# Patient Record
Sex: Female | Born: 1960 | Race: Black or African American | Hispanic: No | Marital: Single | Smoking: Never smoker
Health system: Southern US, Community
[De-identification: ages and names within clinical notes are randomized; demographics above are authoritative.]

## PROBLEM LIST (undated history)

## (undated) DIAGNOSIS — M5030 Other cervical disc degeneration, unspecified cervical region: Secondary | ICD-10-CM

## (undated) DIAGNOSIS — N186 End stage renal disease: Secondary | ICD-10-CM

## (undated) DIAGNOSIS — E538 Deficiency of other specified B group vitamins: Secondary | ICD-10-CM

## (undated) DIAGNOSIS — K219 Gastro-esophageal reflux disease without esophagitis: Secondary | ICD-10-CM

## (undated) DIAGNOSIS — I472 Ventricular tachycardia: Secondary | ICD-10-CM

## (undated) DIAGNOSIS — E1140 Type 2 diabetes mellitus with diabetic neuropathy, unspecified: Secondary | ICD-10-CM

## (undated) DIAGNOSIS — I739 Peripheral vascular disease, unspecified: Secondary | ICD-10-CM

## (undated) DIAGNOSIS — I4729 Other ventricular tachycardia: Secondary | ICD-10-CM

## (undated) DIAGNOSIS — G4733 Obstructive sleep apnea (adult) (pediatric): Secondary | ICD-10-CM

## (undated) DIAGNOSIS — M51379 Other intervertebral disc degeneration, lumbosacral region without mention of lumbar back pain or lower extremity pain: Secondary | ICD-10-CM

## (undated) DIAGNOSIS — G5620 Lesion of ulnar nerve, unspecified upper limb: Secondary | ICD-10-CM

## (undated) DIAGNOSIS — E119 Type 2 diabetes mellitus without complications: Secondary | ICD-10-CM

## (undated) DIAGNOSIS — M5137 Other intervertebral disc degeneration, lumbosacral region: Secondary | ICD-10-CM

## (undated) DIAGNOSIS — I639 Cerebral infarction, unspecified: Secondary | ICD-10-CM

## (undated) DIAGNOSIS — Z992 Dependence on renal dialysis: Secondary | ICD-10-CM

---

## 2014-05-04 DIAGNOSIS — E039 Hypothyroidism, unspecified: Secondary | ICD-10-CM | POA: Insufficient documentation

## 2014-05-04 DIAGNOSIS — E1069 Type 1 diabetes mellitus with other specified complication: Secondary | ICD-10-CM | POA: Insufficient documentation

## 2014-05-04 DIAGNOSIS — E669 Obesity, unspecified: Secondary | ICD-10-CM | POA: Insufficient documentation

## 2014-05-04 DIAGNOSIS — I10 Essential (primary) hypertension: Secondary | ICD-10-CM | POA: Insufficient documentation

## 2014-05-04 DIAGNOSIS — E785 Hyperlipidemia, unspecified: Secondary | ICD-10-CM | POA: Insufficient documentation

## 2014-05-04 DIAGNOSIS — N184 Chronic kidney disease, stage 4 (severe): Secondary | ICD-10-CM | POA: Insufficient documentation

## 2017-06-25 DIAGNOSIS — J984 Other disorders of lung: Secondary | ICD-10-CM

## 2017-06-25 DIAGNOSIS — J189 Pneumonia, unspecified organism: Secondary | ICD-10-CM

## 2017-06-25 HISTORY — DX: Other disorders of lung: J98.4

## 2017-06-25 HISTORY — DX: Pneumonia, unspecified organism: J18.9

## 2018-11-21 DIAGNOSIS — G4733 Obstructive sleep apnea (adult) (pediatric): Secondary | ICD-10-CM | POA: Insufficient documentation

## 2018-12-19 NOTE — Telephone Encounter (Signed)
 CB list for colon on 7/30.   Electronically signed by: Clem Norleen Sero, MD 12/19/18 [phone]

## 2019-09-25 DIAGNOSIS — E119 Type 2 diabetes mellitus without complications: Secondary | ICD-10-CM | POA: Insufficient documentation

## 2020-07-11 ENCOUNTER — Encounter (HOSPITAL_COMMUNITY): Payer: Self-pay | Admitting: Neurology

## 2020-07-11 ENCOUNTER — Emergency Department (HOSPITAL_COMMUNITY): Payer: Medicare Other

## 2020-07-11 ENCOUNTER — Encounter (HOSPITAL_COMMUNITY)
Admission: EM | Disposition: A | Discharge status: Rehabilitation Facility | Payer: Self-pay | Source: Home / Self Care | Attending: Neurology

## 2020-07-11 ENCOUNTER — Emergency Department (HOSPITAL_COMMUNITY): Payer: Medicare Other | Admitting: Anesthesiology

## 2020-07-11 ENCOUNTER — Inpatient Hospital Stay (HOSPITAL_COMMUNITY)
Admission: EM | Admit: 2020-07-11 | Discharge: 2020-07-18 | DRG: 023 | Disposition: A | Discharge status: Rehabilitation Facility | Payer: Medicare Other | Attending: Neurology | Admitting: Neurology

## 2020-07-11 DIAGNOSIS — E1169 Type 2 diabetes mellitus with other specified complication: Secondary | ICD-10-CM | POA: Diagnosis not present

## 2020-07-11 DIAGNOSIS — E1151 Type 2 diabetes mellitus with diabetic peripheral angiopathy without gangrene: Secondary | ICD-10-CM | POA: Diagnosis present

## 2020-07-11 DIAGNOSIS — Z20822 Contact with and (suspected) exposure to covid-19: Secondary | ICD-10-CM | POA: Diagnosis present

## 2020-07-11 DIAGNOSIS — R75 Inconclusive laboratory evidence of human immunodeficiency virus [HIV]: Secondary | ICD-10-CM | POA: Diagnosis not present

## 2020-07-11 DIAGNOSIS — I63432 Cerebral infarction due to embolism of left posterior cerebral artery: Secondary | ICD-10-CM | POA: Diagnosis present

## 2020-07-11 DIAGNOSIS — E11649 Type 2 diabetes mellitus with hypoglycemia without coma: Secondary | ICD-10-CM | POA: Diagnosis not present

## 2020-07-11 DIAGNOSIS — E669 Obesity, unspecified: Secondary | ICD-10-CM | POA: Diagnosis not present

## 2020-07-11 DIAGNOSIS — I69391 Dysphagia following cerebral infarction: Secondary | ICD-10-CM | POA: Diagnosis not present

## 2020-07-11 DIAGNOSIS — G4733 Obstructive sleep apnea (adult) (pediatric): Secondary | ICD-10-CM | POA: Diagnosis present

## 2020-07-11 DIAGNOSIS — I63512 Cerebral infarction due to unspecified occlusion or stenosis of left middle cerebral artery: Secondary | ICD-10-CM | POA: Diagnosis not present

## 2020-07-11 DIAGNOSIS — I639 Cerebral infarction, unspecified: Secondary | ICD-10-CM

## 2020-07-11 DIAGNOSIS — I619 Nontraumatic intracerebral hemorrhage, unspecified: Secondary | ICD-10-CM | POA: Diagnosis not present

## 2020-07-11 DIAGNOSIS — E1122 Type 2 diabetes mellitus with diabetic chronic kidney disease: Secondary | ICD-10-CM | POA: Diagnosis present

## 2020-07-11 DIAGNOSIS — G8191 Hemiplegia, unspecified affecting right dominant side: Secondary | ICD-10-CM | POA: Diagnosis present

## 2020-07-11 DIAGNOSIS — I63132 Cerebral infarction due to embolism of left carotid artery: Secondary | ICD-10-CM | POA: Diagnosis not present

## 2020-07-11 DIAGNOSIS — B370 Candidal stomatitis: Secondary | ICD-10-CM | POA: Diagnosis present

## 2020-07-11 DIAGNOSIS — H5462 Unqualified visual loss, left eye, normal vision right eye: Secondary | ICD-10-CM | POA: Diagnosis present

## 2020-07-11 DIAGNOSIS — D649 Anemia, unspecified: Secondary | ICD-10-CM | POA: Diagnosis present

## 2020-07-11 DIAGNOSIS — E039 Hypothyroidism, unspecified: Secondary | ICD-10-CM | POA: Diagnosis present

## 2020-07-11 DIAGNOSIS — I4892 Unspecified atrial flutter: Secondary | ICD-10-CM | POA: Diagnosis present

## 2020-07-11 DIAGNOSIS — E538 Deficiency of other specified B group vitamins: Secondary | ICD-10-CM | POA: Diagnosis present

## 2020-07-11 DIAGNOSIS — I69351 Hemiplegia and hemiparesis following cerebral infarction affecting right dominant side: Secondary | ICD-10-CM | POA: Diagnosis not present

## 2020-07-11 DIAGNOSIS — R29810 Facial weakness: Secondary | ICD-10-CM | POA: Diagnosis present

## 2020-07-11 DIAGNOSIS — Z1389 Encounter for screening for other disorder: Secondary | ICD-10-CM

## 2020-07-11 DIAGNOSIS — E1142 Type 2 diabetes mellitus with diabetic polyneuropathy: Secondary | ICD-10-CM | POA: Diagnosis present

## 2020-07-11 DIAGNOSIS — Z950 Presence of cardiac pacemaker: Secondary | ICD-10-CM | POA: Diagnosis not present

## 2020-07-11 DIAGNOSIS — E875 Hyperkalemia: Secondary | ICD-10-CM | POA: Diagnosis present

## 2020-07-11 DIAGNOSIS — I63412 Cerebral infarction due to embolism of left middle cerebral artery: Secondary | ICD-10-CM | POA: Diagnosis present

## 2020-07-11 DIAGNOSIS — N2581 Secondary hyperparathyroidism of renal origin: Secondary | ICD-10-CM | POA: Diagnosis present

## 2020-07-11 DIAGNOSIS — Z8249 Family history of ischemic heart disease and other diseases of the circulatory system: Secondary | ICD-10-CM | POA: Diagnosis not present

## 2020-07-11 DIAGNOSIS — Z794 Long term (current) use of insulin: Secondary | ICD-10-CM

## 2020-07-11 DIAGNOSIS — R471 Dysarthria and anarthria: Secondary | ICD-10-CM | POA: Diagnosis present

## 2020-07-11 DIAGNOSIS — I6340 Cerebral infarction due to embolism of unspecified cerebral artery: Secondary | ICD-10-CM

## 2020-07-11 DIAGNOSIS — Z823 Family history of stroke: Secondary | ICD-10-CM

## 2020-07-11 DIAGNOSIS — Z87891 Personal history of nicotine dependence: Secondary | ICD-10-CM | POA: Diagnosis not present

## 2020-07-11 DIAGNOSIS — R404 Transient alteration of awareness: Secondary | ICD-10-CM | POA: Diagnosis not present

## 2020-07-11 DIAGNOSIS — Z8659 Personal history of other mental and behavioral disorders: Secondary | ICD-10-CM | POA: Diagnosis not present

## 2020-07-11 DIAGNOSIS — I611 Nontraumatic intracerebral hemorrhage in hemisphere, cortical: Secondary | ICD-10-CM | POA: Diagnosis not present

## 2020-07-11 DIAGNOSIS — R001 Bradycardia, unspecified: Secondary | ICD-10-CM | POA: Diagnosis not present

## 2020-07-11 DIAGNOSIS — R4701 Aphasia: Secondary | ICD-10-CM | POA: Diagnosis present

## 2020-07-11 DIAGNOSIS — E871 Hypo-osmolality and hyponatremia: Secondary | ICD-10-CM | POA: Diagnosis present

## 2020-07-11 DIAGNOSIS — Z6839 Body mass index (BMI) 39.0-39.9, adult: Secondary | ICD-10-CM | POA: Diagnosis not present

## 2020-07-11 DIAGNOSIS — I69320 Aphasia following cerebral infarction: Secondary | ICD-10-CM | POA: Diagnosis present

## 2020-07-11 DIAGNOSIS — Z0189 Encounter for other specified special examinations: Secondary | ICD-10-CM

## 2020-07-11 DIAGNOSIS — R1310 Dysphagia, unspecified: Secondary | ICD-10-CM | POA: Diagnosis present

## 2020-07-11 DIAGNOSIS — N186 End stage renal disease: Secondary | ICD-10-CM | POA: Diagnosis present

## 2020-07-11 DIAGNOSIS — E785 Hyperlipidemia, unspecified: Secondary | ICD-10-CM | POA: Diagnosis present

## 2020-07-11 DIAGNOSIS — Z23 Encounter for immunization: Secondary | ICD-10-CM | POA: Diagnosis not present

## 2020-07-11 DIAGNOSIS — Z8261 Family history of arthritis: Secondary | ICD-10-CM

## 2020-07-11 DIAGNOSIS — Z79899 Other long term (current) drug therapy: Secondary | ICD-10-CM | POA: Diagnosis not present

## 2020-07-11 DIAGNOSIS — I120 Hypertensive chronic kidney disease with stage 5 chronic kidney disease or end stage renal disease: Secondary | ICD-10-CM | POA: Diagnosis present

## 2020-07-11 DIAGNOSIS — R29724 NIHSS score 24: Secondary | ICD-10-CM | POA: Diagnosis present

## 2020-07-11 DIAGNOSIS — I10 Essential (primary) hypertension: Secondary | ICD-10-CM | POA: Diagnosis not present

## 2020-07-11 DIAGNOSIS — Z992 Dependence on renal dialysis: Secondary | ICD-10-CM

## 2020-07-11 DIAGNOSIS — Z79890 Hormone replacement therapy (postmenopausal): Secondary | ICD-10-CM

## 2020-07-11 DIAGNOSIS — Z6841 Body Mass Index (BMI) 40.0 and over, adult: Secondary | ICD-10-CM | POA: Diagnosis not present

## 2020-07-11 DIAGNOSIS — Z833 Family history of diabetes mellitus: Secondary | ICD-10-CM

## 2020-07-11 DIAGNOSIS — I6389 Other cerebral infarction: Secondary | ICD-10-CM | POA: Diagnosis not present

## 2020-07-11 DIAGNOSIS — I483 Typical atrial flutter: Secondary | ICD-10-CM | POA: Diagnosis not present

## 2020-07-11 DIAGNOSIS — E1165 Type 2 diabetes mellitus with hyperglycemia: Secondary | ICD-10-CM | POA: Diagnosis present

## 2020-07-11 DIAGNOSIS — I495 Sick sinus syndrome: Secondary | ICD-10-CM | POA: Diagnosis present

## 2020-07-11 HISTORY — PX: IR PERCUTANEOUS ART THROMBECTOMY/INFUSION INTRACRANIAL INC DIAG ANGIO: IMG6087

## 2020-07-11 HISTORY — DX: Lesion of ulnar nerve, unspecified upper limb: G56.20

## 2020-07-11 HISTORY — DX: Type 2 diabetes mellitus with diabetic neuropathy, unspecified: E11.40

## 2020-07-11 HISTORY — DX: Gastro-esophageal reflux disease without esophagitis: K21.9

## 2020-07-11 HISTORY — DX: End stage renal disease: N18.6

## 2020-07-11 HISTORY — DX: Dependence on renal dialysis: Z99.2

## 2020-07-11 HISTORY — DX: Other cervical disc degeneration, unspecified cervical region: M50.30

## 2020-07-11 HISTORY — DX: Ventricular tachycardia: I47.2

## 2020-07-11 HISTORY — DX: Other ventricular tachycardia: I47.29

## 2020-07-11 HISTORY — DX: Type 2 diabetes mellitus without complications: E11.9

## 2020-07-11 HISTORY — PX: IR CT HEAD LTD: IMG2386

## 2020-07-11 HISTORY — DX: Other intervertebral disc degeneration, lumbosacral region: M51.37

## 2020-07-11 HISTORY — DX: Deficiency of other specified B group vitamins: E53.8

## 2020-07-11 HISTORY — DX: Obstructive sleep apnea (adult) (pediatric): G47.33

## 2020-07-11 HISTORY — PX: IR ANGIO VERTEBRAL SEL VERTEBRAL UNI L MOD SED: IMG5367

## 2020-07-11 HISTORY — DX: Other intervertebral disc degeneration, lumbosacral region without mention of lumbar back pain or lower extremity pain: M51.379

## 2020-07-11 HISTORY — DX: Peripheral vascular disease, unspecified: I73.9

## 2020-07-11 HISTORY — PX: RADIOLOGY WITH ANESTHESIA: SHX6223

## 2020-07-11 LAB — I-STAT CHEM 8, ED
BUN: 28 mg/dL — ABNORMAL HIGH (ref 8–23)
Calcium, Ion: 1.01 mmol/L — ABNORMAL LOW (ref 1.15–1.40)
Chloride: 98 mmol/L (ref 98–111)
Creatinine, Ser: 6.1 mg/dL — ABNORMAL HIGH (ref 0.44–1.00)
Glucose, Bld: 320 mg/dL — ABNORMAL HIGH (ref 70–99)
HCT: 41 % (ref 36.0–46.0)
Hemoglobin: 13.9 g/dL (ref 12.0–15.0)
Potassium: 5.9 mmol/L — ABNORMAL HIGH (ref 3.5–5.1)
Sodium: 133 mmol/L — ABNORMAL LOW (ref 135–145)
TCO2: 28 mmol/L (ref 22–32)

## 2020-07-11 LAB — APTT: aPTT: 28 seconds (ref 24–36)

## 2020-07-11 LAB — DIFFERENTIAL
Abs Immature Granulocytes: 0.03 10*3/uL (ref 0.00–0.07)
Basophils Absolute: 0 10*3/uL (ref 0.0–0.1)
Basophils Relative: 1 %
Eosinophils Absolute: 0.2 10*3/uL (ref 0.0–0.5)
Eosinophils Relative: 3 %
Immature Granulocytes: 1 %
Lymphocytes Relative: 27 %
Lymphs Abs: 1.8 10*3/uL (ref 0.7–4.0)
Monocytes Absolute: 1.1 10*3/uL — ABNORMAL HIGH (ref 0.1–1.0)
Monocytes Relative: 18 %
Neutro Abs: 3.3 10*3/uL (ref 1.7–7.7)
Neutrophils Relative %: 50 %

## 2020-07-11 LAB — COMPREHENSIVE METABOLIC PANEL
ALT: 17 U/L (ref 0–44)
AST: 25 U/L (ref 15–41)
Albumin: 4.2 g/dL (ref 3.5–5.0)
Alkaline Phosphatase: 94 U/L (ref 38–126)
Anion gap: 13 (ref 5–15)
BUN: 22 mg/dL (ref 8–23)
CO2: 24 mmol/L (ref 22–32)
Calcium: 9.1 mg/dL (ref 8.9–10.3)
Chloride: 96 mmol/L — ABNORMAL LOW (ref 98–111)
Creatinine, Ser: 6.16 mg/dL — ABNORMAL HIGH (ref 0.44–1.00)
GFR, Estimated: 7 mL/min — ABNORMAL LOW (ref >60)
Glucose, Bld: 318 mg/dL — ABNORMAL HIGH (ref 70–99)
Potassium: 5.9 mmol/L — ABNORMAL HIGH (ref 3.5–5.1)
Sodium: 133 mmol/L — ABNORMAL LOW (ref 135–145)
Total Bilirubin: 0.2 mg/dL — ABNORMAL LOW (ref 0.3–1.2)
Total Protein: 8 g/dL (ref 6.5–8.1)

## 2020-07-11 LAB — CBC
HCT: 40.9 % (ref 36.0–46.0)
Hemoglobin: 12.6 g/dL (ref 12.0–15.0)
MCH: 30.6 pg (ref 26.0–34.0)
MCHC: 30.8 g/dL (ref 30.0–36.0)
MCV: 99.3 fL (ref 80.0–100.0)
Platelets: 179 10*3/uL (ref 150–400)
RBC: 4.12 MIL/uL (ref 3.87–5.11)
RDW: 15.7 % — ABNORMAL HIGH (ref 11.5–15.5)
WBC: 6.4 10*3/uL (ref 4.0–10.5)
nRBC: 0 % (ref 0.0–0.2)

## 2020-07-11 LAB — RESP PANEL BY RT-PCR (FLU A&B, COVID) ARPGX2
Influenza A by PCR: NEGATIVE
Influenza B by PCR: NEGATIVE
SARS Coronavirus 2 by RT PCR: NEGATIVE

## 2020-07-11 LAB — PROTIME-INR
INR: 1 (ref 0.8–1.2)
Prothrombin Time: 12.7 seconds (ref 11.4–15.2)

## 2020-07-11 LAB — RENAL FUNCTION PANEL
Albumin: 3.4 g/dL — ABNORMAL LOW (ref 3.5–5.0)
Anion gap: 15 (ref 5–15)
BUN: 27 mg/dL — ABNORMAL HIGH (ref 8–23)
CO2: 18 mmol/L — ABNORMAL LOW (ref 22–32)
Calcium: 8 mg/dL — ABNORMAL LOW (ref 8.9–10.3)
Chloride: 97 mmol/L — ABNORMAL LOW (ref 98–111)
Creatinine, Ser: 6.32 mg/dL — ABNORMAL HIGH (ref 0.44–1.00)
GFR, Estimated: 7 mL/min — ABNORMAL LOW (ref >60)
Glucose, Bld: 488 mg/dL — ABNORMAL HIGH (ref 70–99)
Phosphorus: 3.7 mg/dL (ref 2.5–4.6)
Potassium: 6 mmol/L — ABNORMAL HIGH (ref 3.5–5.1)
Sodium: 130 mmol/L — ABNORMAL LOW (ref 135–145)

## 2020-07-11 LAB — MRSA PCR SCREENING: MRSA by PCR: NEGATIVE

## 2020-07-11 LAB — GLUCOSE, CAPILLARY
Glucose-Capillary: 447 mg/dL — ABNORMAL HIGH (ref 70–99)
Glucose-Capillary: 371 mg/dL — ABNORMAL HIGH (ref 70–99)

## 2020-07-11 LAB — CBG MONITORING, ED: Glucose-Capillary: 309 mg/dL — ABNORMAL HIGH (ref 70–99)

## 2020-07-11 SURGERY — IR WITH ANESTHESIA
Anesthesia: General

## 2020-07-11 MED ORDER — SODIUM CHLORIDE 0.9% FLUSH: 3.0000 mL | Freq: Once | INTRAVENOUS | Status: DC

## 2020-07-11 MED ORDER — INSULIN ASPART 100 UNIT/ML ~~LOC~~ SOLN
0.0000 [IU] | SUBCUTANEOUS | Status: DC
  Administered 2020-07-11 (×2): 15 [IU] via SUBCUTANEOUS
  Administered 2020-07-12: 3 [IU] via SUBCUTANEOUS

## 2020-07-11 MED ORDER — ACETAMINOPHEN 650 MG RE SUPP
650.0000 mg | RECTAL | Status: DC | PRN
  Administered 2020-07-11: 17:00:00 650 mg via RECTAL
  Filled 2020-07-11: qty 1

## 2020-07-11 MED ORDER — SODIUM CHLORIDE 0.9 % IV SOLN: INTRAVENOUS | Status: DC

## 2020-07-11 MED ORDER — SENNOSIDES-DOCUSATE SODIUM 8.6-50 MG PO TABS: 1.0000 | ORAL_TABLET | Freq: Every evening | ORAL | Status: DC | PRN

## 2020-07-11 MED ORDER — STROKE: EARLY STAGES OF RECOVERY BOOK
Freq: Once | Status: AC
  Filled 2020-07-11: qty 1

## 2020-07-11 MED ORDER — ACETAMINOPHEN 325 MG PO TABS
650.0000 mg | ORAL_TABLET | ORAL | Status: DC | PRN
  Administered 2020-07-14: 650 mg via ORAL
  Filled 2020-07-11: qty 2

## 2020-07-11 MED ORDER — ATORVASTATIN CALCIUM 80 MG PO TABS
80.0000 mg | ORAL_TABLET | Freq: Every day | ORAL | Status: DC
  Administered 2020-07-12 – 2020-07-18 (×7): 80 mg via ORAL
  Filled 2020-07-11: qty 1
  Filled 2020-07-11: qty 8
  Filled 2020-07-11 (×3): qty 1
  Filled 2020-07-11: qty 8
  Filled 2020-07-11: qty 1

## 2020-07-11 MED ORDER — FENTANYL CITRATE (PF) 100 MCG/2ML IJ SOLN
INTRAMUSCULAR | Status: AC
  Filled 2020-07-11: qty 2

## 2020-07-11 MED ORDER — VERAPAMIL HCL 2.5 MG/ML IV SOLN
INTRAVENOUS | Status: AC
  Filled 2020-07-11: qty 4

## 2020-07-11 MED ORDER — PHENYLEPHRINE 40 MCG/ML (10ML) SYRINGE FOR IV PUSH (FOR BLOOD PRESSURE SUPPORT)
PREFILLED_SYRINGE | INTRAVENOUS | Status: DC | PRN
  Administered 2020-07-11: 40 ug via INTRAVENOUS

## 2020-07-11 MED ORDER — IOHEXOL 240 MG/ML SOLN
INTRAMUSCULAR | Status: AC
  Filled 2020-07-11: qty 100

## 2020-07-11 MED ORDER — EPHEDRINE SULFATE-NACL 50-0.9 MG/10ML-% IV SOSY
PREFILLED_SYRINGE | INTRAVENOUS | Status: DC | PRN
  Administered 2020-07-11: 10 mg via INTRAVENOUS
  Administered 2020-07-11: 20 mg via INTRAVENOUS
  Administered 2020-07-11: 10 mg via INTRAVENOUS
  Administered 2020-07-11: 5 mg via INTRAVENOUS
  Administered 2020-07-11: 15 mg via INTRAVENOUS

## 2020-07-11 MED ORDER — IOHEXOL 350 MG/ML SOLN
75.0000 mL | Freq: Once | INTRAVENOUS | Status: AC | PRN
  Administered 2020-07-11: 75 mL via INTRAVENOUS

## 2020-07-11 MED ORDER — ROCURONIUM BROMIDE 10 MG/ML (PF) SYRINGE
PREFILLED_SYRINGE | INTRAVENOUS | Status: DC | PRN
  Administered 2020-07-11: 70 mg via INTRAVENOUS

## 2020-07-11 MED ORDER — SUGAMMADEX SODIUM 200 MG/2ML IV SOLN
INTRAVENOUS | Status: DC | PRN
  Administered 2020-07-11: 400 mg via INTRAVENOUS

## 2020-07-11 MED ORDER — ACETAMINOPHEN 160 MG/5ML PO SOLN: 650.0000 mg | ORAL | Status: DC | PRN

## 2020-07-11 MED ORDER — LIDOCAINE 2% (20 MG/ML) 5 ML SYRINGE
INTRAMUSCULAR | Status: DC | PRN
  Administered 2020-07-11: 100 mg via INTRAVENOUS

## 2020-07-11 MED ORDER — CHLORHEXIDINE GLUCONATE CLOTH 2 % EX PADS
6.0000 | MEDICATED_PAD | Freq: Every day | CUTANEOUS | Status: DC
  Administered 2020-07-11 – 2020-07-18 (×8): 6 via TOPICAL

## 2020-07-11 MED ORDER — CLEVIDIPINE BUTYRATE 0.5 MG/ML IV EMUL
0.0000 mg/h | INTRAVENOUS | Status: DC
  Administered 2020-07-11: 1 mg/h via INTRAVENOUS
  Filled 2020-07-11: qty 50

## 2020-07-11 MED ORDER — AMITRIPTYLINE HCL 25 MG PO TABS
25.0000 mg | ORAL_TABLET | Freq: Every day | ORAL | Status: DC
  Administered 2020-07-12 – 2020-07-17 (×6): 25 mg via ORAL
  Filled 2020-07-11 (×6): qty 1

## 2020-07-11 MED ORDER — HEPARIN SODIUM (PORCINE) 5000 UNIT/ML IJ SOLN
5000.0000 [IU] | Freq: Three times a day (TID) | INTRAMUSCULAR | Status: DC
  Administered 2020-07-11 – 2020-07-12 (×3): 5000 [IU] via SUBCUTANEOUS
  Filled 2020-07-11 (×3): qty 1

## 2020-07-11 MED ORDER — PROPOFOL 10 MG/ML IV BOLUS
INTRAVENOUS | Status: DC | PRN
Start: 2020-07-11 — End: 2020-07-11
  Administered 2020-07-11: 100 mg via INTRAVENOUS

## 2020-07-11 MED ORDER — SODIUM CHLORIDE 0.9 % IV SOLN: INTRAVENOUS | Status: DC | PRN

## 2020-07-11 MED ORDER — GLYCOPYRROLATE PF 0.2 MG/ML IJ SOSY
PREFILLED_SYRINGE | INTRAMUSCULAR | Status: DC | PRN
  Administered 2020-07-11 (×2): .2 mg via INTRAVENOUS

## 2020-07-11 MED ORDER — IOHEXOL 240 MG/ML SOLN
150.0000 mL | Freq: Once | INTRAMUSCULAR | Status: AC | PRN
  Administered 2020-07-11: 45 mL via INTRAVENOUS

## 2020-07-11 NOTE — Anesthesia Postprocedure Evaluation (Signed)
Anesthesia Post Note  Patient: Rachael Herrera  Procedure(s) Performed: IR WITH ANESTHESIA (N/A )     Patient location during evaluation: ICU Anesthesia Type: General Level of consciousness: lethargic Pain management: pain level controlled Vital Signs Assessment: post-procedure vital signs reviewed and stable Respiratory status: spontaneous breathing, nonlabored ventilation, respiratory function stable and patient connected to nasal cannula oxygen Cardiovascular status: blood pressure returned to baseline and stable Postop Assessment: no apparent nausea or vomiting Anesthetic complications: no   No complications documented.  Last Vitals:  Vitals:   07/11/20 1645 07/11/20 1700  BP:  (!) 145/73  Pulse: 100 95  Resp: 13 14  Temp:    SpO2: 100% 100%    Last Pain:  Vitals:   07/11/20 1616  TempSrc: Oral                 [name]

## 2020-07-11 NOTE — Sedation Documentation (Signed)
Left femoral sheath removed, 8Fr angioseal deployed to left groin at 1542.

## 2020-07-11 NOTE — Progress Notes (Signed)
Contacted Dr. Charlsie Merles for CBG/SSI orders. VO taken and entered.

## 2020-07-11 NOTE — Consult Note (Signed)
Neurology Consultation Reason for Consult: stroke alert Referring Physician: ED  CC: stroke alert  History is obtained from: EMS  HPI: Rachael Herrera is a 60 y.o. female who was LKW at dialysis today. She drove herself to dialysis and then back home again. Soon thereafter, she was noted to have right sided weakness with gaze preference and inability to communicate. EMS was summoned and she was brought in under stroke alert. BP in the 170s, glucose OK. Not moving the right side. Of note, she was seen on 07/09/20 in the ED at [facility] for another episode that occurred during dialysis. She became bradycardic and went "blind in the left eye." A CT head was suggested but she left without this test. She had bradycardia and dizziness at the time, perhaps precipitating the spell. Plans were initiated to have her pacemaker replaced. The note from that visit states, "Dr. Myles Rosenthal was consulted and stated that patient was very symptomatic when she got bradycardic this morning a.m. wanted [place] to make sure to possibly get a CT of her head and admit her to the hospital and let Dr. Minna Merritts See her and decide if he wants to put in a pacemaker now. Dr. Minna Merritts agreed to this plan but the patient stated that she is not going to stay in the hospital, and she will call Dr. Talbot Grumbling office and make arrangements for him to redo her pacemaker. Patient states that she is tired, her symptoms have been completely back to normal, she did not make arrangements to stay here today, and she will just follow-up as an outpatient. She also refused to wait to do a CT of the head."   LKW: 8 AM tpa given?: no, out of time window (6 hours, possibly 2 days) Premorbid modified rankin scale: 1 ICH Score: n/a mRS baseline = 1   ROS: Unable to obtain due to altered mental status.   No past medical history on file. HTN, DM2, OSA/CPAP, hypothyroidism, obesity, h/o seizure, AV fistula, breast biopsy, C-section, T&A.  No family  history on file. Unobtainable  Social History:  has no history on file for tobacco use, alcohol use, and drug use. Unobtainable  Exam: Current vital signs: Wt 118.6 kg  Vital signs in last 24 hours: Weight:  [118.6 kg] 118.6 kg (02/17 1300)  Physical Exam  Constitutional: Obese, eyes open, unable to vocalize  Psych: Affect appropriate to situation Eyes: No scleral injection HENT: No OP obstrucion MSK: no joint deformities.  Cardiovascular: Normal rate and regular rhythm.  Respiratory: Effort normal, non-labored breathing, apneic at times. GI: Soft.  No distension. There is no tenderness.  Skin: WDI  Neuro: Mental Status: Patient is awake, alert, mute, not following verbal commands, but will at times follow gestures Cranial Nerves: II: Visual Fields notable for no response to visual threat from the right. Pupils are equal, round, and reactive to light. No RAPD, no Horner's. III,IV, VI: Left gaze preference V: unable to test VII: Facial movement is asymmetric with flattening right > left.  VIII: deferred X: gag reflex intact XI: deferred XII: unable to cooperate Motor: Plegic on the right. Some withdrawal of right UE to pain. No movement in right leg to deep nailbed pressure. Moves left arm/leg well and has 5/5 power in the left arm with no drift. Sensory: Sensation is diminished to noxious stimulation throughout right side, lesser degree on the left Deep Tendon Reflexes: areflexic Plantars: deferred Cerebellar: Unable to test  NIHSS = 24 (right hemiplegia, left gaze,  right face, no response to visual threat from the right, mute, unable to follow commands or answer questions, not responding to deep pain on the right other than slight withdrawal of right arm.)  I have reviewed labs in epic and the results pertinent to this consultation are: Glucose 309, no other labs resulted.  I have reviewed the images obtained:  CT head: subacute (?) left occipital infarct. CTA  head: left MCA bifurcation abnormal with branch occlusion (to my eye). No flow in left PCA.  Impression: Discussed case with interventional radiology and they will take her for thrombectomy. Outside the time window for tPA (LKW 8 AM, 6 hours ago). Multiple vascular risk factors. At high risk of ICH due to subacute left occipital stroke, but probably worth the risk given high NIHSS = 24 (discussed briefly with her daughter).  Recommendations: 1) Mechanical thrombectomy 2) Anti-platelet therapy s/p interventional procedure 3) Risk factor control 4) If good reperfusion achieved, would not recommend permissive HTN s/p procedure. 5) admit to stroke team (Dr. Leonie Man aware) to ICU setting 6) PT/OT/ST/rehab 7) Cardiology consult to replace malfunctioning pacer; EKG telemetry in the interim. 8) Nephrology consult to continue hemodialisis for ESRD 9) SSI for DM2 10) NPO, until assessed/cleared by ST 11) q2h neuro checks, unless more frequent monitoring requested by [place] team.  Thank you.   [name]

## 2020-07-11 NOTE — Anesthesia Preprocedure Evaluation (Signed)
Anesthesia Evaluation  Patient identified by MRN, date of birth, ID band Patient unresponsive    Reviewed: Allergy & Precautions, NPO status , Patient's Chart, lab work & pertinent test results, Unable to perform ROS - Chart review onlyPreop documentation limited or incomplete due to emergent nature of procedure.  Airway Mallampati: II  TM Distance: >3 FB Neck ROM: Full    Dental  (+) Teeth Intact, Dental Advisory Given   Pulmonary sleep apnea ,    Pulmonary exam normal breath sounds clear to auscultation       Cardiovascular hypertension, Normal cardiovascular exam Rhythm:Regular Rate:Normal     Neuro/Psych CVA, Residual Symptoms    GI/Hepatic negative GI ROS, Neg liver ROS,   Endo/Other  diabetes, Type 1Hypothyroidism Morbid obesity  Renal/GU ESRF and DialysisRenal disease     Musculoskeletal negative musculoskeletal ROS (+)   Abdominal   Peds  Hematology negative hematology ROS (+)   Anesthesia Other Findings Day of surgery medications reviewed with the patient.  Reproductive/Obstetrics                             Anesthesia Physical Anesthesia Plan  ASA: IV and emergent  Anesthesia Plan: General   Post-op Pain Management:    Induction: Intravenous  PONV Risk Score and Plan: 3 and Treatment may vary due to age or medical condition  Airway Management Planned: Oral ETT  Additional Equipment:   Intra-op Plan:   Post-operative Plan: Post-operative intubation/ventilation  Informed Consent:     Only emergency history available and History available from chart only  Plan Discussed with:   Anesthesia Plan Comments: (Emergency procedure. Pre-op eval completed after chart review and induction due to patient being obtunded.)        Anesthesia Quick Evaluation

## 2020-07-11 NOTE — Code Documentation (Incomplete Revision)
Pt is a 60 yr old female know to be well this morning at 0800. She went to dialysis, drove home, and at some point became unable to speak or move the right side. Daughter called EMS. Pt arrived to [facility] at 1350.Still mute and plegic on left . See flowsheet for timeline and NIHSS details. To CT at 1355. CTNC neg for hemorrhage. IV thrombolytic not given as pt was outside of the treatment window (LKN 0800). CTA performed. Per Dr Charlsie Merles, pt LVO positive. [facility] called at 1429 to call Code IR. Pt taken to bay [facility] at 1430. Report given to IR staff Shonda. Dr Jearld Fenton spoke with the pt's daughter Kateri Fontan, who gave verbal consent for the procedure. Pt to IR suite at 1448.

## 2020-07-11 NOTE — ED Provider Notes (Signed)
[facility] EMERGENCY DEPARTMENT Provider Note   CSN: [id number] Arrival date & time: 07/11/20  1349     History No chief complaint on file.   KAMYLA OLEJNIK is a 60 y.o. female.  HPI  Level 5 caveat due to decreased mental status. Patient presented as a code stroke.  Met [place] the bridge by neurology and myself.  Came in with likely stroke.  Had been called code stroke.  Last definitely normal was reportedly at 8 AM this morning.  Drove herself to dialysis and then drove herself home.  Later seen by daughter and not moving right side and much decreased verbal.  Not on anticoagulation.CBG of almost 300 per EMS.  Blood pressure also somewhat elevated.  No past medical history on file.  Patient Active Problem List   Diagnosis Date Noted  . Cerebral embolism with cerebral infarction 07/11/2020  . Stroke ([place]) 07/11/2020  . Comprehensive diabetic foot examination, type 2 DM, encounter for ([place]) 09/25/2019  . OSA (obstructive sleep apnea) 11/21/2018  . Acquired hypothyroidism 05/04/2014  . Chronic kidney disease, stage IV (severe) ([place]) 05/04/2014  . Essential hypertension 05/04/2014  . Hyperlipidemia due to type 1 diabetes mellitus ([place]) 05/04/2014  . Morbid obesity ([place]) 05/04/2014       OB History   No obstetric history on file.     No family history on file.     Home Medications Prior to Admission medications   Medication Sig Start Date End Date Taking? Authorizing Provider  amitriptyline (ELAVIL) 25 MG tablet Take 25 mg by mouth at bedtime. 06/16/20  Yes [provider]  atorvastatin (LIPITOR) 80 MG tablet Take by mouth. 04/06/19  Yes [provider]  levothyroxine (SYNTHROID) 137 MCG tablet Take by mouth. 01/31/15  Yes [provider]    Allergies    Patient has no known allergies.  Review of Systems   Review of Systems  Unable to perform ROS: Patient nonverbal    Physical Exam Updated Vital Signs Wt 118.6 kg    Physical Exam Vitals and nursing note reviewed.  HENT:     Head: Normocephalic.  Eyes:     General: No scleral icterus.    Comments: Patient with right-sided neglect denies will not cross to right.  Cardiovascular:     Rate and Rhythm: Normal rate.     Heart sounds: Normal heart sounds.  Pulmonary:     Breath sounds: Normal breath sounds.  Abdominal:     Tenderness: There is no abdominal tenderness.  Musculoskeletal:     Cervical back: Neck supple.  Skin:    General: Skin is warm.  Neurological:     Mental Status: She is alert.     Comments: Will follow commands on left.  Will look to left.  However minimally to nonverbal.  Will not move right side.  Also appears to have right-sided neglect.  Complete NIH scoring done by neurology.     ED Results / Procedures / Treatments   Labs (all labs ordered are listed, but only abnormal results are displayed) Labs Reviewed  CBC - Abnormal; Notable for the following components:      Result Value   RDW 15.7 (*)    All other components within normal limits  DIFFERENTIAL - Abnormal; Notable for the following components:   Monocytes Absolute 1.1 (*)    All other components within normal limits  COMPREHENSIVE METABOLIC PANEL - Abnormal; Notable for the following components:   Sodium 133 (*)  Potassium 5.9 (*)    Chloride 96 (*)    Glucose, Bld 318 (*)    Creatinine, Ser 6.16 (*)    Total Bilirubin 0.2 (*)    GFR, Estimated 7 (*)    All other components within normal limits  I-STAT CHEM 8, ED - Abnormal; Notable for the following components:   Sodium 133 (*)    Potassium 5.9 (*)    BUN 28 (*)    Creatinine, Ser 6.10 (*)    Glucose, Bld 320 (*)    Calcium, Ion 1.01 (*)    All other components within normal limits  CBG MONITORING, ED - Abnormal; Notable for the following components:   Glucose-Capillary 309 (*)    All other components within normal limits  RESP PANEL BY RT-PCR (FLU A&B, COVID) ARPGX2  PROTIME-INR  APTT  HIV  ANTIBODY (ROUTINE TESTING W REFLEX)  CBC    EKG None  Radiology CT Code Stroke CTA Head W/WO contrast  Result Date: 07/11/2020 CLINICAL DATA:  Right-sided deficits, slurred speech EXAM: CT HEAD WITHOUT CONTRAST CT ANGIOGRAPHY HEAD AND NECK TECHNIQUE: Multidetector CT imaging of the head was performed without contrast. Multidetector CT imaging of the head and neck was performed using the standard protocol during bolus administration of intravenous contrast. Multiplanar CT image reconstructions and MIPs were obtained to evaluate the vascular anatomy. Carotid stenosis measurements (when applicable) are obtained utilizing NASCET criteria, using the distal internal carotid diameter as the denominator. CONTRAST:  100 mL Omnipaque 350 COMPARISON:  None. FINDINGS: CT HEAD FINDINGS Brain: There is no acute intracranial hemorrhage. Hypoattenuation with loss of gray-white differentiation in the parasagittal left occipital lobe. Possible loss of gray-white differentiation along the body of the left caudate. Ventricles and sulci are within normal limits in size and configuration. Vascular: No hyperdense vessel. Skull: Unremarkable. Sinuses/Orbits: Paranasal sinus mucosal thickening. No significant orbital abnormality. Other: Mastoid air cells are clear. ASPECTS ([place] Stroke Program Early CT Score) - Ganglionic level infarction (caudate, lentiform nuclei, internal capsule, insula, M1-M3 cortex): 6 - Supraganglionic infarction (M4-M6 cortex): 3 Total score (0-10 with 10 being normal): 9 Review of the MIP images confirms the above findings CTA NECK FINDINGS Aortic arch: Mild calcified plaque along the aortic arch. Patent great vessel origins. Right carotid system: Patent. Calcified plaque at the ICA origin without measurable stenosis. Left carotid system: Patent. Calcified plaque at the ICA origin without measurable stenosis. Vertebral arteries: Patent and codominant. Skeleton: Degenerative changes of the cervical  spine. Other neck: No mass or adenopathy. Upper chest: No apical lung mass. Review of the MIP images confirms the above findings CTA HEAD FINDINGS Anterior circulation: Intracranial internal carotid arteries are patent with calcified plaque causing mild stenosis. Proximal left M1 MCA is patent. There is nonocclusive thrombus at the bifurcation. There is occlusion of a proximal M3 branch. Right middle and both anterior cerebral arteries are patent. Posterior circulation: Intracranial vertebral arteries, basilar artery, and posterior cerebral arteries are patent. A left posterior communicating artery is present. Venous sinuses: As permitted by contrast timing, patent. Review of the MIP images confirms the above findings IMPRESSION: Acute left occipital lobe infarction. Suspected acute infarction of the body of the left caudate. No large vessel occlusion or hemodynamically significant stenosis in the neck. Nonocclusive thrombus at the left MCA bifurcation. Occlusion of a proximal left M3 MCA branch. Patent proximal left PCA. These results were communicated to Dr. Charlsie Merles at 2:25 pm on 07/11/2020 by text page via the [facility] messaging system. Electronically Signed  By: Guadlupe Spanish M.D.   On: 07/11/2020 14:51   CT Code Stroke CTA Neck W/WO contrast  Result Date: 07/11/2020 CLINICAL DATA:  Right-sided deficits, slurred speech EXAM: CT HEAD WITHOUT CONTRAST CT ANGIOGRAPHY HEAD AND NECK TECHNIQUE: Multidetector CT imaging of the head was performed without contrast. Multidetector CT imaging of the head and neck was performed using the standard protocol during bolus administration of intravenous contrast. Multiplanar CT image reconstructions and MIPs were obtained to evaluate the vascular anatomy. Carotid stenosis measurements (when applicable) are obtained utilizing NASCET criteria, using the distal internal carotid diameter as the denominator. CONTRAST:  100 mL Omnipaque 350 COMPARISON:  None. FINDINGS: CT HEAD FINDINGS  Brain: There is no acute intracranial hemorrhage. Hypoattenuation with loss of gray-white differentiation in the parasagittal left occipital lobe. Possible loss of gray-white differentiation along the body of the left caudate. Ventricles and sulci are within normal limits in size and configuration. Vascular: No hyperdense vessel. Skull: Unremarkable. Sinuses/Orbits: Paranasal sinus mucosal thickening. No significant orbital abnormality. Other: Mastoid air cells are clear. ASPECTS ([place] Stroke Program Early CT Score) - Ganglionic level infarction (caudate, lentiform nuclei, internal capsule, insula, M1-M3 cortex): 6 - Supraganglionic infarction (M4-M6 cortex): 3 Total score (0-10 with 10 being normal): 9 Review of the MIP images confirms the above findings CTA NECK FINDINGS Aortic arch: Mild calcified plaque along the aortic arch. Patent great vessel origins. Right carotid system: Patent. Calcified plaque at the ICA origin without measurable stenosis. Left carotid system: Patent. Calcified plaque at the ICA origin without measurable stenosis. Vertebral arteries: Patent and codominant. Skeleton: Degenerative changes of the cervical spine. Other neck: No mass or adenopathy. Upper chest: No apical lung mass. Review of the MIP images confirms the above findings CTA HEAD FINDINGS Anterior circulation: Intracranial internal carotid arteries are patent with calcified plaque causing mild stenosis. Proximal left M1 MCA is patent. There is nonocclusive thrombus at the bifurcation. There is occlusion of a proximal M3 branch. Right middle and both anterior cerebral arteries are patent. Posterior circulation: Intracranial vertebral arteries, basilar artery, and posterior cerebral arteries are patent. A left posterior communicating artery is present. Venous sinuses: As permitted by contrast timing, patent. Review of the MIP images confirms the above findings IMPRESSION: Acute left occipital lobe infarction. Suspected acute  infarction of the body of the left caudate. No large vessel occlusion or hemodynamically significant stenosis in the neck. Nonocclusive thrombus at the left MCA bifurcation. Occlusion of a proximal left M3 MCA branch. Patent proximal left PCA. These results were communicated to Dr. Napoleon Form at 2:25 pm on 07/11/2020 by text page via the [facility] messaging system. Electronically Signed   By: Guadlupe Spanish M.D.   On: 07/11/2020 14:51   CT HEAD CODE STROKE WO CONTRAST  Result Date: 07/11/2020 CLINICAL DATA:  Right-sided deficits, slurred speech EXAM: CT HEAD WITHOUT CONTRAST CT ANGIOGRAPHY HEAD AND NECK TECHNIQUE: Multidetector CT imaging of the head was performed without contrast. Multidetector CT imaging of the head and neck was performed using the standard protocol during bolus administration of intravenous contrast. Multiplanar CT image reconstructions and MIPs were obtained to evaluate the vascular anatomy. Carotid stenosis measurements (when applicable) are obtained utilizing NASCET criteria, using the distal internal carotid diameter as the denominator. CONTRAST:  100 mL Omnipaque 350 COMPARISON:  None. FINDINGS: CT HEAD FINDINGS Brain: There is no acute intracranial hemorrhage. Hypoattenuation with loss of gray-white differentiation in the parasagittal left occipital lobe. Possible loss of gray-white differentiation along the body of the left caudate. Ventricles  and sulci are within normal limits in size and configuration. Vascular: No hyperdense vessel. Skull: Unremarkable. Sinuses/Orbits: Paranasal sinus mucosal thickening. No significant orbital abnormality. Other: Mastoid air cells are clear. ASPECTS ([place] Stroke Program Early CT Score) - Ganglionic level infarction (caudate, lentiform nuclei, internal capsule, insula, M1-M3 cortex): 6 - Supraganglionic infarction (M4-M6 cortex): 3 Total score (0-10 with 10 being normal): 9 Review of the MIP images confirms the above findings CTA NECK FINDINGS Aortic arch:  Mild calcified plaque along the aortic arch. Patent great vessel origins. Right carotid system: Patent. Calcified plaque at the ICA origin without measurable stenosis. Left carotid system: Patent. Calcified plaque at the ICA origin without measurable stenosis. Vertebral arteries: Patent and codominant. Skeleton: Degenerative changes of the cervical spine. Other neck: No mass or adenopathy. Upper chest: No apical lung mass. Review of the MIP images confirms the above findings CTA HEAD FINDINGS Anterior circulation: Intracranial internal carotid arteries are patent with calcified plaque causing mild stenosis. Proximal left M1 MCA is patent. There is nonocclusive thrombus at the bifurcation. There is occlusion of a proximal M3 branch. Right middle and both anterior cerebral arteries are patent. Posterior circulation: Intracranial vertebral arteries, basilar artery, and posterior cerebral arteries are patent. A left posterior communicating artery is present. Venous sinuses: As permitted by contrast timing, patent. Review of the MIP images confirms the above findings IMPRESSION: Acute left occipital lobe infarction. Suspected acute infarction of the body of the left caudate. No large vessel occlusion or hemodynamically significant stenosis in the neck. Nonocclusive thrombus at the left MCA bifurcation. Occlusion of a proximal left M3 MCA branch. Patent proximal left PCA. These results were communicated to Dr. Charlsie Merles at 2:25 pm on 07/11/2020 by text page via the [facility] messaging system. Electronically Signed   By: Macy Mis M.D.   On: 07/11/2020 14:51    Procedures Procedures   Medications Ordered in ED Medications  sodium chloride flush (NS) 0.9 % injection 3 mL (has no administration in time range)  iohexol (OMNIPAQUE) 240 MG/ML injection (has no administration in time range)  verapamil (ISOPTIN) 2.5 MG/ML injection (has no administration in time range)  fentaNYL (SUBLIMAZE) 100 MCG/2ML injection (has no  administration in time range)  iohexol (OMNIPAQUE) 240 MG/ML injection 150 mL (has no administration in time range)  iohexol (OMNIPAQUE) 240 MG/ML injection (has no administration in time range)   stroke: mapping our early stages of recovery book (has no administration in time range)  0.9 %  sodium chloride infusion (has no administration in time range)  acetaminophen (TYLENOL) tablet 650 mg (has no administration in time range)    Or  acetaminophen (TYLENOL) 160 MG/5ML solution 650 mg (has no administration in time range)    Or  acetaminophen (TYLENOL) suppository 650 mg (has no administration in time range)  senna-docusate (Senokot-S) tablet 1 tablet (has no administration in time range)  heparin injection 5,000 Units (has no administration in time range)  amitriptyline (ELAVIL) tablet 25 mg (has no administration in time range)  atorvastatin (LIPITOR) tablet 80 mg (has no administration in time range)  iohexol (OMNIPAQUE) 350 MG/ML injection 75 mL (75 mLs Intravenous Contrast Given 07/11/20 1422)    ED Course  I have reviewed the triage vital signs and the nursing notes.  Pertinent labs & imaging results that were available during my care of the patient were reviewed by me and considered in my medical decision making (see chart for details).    MDM Rules/Calculators/A&P  Patient presented as a code stroke.  Last normal at 8 this morning although reportedly had some vision changes a couple days ago at [facility] but did not have CT scan done since she left AMA.  Reportedly has malfunctioning pacemaker. Initial CTA showed likely occipital stroke.  CTA done showed some obstruction.  Neurology elected to take patient to IR  CRITICAL CARE Performed by: Davonna Belling Total critical care time: 30 minutes Critical care time was exclusive of separately billable procedures and treating other patients. Critical care was necessary to treat or prevent  imminent or life-threatening deterioration. Critical care was time spent personally by me on the following activities: development of treatment plan with patient and/or surrogate as well as nursing, discussions with consultants, evaluation of patient's response to treatment, examination of patient, obtaining history from patient or surrogate, ordering and performing treatments and interventions, ordering and review of laboratory studies, ordering and review of radiographic studies, pulse oximetry and re-evaluation of patient's condition.  Final Clinical Impression(s) / ED Diagnoses Final diagnoses:  Cerebrovascular accident (CVA), unspecified mechanism [facility])    Rx / [place] Orders ED Discharge Orders    None       Davonna Belling, MD 07/11/20 [phone]

## 2020-07-11 NOTE — Progress Notes (Signed)
CBG for 0000 was 371, Kirkpatrick notified, verbal order to continue with sliding scale, and to not begin the insulin drip.

## 2020-07-11 NOTE — Consult Note (Signed)
Rachael Herrera Admit Date: 07/11/2020 07/11/2020 Rachael Herrera Requesting Physician:  Charlsie Merles MD  Reason for Consult:  ESRD, acute CVA HPI:  83F presented to the ER earlier today after dialysis with right-sided hemiplegia, gaze deficit, and question acute CVA.  She went to neuro interventional radiology and underwent mechanical thrombectomy with restoration of flow.  Patient receives dialysis at [facility] with Dr. Joesph July.  Outpt Rx appears to be: 273kg, THS, F200, 3.5h, LUE AVF, ? 3K  By report she underwent dialysis earlier today. K was 5.9 at presentation to ED.    Currently in Neuro ICU.  Awake, not interactive, not verbal.   PMH Incudes:  ESRD  HTN  DM1 or DM2?  Hx/o failed PPM for bradycardia   Creatinine, Ser (mg/dL)  Date Value  07/11/2020 6.10 (H)  07/11/2020 6.16 (H)  ]  ROS Pt unable to contribute ROS  PMH No past medical history on file.b unknown PSH unkown FH No family history on file. SH  has no history on file for tobacco use, alcohol use, and drug use. Allergies No Known Allergies Home medications Prior to Admission medications   Medication Sig Start Date End Date Taking? Authorizing Provider  amitriptyline (ELAVIL) 25 MG tablet Take 25 mg by mouth at bedtime. 06/16/20  Yes [provider]  atorvastatin (LIPITOR) 80 MG tablet Take by mouth. 04/06/19  Yes [provider]  levothyroxine (SYNTHROID) 137 MCG tablet Take by mouth. 01/31/15  Yes [provider]    Current Medications Scheduled Meds: . [MAR Hold]  stroke: mapping our early stages of recovery book   Does not apply Once  . [MAR Hold] amitriptyline  25 mg Oral QHS  . [MAR Hold] atorvastatin  80 mg Oral Daily  . fentaNYL      . [MAR Hold] heparin  5,000 Units Subcutaneous Q8H  . iohexol      . iohexol      . [MAR Hold] sodium chloride flush  3 mL Intravenous Once  . verapamil       Continuous Infusions: . sodium chloride     PRN Meds:.[MAR Hold]  acetaminophen **OR** [MAR Hold] acetaminophen (TYLENOL) oral liquid 160 mg/5 mL **OR** [MAR Hold] acetaminophen, [MAR Hold] senna-docusate  CBC Recent Labs  Lab 07/11/20 1350 07/11/20 1446  WBC 6.4  --   NEUTROABS 3.3  --   HGB 12.6 13.9  HCT 40.9 41.0  MCV 99.3  --   PLT 179  --    Basic Metabolic Panel Recent Labs  Lab 07/11/20 1350 07/11/20 1446  NA 133* 133*  K 5.9* 5.9*  CL 96* 98  CO2 24  --   GLUCOSE 318* 320*  BUN 22 28*  CREATININE 6.16* 6.10*  CALCIUM 9.1  --     Physical Exam  Weight 118.6 kg. GEN: Obese, NAD,  NEURO R sided moter deficit ENT: NCAT CV: RRR PULM: CTAB ABD: snt/nd, limited exam SKIN: no rashes/lesions EXT:LUE AVF +B/T, bandaged   Assessment 8F ESRD [place] ?THS with acute occipital CVA s/p mechanical thrombectomy  1. Acute Occiptal CVA s/p mech thrombectom 07/11/20, per NEURO/IR 2. ESRD, [place], THS, Zekan MD?  3. Hyperkalemia, mild 4. DM2 (some chart hx/o DM1) 5. Morbid Obesity 6. Anemia, stable  Plan 1. Rpt RFP, try to tx K with med mgmt 2. Would like to avoid HD in first 24h post CVA if possible 3. Daily weights, Daily Renal Panel, Strict I/Os, Avoid nephrotoxins (NSAIDs, judicious IV Contrast)  4. Will follow along   Rachael Herrera   07/11/2020, 4:12 PM

## 2020-07-11 NOTE — ED Notes (Signed)
Please call the daug. Doris Crego  she is here in room 15  her number is  [phone]

## 2020-07-11 NOTE — Procedures (Signed)
INTERVENTIONAL NEURORADIOLOGY BRIEF POSTPROCEDURE NOTE  Diagnostic cerebral angiogram and mechanical thrombectomy  Attending: Dr. Pedro Earls   Assistant: None  Diagnosis: Left M2/MCA occlusion  Access site: LCFA 23F  Access closure: 23F angioseal  Anesthesia: General  Medication used: refer to anesthesia documentation.  Complications: None  Estimated blood loss: 30 mL  Specimen: None  Findings: There was a left M2/MCA posterior division branch occlusion. Mechanical thrombectomy performed with combine aspiration and stent retriever. One pass with complete recanalization TICI 3. No complication.  Patient extubated and transferred to ICU.

## 2020-07-11 NOTE — Progress Notes (Signed)
CBC 448, Kirkpatrick notified. Verbal order to go ahead with sliding scale coverage, repeat CBG at 0000, if still high to begin IV insulin.

## 2020-07-11 NOTE — Transfer of Care (Signed)
Immediate Anesthesia Transfer of Care Note  Patient: Rachael Herrera  Procedure(s) Performed: IR WITH ANESTHESIA (N/A )  Patient Location: ICU  Anesthesia Type:General  Level of Consciousness: drowsy, patient cooperative and responds to stimulation  Airway & Oxygen Therapy: Patient Spontanous Breathing  Post-op Assessment: Report given to RN and Post -op Vital signs reviewed and stable  Post vital signs: Reviewed and stable  Last Vitals:  Vitals Value Taken Time  BP 127/61 07/11/20 1616  Temp    Pulse 94 07/11/20 1623  Resp 17 07/11/20 1623  SpO2 100 % 07/11/20 1623  Vitals shown include unvalidated device data.  Last Pain: There were no vitals filed for this visit.       Complications: No complications documented.

## 2020-07-11 NOTE — Anesthesia Procedure Notes (Addendum)
Procedure Name: Intubation Date/Time: 07/11/2020 2:46 PM Performed by: Janace Litten, CRNA Pre-anesthesia Checklist: Patient identified, Emergency Drugs available, Suction available, Patient being monitored and Timeout performed Patient Re-evaluated:Patient Re-evaluated prior to induction Oxygen Delivery Method: Ambu bag Preoxygenation: Pre-oxygenation with 100% oxygen Induction Type: IV induction Ventilation: Two handed mask ventilation required Laryngoscope Size: Glidescope and 3 Grade View: Grade I Tube type: Oral Tube size: 7.0 mm Number of attempts: 1 Airway Equipment and Method: Stylet and Video-laryngoscopy Placement Confirmation: ETT inserted through vocal cords under direct vision,  breath sounds checked- equal and bilateral and CO2 detector Secured at: 21 cm Tube secured with: Tape Dental Injury: Teeth and Oropharynx as per pre-operative assessment

## 2020-07-11 NOTE — Code Documentation (Signed)
Pt is a 60 yr old female know to be well this morning at 0800. She went to dialysis, drove home, and at some point became unable to speak or move the right side. Daughter called EMS. Pt arrived to [facility] at 1350.Still mute and plegic on left . See flowsheet for timeline and NIHSS details. To CT at 1355. CTNC neg for hemorrhage. CTA performed. Per Dr Charlsie Merles, pt LVO positive. [facility] called at 1429 to call Code IR. Pt taken to bay 8 at 1430. Report given to IR staff Shonda. Dr Jearld Fenton spoke with the pt's daughter Tracye Franey, who gave verbal consent for the procedure. Pt to IR suite at 1448.

## 2020-07-11 NOTE — H&P (Signed)
Admission Note  See consult note entered today by me, and this will serve as the H&P.  Rachael Herrera Rachael Herrera

## 2020-07-12 ENCOUNTER — Inpatient Hospital Stay (HOSPITAL_COMMUNITY): Payer: Medicare Other

## 2020-07-12 ENCOUNTER — Encounter (HOSPITAL_COMMUNITY): Payer: Self-pay | Admitting: Radiology

## 2020-07-12 DIAGNOSIS — I6389 Other cerebral infarction: Secondary | ICD-10-CM | POA: Diagnosis not present

## 2020-07-12 DIAGNOSIS — I63412 Cerebral infarction due to embolism of left middle cerebral artery: Principal | ICD-10-CM

## 2020-07-12 HISTORY — PX: IR US GUIDE VASC ACCESS LEFT: IMG2389

## 2020-07-12 LAB — GLUCOSE, CAPILLARY
Glucose-Capillary: 182 mg/dL — ABNORMAL HIGH (ref 70–99)
Glucose-Capillary: 28 mg/dL — CL (ref 70–99)
Glucose-Capillary: 99 mg/dL (ref 70–99)
Glucose-Capillary: 36 mg/dL — CL (ref 70–99)
Glucose-Capillary: 134 mg/dL — ABNORMAL HIGH (ref 70–99)
Glucose-Capillary: 235 mg/dL — ABNORMAL HIGH (ref 70–99)
Glucose-Capillary: 362 mg/dL — ABNORMAL HIGH (ref 70–99)
Glucose-Capillary: 403 mg/dL — ABNORMAL HIGH (ref 70–99)

## 2020-07-12 LAB — ECHOCARDIOGRAM COMPLETE
Area-P 1/2: 4.34 cm2
Calc EF: 58.2 %
S' Lateral: 3.3 cm
Single Plane A2C EF: 65 %
Single Plane A4C EF: 53.7 %
Weight: 4081.16 oz

## 2020-07-12 LAB — COMPREHENSIVE METABOLIC PANEL
ALT: 13 U/L (ref 0–44)
AST: 22 U/L (ref 15–41)
Albumin: 3.6 g/dL (ref 3.5–5.0)
Alkaline Phosphatase: 79 U/L (ref 38–126)
Anion gap: 13 (ref 5–15)
BUN: 30 mg/dL — ABNORMAL HIGH (ref 8–23)
CO2: 23 mmol/L (ref 22–32)
Calcium: 8.4 mg/dL — ABNORMAL LOW (ref 8.9–10.3)
Chloride: 98 mmol/L (ref 98–111)
Creatinine, Ser: 8.13 mg/dL — ABNORMAL HIGH (ref 0.44–1.00)
GFR, Estimated: 5 mL/min — ABNORMAL LOW (ref >60)
Glucose, Bld: 231 mg/dL — ABNORMAL HIGH (ref 70–99)
Potassium: 5.2 mmol/L — ABNORMAL HIGH (ref 3.5–5.1)
Sodium: 134 mmol/L — ABNORMAL LOW (ref 135–145)
Total Bilirubin: 0.9 mg/dL (ref 0.3–1.2)
Total Protein: 6.9 g/dL (ref 6.5–8.1)

## 2020-07-12 LAB — HEMOGLOBIN A1C
Hgb A1c MFr Bld: 8.5 % — ABNORMAL HIGH (ref 4.8–5.6)
Mean Plasma Glucose: 197.25 mg/dL

## 2020-07-12 LAB — TSH: TSH: 10.69 u[IU]/mL — ABNORMAL HIGH (ref 0.350–4.500)

## 2020-07-12 LAB — LIPID PANEL
Cholesterol: 156 mg/dL (ref 0–200)
HDL: 31 mg/dL — ABNORMAL LOW (ref >40)
LDL Cholesterol: 49 mg/dL (ref 0–99)
Total CHOL/HDL Ratio: 5 RATIO
Triglycerides: 382 mg/dL — ABNORMAL HIGH (ref <150)
VLDL: 76 mg/dL — ABNORMAL HIGH (ref 0–40)

## 2020-07-12 LAB — PHOSPHORUS: Phosphorus: 4.4 mg/dL (ref 2.5–4.6)

## 2020-07-12 LAB — T4, FREE: Free T4: 0.55 ng/dL — ABNORMAL LOW (ref 0.61–1.12)

## 2020-07-12 LAB — MAGNESIUM: Magnesium: 1.9 mg/dL (ref 1.7–2.4)

## 2020-07-12 LAB — HEPARIN LEVEL (UNFRACTIONATED): Heparin Unfractionated: 0.52 IU/mL (ref 0.30–0.70)

## 2020-07-12 LAB — HIV ANTIBODY (ROUTINE TESTING W REFLEX): HIV Screen 4th Generation wRfx: REACTIVE — AB

## 2020-07-12 MED ORDER — DEXTROSE 50 % IV SOLN
INTRAVENOUS | Status: AC
  Filled 2020-07-12: qty 50

## 2020-07-12 MED ORDER — DEXTROSE 50 % IV SOLN
1.0000 | Freq: Once | INTRAVENOUS | Status: AC
  Administered 2020-07-12: 50 mL via INTRAVENOUS

## 2020-07-12 MED ORDER — SODIUM ZIRCONIUM CYCLOSILICATE 10 G PO PACK
20.0000 g | PACK | Freq: Once | ORAL | Status: AC
  Administered 2020-07-12: 20 g via ORAL
  Filled 2020-07-12: qty 2

## 2020-07-12 MED ORDER — ORAL CARE MOUTH RINSE
15.0000 mL | Freq: Two times a day (BID) | OROMUCOSAL | Status: DC
  Administered 2020-07-12 – 2020-07-18 (×12): 15 mL via OROMUCOSAL

## 2020-07-12 MED ORDER — CHLORHEXIDINE GLUCONATE CLOTH 2 % EX PADS: 6.0000 | MEDICATED_PAD | Freq: Every day | CUTANEOUS | Status: DC

## 2020-07-12 MED ORDER — RENA-VITE PO TABS
1.0000 | ORAL_TABLET | Freq: Every day | ORAL | Status: DC
  Administered 2020-07-12 – 2020-07-17 (×6): 1 via ORAL
  Filled 2020-07-12 (×7): qty 1

## 2020-07-12 MED ORDER — NEPRO/CARBSTEADY PO LIQD
237.0000 mL | Freq: Three times a day (TID) | ORAL | Status: DC
  Administered 2020-07-12 – 2020-07-17 (×12): 237 mL via ORAL
  Filled 2020-07-12: qty 237

## 2020-07-12 MED ORDER — CINACALCET HCL 30 MG PO TABS
60.0000 mg | ORAL_TABLET | Freq: Every day | ORAL | Status: DC
  Administered 2020-07-13 – 2020-07-18 (×6): 60 mg via ORAL
  Filled 2020-07-12 (×8): qty 2

## 2020-07-12 MED ORDER — HEPARIN (PORCINE) 25000 UT/250ML-% IV SOLN
1350.0000 [IU]/h | INTRAVENOUS | Status: AC
  Administered 2020-07-12: 16:00:00 1100 [IU]/h via INTRAVENOUS
  Administered 2020-07-13: 1150 [IU]/h via INTRAVENOUS
  Administered 2020-07-14: 06:00:00 1250 [IU]/h via INTRAVENOUS
  Administered 2020-07-15: 1350 [IU]/h via INTRAVENOUS
  Administered 2020-07-15: 1200 [IU]/h via INTRAVENOUS
  Administered 2020-07-16: 1350 [IU]/h via INTRAVENOUS
  Filled 2020-07-12 (×8): qty 250

## 2020-07-12 MED ORDER — PERFLUTREN LIPID MICROSPHERE
1.0000 mL | INTRAVENOUS | Status: DC | PRN
  Administered 2020-07-12: 2 mL via INTRAVENOUS
  Filled 2020-07-12: qty 10

## 2020-07-12 MED ORDER — INSULIN ASPART 100 UNIT/ML ~~LOC~~ SOLN
0.0000 [IU] | SUBCUTANEOUS | Status: DC
  Administered 2020-07-12: 6 [IU] via SUBCUTANEOUS
  Administered 2020-07-12: 2 [IU] via SUBCUTANEOUS
  Administered 2020-07-12 – 2020-07-13 (×2): 5 [IU] via SUBCUTANEOUS
  Administered 2020-07-13: 3 [IU] via SUBCUTANEOUS
  Administered 2020-07-13: 1 [IU] via SUBCUTANEOUS
  Administered 2020-07-13 (×2): 4 [IU] via SUBCUTANEOUS
  Administered 2020-07-14: 2 [IU] via SUBCUTANEOUS
  Administered 2020-07-14: 1 [IU] via SUBCUTANEOUS
  Administered 2020-07-14: 2 [IU] via SUBCUTANEOUS
  Administered 2020-07-14: 4 [IU] via SUBCUTANEOUS
  Administered 2020-07-14: 3 [IU] via SUBCUTANEOUS

## 2020-07-12 NOTE — Progress Notes (Signed)
Initial Nutrition Assessment  DOCUMENTATION CODES:   Not applicable  INTERVENTION:    Nepro Shake po TID, each supplement provides 425 kcal and 19 grams protein  Magic cup BID with meals, each supplement provides 290 kcal and 9 grams of protein  Renal MVI daily   NUTRITION DIAGNOSIS:   Increased nutrient needs related to chronic illness (ESRD on HD) as evidenced by estimated needs.  GOAL:   Patient will meet greater than or equal to 90% of their needs  MONITOR:   PO intake,Supplement acceptance,Weight trends,Labs,I & O's,Diet advancement  REASON FOR ASSESSMENT:   Other (Comment) (Cortrak list)    ASSESSMENT:   Patient with PMH significant for HTN, DM, seizures, HLD, and ESRD on HD. Presents this admission with acute CVA.  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 Daughter at beside able to provide some history. Daughter denies patient had decreased appetite PTA. Consumed three meal daily that consisted of good protein sources (unsure of exact meal composition). States at the beginning of this week patient started to try protein supplements but is unsure of the exact brand. Patient originally scheduled for a Cortrak but was able to pass swallow evaluation to dysphagia 1 diet with thin liquids. Discussed the importance of protein intake for preservation of lean body mass. Patient willing to try berry flavor of Nepro.    Patient/daughter unsure of EDW and denies weight loss. Per nephrology her outpatient EDW was 273 lb. Admission weight shows to be at 255 lb. Suspect dry weight loss but able to quantify given fluctuating fluid status.   Medications: sensipar, SS novolog Labs: Na 134 (L) K 5.2 (H) CBG 28-134  NUTRITION - FOCUSED PHYSICAL EXAM:  Flowsheet Row Most Recent Value  Orbital Region No depletion  Upper Arm Region No depletion  Thoracic and Lumbar Region No depletion  Buccal  Region No depletion  Temple Region No depletion  Clavicle Bone Region No depletion  Clavicle and Acromion Bone Region No depletion  Scapular Bone Region No depletion  Dorsal Hand No depletion  Patellar Region Mild depletion  Anterior Thigh Region Mild depletion  Posterior Calf Region Unable to assess  Edema (RD Assessment) Unable to assess  Hair Reviewed  Eyes Reviewed  Mouth Reviewed  Skin Reviewed  Nails Reviewed     Diet Order:   Diet Order            DIET - DYS 1 Room service appropriate? No; Fluid consistency: Thin  Diet effective now                 EDUCATION NEEDS:   Education needs have been addressed  Skin:  Skin Assessment: Skin Integrity Issues: Skin Integrity Issues:: Incisions Incisions: L groin  Last BM:  PTA  Height:   Ht Readings from Last 1 Encounters:  07/12/20 '5\' 7"'$  (1.702 m)    Weight:   Wt Readings from Last 1 Encounters:  07/11/20 115.7 kg    BMI:  Body mass index is 39.95 kg/m.  Estimated Nutritional Needs:   Kcal:  2000-2200 kcal  Protein:  100-120 grams  Fluid:  1000 ml + UOP  Mariana Single RD, LDN Clinical Nutrition Pager listed in [place]

## 2020-07-12 NOTE — Progress Notes (Addendum)
Nephrology Follow-Up Consult note   Assessment/Recommendations: Rachael Herrera is a/an 60 y.o. female with a past medical history significant for ESRD, admitted for CVA after dialysis.     Outpt Records: NEPHROLOGIST: Cay Schillings MD  LOCATION: [facility]  SCHEDULE: T-Th-S 1st Shift  EDW: 273 kg.  KIDNEY: [facility] F200N  LITERS PROC: 73.5 liters/treatment  HD TIME: 210  ACCESS: R IJ PC  NEEDLE SIZE:  ANTICOAG: Standard Heparin Per Protocol  BATH: 3.0CA-HCO3  QB: 350 ml/min  QD: 700 ml/min    ESRD: Continue dialysis based on current prescription.  Will use 2K given hyperkalemia.  Plan for dialysis tomorrow per schedule Hyperkalemia: improved from 6->5.2 with lokelma. Dialysis tomorrow Hypertension/volume: Volume status and BP acceptable. Get to EDW if tolerated Anemia: hgb acceptable at 13.9 Secondary hyperparathyroidism: Restart home sevelamer when taking p.o.  Would continue Sensipar 60 mg daily DM2: mgmt per primary CVA: s/p thrombectomy. Mgmt per primary   Recommendations conveyed to primary service.    [facility] 07/12/2020 1:37 PM  ___________________________________________________________  CC: ESRD  Interval History/Subjective: Patient unable to converse at this time.  Potassium elevated this morning but improved status post Lokelma to 5.2.  Patient now able to tolerate p.o.  Medications:  Current Facility-Administered Medications  Medication Dose Route Frequency Provider Last Rate Last Admin  . 0.9 %  sodium chloride infusion   Intravenous Continuous Absher, Minette Brine, MD 50 mL/hr at 07/12/20 1300 Infusion Verify at 07/12/20 1300  . acetaminophen (TYLENOL) tablet 650 mg  650 mg Oral Q4H PRN Absher, Minette Brine, MD       Or  . acetaminophen (TYLENOL) 160 MG/5ML solution 650 mg  650 mg Per Tube Q4H PRN Absher, Minette Brine, MD       Or  . acetaminophen (TYLENOL) suppository 650 mg  650 mg Rectal Q4H PRN Absher, Minette Brine, MD   650 mg at  07/11/20 1723  . amitriptyline (ELAVIL) tablet 25 mg  25 mg Oral QHS Absher, Minette Brine, MD      . atorvastatin (LIPITOR) tablet 80 mg  80 mg Oral Daily Absher, Minette Brine, MD   80 mg at 07/12/20 1134  . Chlorhexidine Gluconate Cloth 2 % PADS 6 each  6 each Topical Daily Absher, Minette Brine, MD   6 each at 07/12/20 1045  . [START ON 07/13/2020] Chlorhexidine Gluconate Cloth 2 % PADS 6 each  6 each Topical Q0600 Reesa Chew, MD      . clevidipine (CLEVIPREX) infusion 0.5 mg/mL  0-21 mg/hr Intravenous Continuous de Rosario Jacks, MD   Stopped at 07/11/20 2020  . dextrose 50 % solution           . feeding supplement (NEPRO CARB STEADY) liquid 237 mL  237 mL Oral TID BM Garvin Fila, MD      . heparin injection 5,000 Units  5,000 Units Subcutaneous Q8H Absher, Minette Brine, MD   5,000 Units at 07/12/20 0518  . insulin aspart (novoLOG) injection 0-6 Units  0-6 Units Subcutaneous Q4H Bailey-Modzik, Delila A, NP      . MEDLINE mouth rinse  15 mL Mouth Rinse BID Garvin Fila, MD      . multivitamin (RENA-VIT) tablet 1 tablet  1 tablet Oral QHS Garvin Fila, MD      . senna-docusate (Senokot-S) tablet 1 tablet  1 tablet Oral QHS PRN Absher, Minette Brine, MD      . sodium chloride flush (NS) 0.9 %  injection 3 mL  3 mL Intravenous Once Davonna Belling, MD          Review of Systems: Unable to answer questions due to AMS  Physical Exam: Vitals:   07/12/20 1100 07/12/20 1200  BP: 127/64 127/87  Pulse: 83 70  Resp: 17 16  Temp:    SpO2: 99% 98%   Total I/O In: 788.3 [P.O.:480; I.V.:308.3] Out: -   Intake/Output Summary (Last 24 hours) at 07/12/2020 1337 Last data filed at 07/12/2020 1300 Gross per 24 hour  Intake 1700.66 ml  Output 10 ml  Net 1690.66 ml   Constitutional: Obese, lying in bed, no distress ENMT: ears and nose without scars or lesions, MMM CV: normal rate, trace edema in the lower extremity Respiratory: clear to auscultation, normal work of breathing Gastrointestinal:  soft, non-tender, no palpable masses or hernias Skin: no visible lesions or rashes Psych: Awake but unable to answer questions, confused   Test Results I personally reviewed new and old clinical labs and radiology tests Lab Results  Component Value Date   NA 134 (L) 07/12/2020   K 5.2 (H) 07/12/2020   CL 98 07/12/2020   CO2 23 07/12/2020   BUN 30 (H) 07/12/2020   CREATININE 8.13 (H) 07/12/2020   CALCIUM 8.4 (L) 07/12/2020   ALBUMIN 3.6 07/12/2020   PHOS 4.4 07/12/2020

## 2020-07-12 NOTE — Discharge Instructions (Addendum)
Femoral Site Care This sheet gives you information about how to care for yourself after your procedure. Your health care provider may also give you more specific instructions. If you have problems or questions, contact your health care provider. What can I expect after the procedure? After the procedure, it is common to have:  Bruising that usually fades within 1-2 weeks.  Tenderness at the site. Follow these instructions at home: Wound care 1. Follow instructions from your health care provider about how to take care of your insertion site. Make sure you: ? Wash your hands with soap and water before you change your bandage (dressing). If soap and water are not available, use hand sanitizer. ? Change your dressing as directed- pressure dressing removed 24 hours post-procedure (and switch for bandaid), bandaid removed 72 hours post-procedure 2. Do not take baths, swim, or use a hot tub for 7 days post-procedure. 3. You may shower 48 hours after the procedure or as told by your health care provider. ? Gently wash the site with plain soap and water. ? Pat the area dry with a clean towel. ? Do not rub the site. This may cause bleeding. 4. Check your site every day for signs of infection. Check for: ? Redness, swelling, or pain. ? Fluid or blood. ? Warmth. ? Pus or a bad smell. Activity  Do not stoop, bend, or lift anything that is heavier than 10 lb (4.5 kg) for 2 weeks post-procedure.  Do not drive self for 2 weeks post-procedure. Contact a health care provider if you have:  A fever or chills.  You have redness, swelling, or pain around your insertion site. Get help right away if:  The catheter insertion area swells very fast.  You pass out.  You suddenly start to sweat or your skin gets clammy.  The catheter insertion area is bleeding, and the bleeding does not stop when you hold steady pressure on the area.  The area near or just beyond the catheter insertion site becomes  pale, cool, tingly, or numb. These symptoms may represent a serious problem that is an emergency. Do not wait to see if the symptoms will go away. Get medical help right away. Call your local emergency services (911 in the [place] Do not drive yourself to the hospital.  This information is not intended to replace advice given to you by your health care provider. Make sure you discuss any questions you have with your health care provider. Document Revised: 05/24/2017 Document Reviewed: 05/24/2017 Elsevier Patient Education  2020 [employer].   ==============================================================================================  Information on my medicine - ELIQUIS (apixaban)  This medication education was reviewed with me or my healthcare representative as part of my discharge preparation.    Why was Eliquis prescribed for you? Eliquis was prescribed for you to reduce the risk of a blood clot forming that can cause a stroke if you have a medical condition called atrial fibrillation (a type of irregular heartbeat).  What do You need to know about Eliquis ? Take your Eliquis TWICE DAILY - one tablet in the morning and one tablet in the evening with or without food. If you have difficulty swallowing the tablet whole please discuss with your pharmacist how to take the medication safely.  Take Eliquis exactly as prescribed by your doctor and DO NOT stop taking Eliquis without talking to the doctor who prescribed the medication.  Stopping may increase your risk of developing a stroke.  Refill your prescription before you run out.  After discharge, you should have regular check-up appointments with your healthcare provider that is prescribing your Eliquis.  In the future your dose may need to be changed if your kidney function or weight changes by a significant amount or as you get older.  What do you do if you miss a dose? If you miss a dose, take it as soon as you remember on  the same day and resume taking twice daily.  Do not take more than one dose of ELIQUIS at the same time to make up a missed dose.  Important Safety Information A possible side effect of Eliquis is bleeding. You should call your healthcare provider right away if you experience any of the following: ? Bleeding from an injury or your nose that does not stop. ? Unusual colored urine (red or dark brown) or unusual colored stools (red or black). ? Unusual bruising for unknown reasons. ? A serious fall or if you hit your head (even if there is no bleeding).  Some medicines may interact with Eliquis and might increase your risk of bleeding or clotting while on Eliquis. To help avoid this, consult your healthcare provider or pharmacist prior to using any new prescription or non-prescription medications, including herbals, vitamins, non-steroidal anti-inflammatory drugs (NSAIDs) and supplements.  This website has more information on Eliquis (apixaban): http://[email or web address].com/eliquis/home

## 2020-07-12 NOTE — Progress Notes (Addendum)
STROKE TEAM PROGRESS NOTE   INTERVAL HISTORY Hypoglycemic overnight with good response to D50. Otherwise stable neurologically through the night.   S/p thrombectomy yesterday around 1500.  For left M2/MCA posterior division branch occlusion with complete tiki 3 revascularization following combination of aspiration and stent retriever.  She is extubated and breathing well. Patient is aphasic and not following commands well. Unable to obtain ROS. Plan of care explained. Unable to assess patient's comprehension Abnormal and irregular heart rhythm.  EP consulted feels it is atrial flutter.  No family at bedside.   Vitals:   07/12/20 0900 07/12/20 1000 07/12/20 1100 07/12/20 1200  BP: 131/73 131/77 127/64 127/87  Pulse: 74 80 83 70  Resp: $Remo'13 14 17 16  'zfnQT$ Temp:      TempSrc:      SpO2: 100% 95% 99% 98%  Weight:       CBC:  Recent Labs  Lab 07/11/20 1350 07/11/20 1446  WBC 6.4  --   NEUTROABS 3.3  --   HGB 12.6 13.9  HCT 40.9 41.0  MCV 99.3  --   PLT 179  --    Basic Metabolic Panel:  Recent Labs  Lab 07/11/20 2019 07/12/20 1141  NA 130* 134*  K 6.0* 5.2*  CL 97* 98  CO2 18* 23  GLUCOSE 488* 231*  BUN 27* 30*  CREATININE 6.32* 8.13*  CALCIUM 8.0* 8.4*  MG  --  1.9  PHOS 3.7 4.4   Lipid Panel:  Recent Labs  Lab 07/12/20 0229  CHOL 156  TRIG 382*  HDL 31*  CHOLHDL 5.0  VLDL 76*  LDLCALC 49   HgbA1c:  Recent Labs  Lab 07/12/20 0229  HGBA1C 8.5*   Urine Drug Screen: No results for input(s): LABOPIA, COCAINSCRNUR, LABBENZ, AMPHETMU, THCU, LABBARB in the last 168 hours.  Alcohol Level No results for input(s): ETH in the last 168 hours.  IMAGING past 24 hours CT Code Stroke CTA Head W/WO contrast  Result Date: 07/11/2020 CLINICAL DATA:  Right-sided deficits, slurred speech EXAM: CT HEAD WITHOUT CONTRAST CT ANGIOGRAPHY HEAD AND NECK TECHNIQUE: Multidetector CT imaging of the head was performed without contrast. Multidetector CT imaging of the head and neck was  performed using the standard protocol during bolus administration of intravenous contrast. Multiplanar CT image reconstructions and MIPs were obtained to evaluate the vascular anatomy. Carotid stenosis measurements (when applicable) are obtained utilizing NASCET criteria, using the distal internal carotid diameter as the denominator. CONTRAST:  100 mL Omnipaque 350 COMPARISON:  None. FINDINGS: CT HEAD FINDINGS Brain: There is no acute intracranial hemorrhage. Hypoattenuation with loss of gray-white differentiation in the parasagittal left occipital lobe. Possible loss of gray-white differentiation along the body of the left caudate. Ventricles and sulci are within normal limits in size and configuration. Vascular: No hyperdense vessel. Skull: Unremarkable. Sinuses/Orbits: Paranasal sinus mucosal thickening. No significant orbital abnormality. Other: Mastoid air cells are clear. ASPECTS ([place] Stroke Program Early CT Score) - Ganglionic level infarction (caudate, lentiform nuclei, internal capsule, insula, M1-M3 cortex): 6 - Supraganglionic infarction (M4-M6 cortex): 3 Total score (0-10 with 10 being normal): 9 Review of the MIP images confirms the above findings CTA NECK FINDINGS Aortic arch: Mild calcified plaque along the aortic arch. Patent great vessel origins. Right carotid system: Patent. Calcified plaque at the ICA origin without measurable stenosis. Left carotid system: Patent. Calcified plaque at the ICA origin without measurable stenosis. Vertebral arteries: Patent and codominant. Skeleton: Degenerative changes of the cervical spine. Other neck: No mass or adenopathy.  Upper chest: No apical lung mass. Review of the MIP images confirms the above findings CTA HEAD FINDINGS Anterior circulation: Intracranial internal carotid arteries are patent with calcified plaque causing mild stenosis. Proximal left M1 MCA is patent. There is nonocclusive thrombus at the bifurcation. There is occlusion of a proximal M3  branch. Right middle and both anterior cerebral arteries are patent. Posterior circulation: Intracranial vertebral arteries, basilar artery, and posterior cerebral arteries are patent. A left posterior communicating artery is present. Venous sinuses: As permitted by contrast timing, patent. Review of the MIP images confirms the above findings IMPRESSION: Acute left occipital lobe infarction. Suspected acute infarction of the body of the left caudate. No large vessel occlusion or hemodynamically significant stenosis in the neck. Nonocclusive thrombus at the left MCA bifurcation. Occlusion of a proximal left M3 MCA branch. Patent proximal left PCA. These results were communicated to Dr. Napoleon Form at 2:25 pm on 07/11/2020 by text page via the [facility] messaging system. Electronically Signed   By: Guadlupe Spanish M.D.   On: 07/11/2020 14:51   CT Code Stroke CTA Neck W/WO contrast  Result Date: 07/11/2020 CLINICAL DATA:  Right-sided deficits, slurred speech EXAM: CT HEAD WITHOUT CONTRAST CT ANGIOGRAPHY HEAD AND NECK TECHNIQUE: Multidetector CT imaging of the head was performed without contrast. Multidetector CT imaging of the head and neck was performed using the standard protocol during bolus administration of intravenous contrast. Multiplanar CT image reconstructions and MIPs were obtained to evaluate the vascular anatomy. Carotid stenosis measurements (when applicable) are obtained utilizing NASCET criteria, using the distal internal carotid diameter as the denominator. CONTRAST:  100 mL Omnipaque 350 COMPARISON:  None. FINDINGS: CT HEAD FINDINGS Brain: There is no acute intracranial hemorrhage. Hypoattenuation with loss of gray-white differentiation in the parasagittal left occipital lobe. Possible loss of gray-white differentiation along the body of the left caudate. Ventricles and sulci are within normal limits in size and configuration. Vascular: No hyperdense vessel. Skull: Unremarkable. Sinuses/Orbits: Paranasal  sinus mucosal thickening. No significant orbital abnormality. Other: Mastoid air cells are clear. ASPECTS ([place] Stroke Program Early CT Score) - Ganglionic level infarction (caudate, lentiform nuclei, internal capsule, insula, M1-M3 cortex): 6 - Supraganglionic infarction (M4-M6 cortex): 3 Total score (0-10 with 10 being normal): 9 Review of the MIP images confirms the above findings CTA NECK FINDINGS Aortic arch: Mild calcified plaque along the aortic arch. Patent great vessel origins. Right carotid system: Patent. Calcified plaque at the ICA origin without measurable stenosis. Left carotid system: Patent. Calcified plaque at the ICA origin without measurable stenosis. Vertebral arteries: Patent and codominant. Skeleton: Degenerative changes of the cervical spine. Other neck: No mass or adenopathy. Upper chest: No apical lung mass. Review of the MIP images confirms the above findings CTA HEAD FINDINGS Anterior circulation: Intracranial internal carotid arteries are patent with calcified plaque causing mild stenosis. Proximal left M1 MCA is patent. There is nonocclusive thrombus at the bifurcation. There is occlusion of a proximal M3 branch. Right middle and both anterior cerebral arteries are patent. Posterior circulation: Intracranial vertebral arteries, basilar artery, and posterior cerebral arteries are patent. A left posterior communicating artery is present. Venous sinuses: As permitted by contrast timing, patent. Review of the MIP images confirms the above findings IMPRESSION: Acute left occipital lobe infarction. Suspected acute infarction of the body of the left caudate. No large vessel occlusion or hemodynamically significant stenosis in the neck. Nonocclusive thrombus at the left MCA bifurcation. Occlusion of a proximal left M3 MCA branch. Patent proximal left PCA. These results  were communicated to Dr. Charlsie Merles at 2:25 pm on 07/11/2020 by text page via the [facility] messaging system. Electronically Signed    By: Macy Mis M.D.   On: 07/11/2020 14:51   MR BRAIN WO CONTRAST  Result Date: 07/12/2020 CLINICAL DATA:  Acute infarct EXAM: MRI HEAD WITHOUT CONTRAST TECHNIQUE: Multiplanar, multiecho pulse sequences of the brain and surrounding structures were obtained without intravenous contrast. COMPARISON:  Head CT 07/11/2020 FINDINGS: Brain: There is multifocal acute ischemia the left MCA and PCA territories, including a large MCA territory infarct that involves the corpus striatum, insula anterior temporal cortex and left frontal operculum. Petechial blood products at the left basal ganglia. There is moderate cytotoxic edema. The midline structures are normal. Vascular: Major flow voids are preserved. Skull and upper cervical spine: Normal calvarium and skull base. Visualized upper cervical spine and soft tissues are normal. Sinuses/Orbits:No paranasal sinus fluid levels or advanced mucosal thickening. No mastoid or middle ear effusion. Normal orbits. IMPRESSION: Acute ischemic infarcts within the left MCA and PCA territories with a small amount of petechial blood products in the left basal ganglia. No midline shift or other mass effect. Electronically Signed   By: Ulyses Jarred M.D.   On: 07/12/2020 02:11   DG Pelvis Portable  Result Date: 07/12/2020 CLINICAL DATA:  MRI clearance EXAM: PORTABLE PELVIS 1-2 VIEWS COMPARISON:  None. FINDINGS: There is no evidence of pelvic fracture or diastasis. No pelvic bone lesions are seen. IMPRESSION: Negative.  No metallic foreign body. Electronically Signed   By: Ulyses Jarred M.D.   On: 07/12/2020 01:33   IR US Guide Vasc Access Left  Result Date: 07/12/2020 INDICATION: 60 year old female with past medical history significant for Type 2 diabetes mellitus, chronic kidney disease stage 4, hypertension, obesity and OSA. Presenting with right-sided weakness and aphasia. Last known well at 8 a.m. on 07/11/2020. NIHSS 24; modified Rankin scale 1. Head CT showed  acute/subacute infarct in the left occipital lobe, nonocclusive distal left M1 thrombosis and occlusion of a distal left M2/MCA posterior division branch. She was taken to our service for a diagnostic cerebral angiogram and mechanical thrombectomy. EXAM: Ultrasound-guided vascular access Diagnostic cerebral angiogram Mechanical thrombectomy Flat panel head CT COMPARISON:  CT/CT angiogram of the head and neck July 11, 2020. MEDICATIONS: Refer to anesthesia documentation. ANESTHESIA/SEDATION: The procedure was performed in the general anesthesia. CONTRAST:  45 mL of Omnipaque 240 milligram/mL FLUOROSCOPY TIME:  Fluoroscopy Time: 9 minutes 30 seconds (678 mGy). COMPLICATIONS: None immediate. TECHNIQUE: Informed written consent was obtained from the patient's daughter after a thorough discussion of the procedural risks, benefits and alternatives. All questions were addressed. Maximal Sterile Barrier Technique was utilized including caps, mask, sterile gowns, sterile gloves, sterile drape, hand hygiene and skin antiseptic. A timeout was performed prior to the initiation of the procedure. The right groin was prepped and draped in the usual sterile fashion. Using a micropuncture kit and the modified Seldinger technique, access was gained to the left common femoral artery and an 8 French sheath was placed. Real-time ultrasound guidance was utilized for vascular access including the acquisition of a permanent ultrasound image documenting patency of the accessed vessel. Under fluoroscopy, a Zoom 88 guide catheter was navigated over a 6 [place] Berenstein 2 catheter and a 0.035" Terumo Glidewire into the aortic arch. The catheter was placed into the left common carotid artery and then advanced into the left internal carotid artery. The inner catheter was removed. Frontal and lateral angiograms of the head were obtained. FINDINGS: 1.  Heavily calcified plaques in the right common femoral artery with high-grade stenosis. 2.  Heavily calcified plaques in the left common femoral artery without significant stenosis. 3. Occlusion of a distal left M2/MCA posterior division branch. PROCEDURE: Under biplane roadmap, a zoom 71 aspiration catheter was navigated over a phenom 21 microcatheter and a synchro support microguidewire into the cavernous segment of the left ICA. The microcatheter was then navigated over the wire into the left M2/MCA posterior division branch. Then, a 4 x 40 mm solitaire stent retriever was deployed spanning the M2 segment. The device was allowed to intercalated with the clot for 4 minutes. The microcatheter was removed. The aspiration catheter was advanced to the level of occlusion and connected to a penumbra asp a iration pump. The guiding catheter was advanced to the level of the supraclinoid left ICA. The thrombectomy device and aspiration catheter were removed under constant aspiration. Follow-up left ICA angiograms with frontal lateral views of the head showed complete recanalization of the left MCA territory (TICI3). Flat panel CT of the head was obtained and post processed in a separate workstation with concurrent attending physician supervision. Selected images were sent to PACS. Minimal left to in hyperdensity may represent minimal contrast staining. The guide catheter was withdrawn. A 6 [place] Berenstein 2 catheter was navigated over a 0.035 inch Terumo Glidewire into the aortic arch. The catheter was then placed in the left subclavian artery and subsequently in the left vertebral artery. Townes and lateral angiograms of the head were obtained. The left vertebral artery, basilar and bilateral posterior cerebral arteries are patent. The intracranial right vertebral artery was seen by contrast reflux in appear normal. The catheter was subsequently withdrawn. Left common femoral artery angiograms were obtained and lateral left anterior oblique view. The puncture is at the level of the left femoral artery. The  femoral sheath was exchanged for an 8 [place] Angio-Seal which was utilized for access closure. Immediate hemostasis was achieved IMPRESSION: Successful and uncomplicated mechanical thrombectomy for treatment of a left M2/MCA occlusion. One pass performed with complete recanalization (TICI 3) PLAN: Patient extubated and transferred to ICU for continued care. Electronically Signed   By: Pedro Earls M.D.   On: 07/12/2020 12:15   DG CHEST PORT 1 VIEW  Result Date: 07/12/2020 CLINICAL DATA:  MRI screening EXAM: PORTABLE CHEST 1 VIEW COMPARISON:  None. FINDINGS: The right side of the image is labeled incorrectly. There is mild cardiomegaly with left basilar mild opacity. No metallic foreign body. IMPRESSION: 1. No metallic foreign body. Electronically Signed   By: Ulyses Jarred M.D.   On: 07/12/2020 01:35   DG Abd Portable 1V  Result Date: 07/12/2020 CLINICAL DATA:  MRI clearance EXAM: PORTABLE ABDOMEN - 1 VIEW COMPARISON:  None. FINDINGS: The bowel gas pattern is normal. No radio-opaque calculi or other significant radiographic abnormality are seen. IMPRESSION: No metallic foreign body. Electronically Signed   By: Ulyses Jarred M.D.   On: 07/12/2020 01:35   ECHOCARDIOGRAM COMPLETE  Result Date: 07/12/2020    ECHOCARDIOGRAM REPORT   Patient Name:   JACLYNNE BALDO Date of Exam: 07/12/2020 Medical Rec #:  [id number]     Height: Accession #:    [id number]    Weight:       255.1 lb Date of Birth:  Aug 06, 1960     BSA:          2.375 m Patient Age:    1 years      BP:  131/73 mmHg Patient Gender: F             HR:           71 bpm. Exam Location:  Inpatient Procedure: 2D Echo, Cardiac Doppler, Color Doppler and Intracardiac            Opacification Agent Indications:    Stroke  History:        Patient has no prior history of Echocardiogram examinations.                 Stroke; Risk Factors:Hypertension, Dyslipidemia and Sleep Apnea.  Sonographer:    Roseanna Rainbow RDCS Referring Phys: [id number] Minette Brine [facility]  Sonographer Comments: Technically difficult study due to poor echo windows and patient is morbidly obese. Image acquisition challenging due to patient body habitus. IMPRESSIONS  1. Left ventricular ejection fraction, by estimation, is 55 to 60%. The left ventricle has normal function. The left ventricle has no regional wall motion abnormalities. There is moderate left ventricular hypertrophy. Left ventricular diastolic parameters are indeterminate.  2. Right ventricular systolic function is normal. The right ventricular size is normal.  3. The mitral valve is normal in structure. No evidence of mitral valve regurgitation. No evidence of mitral stenosis.  4. The aortic valve is tricuspid. Aortic valve regurgitation is trivial. Mild aortic valve sclerosis is present, with no evidence of aortic valve stenosis.  5. The inferior vena cava is dilated in size with >50% respiratory variability, suggesting right atrial pressure of 8 mmHg. FINDINGS  Left Ventricle: Left ventricular ejection fraction, by estimation, is 55 to 60%. The left ventricle has normal function. The left ventricle has no regional wall motion abnormalities. Definity contrast agent was given IV to delineate the left ventricular  endocardial borders. The left ventricular internal cavity size was normal in size. There is moderate left ventricular hypertrophy. Left ventricular diastolic parameters are indeterminate. Right Ventricle: The right ventricular size is normal. No increase in right ventricular wall thickness. Right ventricular systolic function is normal. Left Atrium: Left atrial size was normal in size. Right Atrium: Right atrial size was normal in size. Pericardium: There is no evidence of pericardial effusion. Mitral Valve: The mitral valve is normal in structure. No evidence of mitral valve regurgitation. No evidence of mitral valve stenosis. Tricuspid Valve: The tricuspid valve is normal in structure. Tricuspid valve regurgitation is  not demonstrated. No evidence of tricuspid stenosis. Aortic Valve: The aortic valve is tricuspid. Aortic valve regurgitation is trivial. Mild aortic valve sclerosis is present, with no evidence of aortic valve stenosis. Pulmonic Valve: The pulmonic valve was normal in structure. Pulmonic valve regurgitation is trivial. No evidence of pulmonic stenosis. Aorta: The aortic root is normal in size and structure. Venous: The inferior vena cava is dilated in size with greater than 50% respiratory variability, suggesting right atrial pressure of 8 mmHg. IAS/Shunts: No atrial level shunt detected by color flow Doppler.  LEFT VENTRICLE PLAX 2D LVIDd:         4.40 cm LVIDs:         3.30 cm LV PW:         1.40 cm LV IVS:        1.40 cm LVOT diam:     2.30 cm LV SV:         83 LV SV Index:   35 LVOT Area:     4.15 cm  LV Volumes (MOD) LV vol d, MOD A2C: 57.1 ml LV vol d, MOD A4C: 84.3  ml LV vol s, MOD A2C: 20.0 ml LV vol s, MOD A4C: 39.0 ml LV SV MOD A2C:     37.1 ml LV SV MOD A4C:     84.3 ml LV SV MOD BP:      41.7 ml RIGHT VENTRICLE             IVC RV S prime:     15.20 cm/s  IVC diam: 2.10 cm TAPSE (M-mode): 2.6 cm LEFT ATRIUM             Index       RIGHT ATRIUM           Index LA diam:        4.00 cm 1.68 cm/m  RA Area:     14.50 cm LA Vol (A2C):   32.1 ml 13.52 ml/m RA Volume:   34.00 ml  14.32 ml/m LA Vol (A4C):   44.1 ml 18.57 ml/m LA Biplane Vol: 40.5 ml 17.05 ml/m  AORTIC VALVE LVOT Vmax:   104.00 cm/s LVOT Vmean:  79.200 cm/s LVOT VTI:    0.200 m  AORTA Ao Root diam: 3.30 cm Ao Asc diam:  3.60 cm MITRAL VALVE MV Area (PHT): 4.34 cm     SHUNTS MV Decel Time: 175 msec     Systemic VTI:  0.20 m MV E velocity: 121.00 cm/s  Systemic Diam: 2.30 cm Candee Furbish MD Electronically signed by Candee Furbish MD Signature Date/Time: 07/12/2020/11:51:00 AM    Final    IR PERCUTANEOUS ART THROMBECTOMY/INFUSION INTRACRANIAL INC DIAG ANGIO  Result Date: 07/12/2020 INDICATION: 60 year old female with past medical history  significant for Type 2 diabetes mellitus, chronic kidney disease stage 4, hypertension, obesity and OSA. Presenting with right-sided weakness and aphasia. Last known well at 8 a.m. on 07/11/2020. NIHSS 24; modified Rankin scale 1. Head CT showed acute/subacute infarct in the left occipital lobe, nonocclusive distal left M1 thrombosis and occlusion of a distal left M2/MCA posterior division branch. She was taken to our service for a diagnostic cerebral angiogram and mechanical thrombectomy. EXAM: Ultrasound-guided vascular access Diagnostic cerebral angiogram Mechanical thrombectomy Flat panel head CT COMPARISON:  CT/CT angiogram of the head and neck July 11, 2020. MEDICATIONS: Refer to anesthesia documentation. ANESTHESIA/SEDATION: The procedure was performed in the general anesthesia. CONTRAST:  45 mL of Omnipaque 240 milligram/mL FLUOROSCOPY TIME:  Fluoroscopy Time: 9 minutes 30 seconds (678 mGy). COMPLICATIONS: None immediate. TECHNIQUE: Informed written consent was obtained from the patient's daughter after a thorough discussion of the procedural risks, benefits and alternatives. All questions were addressed. Maximal Sterile Barrier Technique was utilized including caps, mask, sterile gowns, sterile gloves, sterile drape, hand hygiene and skin antiseptic. A timeout was performed prior to the initiation of the procedure. The right groin was prepped and draped in the usual sterile fashion. Using a micropuncture kit and the modified Seldinger technique, access was gained to the left common femoral artery and an 8 French sheath was placed. Real-time ultrasound guidance was utilized for vascular access including the acquisition of a permanent ultrasound image documenting patency of the accessed vessel. Under fluoroscopy, a Zoom 88 guide catheter was navigated over a 6 [place] Berenstein 2 catheter and a 0.035" Terumo Glidewire into the aortic arch. The catheter was placed into the left common carotid artery and  then advanced into the left internal carotid artery. The inner catheter was removed. Frontal and lateral angiograms of the head were obtained. FINDINGS: 1. Heavily calcified plaques in the right common femoral artery with  high-grade stenosis. 2. Heavily calcified plaques in the left common femoral artery without significant stenosis. 3. Occlusion of a distal left M2/MCA posterior division branch. PROCEDURE: Under biplane roadmap, a zoom 71 aspiration catheter was navigated over a phenom 21 microcatheter and a synchro support microguidewire into the cavernous segment of the left ICA. The microcatheter was then navigated over the wire into the left M2/MCA posterior division branch. Then, a 4 x 40 mm solitaire stent retriever was deployed spanning the M2 segment. The device was allowed to intercalated with the clot for 4 minutes. The microcatheter was removed. The aspiration catheter was advanced to the level of occlusion and connected to a penumbra asp a iration pump. The guiding catheter was advanced to the level of the supraclinoid left ICA. The thrombectomy device and aspiration catheter were removed under constant aspiration. Follow-up left ICA angiograms with frontal lateral views of the head showed complete recanalization of the left MCA territory (TICI3). Flat panel CT of the head was obtained and post processed in a separate workstation with concurrent attending physician supervision. Selected images were sent to PACS. Minimal left to in hyperdensity may represent minimal contrast staining. The guide catheter was withdrawn. A 6 [place] Berenstein 2 catheter was navigated over a 0.035 inch Terumo Glidewire into the aortic arch. The catheter was then placed in the left subclavian artery and subsequently in the left vertebral artery. Townes and lateral angiograms of the head were obtained. The left vertebral artery, basilar and bilateral posterior cerebral arteries are patent. The intracranial right vertebral  artery was seen by contrast reflux in appear normal. The catheter was subsequently withdrawn. Left common femoral artery angiograms were obtained and lateral left anterior oblique view. The puncture is at the level of the left femoral artery. The femoral sheath was exchanged for an 8 [place] Angio-Seal which was utilized for access closure. Immediate hemostasis was achieved IMPRESSION: Successful and uncomplicated mechanical thrombectomy for treatment of a left M2/MCA occlusion. One pass performed with complete recanalization (TICI 3) PLAN: Patient extubated and transferred to ICU for continued care. Electronically Signed   By: Pedro Earls M.D.   On: 07/12/2020 12:15   CT HEAD CODE STROKE WO CONTRAST  Result Date: 07/11/2020 CLINICAL DATA:  Right-sided deficits, slurred speech EXAM: CT HEAD WITHOUT CONTRAST CT ANGIOGRAPHY HEAD AND NECK TECHNIQUE: Multidetector CT imaging of the head was performed without contrast. Multidetector CT imaging of the head and neck was performed using the standard protocol during bolus administration of intravenous contrast. Multiplanar CT image reconstructions and MIPs were obtained to evaluate the vascular anatomy. Carotid stenosis measurements (when applicable) are obtained utilizing NASCET criteria, using the distal internal carotid diameter as the denominator. CONTRAST:  100 mL Omnipaque 350 COMPARISON:  None. FINDINGS: CT HEAD FINDINGS Brain: There is no acute intracranial hemorrhage. Hypoattenuation with loss of gray-white differentiation in the parasagittal left occipital lobe. Possible loss of gray-white differentiation along the body of the left caudate. Ventricles and sulci are within normal limits in size and configuration. Vascular: No hyperdense vessel. Skull: Unremarkable. Sinuses/Orbits: Paranasal sinus mucosal thickening. No significant orbital abnormality. Other: Mastoid air cells are clear. ASPECTS (Bradley Stroke Program Early CT Score) -  Ganglionic level infarction (caudate, lentiform nuclei, internal capsule, insula, M1-M3 cortex): 6 - Supraganglionic infarction (M4-M6 cortex): 3 Total score (0-10 with 10 being normal): 9 Review of the MIP images confirms the above findings CTA NECK FINDINGS Aortic arch: Mild calcified plaque along the aortic arch. Patent great vessel origins. Right carotid  system: Patent. Calcified plaque at the ICA origin without measurable stenosis. Left carotid system: Patent. Calcified plaque at the ICA origin without measurable stenosis. Vertebral arteries: Patent and codominant. Skeleton: Degenerative changes of the cervical spine. Other neck: No mass or adenopathy. Upper chest: No apical lung mass. Review of the MIP images confirms the above findings CTA HEAD FINDINGS Anterior circulation: Intracranial internal carotid arteries are patent with calcified plaque causing mild stenosis. Proximal left M1 MCA is patent. There is nonocclusive thrombus at the bifurcation. There is occlusion of a proximal M3 branch. Right middle and both anterior cerebral arteries are patent. Posterior circulation: Intracranial vertebral arteries, basilar artery, and posterior cerebral arteries are patent. A left posterior communicating artery is present. Venous sinuses: As permitted by contrast timing, patent. Review of the MIP images confirms the above findings IMPRESSION: Acute left occipital lobe infarction. Suspected acute infarction of the body of the left caudate. No large vessel occlusion or hemodynamically significant stenosis in the neck. Nonocclusive thrombus at the left MCA bifurcation. Occlusion of a proximal left M3 MCA branch. Patent proximal left PCA. These results were communicated to Dr. Charlsie Merles at 2:25 pm on 07/11/2020 by text page via the [facility] messaging system. Electronically Signed   By: Macy Mis M.D.   On: 07/11/2020 14:51   PHYSICAL EXAM Constitutional: Obese middle-aged lady not in distress., eyes open, unable to  vocalize  Psych: Affect appropriate to situation Eyes: No scleral injection HENT: No OP obstrucion MSK: no joint deformities.  Cardiovascular: Normal rate and regular rhythm.  Respiratory: Effort normal, non-labored breathing, apneic at times. GI: Soft.  No distension. There is no tenderness.  Skin: WDI  Neuro: Mental Status: Patient is awake, alert, severely aphasic, mimics but does not follow verbal commands.  Mute barely speaks occasional words. Cranial Nerves: II: Visual Fields notable for decreased response to visual threat from the right. Pupils are equal, round, and reactive to light. III,IV, VI: Left gaze preference V: unable to test VII: Facial weakness on the right  VIII: deferred X: gag reflex intact XI: deferred XII: unable to cooperate Motor: Antigravity Bilat UE, +right UE drift,  No movement in right leg to deep nailbed pressure. Moves left arm/leg well and has 5/5 power in the left arm with no drift. Sensory: Sensation is diminished to noxious stimulation throughout right side, lesser degree on the left Deep Tendon Reflexes: areflexic Plantars: deferred Cerebellar: Unable to test  ASSESSMENT/PLAN KLAIR LEISING is a 60 y.o. female with PMH significant for ESRD, HTN, OSA on CPAP, HLD, DM2 with peripheral neuropathy, remote seizure d/t hypoglycemia, morbid obesity, BLE claudication, tachybrady syndrome with pacemaker, NSVT, anemia, Vitamin B12 deficiency, MRSA PNA, chronic bronchitis, hypothyroidism, and  bilat ulnar neuropathy who was LKW at dialysis 2/17. She drove herself to dialysis and then back home again. Soon thereafter, she was noted to have right sided weakness with gaze preference and inability to communicate. EMS was ca;;ed and she was brought in as a code stroke. BP in the 170s, glucose wnl.   Per Dr. Derenda Fennel Consultation: "Of note, she was seen on 07/09/20 in the ED at [facility] for another episode that occurred during dialysis. She became  bradycardic and went "blind in the left eye." A CT head was suggested but she left without this test. She had bradycardia and dizziness at the time, perhaps precipitating the spell. Plans were initiated to have her pacemaker replaced. The note from that visit states, "Dr. Myles Rosenthal was consulted and stated that patient  was very symptomatic when she got bradycardic this morning a.m. wanted [place] to make sure to possibly get a CT of her head and admit her to the hospital and let Dr. Minna Merritts See her and decide if he wants to put in a pacemaker now. Dr. Minna Merritts agreed to this plan but the patient stated that she is not going to stay in the hospital, and she will call Dr. Talbot Grumbling office and make arrangements for him to redo her pacemaker. Patient states that she is tired, her symptoms have been completely back to normal, she did not make arrangements to stay here today, and she will just follow-up as an outpatient. 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 Suspected acute infarction of the body of the left caudate. No large vessel occlusion or hemodynamically significant stenosis in the neck. Nonocclusive thrombus at the left MCA bifurcation. Occlusion of a proximal left M3 MCA branch. Patent proximal left PCA.  CTA head & neck:  Acute left occipital lobe infarction. Suspected acute infarction of the body of the left caudate. No large vessel occlusion or hemodynamically significant stenosis in the neck. Nonocclusive thrombus at the left MCA bifurcation. Occlusion of a proximal left M3 MCA branch. Patent proximal left PCA.   MRI   Acute ischemic infarcts within the left MCA and PCA territories with a  small amount of petechial blood products in the left basal ganglia. No midline shift or other mass effect.   2D Echo EF 55-60%, No shunt   LDL 49  HgbA1c 8.5  VTE prophylaxis -heparin 5000 sq q 8 hrs  Passed swallow eval for dysphagia diet, monitor po intake. RN will notify if po intake is poor.   Therapy recommendations:  TBD  Disposition:  TBD  Hypertension  Home meds:  Norvasc $Remove'10mg'nnQiRKh$  daily, losartan $RemoveBeforeD'50mg'AblhPONFeACYve$  daily demadex $RemoveBefor'20mg'tRmhlHCVneVh$  daily   106-154/49-82, Cleviprex gtt stopped last pm  . Permissive hypertension (OK if < 220/120) but gradually normalize in 5-7 days . Long-term BP goal normotensive  Hyperlipidemia  Home meds:  80 mg Lipitor daily   LDL 49 at goal < 70  Continue statin at discharge  Diabetes type II Uncontrolled  Home meds:  Lantus 80 units at bedtime   HgbA1c 8.5, goal < 7.0  CBGs show labile blood glucose with hypoglycemic episodes x 2 treated with D50 Recent Labs    07/12/20 0801 07/12/20 1116 07/12/20 1151  GLUCAP 99 36* 134*      SSI coverage changed to very sensitive scale   Has been cleared for diet, RN will report if patient is not taking po well. Discussed with Dr. Joelyn Oms. Defer to nephrology management.   Other Stroke Risk Factors  Former Cigarette smoker x 27 years, quit 2019  Obesity, There is no height or weight on file to calculate BMI., BMI >/= 30 associated with increased stroke risk, recommend weight loss, diet and exercise as appropriate   Family hx stroke (Sister)  Obstructive sleep apnea, on CPAP at home  Other Active Problems: ESRD  Nephrology consulted and following. Appreciate assistance.  Hyperkalemia: meg mgmt  Nephrology advises to  avoid HD in first 24h post CVA if possible  Daily renal panel   Daily  weights, Daily Renal Panel, Strict I/Os, Avoid nephrotoxins (NSAIDs, judicious IV Contrast)  Arrhythmia: Known hx of some degree of PVCs, NSVT, and bradycardia with pacemaker and recent history of aborted  replacement attempt. Also declined admission and  MICRA implant  2/15 at [facility].  EP consult called. Appreciate recommendations.   New atrial flutter identified.  No bradycardia episodes seen  Continue cardiac monitoring   Hospital day # 1  I have personally obtained history,examined this patient, reviewed notes, independently viewed imaging studies, participated in medical decision making and plan of care.ROS completed by me personally and pertinent positives fully documented  I have made any additions or clarifications directly to the above note. Agree with note above.  She presented with aphasia and right hemiparesis secondary to left M2 posterior division as well as a previous left PCA infarct.  She underwent successful mechanical thrombectomy but remains with significant aphasia, right hemiparesis as well as right-sided visual field loss.  EKG monitor now shows atrial flutter which is likely etiology of her stroke.  Continue close neurological monitoring and strict blood pressure control as per post intervention protocol.  The patient has fairly moderate size left MCA and PCA infarct with trace hemorrhagic transformation however she has had 2 separate strokes involving left PCA and left MCA within a few days remains at high risk for further strokes due to atrial fibrillation flutter on the monitor hence risk-benefit of starting anticoagulation with heparin may outweigh the risk of hemorrhagic transformation.  Start IV heparin drip stroke protocol and switch to oral anticoagulation when patient is able to swallow safely.  Continue ongoing stroke work-up.  Appreciate electrophysiology team consult.  Discussed with neuro interventional team physician assistant.  No family available at the bedside for discussion.  I called and left a message on the patient's daughters answering machine [phone] to call me back to discuss her condition. This patient is critically ill and at significant risk of  neurological worsening, death and care requires constant monitoring of vital signs, hemodynamics,respiratory and cardiac monitoring, extensive review of multiple databases, frequent neurological assessment, discussion with family, other specialists and medical decision making of high complexity.I have made any additions or clarifications directly to the above note.This critical care time does not reflect procedure time, or teaching time or supervisory time of PA/NP/Med Resident etc but could involve care discussion time.  I spent 35 minutes of neurocritical care time  in the care of  this patient.     Antony Contras, MD Medical Director [facility] Pager: [phone] 07/12/2020 2:35 PM   To contact Stroke Continuity provider, please refer to [email or web address] After hours, contact General Neurology

## 2020-07-12 NOTE — Progress Notes (Signed)
  Echocardiogram 2D Echocardiogram has been performed.  Rachael Herrera 07/12/2020, 9:37 AM

## 2020-07-12 NOTE — Progress Notes (Signed)
ANTICOAGULATION CONSULT NOTE  Pharmacy Consult for heparin Indication: atrial fibrillation  No Known Allergies  Patient Measurements: Height: '5\' 7"'$  (170.2 cm) Weight: 115.7 kg (255 lb 1.2 oz) IBW/kg (Calculated) : 61.6 Heparin Dosing Weight: 88kg  Vital Signs: Temp: 99.9 F (37.7 C) (02/18 2000) Temp Source: Oral (02/18 2000) BP: 155/62 (02/18 2300) Pulse Rate: 77 (02/18 2300)  Labs: Recent Labs    07/11/20 1350 07/11/20 1446 07/11/20 2019 07/12/20 1141 07/12/20 2247  HGB 12.6 13.9  --   --   --   HCT 40.9 41.0  --   --   --   PLT 179  --   --   --   --   APTT 28  --   --   --   --   LABPROT 12.7  --   --   --   --   INR 1.0  --   --   --   --   HEPARINUNFRC  --   --   --   --  0.52  CREATININE 6.16* 6.10* 6.32* 8.13*  --     Estimated Creatinine Clearance: 9.5 mL/min (A) (by C-G formula based on SCr of 8.13 mg/dL (H)).   Medical History: History reviewed. No pertinent past medical history.  Assessment: 35 YOF presenting as code stroke, MCA infarct, no tPA and TICI3 in IR.  Now with new onset aflutter found, CHA2DS2VASc 6.  Not on anticoagulation PTA.  Heparin level slightly supratherapeutic (0.52) on gtt at 1100 units/hr. No bleeding reported per RN.  Goal of Therapy:  Heparin level 0.3-0.5 units/ml Monitor platelets by anticoagulation protocol: Yes   Plan:  Decrease heparin gtt to 1050 units/hr F/u 8 hour heparin level  Sherlon Handing, PharmD, BCPS Please see amion for complete clinical pharmacist phone list 07/12/2020 11:34 PM

## 2020-07-12 NOTE — Evaluation (Signed)
Clinical/Bedside Swallow Evaluation Patient Details  Name: Rachael Herrera MRN: [id number] Date of Birth: Sep 17, 1960  Today's Date: 07/12/2020 Time: SLP Start Time (ACUTE ONLY): 1056 SLP Stop Time (ACUTE ONLY): 1119 SLP Time Calculation (min) (ACUTE ONLY): 23 min  Past Medical History: History reviewed. No pertinent past medical history. Past Surgical History:  Past Surgical History:  Procedure Laterality Date  . IR PERCUTANEOUS ART THROMBECTOMY/INFUSION INTRACRANIAL INC DIAG ANGIO  07/11/2020  . IR US GUIDE VASC ACCESS LEFT  07/12/2020  . RADIOLOGY WITH ANESTHESIA N/A 07/11/2020   Procedure: IR WITH ANESTHESIA;  Surgeon: Radiologist, Medication, MD;  Location: [facility];  Service: Radiology;  Laterality: N/A;   HPI:  60 yr old experienced difficulty speaking and moving the right side. MRI revelaed acute ischemic infarcts within the left MCA and PCA territories with a small amount of petechial blood products in the left basal  ganglia. Underwent emergent thrombectomy of left MCA M2 occlusion and revascularization. Intubated 2/17 during procedure.   Assessment / Plan / Recommendation Clinical Impression  Pt exhibited mild right CN VII dysfunction marked by labial spillage, minimal buccal cavity and labial residue. Self fed with non dominant left hand and therapist assist to steady container and execution oral cavity with tactile facilitation to slow rate (impulsive). Consecutive cups sips of 2 oz water was consumed and unremarkable for pharyngeal dysfucntion from clinical view. Likely airway intrusion of thin following solid trial marked by immediate cough. Several minimal and delayed throat clears during evaluation. Therapist recommending Dys 1 (puree), thin liquid, no straws, pills whole in puree, full assist needed for slow rate and feeding assist. SLP will follow closely and if s/s increase, will recommend objective testing. SLP Visit Diagnosis: Dysphagia, unspecified (R13.10)    Aspiration Risk   Mild aspiration risk;Moderate aspiration risk    Diet Recommendation Dysphagia 1 (Puree);Thin liquid   Liquid Administration via: Cup;No straw Medication Administration: Whole meds with puree Supervision: Staff to assist with self feeding;Full supervision/cueing for compensatory strategies Compensations: Minimize environmental distractions;Slow rate;Small sips/bites;Lingual sweep for clearance of pocketing Postural Changes: Seated upright at 90 degrees    Other  Recommendations Oral Care Recommendations: Oral care BID   Follow up Recommendations  (CIR vs SNF)      Frequency and Duration min 2x/week  2 weeks       Prognosis Prognosis for Safe Diet Advancement: Good Barriers to Reach Goals: Severity of deficits      Swallow Study   General Date of Onset: 07/11/20 HPI: 60 yr old experienced difficulty speaking and moving the right side. MRI revelaed acute ischemic infarcts within the left MCA and PCA territories with a small amount of petechial blood products in the left basal  ganglia. Underwent emergent thrombectomy of left MCA M2 occlusion and revascularization. Intubated 2/17 during procedure. Type of Study: Bedside Swallow Evaluation Previous Swallow Assessment: none Diet Prior to this Study: NPO Temperature Spikes Noted: No Respiratory Status: Nasal cannula History of Recent Intubation: Yes Length of Intubations (days):  (during procedure) Date extubated: 07/11/20 Behavior/Cognition: Alert;Cooperative;Pleasant mood;Requires cueing;Impulsive (with eating) Oral Cavity Assessment:  (lingual coating?) Oral Care Completed by SLP: Yes Oral Cavity - Dentition: Adequate natural dentition Vision: Functional for self-feeding Self-Feeding Abilities: Needs assist Patient Positioning: Upright in bed Baseline Vocal Quality: Normal Volitional Cough: Other (Comment) (unable -may be due to apraxia) Volitional Swallow: Able to elicit    Oral/Motor/Sensory Function Overall Oral  Motor/Sensory Function: Mild impairment Facial ROM: Reduced right;Suspected CN VII (facial) dysfunction Facial Symmetry: Abnormal symmetry right;Suspected  CN VII (facial) dysfunction Facial Strength: Reduced right;Suspected CN VII (facial) dysfunction Facial Sensation: Reduced right;Suspected CN V (Trigeminal) dysfunction Lingual ROM:  (unable to protrude)   Ice Chips Ice chips: Not tested   Thin Liquid Thin Liquid: Impaired Presentation: Cup;Straw Oral Phase Impairments: Reduced labial seal;Reduced lingual movement/coordination Oral Phase Functional Implications: Right anterior spillage Pharyngeal  Phase Impairments: Cough - Immediate;Other (comments) (after solid trial)    Nectar Thick Nectar Thick Liquid: Not tested   Honey Thick Honey Thick Liquid: Not tested   Puree Puree: Impaired Presentation: Spoon Oral Phase Impairments: Reduced lingual movement/coordination Oral Phase Functional Implications:  (lingual residue and possible right buccal cavity) Pharyngeal Phase Impairments:  (none)   Solid     Solid: Impaired Oral Phase Impairments: Reduced lingual movement/coordination Oral Phase Functional Implications: Prolonged oral transit      [name] 07/12/2020,1:05 PM [name].Ed Risk analyst [phone] Office [phone]

## 2020-07-12 NOTE — Evaluation (Addendum)
Speech Language Pathology Evaluation Patient Details Name: Rachael Herrera MRN: [id number] DOB: [birth year] Today's Date: 07/12/2020 Time: [id number] SLP Time Calculation (min) (ACUTE ONLY): 23 min  Problem List:  Patient Active Problem List   Diagnosis Date Noted  . Cerebral embolism with cerebral infarction 07/11/2020  . Stroke ([place]) 07/11/2020  . Comprehensive diabetic foot examination, type 2 DM, encounter for ([place]) 09/25/2019  . OSA (obstructive sleep apnea) 11/21/2018  . Acquired hypothyroidism 05/04/2014  . Chronic kidney disease, stage IV (severe) ([place]) 05/04/2014  . Essential hypertension 05/04/2014  . Hyperlipidemia due to type 1 diabetes mellitus ([place]) 05/04/2014  . Morbid obesity ([place]) 05/04/2014   Past Medical History: History reviewed. No pertinent past medical history. Past Surgical History:  Past Surgical History:  Procedure Laterality Date  . IR PERCUTANEOUS ART THROMBECTOMY/INFUSION INTRACRANIAL INC DIAG ANGIO  07/11/2020  . IR US GUIDE VASC ACCESS LEFT  07/12/2020  . RADIOLOGY WITH ANESTHESIA N/A 07/11/2020   Procedure: IR WITH ANESTHESIA;  Surgeon: Radiologist, Medication, MD;  Location: [facility];  Service: Radiology;  Laterality: N/A;   HPI:  60 yr old experienced difficulty speaking and moving the right side. MRI revelaed acute ischemic infarcts within the left MCA and PCA territories with a small amount of petechial blood products in the left basal  ganglia. Underwent emergent thrombectomy of left MCA M2 occlusion and revascularization. Intubated 2/17 during procedure.   Assessment / Plan / Recommendation Clinical Impression  Pt presents with a non fluent aphasia affecting expressive abilities slightly greather than recrptive. She followed one step commands to 60% with motor apraxia present. Comprehension for biographical and environmental y/n questions 50-60% accurate. Her pragmatics are appropriate. Verbal communication was limited to single words of social, filler  context "um, yes, uh huh". She could not imitate sounds or words suspected due to motor planning dysfunction. During familiar songs she approximated 2 words x 2 and tune was fair-good. Comprehended directions to count and stated "one" independently and was not accurate in unison with therapist or with written cues. Pt was not frustrated and did not appear aware of verbal errors. She wrote first and last name accurately and independently and unable to generate and write address. Suspect cognition is not at baseline and mildly impaired.  Recommend continued ST.    SLP Assessment  SLP Recommendation/Assessment: Patient needs continued Speech Lanaguage Pathology Services SLP Visit Diagnosis: Aphasia (R47.01);Cognitive communication deficit (R41.841)    Follow Up Recommendations   (CIR vs SNF)    Frequency and Duration min 2x/week  2 weeks      SLP Evaluation Cognition  Overall Cognitive Status: Impaired/Different from baseline Arousal/Alertness: Awake/alert Orientation Level: Oriented to person;Disoriented to place;Disoriented to time;Disoriented to situation Attention: Sustained Sustained Attention: Impaired Sustained Attention Impairment: Verbal basic;Functional basic Memory:  (TBA) Awareness: Impaired Awareness Impairment: Emergent impairment Problem Solving: Impaired Problem Solving Impairment: Functional basic Behaviors: Impulsive Safety/Judgment: Impaired       Comprehension  Auditory Comprehension Overall Auditory Comprehension: Impaired Yes/No Questions: Impaired Basic Biographical Questions: 51-75% accurate (60%) Basic Immediate Environment Questions: 50-74% accurate (50%) Commands: Impaired One Step Basic Commands: 50-74% accurate (60% apraxia) Visual Recognition/Discrimination Discrimination: Not tested Reading Comprehension Reading Status:  (TBA)    Expression Expression Primary Mode of Expression: Verbal Verbal Expression Overall Verbal Expression:  Impaired Initiation: Impaired Level of Generative/Spontaneous Verbalization: Word Repetition: Impaired Level of Impairment: Word level Naming: Impairment Responsive: 0-25% accurate (0%) Confrontation: Impaired (0%) Verbal Errors:  (mostly filler words "um, yes") Pragmatics: No impairment Written Expression Dominant Hand:  Right Written Expression: Exceptions to [facility] Self Formulation Ability: Word (wrote first last name independently - unable for address)   Oral / Motor  Oral Motor/Sensory Function Overall Oral Motor/Sensory Function: Mild impairment Facial ROM: Reduced right;Suspected CN VII (facial) dysfunction Facial Symmetry: Abnormal symmetry right;Suspected CN VII (facial) dysfunction Facial Strength: Reduced right;Suspected CN VII (facial) dysfunction Facial Sensation: Reduced right;Suspected CN V (Trigeminal) dysfunction Lingual ROM:  (unable to protrude) Motor Speech Overall Motor Speech: Appears within functional limits for tasks assessed Respiration: Within functional limits Phonation: Normal Resonance: Within functional limits Intelligibility: Intelligible Motor Planning: Impaired Level of Impairment: Word   GO                    [name] 07/12/2020, 1:32 PM  Orbie Pyo Ewelina Naves M.Ed Risk analyst [phone] Office [phone]

## 2020-07-12 NOTE — Progress Notes (Signed)
Results for JONTE, REINSCHMIDT (MRN [id number]) as of 07/12/2020 13:10  Ref. Range 07/12/2020 03:05 07/12/2020 07:29 07/12/2020 08:01 07/12/2020 11:16 07/12/2020 11:51  Glucose-Capillary Latest Ref Range: 70 - 99 mg/dL 182 (H) 28 (LL) 99 36 (LL) 134 (H)  Noted that CBGs have been less than 70 mg/dl today.  Recommend decreasing Novolog to SENSITIVE correction scale. Will continue to monitor blood sugars while in the hospital.  Harvel Ricks RN BSN CDE Diabetes Coordinator Pager: [phone]  8am-5pm

## 2020-07-12 NOTE — Progress Notes (Signed)
Referring Physician(s): Code stroke- Absher, Minette Brine (neurology)  Supervising Physician: Pedro Earls  Patient Status:  [facility] - In-pt  Chief Complaint: Speech  Subjective:  History of acute CVA s/p cerebral arteriogram with emergent mechanical thrombectomy of left MCA M2 occlusion achieving a TICI 3 revascularization via left femoral approach 07/11/2020 by Dr. Karenann Cai. Patient laying in bed resting comfortably undergoing ECHO bedside. She opens eyes to voice and follows simple commands. Amy, RN at bedside. Speech very dysarthric. Can spontaneously move all extremities with weakness of right side. Left femoral puncture site c/d/i.  MR brain this AM: 1. Acute ischemic infarcts within the left MCA and PCA territories with a small amount of petechial blood products in the left basal ganglia. No midline shift or other mass effect.   Allergies: Patient has no known allergies.  Medications: Prior to Admission medications   Medication Sig Start Date End Date Taking? Authorizing Provider  amitriptyline (ELAVIL) 25 MG tablet Take 25 mg by mouth at bedtime. 06/16/20  Yes [provider]  amLODipine (NORVASC) 10 MG tablet Take 10 mg by mouth daily. 06/16/20  Yes [provider]  atorvastatin (LIPITOR) 80 MG tablet Take 80 mg by mouth every evening. 04/06/19  Yes [provider]  celecoxib (CELEBREX) 100 MG capsule Take 100 mg by mouth daily. 06/16/20  Yes [provider]  cholecalciferol (VITAMIN D3) 25 MCG (1000 UNIT) tablet Take 1,000 Units by mouth daily.   Yes [provider]  cinacalcet (SENSIPAR) 60 MG tablet Take 60 mg by mouth daily.   Yes [provider]  gabapentin (NEURONTIN) 300 MG capsule Take 300 mg by mouth daily. 04/30/14  Yes [provider]  HUMALOG 100 UNIT/ML injection Inject 3-13 Units into the skin 3 (three) times daily before meals. Per sliding scale 06/25/20  Yes [provider]  insulin glargine (LANTUS) 100 UNIT/ML Solostar Pen Inject 80 Units into the skin at bedtime. 02/22/18  Yes [provider]  losartan (COZAAR) 50 MG tablet Take 50 mg by mouth daily. 06/16/20  Yes [provider]  sevelamer carbonate (RENVELA) 800 MG tablet Take 800 mg by mouth 3 (three) times daily. 06/16/20  Yes [provider]  SYNTHROID 75 MCG tablet Take 75 mcg by mouth daily. 06/16/20  Yes [provider]  torsemide (DEMADEX) 20 MG tablet Take 20 mg by mouth daily. 06/16/20  Yes [provider]  vitamin B-12 (CYANOCOBALAMIN) 1000 MCG tablet Take 2,000 mcg by mouth daily.   Yes [provider]  vitamin E 180 MG (400 UNITS) capsule Take 400 Units by mouth daily.   Yes [provider]     Vital Signs: BP 131/73   Pulse 74   Temp 99.1 F (37.3 C) (Oral)   Resp 13   Wt 255 lb 1.2 oz (115.7 kg)   SpO2 100%   Physical Exam Vitals and nursing note reviewed.  Constitutional:      General: She is not in acute distress. Pulmonary:     Effort: Pulmonary effort is normal.  Skin:    General: Skin is warm and dry.     Comments: Left femoral puncture site soft without active bleeding or hematoma.  Neurological:     Mental Status: She is alert.     Comments: Alert and awake. Speech very dysarthric but follows simple commands. PERRL bilaterally. Can spontaneously move all extremities with weakness of right side (can lift RUE but cannot hold as long as  LUE, can lift LLE but cannot hold LLE). Distal pulses (DPs) 1+ bilaterally.     Imaging: CT Code Stroke CTA Head W/WO contrast  Result Date: 07/11/2020 CLINICAL DATA:  Right-sided deficits, slurred speech EXAM: CT HEAD WITHOUT CONTRAST CT ANGIOGRAPHY HEAD AND NECK TECHNIQUE: Multidetector CT imaging of the head was performed without contrast. Multidetector CT imaging of the head and neck was performed using the standard protocol during bolus administration of intravenous  contrast. Multiplanar CT image reconstructions and MIPs were obtained to evaluate the vascular anatomy. Carotid stenosis measurements (when applicable) are obtained utilizing NASCET criteria, using the distal internal carotid diameter as the denominator. CONTRAST:  100 mL Omnipaque 350 COMPARISON:  None. FINDINGS: CT HEAD FINDINGS Brain: There is no acute intracranial hemorrhage. Hypoattenuation with loss of gray-white differentiation in the parasagittal left occipital lobe. Possible loss of gray-white differentiation along the body of the left caudate. Ventricles and sulci are within normal limits in size and configuration. Vascular: No hyperdense vessel. Skull: Unremarkable. Sinuses/Orbits: Paranasal sinus mucosal thickening. No significant orbital abnormality. Other: Mastoid air cells are clear. ASPECTS ([place] Stroke Program Early CT Score) - Ganglionic level infarction (caudate, lentiform nuclei, internal capsule, insula, M1-M3 cortex): 6 - Supraganglionic infarction (M4-M6 cortex): 3 Total score (0-10 with 10 being normal): 9 Review of the MIP images confirms the above findings CTA NECK FINDINGS Aortic arch: Mild calcified plaque along the aortic arch. Patent great vessel origins. Right carotid system: Patent. Calcified plaque at the ICA origin without measurable stenosis. Left carotid system: Patent. Calcified plaque at the ICA origin without measurable stenosis. Vertebral arteries: Patent and codominant. Skeleton: Degenerative changes of the cervical spine. Other neck: No mass or adenopathy. Upper chest: No apical lung mass. Review of the MIP images confirms the above findings CTA HEAD FINDINGS Anterior circulation: Intracranial internal carotid arteries are patent with calcified plaque causing mild stenosis. Proximal left M1 MCA is patent. There is nonocclusive thrombus at the bifurcation. There is occlusion of a proximal M3 branch. Right middle and both anterior cerebral arteries are patent. Posterior  circulation: Intracranial vertebral arteries, basilar artery, and posterior cerebral arteries are patent. A left posterior communicating artery is present. Venous sinuses: As permitted by contrast timing, patent. Review of the MIP images confirms the above findings IMPRESSION: Acute left occipital lobe infarction. Suspected acute infarction of the body of the left caudate. No large vessel occlusion or hemodynamically significant stenosis in the neck. Nonocclusive thrombus at the left MCA bifurcation. Occlusion of a proximal left M3 MCA branch. Patent proximal left PCA. These results were communicated to Dr. Charlsie Merles at 2:25 pm on 07/11/2020 by text page via the [facility] messaging system. Electronically Signed   By: Macy Mis M.D.   On: 07/11/2020 14:51   CT Code Stroke CTA Neck W/WO contrast  Result Date: 07/11/2020 CLINICAL DATA:  Right-sided deficits, slurred speech EXAM: CT HEAD WITHOUT CONTRAST CT ANGIOGRAPHY HEAD AND NECK TECHNIQUE: Multidetector CT imaging of the head was performed without contrast. Multidetector CT imaging of the head and neck was performed using the standard protocol during bolus administration of intravenous contrast. Multiplanar CT image reconstructions and MIPs were obtained to evaluate the vascular anatomy. Carotid stenosis measurements (when applicable) are obtained utilizing NASCET criteria, using the distal internal carotid diameter as the denominator. CONTRAST:  100 mL Omnipaque 350 COMPARISON:  None. FINDINGS: CT HEAD FINDINGS Brain: There is no acute intracranial hemorrhage. Hypoattenuation with loss of gray-white differentiation in the parasagittal left occipital lobe. Possible loss of gray-white differentiation along  the body of the left caudate. Ventricles and sulci are within normal limits in size and configuration. Vascular: No hyperdense vessel. Skull: Unremarkable. Sinuses/Orbits: Paranasal sinus mucosal thickening. No significant orbital abnormality. Other: Mastoid air  cells are clear. ASPECTS ([place] Stroke Program Early CT Score) - Ganglionic level infarction (caudate, lentiform nuclei, internal capsule, insula, M1-M3 cortex): 6 - Supraganglionic infarction (M4-M6 cortex): 3 Total score (0-10 with 10 being normal): 9 Review of the MIP images confirms the above findings CTA NECK FINDINGS Aortic arch: Mild calcified plaque along the aortic arch. Patent great vessel origins. Right carotid system: Patent. Calcified plaque at the ICA origin without measurable stenosis. Left carotid system: Patent. Calcified plaque at the ICA origin without measurable stenosis. Vertebral arteries: Patent and codominant. Skeleton: Degenerative changes of the cervical spine. Other neck: No mass or adenopathy. Upper chest: No apical lung mass. Review of the MIP images confirms the above findings CTA HEAD FINDINGS Anterior circulation: Intracranial internal carotid arteries are patent with calcified plaque causing mild stenosis. Proximal left M1 MCA is patent. There is nonocclusive thrombus at the bifurcation. There is occlusion of a proximal M3 branch. Right middle and both anterior cerebral arteries are patent. Posterior circulation: Intracranial vertebral arteries, basilar artery, and posterior cerebral arteries are patent. A left posterior communicating artery is present. Venous sinuses: As permitted by contrast timing, patent. Review of the MIP images confirms the above findings IMPRESSION: Acute left occipital lobe infarction. Suspected acute infarction of the body of the left caudate. No large vessel occlusion or hemodynamically significant stenosis in the neck. Nonocclusive thrombus at the left MCA bifurcation. Occlusion of a proximal left M3 MCA branch. Patent proximal left PCA. These results were communicated to Dr. Charlsie Merles at 2:25 pm on 07/11/2020 by text page via the [facility] messaging system. Electronically Signed   By: Macy Mis M.D.   On: 07/11/2020 14:51   MR BRAIN WO CONTRAST  Result  Date: 07/12/2020 CLINICAL DATA:  Acute infarct EXAM: MRI HEAD WITHOUT CONTRAST TECHNIQUE: Multiplanar, multiecho pulse sequences of the brain and surrounding structures were obtained without intravenous contrast. COMPARISON:  Head CT 07/11/2020 FINDINGS: Brain: There is multifocal acute ischemia the left MCA and PCA territories, including a large MCA territory infarct that involves the corpus striatum, insula anterior temporal cortex and left frontal operculum. Petechial blood products at the left basal ganglia. There is moderate cytotoxic edema. The midline structures are normal. Vascular: Major flow voids are preserved. Skull and upper cervical spine: Normal calvarium and skull base. Visualized upper cervical spine and soft tissues are normal. Sinuses/Orbits:No paranasal sinus fluid levels or advanced mucosal thickening. No mastoid or middle ear effusion. Normal orbits. IMPRESSION: Acute ischemic infarcts within the left MCA and PCA territories with a small amount of petechial blood products in the left basal ganglia. No midline shift or other mass effect. Electronically Signed   By: Ulyses Jarred M.D.   On: 07/12/2020 02:11   DG Pelvis Portable  Result Date: 07/12/2020 CLINICAL DATA:  MRI clearance EXAM: PORTABLE PELVIS 1-2 VIEWS COMPARISON:  None. FINDINGS: There is no evidence of pelvic fracture or diastasis. No pelvic bone lesions are seen. IMPRESSION: Negative.  No metallic foreign body. Electronically Signed   By: Ulyses Jarred M.D.   On: 07/12/2020 01:33   DG CHEST PORT 1 VIEW  Result Date: 07/12/2020 CLINICAL DATA:  MRI screening EXAM: PORTABLE CHEST 1 VIEW COMPARISON:  None. FINDINGS: The right side of the image is labeled incorrectly. There is mild cardiomegaly with left basilar  mild opacity. No metallic foreign body. IMPRESSION: 1. No metallic foreign body. Electronically Signed   By: Ulyses Jarred M.D.   On: 07/12/2020 01:35   DG Abd Portable 1V  Result Date: 07/12/2020 CLINICAL DATA:  MRI  clearance EXAM: PORTABLE ABDOMEN - 1 VIEW COMPARISON:  None. FINDINGS: The bowel gas pattern is normal. No radio-opaque calculi or other significant radiographic abnormality are seen. IMPRESSION: No metallic foreign body. Electronically Signed   By: Ulyses Jarred M.D.   On: 07/12/2020 01:35   CT HEAD CODE STROKE WO CONTRAST  Result Date: 07/11/2020 CLINICAL DATA:  Right-sided deficits, slurred speech EXAM: CT HEAD WITHOUT CONTRAST CT ANGIOGRAPHY HEAD AND NECK TECHNIQUE: Multidetector CT imaging of the head was performed without contrast. Multidetector CT imaging of the head and neck was performed using the standard protocol during bolus administration of intravenous contrast. Multiplanar CT image reconstructions and MIPs were obtained to evaluate the vascular anatomy. Carotid stenosis measurements (when applicable) are obtained utilizing NASCET criteria, using the distal internal carotid diameter as the denominator. CONTRAST:  100 mL Omnipaque 350 COMPARISON:  None. FINDINGS: CT HEAD FINDINGS Brain: There is no acute intracranial hemorrhage. Hypoattenuation with loss of gray-white differentiation in the parasagittal left occipital lobe. Possible loss of gray-white differentiation along the body of the left caudate. Ventricles and sulci are within normal limits in size and configuration. Vascular: No hyperdense vessel. Skull: Unremarkable. Sinuses/Orbits: Paranasal sinus mucosal thickening. No significant orbital abnormality. Other: Mastoid air cells are clear. ASPECTS ([place] Stroke Program Early CT Score) - Ganglionic level infarction (caudate, lentiform nuclei, internal capsule, insula, M1-M3 cortex): 6 - Supraganglionic infarction (M4-M6 cortex): 3 Total score (0-10 with 10 being normal): 9 Review of the MIP images confirms the above findings CTA NECK FINDINGS Aortic arch: Mild calcified plaque along the aortic arch. Patent great vessel origins. Right carotid system: Patent. Calcified plaque at the ICA  origin without measurable stenosis. Left carotid system: Patent. Calcified plaque at the ICA origin without measurable stenosis. Vertebral arteries: Patent and codominant. Skeleton: Degenerative changes of the cervical spine. Other neck: No mass or adenopathy. Upper chest: No apical lung mass. Review of the MIP images confirms the above findings CTA HEAD FINDINGS Anterior circulation: Intracranial internal carotid arteries are patent with calcified plaque causing mild stenosis. Proximal left M1 MCA is patent. There is nonocclusive thrombus at the bifurcation. There is occlusion of a proximal M3 branch. Right middle and both anterior cerebral arteries are patent. Posterior circulation: Intracranial vertebral arteries, basilar artery, and posterior cerebral arteries are patent. A left posterior communicating artery is present. Venous sinuses: As permitted by contrast timing, patent. Review of the MIP images confirms the above findings IMPRESSION: Acute left occipital lobe infarction. Suspected acute infarction of the body of the left caudate. No large vessel occlusion or hemodynamically significant stenosis in the neck. Nonocclusive thrombus at the left MCA bifurcation. Occlusion of a proximal left M3 MCA branch. Patent proximal left PCA. These results were communicated to Dr. Charlsie Merles at 2:25 pm on 07/11/2020 by text page via the [facility] messaging system. Electronically Signed   By: Macy Mis M.D.   On: 07/11/2020 14:51    Labs:  CBC: Recent Labs    07/11/20 1350 07/11/20 1446  WBC 6.4  --   HGB 12.6 13.9  HCT 40.9 41.0  PLT 179  --     COAGS: Recent Labs    07/11/20 1350  INR 1.0  APTT 28    BMP: Recent Labs    07/11/20  1350 07/11/20 1446 07/11/20 2019  NA 133* 133* 130*  K 5.9* 5.9* 6.0*  CL 96* 98 97*  CO2 24  --  18*  GLUCOSE 318* 320* 488*  BUN 22 28* 27*  CALCIUM 9.1  --  8.0*  CREATININE 6.16* 6.10* 6.32*  GFRNONAA 7*  --  7*    LIVER FUNCTION TESTS: Recent Labs     07/11/20 1350 07/11/20 2019  BILITOT 0.2*  --   AST 25  --   ALT 17  --   ALKPHOS 94  --   PROT 8.0  --   ALBUMIN 4.2 3.4*    Assessment and Plan:  History of acute CVA s/p cerebral arteriogram with emergent mechanical thrombectomy of left MCA M2 occlusion achieving a TICI 3 revascularization via left femoral approach 07/11/2020 by Dr. Karenann Cai. Patient's condition stable- awake and alert, follows simple commands, speech very dysarthric, can spontaneously move all extremities with weakness of right side. Left femoral puncture site stable, distal pulses (DPs) 1+ bilaterally. Plan to follow-up with Dr. Karenann Cai in clinic 4 weeks after discharge ([facility] schedulers to call patient to set up this appointment). Further plans per neurology- appreciate and agree with management. Please call [facility] with questions/concerns.   Electronically Signed: Earley Abide, PA-C 07/12/2020, 10:01 AM   I spent a total of 25 Minutes at the the patient's bedside AND on the patient's hospital floor or unit, greater than 50% of which was counseling/coordinating care for CVA s/p revascularization.

## 2020-07-12 NOTE — Evaluation (Signed)
Occupational Therapy Evaluation Patient Details Name: Rachael Herrera MRN: [id number] DOB: 04/02/1961 Today's Date: 07/12/2020    History of Present Illness The pt is a 60 yo female presenting with right-sided weakenss and gaze preference. 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 Pt s/p thrombectomy 2/17.  PMH includes: DM II, OSA, CKD IV, HTN, HLD, and morbid obesity.   Clinical Impression   PT admitted with L MCA. Pt currently with functional limitiations due to the deficits listed below (see OT problem list). Pt currently with aphasia and balance deficits. Pt with high fall risk for all adls. Pt could benefit from CIR to reach S for all adls and decrease caregiver burden.  Pt will benefit from skilled OT to increase their independence and safety with adls and balance to allow discharge CIR.     Follow Up Recommendations  CIR    Equipment Recommendations  3 in 1 bedside commode;Other (comment) (RW with platform)    Recommendations for Other Services Rehab consult     Precautions / Restrictions Precautions Precautions: Fall      Mobility Bed Mobility Overal bed mobility: Needs Assistance Bed Mobility: Supine to Sit;Rolling Rolling: Min assist   Supine to sit: Min assist     General bed mobility comments: pt exiting on L side of the bed and needs (A) to help R LE progress to EOB. Pt initiated but unable to finish the adduction of R LE due to body habitus and R foot caught on bed sheet. Pt could not problem solve repositioning to help R LE progress    Transfers Overall transfer level: Needs assistance Equipment used: 2 person hand held assist Transfers: Sit to/from Stand;Stand Pivot Transfers Sit to Stand: +2 physical assistance;Mod assist Stand pivot transfers: +2 physical assistance;Mod assist       General transfer comment: pt with close guarding of R LE and sustained full extension of R knee. Question hyper extension  happening but did not visualize during transfer.    Balance Overall balance assessment: Needs assistance Sitting-balance support: Single extremity supported;Feet supported Sitting balance-Leahy Scale: Fair     Standing balance support: Bilateral upper extremity supported;During functional activity Standing balance-Leahy Scale: Poor Standing balance comment: reliant on support of therapist                           ADL either performed or assessed with clinical judgement   ADL Overall ADL's : Needs assistance/impaired Eating/Feeding: Moderate assistance Eating/Feeding Details (indicate cue type and reason): spilling cup tryign to pick it up Grooming: Wash/dry face;Minimal assistance;Sitting Grooming Details (indicate cue type and reason): only washing L side of face. pt with wash cloth placed on face to see if pt would remove it due to communication deficits. pt shaking head to make it fall instead of using either arm Upper Body Bathing: Maximal assistance   Lower Body Bathing: Moderate assistance   Upper Body Dressing : Moderate assistance   Lower Body Dressing: Moderate assistance   Toilet Transfer: +2 for physical assistance;Moderate assistance;Stand-pivot Toilet Transfer Details (indicate cue type and reason): simulate EOB to chair           General ADL Comments: pt motivated by [employer]. pt with x4 solitaire games loaded on ipad.     Vision   Vision Assessment?: Vision impaired- to be further tested in functional context;Yes Eye Alignment: Impaired (comment) Tracking/Visual Pursuits: Requires cues, head turns, or add  eye shifts to track;Impaired - to be further tested in functional context Additional Comments: decreased visual attention to the R sdie     Perception Perception Perception Tested?: Yes Perception Deficits: Inattention/neglect Inattention/Neglect: Does not attend to right visual field;Does not attend to right side of body   Praxis       Pertinent Vitals/Pain Pain Assessment: No/denies pain     Hand Dominance Right   Extremity/Trunk Assessment Upper Extremity Assessment Upper Extremity Assessment: RUE deficits/detail RUE Deficits / Details: AROM 80 shoulder flexion with automatic task and strong encouragement. Pt decreased grasp and using L hand instead of using R UE for task RUE Coordination: decreased fine motor;decreased gross motor   Lower Extremity Assessment Lower Extremity Assessment: Defer to PT evaluation   Cervical / Trunk Assessment Cervical / Trunk Assessment: Other exceptions (rounded shoulders due to body habitus)   Communication Communication Communication: Receptive difficulties;Expressive difficulties   Cognition Arousal/Alertness: Awake/alert Behavior During Therapy: Impulsive Overall Cognitive Status: Impaired/Different from baseline Area of Impairment: Following commands;Safety/judgement;Awareness                       Following Commands: Follows one step commands inconsistently;Follows one step commands with increased time Safety/Judgement: Decreased awareness of safety;Decreased awareness of deficits Awareness: Intellectual   General Comments: pt with inaccurate yes no responses to questions. pt giggling at times during session not related to what is happening. pt attempting to progress transfer when given cues to wait. pt attempting to play solitaire on ipad with errors and repeating the same step. pt makes a happy awwwh sound when Ot helps pt make a successful move on the game   General Comments  VSS    Exercises     Shoulder Instructions      Home Living Family/patient expects to be discharged to:: Unsure                                        Prior Functioning/Environment Level of Independence: Independent                 OT Problem List: Decreased strength;Decreased range of motion;Decreased activity tolerance;Impaired balance (sitting  and/or standing);Impaired vision/perception;Decreased coordination;Decreased cognition;Decreased safety awareness;Decreased knowledge of use of DME or AE;Decreased knowledge of precautions;Cardiopulmonary status limiting activity;Impaired sensation;Obesity;Impaired UE functional use      OT Treatment/Interventions: Self-care/ADL training;Therapeutic exercise;Neuromuscular education;Energy conservation;DME and/or AE instruction;Manual therapy;Modalities;Therapeutic activities;Cognitive remediation/compensation;Visual/perceptual remediation/compensation;Patient/family education;Balance training    OT Goals(Current goals can be found in the care plan section) Acute Rehab OT Goals Patient Stated Goal: none stated due to expressive deficits. pt does state "thank you" as therapist exiting room OT Goal Formulation: With patient Time For Goal Achievement: 07/26/20 Potential to Achieve Goals: Good  OT Frequency: Min 2X/week   Barriers to D/C:            Co-evaluation PT/OT/SLP Co-Evaluation/Treatment: Yes Reason for Co-Treatment: To address functional/ADL transfers;For patient/therapist safety;Necessary to address cognition/behavior during functional activity   OT goals addressed during session: ADL's and self-care;Proper use of Adaptive equipment and DME      AM-PAC OT "6 Clicks" Daily Activity     Outcome Measure Help from another person eating meals?: A Lot Help from another person taking care of personal grooming?: A Lot Help from another person toileting, which includes using toliet, bedpan, or urinal?: A Lot Help from another person bathing (including washing, rinsing, drying)?:  A Lot Help from another person to put on and taking off regular upper body clothing?: A Lot Help from another person to put on and taking off regular lower body clothing?: A Lot 6 Click Score: 12   End of Session Nurse Communication: Mobility status;Precautions  Activity Tolerance: Patient tolerated treatment  well Patient left: in chair;with call bell/phone within reach;with chair alarm set  OT Visit Diagnosis: Unsteadiness on feet (R26.81);Muscle weakness (generalized) (M62.81)                Time: [id number] OT Time Calculation (min): 29 min Charges:  OT General Charges $OT Visit: 1 Visit OT Evaluation $OT Eval Moderate Complexity: 1 Mod   Brynn, OTR/L  Acute Rehabilitation Services Pager: [phone] Office: [phone] .   Jeri Modena 07/12/2020, 3:54 PM

## 2020-07-12 NOTE — Evaluation (Signed)
Physical Therapy Evaluation Patient Details Name: Rachael Herrera MRN: [id number] DOB: 12-Feb-1961 Today's Date: 07/12/2020   History of Present Illness  The pt is a 60 yo female presenting with right-sided weakenss and gaze preference. 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 Pt s/p thrombectomy 2/17.  PMH includes: DM II, OSA, CKD IV, HTN, HLD, and morbid obesity.  Clinical Impression  Pt in bed upon arrival of PT, agreeable to evaluation at this time. Pt unable to give reliable history of prior level of mobility or assist needed for ADLs given aphasia, and no family present at this time. The pt now presents with limitations in functional mobility, strength, coordination, power, and stability due to above dx, and will continue to benefit from skilled PT to address these deficits. The pt was able to come to sit EOB where she was able to sustain seated balance without assist. However, the pt required BUE support to steady and support during static stance as well as for lateral steps from bed to recliner. The pt was able to generate minimal clearance only at this time and benefits from some manual facilitation to complete wt shift and stepping. The pt will continue to benefit from skilled PT acutely and following d/c to facilitate return to prior level of mobility and independence.      Follow Up Recommendations CIR    Equipment Recommendations   (defer to post acute)    Recommendations for Other Services Rehab consult     Precautions / Restrictions Precautions Precautions: Fall Restrictions Weight Bearing Restrictions: No      Mobility  Bed Mobility Overal bed mobility: Needs Assistance Bed Mobility: Supine to Sit;Rolling Rolling: Min assist   Supine to sit: Min assist     General bed mobility comments: pt exiting on L side of the bed and needs (A) to help R LE progress to EOB. Pt initiated but unable to finish the adduction of R LE  due to body habitus and R foot caught on bed sheet. Pt could not problem solve repositioning to help R LE progress    Transfers Overall transfer level: Needs assistance Equipment used: 2 person hand held assist Transfers: Sit to/from Stand;Stand Pivot Transfers Sit to Stand: +2 physical assistance;Mod assist Stand pivot transfers: +2 physical assistance;Mod assist       General transfer comment: pt with close guarding of R LE and sustained full extension of R knee. Question hyper extension happening but did not visualize during transfer.  Ambulation/Gait Ambulation/Gait assistance: Mod assist;+2 physical assistance Gait Distance (Feet): 5 Feet Assistive device: 2 person hand held assist Gait Pattern/deviations: Step-to pattern;Decreased stride length Gait velocity: decreased   General Gait Details: pt with small lateral steps to recliner, able to generate minimal clearance bilaterally, but with increased reliance on BUE support from therapists. no instances of knee buckling  Stairs            Wheelchair Mobility    Modified Rankin (Stroke Patients Only) Modified Rankin (Stroke Patients Only) Pre-Morbid Rankin Score: No significant disability Modified Rankin: Moderately severe disability     Balance Overall balance assessment: Needs assistance Sitting-balance support: Single extremity supported;Feet supported Sitting balance-Leahy Scale: Fair     Standing balance support: Bilateral upper extremity supported;During functional activity Standing balance-Leahy Scale: Poor Standing balance comment: reliant on support of therapist  Pertinent Vitals/Pain Pain Assessment: No/denies pain    Home Living Family/patient expects to be discharged to:: Unsure                 Additional Comments: pt unable to assist with history due to aphasia, no family present    Prior Function Level of Independence: Independent          Comments: pt driving herself to dialysis prior to admission     Hand Dominance   Dominant Hand: Right    Extremity/Trunk Assessment   Upper Extremity Assessment Upper Extremity Assessment: Defer to OT evaluation RUE Deficits / Details: AROM 80 shoulder flexion with automatic task and strong encouragement. Pt decreased grasp and using L hand instead of using R UE for task RUE Coordination: decreased fine motor;decreased gross motor    Lower Extremity Assessment Lower Extremity Assessment: Generalized weakness (pt slow to move BLE, but able to use funtionally for transfer. MMT limited due to impariged cognition)    Cervical / Trunk Assessment Cervical / Trunk Assessment: Other exceptions Cervical / Trunk Exceptions: rounded shoulders due to large body habitus  Communication   Communication: Receptive difficulties;Expressive difficulties  Cognition Arousal/Alertness: Awake/alert Behavior During Therapy: Impulsive Overall Cognitive Status: Impaired/Different from baseline Area of Impairment: Following commands;Safety/judgement;Awareness                       Following Commands: Follows one step commands inconsistently;Follows one step commands with increased time Safety/Judgement: Decreased awareness of safety;Decreased awareness of deficits Awareness: Intellectual   General Comments: pt with inaccurate yes no responses to questions. pt giggling at times during session not related to what is happening. pt attempting to progress transfer when given cues to wait. pt attempting to play solitaire on ipad with errors and repeating the same step. pt makes a happy awwwh sound when Ot helps pt make a successful move on the game      General Comments General comments (skin integrity, edema, etc.): VSS    Exercises General Exercises - Lower Extremity Long Arc Quad: AROM;Both;10 reps;Seated Hip Flexion/Marching: AROM;Both;5 reps;Seated   Assessment/Plan    PT Assessment  Patient needs continued PT services  PT Problem List Decreased strength;Decreased balance;Decreased activity tolerance;Decreased mobility;Decreased coordination;Decreased cognition;Decreased knowledge of use of DME;Decreased safety awareness       PT Treatment Interventions DME instruction;Gait training;Stair training;Functional mobility training;Therapeutic activities;Therapeutic exercise;Balance training;Neuromuscular re-education;Cognitive remediation;Patient/family education    PT Goals (Current goals can be found in the Care Plan section)  Acute Rehab PT Goals Patient Stated Goal: none stated due to expressive deficits. pt does state "thank you" as therapist exiting room PT Goal Formulation: With patient Time For Goal Achievement: 07/26/20 Potential to Achieve Goals: Good    Frequency Min 4X/week   Barriers to discharge        Co-evaluation PT/OT/SLP Co-Evaluation/Treatment: Yes Reason for Co-Treatment: For patient/therapist safety;To address functional/ADL transfers PT goals addressed during session: Mobility/safety with mobility;Balance;Proper use of DME OT goals addressed during session: ADL's and self-care;Proper use of Adaptive equipment and DME       AM-PAC PT "6 Clicks" Mobility  Outcome Measure Help needed turning from your back to your side while in a flat bed without using bedrails?: A Little Help needed moving from lying on your back to sitting on the side of a flat bed without using bedrails?: A Little Help needed moving to and from a bed to a chair (including a wheelchair)?: A Lot Help needed standing up from a  chair using your arms (e.g., wheelchair or bedside chair)?: A Little Help needed to walk in hospital room?: A Lot Help needed climbing 3-5 steps with a railing? : A Lot 6 Click Score: 15    End of Session Equipment Utilized During Treatment: Gait belt Activity Tolerance: Patient tolerated treatment well Patient left: in chair;with call bell/phone  within reach;with chair alarm set Nurse Communication: Mobility status PT Visit Diagnosis: Muscle weakness (generalized) (M62.81);Other abnormalities of gait and mobility (R26.89)    Time: 1343-1411 PT Time Calculation (min) (ACUTE ONLY): 28 min   Charges:   PT Evaluation $PT Eval Moderate Complexity: 1 Mod          Karma Ganja, PT, DPT   Acute Rehabilitation Department Pager #: [phone]  Otho Bellows 07/12/2020, 6:20 PM

## 2020-07-12 NOTE — Progress Notes (Signed)
0728 CBG 36 by fingerstick, recheck 28 by fingerstick.  Dr. Charlsie Merles paged and 1 amp of D50 given IV.  Will recheck sugar 15 mins post D50 administration.  Verbal order from Dr. Charlsie Merles to hold insulin at this time and recheck CBG.

## 2020-07-12 NOTE — Progress Notes (Signed)
ANTICOAGULATION CONSULT NOTE - Initial Consult  Pharmacy Consult for heparin Indication: atrial fibrillation  No Known Allergies  Patient Measurements: Height: '5\' 7"'$  (170.2 cm) Weight: 115.7 kg (255 lb 1.2 oz) IBW/kg (Calculated) : 61.6 Heparin Dosing Weight: 88kg  Vital Signs: Temp: 98.1 F (36.7 C) (02/18 1200) Temp Source: Axillary (02/18 1200) BP: 100/63 (02/18 1400) Pulse Rate: 85 (02/18 1400)  Labs: Recent Labs    07/11/20 1350 07/11/20 1446 07/11/20 2019 07/12/20 1141  HGB 12.6 13.9  --   --   HCT 40.9 41.0  --   --   PLT 179  --   --   --   APTT 28  --   --   --   LABPROT 12.7  --   --   --   INR 1.0  --   --   --   CREATININE 6.16* 6.10* 6.32* 8.13*    Estimated Creatinine Clearance: 9.5 mL/min (A) (by C-G formula based on SCr of 8.13 mg/dL (H)).   Medical History: History reviewed. No pertinent past medical history.  Assessment: 72 YOF presenting as code stroke, MCA infarct, no tPA and TICI3 in IR.  Now with new onset aflutter found, [id number] 6.  Not on anticoagulation PTA, DVT ppx SQ heparin given '@1340'$ .    Goal of Therapy:  Heparin level 0.3-0.5 units/ml Monitor platelets by anticoagulation protocol: Yes   Plan:  Heparin gtt at 1100 units/hr, no bolus F/u 8 hour heparin level F/u ability to transition to PO DOAC  Bertis Ruddy, PharmD Clinical Pharmacist Please check AMION for all [place] numbers 07/12/2020 2:45 PM

## 2020-07-12 NOTE — Consult Note (Signed)
Cardiology Consultation:   Patient ID: YANERI SORTOR MRN: [id number]; DOB: 09-29-1960  Admit date: 07/11/2020 Date of Consult: 07/12/2020  PCP:  No primary care provider on file.   [place]  Cardiologist:  Charise Killian, NP EP: Dr. Minna Merritts   Patient Profile:   Rachael Herrera is a 60 y.o. female with a hx of ESRF on HD (notes discuss tend to have hypotension with HD), HTN, HLD, DM, obesity, OSA w/CPAP, hypothyroidism, PVD with reported stable claudication b/l, she follows with cardiology service at [facility] for hx of NSVT/PVCs, and bradycardia, fe.t to have tachy-brady with a recent aborted PPM implant, planned for MICRA down the road  who is being seen today for the evaluation of stroke, hx of rhythm abnormalities at the request of Dr Leonie Man.  History of Present Illness:   Rachael Herrera was admitted to [facility] 03/28/20 for pacemaker implant but was unsuccessful, with plans is to bring her back to the hospital in 3 weeks or so for [facility] pacemaker placement.  Dr. Talbot Grumbling procedure note 03/28/20 states: Procedure(s):  Attempt was made to implant dual-chamber pacemaker from right axillary or subclavian vein since patient had fistula for dialysis left arm, however right septic axillary or subclavian vein was not accessible to cannulation. We did under ultrasound guidance, and also contrast with Seldinger technique, a cutdown was not successful we consulted vascular surgeon they attempted but however was not able to access to axillary or subclavian vein. We canceled the case and we are going to bring her back proceed with Micra leadless pacemaker from right femoral vein.  She was hospitalized according to the H&P here 07/09/20 in the ED at [facility] for another episode that occurred during dialysis. She became bradycardic and went "blind in the left eye." A CT head was suggested but she left without this test. She had bradycardia and dizziness at the  time, perhaps precipitating the spell. Plans were initiated to have her pacemaker replaced. The note from that visit states, "Dr. Myles Rosenthal was consulted and stated that patient was very symptomatic when she got bradycardic this morning a.m. wanted [place] to make sure to possibly get a CT of her head and admit her to the hospital and let Dr. Minna Merritts See her and decide if he wants to put in a pacemaker now. Dr. Minna Merritts agreed to this plan but the patient stated that she is not going to stay in the hospital, and she will call Dr. Talbot Grumbling office and make arrangements for him to redo her pacemaker, and refused to stay in the hospital or complete CT with resolution of her symptoms  She was admitted to [facility] yesterday with R sided weakness, gaze preference and aphasia found via CTA head:  With left MCA bifurcation abnormal with branch occlusion (to my eye). No flow in left PCA. Planned for  interventional radiology, thrombectomy, she was outside the time window for tPA.  EP is asked to the case with abnormal rhythm on telemetry and her hx noted above.  Nephrology also on board  LABS K+ 5.9 >> 6.0 BUN/Creat 27/6.32 WBC 6.4 H/H 13/41 Plts 179   The patient is awake and alert, tries to speak with me, but can not her daughter is at beds side. She is scheduled for 2/28 for PPM with Dr. Minna Merritts. Daughter is very involved in our discussion, mentions she thought she knew of hx of a "flutter" and that she was on a blood thinner, though her record  and med list do not suggest either. Pt denies any CP SOB  History reviewed. As above.  Past Surgical History:  Procedure Laterality Date  . RADIOLOGY WITH ANESTHESIA N/A 07/11/2020   Procedure: IR WITH ANESTHESIA;  Surgeon: Radiologist, Medication, MD;  Location: [place];  Service: Radiology;  Laterality: N/A;     Home Medications:  Prior to Admission medications   Medication Sig Start Date End Date Taking? Authorizing Provider  amitriptyline (ELAVIL) 25 MG tablet Take  25 mg by mouth at bedtime. 06/16/20  Yes [provider]  amLODipine (NORVASC) 10 MG tablet Take 10 mg by mouth daily. 06/16/20  Yes [provider]  atorvastatin (LIPITOR) 80 MG tablet Take 80 mg by mouth every evening. 04/06/19  Yes [provider]  celecoxib (CELEBREX) 100 MG capsule Take 100 mg by mouth daily. 06/16/20  Yes [provider]  cholecalciferol (VITAMIN D3) 25 MCG (1000 UNIT) tablet Take 1,000 Units by mouth daily.   Yes [provider]  cinacalcet (SENSIPAR) 60 MG tablet Take 60 mg by mouth daily.   Yes [provider]  gabapentin (NEURONTIN) 300 MG capsule Take 300 mg by mouth daily. 04/30/14  Yes [provider]  HUMALOG 100 UNIT/ML injection Inject 3-13 Units into the skin 3 (three) times daily before meals. Per sliding scale 06/25/20  Yes [provider]  insulin glargine (LANTUS) 100 UNIT/ML Solostar Pen Inject 80 Units into the skin at bedtime. 02/22/18  Yes [provider]  losartan (COZAAR) 50 MG tablet Take 50 mg by mouth daily. 06/16/20  Yes [provider]  sevelamer carbonate (RENVELA) 800 MG tablet Take 800 mg by mouth 3 (three) times daily. 06/16/20  Yes [provider]  SYNTHROID 75 MCG tablet Take 75 mcg by mouth daily. 06/16/20  Yes [provider]  torsemide (DEMADEX) 20 MG tablet Take 20 mg by mouth daily. 06/16/20  Yes [provider]  vitamin B-12 (CYANOCOBALAMIN) 1000 MCG tablet Take 2,000 mcg by mouth daily.   Yes [provider]  vitamin E 180 MG (400 UNITS) capsule Take 400 Units by mouth daily.   Yes [provider]    Inpatient Medications: Scheduled Meds: . amitriptyline  25 mg Oral QHS  . atorvastatin  80 mg Oral Daily  . Chlorhexidine Gluconate Cloth  6 each Topical Daily  . dextrose      . heparin  5,000 Units Subcutaneous Q8H  . insulin aspart  0-15 Units Subcutaneous Q4H  . sodium chloride flush  3 mL Intravenous Once   . sodium zirconium cyclosilicate  20 g Oral Once   Continuous Infusions: . sodium chloride 50 mL/hr at 07/12/20 1000  . clevidipine Stopped (07/11/20 2020)   PRN Meds: acetaminophen **OR** acetaminophen (TYLENOL) oral liquid 160 mg/5 mL **OR** acetaminophen, perflutren lipid microspheres (DEFINITY) IV suspension, senna-docusate  Allergies:   No Known Allergies  Social History:   Social History   Socioeconomic History  . Marital status: Single    Spouse name: Not on file  . Number of children: Not on file  . Years of education: Not on file  . Highest education level: Not on file  Occupational History  . Not on file  Tobacco Use  . Smoking status: Not on file  . Smokeless tobacco: Not on file  Substance and Sexual Activity  . Alcohol use: Not on file  . Drug use: Not on file  . Sexual activity: Not on file  Other Topics Concern  . Not on  file  Social History Narrative  . Not on file   Social Determinants of Health   Financial Resource Strain: Not on file  Food Insecurity: Not on file  Transportation Needs: Not on file  Physical Activity: Not on file  Stress: Not on file  Social Connections: Not on file  Intimate Partner Violence: Not on file    Family History:   History reviewed.   Diabetes Mother  . Heart disease Mother  . Heart attack Mother  . Hypertension Mother  . Diabetes Father  . Heart disease Father  . Hypertension Father  . Stroke Sister  . Hypertension Sister  . Diabetes Sister  . Heart failure Sister  . Diabetes Sister  . Arthritis Sister  . Hypertension Sister     ROS:  Please see the history of present illness.  All other ROS reviewed and negative.     Physical Exam/Data:   Vitals:   07/12/20 0800 07/12/20 0805 07/12/20 0900 07/12/20 1000  BP: 118/64  131/73 131/77  Pulse: 76  74 80  Resp: '13  13 14  '$ Temp: 99.1 F (37.3 C)     TempSrc: Oral     SpO2: (!) 89% 100% 100% 95%  Weight:        Intake/Output Summary (Last 24  hours) at 07/12/2020 1050 Last data filed at 07/12/2020 1000 Gross per 24 hour  Intake 1074.25 ml  Output 10 ml  Net 1064.25 ml   Last 3 Weights 07/11/2020 07/11/2020  Weight (lbs) 255 lb 1.2 oz 261 lb 7.5 oz  Weight (kg) 115.7 kg 118.6 kg     There is no height or weight on file to calculate BMI.  General:  Well nourished, well developed, in no acute distress HEENT: normal Lymph: no adenopathy Neck: no JVD Endocrine:  No thryomegaly Vascular: No carotid bruits; FA pulses 2+ bilaterally without bruits  Cardiac: irreg-irreg, soft SM, no gallops or subs Lungs: CTA b/l, no wheezing, rhonchi or rales  Abd: soft, nontender, obese  Ext: no edema Musculoskeletal:  No deformities Skin: warm and dry  Neuro:  Here with stroke, aphasic, full neuro exam deferred to neurology team Psych:  She seems pleasant and is cooperative    EKG:  The EKG was personally reviewed and demonstrates:    AFlutter 86bpm, QS V1-3, little if any R progression    Telemetry:  Telemetry was personally reviewed and demonstrates:   Predominantly is AFlutter, rates controlled and variable Nothing to suggest CHB She has not had any tachy or braduy events She had a short period with SR, perhaps some junctional beats as well, but again, no bradycardia    Relevant CV Studies:  Echo is ordered, pending Unable to find echo in careverywhere  Via cardiology note: Zio patch monitor March of this year (2021) that showed 41 episodes of SVT, with minimum heart rate of 36 bpm and maximum heart rate of 146 bpm with intermittent episodes of junctional rhythm. It appears that she was symptomatic with the junctional rhythm and had lightheaded and dizziness    Laboratory Data:  High Sensitivity Troponin:  No results for input(s): TROPONINIHS in the last 720 hours.   Chemistry Recent Labs  Lab 07/11/20 1350 07/11/20 1446 07/11/20 2019  NA 133* 133* 130*  K 5.9* 5.9* 6.0*  CL 96* 98 97*  CO2 24  --  18*  GLUCOSE  318* 320* 488*  BUN 22 28* 27*  CREATININE 6.16* 6.10* 6.32*  CALCIUM 9.1  --  8.0*  GFRNONAA 7*  --  7*  ANIONGAP 13  --  15    Recent Labs  Lab 07/11/20 1350 07/11/20 2019  PROT 8.0  --   ALBUMIN 4.2 3.4*  AST 25  --   ALT 17  --   ALKPHOS 94  --   BILITOT 0.2*  --    Hematology Recent Labs  Lab 07/11/20 1350 07/11/20 1446  WBC 6.4  --   RBC 4.12  --   HGB 12.6 13.9  HCT 40.9 41.0  MCV 99.3  --   MCH 30.6  --   MCHC 30.8  --   RDW 15.7*  --   PLT 179  --    BNPNo results for input(s): BNP, PROBNP in the last 168 hours.  DDimer No results for input(s): DDIMER in the last 168 hours.   Radiology/Studies:  CT Code Stroke CTA Head W/WO contrast Result Date: 07/11/2020 CLINICAL DATA:  Right-sided deficits, slurred speech EXAM: CT HEAD WITHOUT CONTRAST CT ANGIOGRAPHY HEAD AND NECK TECHNIQUE: Multidetector CT imaging of the head was performed without contrast. Multidetector CT imaging of the head and neck was performed using the standard protocol during bolus administration of intravenous contrast. Multiplanar CT image reconstructions and MIPs were obtained to evaluate the vascular anatomy. Carotid stenosis measurements (when applicable) are obtained utilizing NASCET criteria, using the distal internal carotid diameter as the denominator. CONTRAST:  100 mL Omnipaque 350 COMPARISON:  None. FINDINGS: CT HEAD FINDINGS Brain: There is no acute intracranial hemorrhage. Hypoattenuation with loss of gray-white differentiation in the parasagittal left occipital lobe. Possible loss of gray-white differentiation along the body of the left caudate. Ventricles and sulci are within normal limits in size and configuration. Vascular: No hyperdense vessel. Skull: Unremarkable. Sinuses/Orbits: Paranasal sinus mucosal thickening. No significant orbital abnormality. Other: Mastoid air cells are clear. ASPECTS ([place] Stroke Program Early CT Score) - Ganglionic level infarction (caudate, lentiform  nuclei, internal capsule, insula, M1-M3 cortex): 6 - Supraganglionic infarction (M4-M6 cortex): 3 Total score (0-10 with 10 being normal): 9 Review of the MIP images confirms the above findings CTA NECK FINDINGS Aortic arch: Mild calcified plaque along the aortic arch. Patent great vessel origins. Right carotid system: Patent. Calcified plaque at the ICA origin without measurable stenosis. Left carotid system: Patent. Calcified plaque at the ICA origin without measurable stenosis. Vertebral arteries: Patent and codominant. Skeleton: Degenerative changes of the cervical spine. Other neck: No mass or adenopathy. Upper chest: No apical lung mass. Review of the MIP images confirms the above findings CTA HEAD FINDINGS Anterior circulation: Intracranial internal carotid arteries are patent with calcified plaque causing mild stenosis. Proximal left M1 MCA is patent. There is nonocclusive thrombus at the bifurcation. There is occlusion of a proximal M3 branch. Right middle and both anterior cerebral arteries are patent. Posterior circulation: Intracranial vertebral arteries, basilar artery, and posterior cerebral arteries are patent. A left posterior communicating artery is present. Venous sinuses: As permitted by contrast timing, patent. Review of the MIP images confirms the above findings IMPRESSION: Acute left occipital lobe infarction. Suspected acute infarction of the body of the left caudate. No large vessel occlusion or hemodynamically significant stenosis in the neck. Nonocclusive thrombus at the left MCA bifurcation. Occlusion of a proximal left M3 MCA branch. Patent proximal left PCA. These results were communicated to Dr. Charlsie Merles at 2:25 pm on 07/11/2020 by text page via the [facility] messaging system. Electronically Signed   By: Macy Mis M.D.   On: 07/11/2020 14:51  MR BRAIN WO CONTRAST Result Date: 07/12/2020 CLINICAL DATA:  Acute infarct EXAM: MRI HEAD WITHOUT CONTRAST TECHNIQUE: Multiplanar, multiecho  pulse sequences of the brain and surrounding structures were obtained without intravenous contrast. COMPARISON:  Head CT 07/11/2020 FINDINGS: Brain: There is multifocal acute ischemia the left MCA and PCA territories, including a large MCA territory infarct that involves the corpus striatum, insula anterior temporal cortex and left frontal operculum. Petechial blood products at the left basal ganglia. There is moderate cytotoxic edema. The midline structures are normal. Vascular: Major flow voids are preserved. Skull and upper cervical spine: Normal calvarium and skull base. Visualized upper cervical spine and soft tissues are normal. Sinuses/Orbits:No paranasal sinus fluid levels or advanced mucosal thickening. No mastoid or middle ear effusion. Normal orbits. IMPRESSION: Acute ischemic infarcts within the left MCA and PCA territories with a small amount of petechial blood products in the left basal ganglia. No midline shift or other mass effect. Electronically Signed   By: Ulyses Jarred M.D.   On: 07/12/2020 02:11    Assessment and Plan:   1. AFlutter     Rate controlled and looks new for her 2. Known hx of some degree of PVCs, NSVT, and bradycardia     No V ectopy of any frequency is noted, no NSVT so far either  As discussed above, failed attempt for traditional transvenous pacing system, planned for micra with Dr. Minna Merritts  Recent TIA-like symtoms it seems, ? Perhaps associate with bradycardia though pt declined admission and  MICRA implant at that time. I suspect more likely associated with AFlutter that was unknown for her that we see now.  She has not had any bradycardia here   I do not think she need urgent pacing/pacing this admission  Her risk score is at least  CHA2DS2-VASc Score = 6 Pending her echo  I have discussed EKG/tele findings with Dr. Leonie Man and defer anticoagulation agent choice and timing to hime.  Dr. Quentin Ore will see the patient later today   Signed,  Baldwin Jamaica, PA-C    07/12/2020 11:21 AM       Risk Assessment/Risk Scores:  { For questions or updates, please contact [place] Please consult [email or web address] for contact info under    Signed, Baldwin Jamaica, PA-C  07/12/2020 10:50 AM

## 2020-07-13 DIAGNOSIS — E1165 Type 2 diabetes mellitus with hyperglycemia: Secondary | ICD-10-CM

## 2020-07-13 DIAGNOSIS — Z950 Presence of cardiac pacemaker: Secondary | ICD-10-CM

## 2020-07-13 DIAGNOSIS — I4892 Unspecified atrial flutter: Secondary | ICD-10-CM

## 2020-07-13 LAB — CBC
HCT: 38.1 % (ref 36.0–46.0)
Hemoglobin: 11.9 g/dL — ABNORMAL LOW (ref 12.0–15.0)
MCH: 30.4 pg (ref 26.0–34.0)
MCHC: 31.2 g/dL (ref 30.0–36.0)
MCV: 97.2 fL (ref 80.0–100.0)
Platelets: 164 10*3/uL (ref 150–400)
RBC: 3.92 MIL/uL (ref 3.87–5.11)
RDW: 15.6 % — ABNORMAL HIGH (ref 11.5–15.5)
WBC: 7.1 10*3/uL (ref 4.0–10.5)
nRBC: 0 % (ref 0.0–0.2)

## 2020-07-13 LAB — HEPARIN LEVEL (UNFRACTIONATED)
Heparin Unfractionated: 0.9 IU/mL — ABNORMAL HIGH (ref 0.30–0.70)
Heparin Unfractionated: 0.25 IU/mL — ABNORMAL LOW (ref 0.30–0.70)
Heparin Unfractionated: 0.24 IU/mL — ABNORMAL LOW (ref 0.30–0.70)

## 2020-07-13 LAB — BASIC METABOLIC PANEL
Anion gap: 15 (ref 5–15)
BUN: 46 mg/dL — ABNORMAL HIGH (ref 8–23)
CO2: 20 mmol/L — ABNORMAL LOW (ref 22–32)
Calcium: 8.8 mg/dL — ABNORMAL LOW (ref 8.9–10.3)
Chloride: 97 mmol/L — ABNORMAL LOW (ref 98–111)
Creatinine, Ser: 9.74 mg/dL — ABNORMAL HIGH (ref 0.44–1.00)
GFR, Estimated: 4 mL/min — ABNORMAL LOW (ref >60)
Glucose, Bld: 211 mg/dL — ABNORMAL HIGH (ref 70–99)
Potassium: 5.5 mmol/L — ABNORMAL HIGH (ref 3.5–5.1)
Sodium: 132 mmol/L — ABNORMAL LOW (ref 135–145)

## 2020-07-13 LAB — GLUCOSE, CAPILLARY
Glucose-Capillary: 399 mg/dL — ABNORMAL HIGH (ref 70–99)
Glucose-Capillary: 186 mg/dL — ABNORMAL HIGH (ref 70–99)
Glucose-Capillary: 254 mg/dL — ABNORMAL HIGH (ref 70–99)
Glucose-Capillary: 314 mg/dL — ABNORMAL HIGH (ref 70–99)
Glucose-Capillary: 302 mg/dL — ABNORMAL HIGH (ref 70–99)
Glucose-Capillary: 311 mg/dL — ABNORMAL HIGH (ref 70–99)

## 2020-07-13 NOTE — Progress Notes (Signed)
STROKE TEAM PROGRESS NOTE   INTERVAL HISTORY No acute events overnight   Heparin drip initiated yesterday for atrial flutter. Hemodialysis planned for today.  Blood glucose improved since diet initiated yesterday afternoon. No further hypoglycemic episodes. Neurologically and hemodynamically stable.   Patient remains severely aphasic and unable to participate with ROS.  No family at bedside.   Vitals:   07/13/20 0900 07/13/20 1000 07/13/20 1100 07/13/20 1200  BP: 129/72 (!) 145/67 (!) 145/64 139/64  Pulse: 70 65 66 80  Resp: '14 15 14 12  '$ Temp:      TempSrc:      SpO2: 98% 100% 100% 98%  Weight:      Height:       CBC:  Recent Labs  Lab 07/11/20 1350 07/11/20 1446 07/13/20 0740  WBC 6.4  --  7.1  NEUTROABS 3.3  --   --   HGB 12.6 13.9 11.9*  HCT 40.9 41.0 38.1  MCV 99.3  --  97.2  PLT 179  --  123456   Basic Metabolic Panel:  Recent Labs  Lab 07/11/20 2019 07/12/20 1141 07/13/20 0740  NA 130* 134* 132*  K 6.0* 5.2* 5.5*  CL 97* 98 97*  CO2 18* 23 20*  GLUCOSE 488* 231* 211*  BUN 27* 30* 46*  CREATININE 6.32* 8.13* 9.74*  CALCIUM 8.0* 8.4* 8.8*  MG  --  1.9  --   PHOS 3.7 4.4  --    Lipid Panel:  Recent Labs  Lab 07/12/20 0229  CHOL 156  TRIG 382*  HDL 31*  CHOLHDL 5.0  VLDL 76*  LDLCALC 49   HgbA1c:  Recent Labs  Lab 07/12/20 0229  HGBA1C 8.5*   Urine Drug Screen: No results for input(s): LABOPIA, COCAINSCRNUR, LABBENZ, AMPHETMU, THCU, LABBARB in the last 168 hours.  Alcohol Level No results for input(s): ETH in the last 168 hours.  IMAGING past 24 hours No results found. PHYSICAL EXAM Constitutional: Obese middle-aged female sitting up in bed in NAD.  Psych: Affect appropriate to situation Eyes: No scleral injection HENT: No OP obstrucion MSK: no joint deformities.  CV: Irregular rate and rhthym Respiratory: no extra work of breathing  GI: Soft.  No distension. There is no tenderness.  Skin: WDI  Neuro: Mental Status: Patient is  awake, alert, severely aphasic, mimics at times but does not follow verbal commands.   Cranial Nerves: II: Visual Fields notable for decreased response to visual threat from the right. Pupils are equal, round, and reactive to light. III,IV, VI: Left gaze preference V: unable to test VII: Facial weakness on the right  VIII: deferred X: gag reflex intact XI: deferred XII: unable to cooperate Motor: Antigravity Bilat UE, +right UE drift,  No movement in right leg to deep nailbed pressure. Moves left arm/leg well and has 5/5 power in the left arm with no drift. Sensory: Sensation is diminished to noxious stimulation throughout right side, lesser degree on the left Deep Tendon Reflexes: areflexic Plantars: deferred Cerebellar: Unable to test  ASSESSMENT/PLAN BRIGID CHASE is a 60 y.o. female with PMH significant for ESRD, HTN, OSA on CPAP, HLD, DM2 with peripheral neuropathy, remote seizure d/t hypoglycemia, morbid obesity, BLE claudication, tachybrady syndrome with pacemaker, NSVT, anemia, Vitamin B12 deficiency, MRSA PNA, chronic bronchitis, hypothyroidism, and  bilat ulnar neuropathy who was LKW at dialysis 2/17. She drove herself to dialysis and then back home again. Soon thereafter, she was noted to have right sided weakness with gaze preference and inability to communicate.  EMS was ca;;ed and she was brought in as a code stroke. BP in the 170s, glucose wnl.   Per Dr. Derenda Fennel Consultation: "Of note, she was seen on 07/09/20 in the ED at [facility] for another episode that occurred during dialysis. She became bradycardic and went "blind in the left eye." A CT head was suggested but she left without this test. She had bradycardia and dizziness at the time, perhaps precipitating the spell. Plans were initiated to have her pacemaker replaced. The note from that visit states, "Dr. Myles Rosenthal was consulted and stated that patient was very symptomatic when she got bradycardic this morning  a.m. wanted [place] to make sure to possibly get a CT of her head and admit her to the hospital and let Dr. Minna Merritts See her and decide if he wants to put in a pacemaker now. Dr. Minna Merritts agreed to this plan but the patient stated that she is not going to stay in the hospital, and she will call Dr. Talbot Grumbling office and make arrangements for him to redo her pacemaker. Patient states that she is tired, her symptoms have been completely back to normal, she did not make arrangements to stay here today, and she will just follow-up as an outpatient. She also refused to wait to do a CT of the head."  Acute ischemic infarcts within the left MCA and PCA territories with a small amount of petechial blood products in the left basal ganglia d/t L MCA M2 occlusion s/p successful thrombectomy etiology likely cardioembolic from atrial flutter/fibrillation   Code Stroke: Acute left occipital lobe infarction. Suspected acute infarction of the body of the left caudate. No large vessel occlusion or hemodynamically significant stenosis in the neck. Nonocclusive thrombus at the left MCA bifurcation. Occlusion of a proximal left M3 MCA branch. Patent proximal left PCA.  CTA head & neck:  Acute left occipital lobe infarction. Suspected acute infarction of the body of the left caudate. No large vessel occlusion or hemodynamically significant stenosis in the neck. Nonocclusive thrombus at the left MCA bifurcation. Occlusion of a proximal left M3 MCA branch. Patent proximal left PCA.   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 No midline shift or other mass effect.   2D Echo EF 55-60%, No shunt   LDL 49  HgbA1c 8.5  Heparin drip per stroke protocol initiated 2/18. Heparin level 0.90 today. Pharmacy management appreciated. Hgb 12.6->13.9->11.9  Passed swallow eval for dysphagia diet, RD following. Monitor po intake. RN  will notify if po intake is poor.   Therapy recommendations:  CIR  Disposition:  TBD pending, PM&R consult for CIR  Hypertension  Home meds:  Norvasc '10mg'$  daily, losartan '50mg'$  daily demadex '20mg'$  daily.    100-161/60-90  Maintain strict blood pressure control (nephrology following) . Long-term BP goal normotensive  Hyperlipidemia  Home meds:  80 mg Lipitor daily   LDL 49 at goal < 70  Continue statin at discharge  Diabetes type II Uncontrolled  Home meds:  Lantus 80 units at bedtime   HgbA1c 8.5, goal < 7.0  CBGs with labile blood glucose with hypoglycemic episodes x 2 treated with D50 on 2/18 now uptrending Recent Labs    07/13/20 0304 07/13/20 0727 07/13/20 1159  GLUCAP 399* 186* 254*      SSI coverage changed to very sensitive scale   Has been cleared for diet, RN will report if  patient is not taking po well.  Defer to nephrology management.   Other Stroke Risk Factors  Former Cigarette smoker x 27 years, quit 2019  Obesity, Body mass index is 39.95 kg/m., BMI >/= 30 associated with increased stroke risk, recommend weight loss, diet and exercise as appropriate   Family hx stroke (Sister)  Obstructive sleep apnea, on CPAP at home  Other Active Problems: ESRD  Nephrology consulted and following. Appreciate assistance.  Hyperkalemia: meg mgmt  Nephrology advises HD planned for today  Daily renal panel   Daily weights, Daily Renal Panel, Strict I/Os, Avoid nephrotoxins (NSAIDs, judicious IV Contrast)  Atrial flutter, new: Known hx of some degree of PVCs, NSVT, and bradycardia with pacemaker and recent history of aborted replacement attempt. Also declined admission and  MICRA implant  [facility]  EP consult called. Appreciate recommendations.   Heparin drip initiated 2/18 evening.   No bradycardia episodes seen  Continue cardiac monitoring   Shatara Stanek A Bailey-Modzik, NP-C Neurology Stroke Service  To contact Stroke Continuity provider,  please refer to [email or web address] After hours, contact General Neurology

## 2020-07-13 NOTE — Progress Notes (Signed)
Patient received to unit with Amy,RN.  Tele box in place. No s/s of distress noted.  Drowsy at this time RN stated therapy has worked her hard today.  Patient belongings placed at bedside including a tablet.  CCMD  Called and patient was placed on floor tele box verified by Patsy,CNA.  Bed alarm on call light within reach will continue to monitor.

## 2020-07-13 NOTE — Progress Notes (Signed)
  Speech Language Pathology Treatment: Dysphagia  Patient Details Name: Rachael Herrera MRN: [id number] DOB: 05-07-1961 Today's Date: 07/13/2020 Time: [id number] SLP Time Calculation (min) (ACUTE ONLY): 16 min  Assessment / Plan / Recommendation Clinical Impression  SLP on unit and stopped by RN reporting pt's daughter giving her cheese puffs and Sprite Zero from the bottle. On arrival pt awake but sleepy and obviously fatigued following PT session and end of day. She was throat clearing and when oral cavity inspected no significant residue noted. Consumed cup sip and trial of straw sip Sprite and pt cleared throat continuously unable to expectorate audible mucous. With cues, volitionally produced throat clear but could not cough and use of [place] elicited gag but mildly wet vocal quality persisted. Suspect she may have penetrated/aspirated some of the liquid or if was consumed with mixed texture (puffs and Sprite?). Educated daughter that recommendation is to continue with fine chopped texture (D2) and no other solids at this time as her endurance fluctuates and decreased cough (daughter in agreement).     HPI HPI: 60 yr old experienced difficulty speaking and moving the right side. MRI revelaed acute ischemic infarcts within the left MCA and PCA territories with a small amount of petechial blood products in the left basal  ganglia. Underwent emergent thrombectomy of left MCA M2 occlusion and revascularization. Intubated 2/17 during procedure.      SLP Plan  Continue with current plan of care       Recommendations  Diet recommendations: Dysphagia 2 (fine chop);Thin liquid Liquids provided via: Cup;No straw Medication Administration: Whole meds with puree Supervision: Staff to assist with self feeding;Full supervision/cueing for compensatory strategies Compensations: Minimize environmental distractions;Slow rate;Small sips/bites;Lingual sweep for clearance of pocketing Postural Changes and/or  Swallow Maneuvers: Seated upright 90 degrees                General recommendations: Rehab consult Oral Care Recommendations: Oral care BID Follow up Recommendations: Inpatient Rehab SLP Visit Diagnosis: Dysphagia, unspecified (R13.10) Plan: Continue with current plan of care             [name] 07/13/2020, 5:28 PM  [name].Ed Risk analyst [phone] Office [phone]

## 2020-07-13 NOTE — Progress Notes (Signed)
Nephrology Follow-Up Consult note   Assessment/Recommendations: Rachael Herrera is a/an 60 y.o. female with a past medical history significant for ESRD, admitted for CVA after dialysis.     Outpt Records: NEPHROLOGIST: Cay Schillings MD  LOCATION: [facility]  SCHEDULE: T-Th-S 1st Shift  EDW: 273 kg.  KIDNEY: [facility] F200N  LITERS PROC: 73.5 liters/treatment  HD TIME: 210  ACCESS: R IJ PC  NEEDLE SIZE:  ANTICOAG: Standard Heparin Per Protocol  BATH: 3.0CA-HCO3  QB: 350 ml/min  QD: 700 ml/min    ESRD: Continue dialysis based on current prescription.  Will use 2K given hyperkalemia.  Plan for dialysis today per schedule Hyperkalemia: Improved with Lokelma. Dialysis today Hypertension/volume: Volume status and BP acceptable. Get to EDW if tolerated Anemia: hgb acceptable at 13.9 Secondary hyperparathyroidism:home sevelamer when taking p.o.  Would continue Sensipar 60 mg daily DM2: mgmt per primary CVA: s/p thrombectomy. Mgmt per primary   Recommendations conveyed to primary service.    [facility] 07/13/2020 11:15 AM  ___________________________________________________________  CC: ESRD  Interval History/Subjective: Patient continues to be unable to converse regularly. Unclear how much she understands. No significant changes over the past 24 h.  Medications:  Current Facility-Administered Medications  Medication Dose Route Frequency Provider Last Rate Last Admin  . acetaminophen (TYLENOL) tablet 650 mg  650 mg Oral Q4H PRN Absher, Minette Brine, MD       Or  . acetaminophen (TYLENOL) 160 MG/5ML solution 650 mg  650 mg Per Tube Q4H PRN Absher, Minette Brine, MD       Or  . acetaminophen (TYLENOL) suppository 650 mg  650 mg Rectal Q4H PRN Absher, Minette Brine, MD   650 mg at 07/11/20 1723  . amitriptyline (ELAVIL) tablet 25 mg  25 mg Oral QHS Absher, Minette Brine, MD   25 mg at 07/12/20 2050  . atorvastatin (LIPITOR) tablet 80 mg  80 mg Oral Daily Absher, Minette Brine, MD   80 mg at 07/13/20 0944  . Chlorhexidine Gluconate Cloth 2 % PADS 6 each  6 each Topical Daily Absher, Minette Brine, MD   6 each at 07/13/20 1045  . cinacalcet (SENSIPAR) tablet 60 mg  60 mg Oral Q breakfast Reesa Chew, MD   60 mg at 07/13/20 0809  . clevidipine (CLEVIPREX) infusion 0.5 mg/mL  0-21 mg/hr Intravenous Continuous de Rosario Jacks, MD   Stopped at 07/11/20 2020  . feeding supplement (NEPRO CARB STEADY) liquid 237 mL  237 mL Oral TID BM Garvin Fila, MD   237 mL at 07/13/20 0947  . heparin ADULT infusion 100 units/mL (25000 units/266m)  1,150 Units/hr Intravenous Continuous DAlvira Philips RPH 11.5 mL/hr at 07/13/20 1048 1,150 Units/hr at 07/13/20 1048  . insulin aspart (novoLOG) injection 0-6 Units  0-6 Units Subcutaneous Q4H Bailey-Modzik, Delila A, NP   1 Units at 07/13/20 0806  . MEDLINE mouth rinse  15 mL Mouth Rinse BID SGarvin Fila MD   15 mL at 07/13/20 0947  . multivitamin (RENA-VIT) tablet 1 tablet  1 tablet Oral QHS SGarvin Fila MD   1 tablet at 07/12/20 2226  . senna-docusate (Senokot-S) tablet 1 tablet  1 tablet Oral QHS PRN Absher, JMinette Brine MD      . sodium chloride flush (NS) 0.9 % injection 3 mL  3 mL Intravenous Once PDavonna Belling MD          Review of Systems: Unable to answer questions due to AMS  Physical Exam: Vitals:   07/13/20 0900 07/13/20 1000  BP: 129/72 (!) 145/67  Pulse: 70 65  Resp: 14 15  Temp:    SpO2: 98% 100%   Total I/O In: 281.9 [P.O.:240; I.V.:41.9] Out: -   Intake/Output Summary (Last 24 hours) at 07/13/2020 1115 Last data filed at 07/13/2020 1000 Gross per 24 hour  Intake 1078.76 ml  Output --  Net 1078.76 ml   Constitutional: Obese, lying in bed, no distress ENMT: ears and nose without scars or lesions, MMM CV: normal rate, trace edema in the lower extremity Respiratory: Bilateral chest rise, normal work of breathing Gastrointestinal: soft, non-tender, no palpable masses or  hernias Skin: no visible lesions or rashes Psych: Awake but unable to answer questions, confused   Test Results I personally reviewed new and old clinical labs and radiology tests Lab Results  Component Value Date   NA 132 (L) 07/13/2020   K 5.5 (H) 07/13/2020   CL 97 (L) 07/13/2020   CO2 20 (L) 07/13/2020   BUN 46 (H) 07/13/2020   CREATININE 9.74 (H) 07/13/2020   CALCIUM 8.8 (L) 07/13/2020   ALBUMIN 3.6 07/12/2020   PHOS 4.4 07/12/2020

## 2020-07-13 NOTE — Progress Notes (Signed)
ANTICOAGULATION CONSULT NOTE  Pharmacy Consult for heparin Indication: atrial fibrillation  No Known Allergies  Patient Measurements: Height: '5\' 7"'$  (170.2 cm) Weight: 115.7 kg (255 lb 1.2 oz) IBW/kg (Calculated) : 61.6 Heparin Dosing Weight: 88kg  Vital Signs: Temp: 98.7 F (37.1 C) (02/19 1802) Temp Source: Oral (02/19 1802) BP: 151/85 (02/19 1802) Pulse Rate: 73 (02/19 1802)  Labs: Recent Labs    07/11/20 1350 07/11/20 1446 07/11/20 2019 07/12/20 1141 07/12/20 2247 07/13/20 0740 07/13/20 0940 07/13/20 1851  HGB 12.6 13.9  --   --   --  11.9*  --   --   HCT 40.9 41.0  --   --   --  38.1  --   --   PLT 179  --   --   --   --  164  --   --   APTT 28  --   --   --   --   --   --   --   LABPROT 12.7  --   --   --   --   --   --   --   INR 1.0  --   --   --   --   --   --   --   HEPARINUNFRC  --   --   --   --    < > 0.90* 0.25* 0.24*  CREATININE 6.16* 6.10* 6.32* 8.13*  --  9.74*  --   --    < > = values in this interval not displayed.    Estimated Creatinine Clearance: 8 mL/min (A) (by C-G formula based on SCr of 9.74 mg/dL (H)).   Assessment: 25 YOF presenting as code stroke, MCA infarct, no tPA and TICI3 in IR.  Now with new onset aflutter found, CHA2DS2VASc 6.  Not on anticoagulation PTA.  Heparin level this evening remains subtherapeutic at 0.24 on heparin infusion at 1150 units/hr. RN reports no overt s/s of bleeding   Goal of Therapy:  Heparin level 0.3-0.5 units/ml Monitor platelets by anticoagulation protocol: Yes   Plan:  -Increase heparin infusion to 1250 units/hr -F/u 8 hr HL  -Monitor closely for s/s of bleeding   Thank you for involving pharmacy in this patient's care.  Albertina Parr, PharmD., BCPS, BCCCP Clinical Pharmacist Please refer to [facility] for unit-specific pharmacist

## 2020-07-13 NOTE — Progress Notes (Addendum)
ANTICOAGULATION CONSULT NOTE  Pharmacy Consult for heparin Indication: atrial fibrillation  No Known Allergies  Patient Measurements: Height: '5\' 7"'$  (170.2 cm) Weight: 115.7 kg (255 lb 1.2 oz) IBW/kg (Calculated) : 61.6 Heparin Dosing Weight: 88kg  Vital Signs: Temp: 99.7 F (37.6 C) (02/19 0400) Temp Source: Oral (02/19 0400) BP: 135/75 (02/19 0800) Pulse Rate: 71 (02/19 0800)  Labs: Recent Labs    07/11/20 1350 07/11/20 1446 07/11/20 2019 07/12/20 1141 07/12/20 2247 07/13/20 0740  HGB 12.6 13.9  --   --   --  11.9*  HCT 40.9 41.0  --   --   --  38.1  PLT 179  --   --   --   --  164  APTT 28  --   --   --   --   --   LABPROT 12.7  --   --   --   --   --   INR 1.0  --   --   --   --   --   HEPARINUNFRC  --   --   --   --  0.52 0.90*  CREATININE 6.16* 6.10* 6.32* 8.13*  --  9.74*    Estimated Creatinine Clearance: 8 mL/min (A) (by C-G formula based on SCr of 9.74 mg/dL (H)).   Assessment: 42 YOF presenting as code stroke, MCA infarct, no tPA and TICI3 in IR.  Now with new onset aflutter found, CHA2DS2VASc 6.  Not on anticoagulation PTA.  Heparin level appears supratherapeutic at 0.9 despite rate decrease to 1050 units/hr. Spoke with RN who states IV heparin infusing in right hand and phlebotomy drew from right finger - suspect this level is inaccurate. Left arm unavailable for lab draws. No bleeding reported per RN, Hgb down 11.9, platelets are normal.   Goal of Therapy:  Heparin level 0.3-0.5 units/ml Monitor platelets by anticoagulation protocol: Yes   Plan:  RN will move heparin drip to right forearm with plan to hold heparin drip for 10 min and flush prior to phlebotomy draw  Redraw STAT heparin level Continue heparin drip at 1050 units/hr Monitor for s/sx of bleeding  Thank you for involving pharmacy in this patient's care.  Renold Genta, PharmD, BCPS Clinical Pharmacist Clinical phone for 07/13/2020 until 3p is [phone] 07/13/2020 9:27  AM  **Pharmacist phone directory can be found on [email or web address] listed under [place]**  Addendum: Repeat STAT heparin level subtherapeutic at 0.25. Heparin level drawn from hand and infusing in forearm per RN. Suspect this level is more accurate. No bleeding noted.    Increase heparin drip to 1150 units/hr 8h heparin level  Renold Genta, PharmD, BCPS 10:33 AM

## 2020-07-13 NOTE — Progress Notes (Signed)
Inpatient Rehab Admissions Coordinator Note:   Per therapy recommendations, pt was screened for CIR candidacy by Sae Handrich, MS CCC-SLP. At this time, Pt. Appears to have functional decline and is a good candidate for CIR. Will request order for rehab consult per protocol.  Please contact me with questions.   Burnice Oestreicher, MS, CCC-SLP Rehab Admissions Coordinator  [phone] (celll) [phone] (office)  

## 2020-07-13 NOTE — Progress Notes (Signed)
STROKE TEAM PROGRESS NOTE   INTERVAL HISTORY Hypoglycemic episodes x2 yesterday, Improving blood glucose overnight once cleared for diet.  Hemodialysis is planned for today.   Patient is aphasic and not following commands well. Unable to obtain ROS. Plan of care explained. Unable to assess patient's comprehension Abnormal and irregular heart rhythm.  EP consulted feels it is atrial flutter.  No family at bedside.   Vitals:   07/13/20 0300 07/13/20 0400 07/13/20 0500 07/13/20 0600  BP: 137/71 138/73 125/67 139/67  Pulse: 78 76 73 80  Resp: _0 Temp:  99.7 F (37.6 C)    TempSrc:  Oral    SpO2: 99% 96% 99% 99%  Weight:      Height:       CBC:  Recent Labs  Lab 07/11/20 1350 07/11/20 1446  WBC 6.4  --   NEUTROABS 3.3  --   HGB 12.6 13.9  HCT 40.9 41.0  MCV 99.3  --   PLT 179  --    Basic Metabolic Panel:  Recent Labs  Lab 07/11/20 2019 07/12/20 1141  NA 130* 134*  K 6.0* 5.2*  CL 97* 98  CO2 18* 23  GLUCOSE 488* 231*  BUN 27* 30*  CREATININE 6.32* 8.13*  CALCIUM 8.0* 8.4*  MG  --  1.9  PHOS 3.7 4.4   Lipid Panel:  Recent Labs  Lab 07/12/20 0229  CHOL 156  TRIG 382*  HDL 31*  CHOLHDL 5.0  VLDL 76*  LDLCALC 49   HgbA1c:  Recent Labs  Lab 07/12/20 0229  HGBA1C 8.5*   Urine Drug Screen: No results for input(s): LABOPIA, COCAINSCRNUR, LABBENZ, AMPHETMU, THCU, LABBARB in the last 168 hours.  Alcohol Level No results for input(s): ETH in the last 168 hours.  IMAGING past 24 hours IR US Guide Vasc Access Left  Result Date: 07/12/2020 INDICATION: 60 year old female with past medical history significant for Type 2 diabetes mellitus, chronic kidney disease stage 4, hypertension, obesity and OSA. Presenting with right-sided weakness and aphasia. Last known well at 8 a.m. on 07/11/2020. NIHSS 24; modified Rankin scale 1. Head CT showed acute/subacute infarct in the left occipital lobe, nonocclusive distal left M1 thrombosis and occlusion of a distal  left M2/MCA posterior division branch. She was taken to our service for a diagnostic cerebral angiogram and mechanical thrombectomy. EXAM: Ultrasound-guided vascular access Diagnostic cerebral angiogram Mechanical thrombectomy Flat panel head CT COMPARISON:  CT/CT angiogram of the head and neck July 11, 2020. MEDICATIONS: Refer to anesthesia documentation. ANESTHESIA/SEDATION: The procedure was performed in the general anesthesia. CONTRAST:  45 mL of Omnipaque 240 milligram/mL FLUOROSCOPY TIME:  Fluoroscopy Time: 9 minutes 30 seconds (678 mGy). COMPLICATIONS: None immediate. TECHNIQUE: Informed written consent was obtained from the patient's daughter after a thorough discussion of the procedural risks, benefits and alternatives. All questions were addressed. Maximal Sterile Barrier Technique was utilized including caps, mask, sterile gowns, sterile gloves, sterile drape, hand hygiene and skin antiseptic. A timeout was performed prior to the initiation of the procedure. The right groin was prepped and draped in the usual sterile fashion. Using a micropuncture kit and the modified Seldinger technique, access was gained to the left common femoral artery and an 8 French sheath was placed. Real-time ultrasound guidance was utilized for vascular access including the acquisition of a permanent ultrasound image documenting patency of the accessed vessel. Under fluoroscopy, a Zoom 88 guide catheter was navigated over a 6 [place] Berenstein 2 catheter and a 0.035" Terumo [employer]  into the aortic arch. The catheter was placed into the left common carotid artery and then advanced into the left internal carotid artery. The inner catheter was removed. Frontal and lateral angiograms of the head were obtained. FINDINGS: 1. Heavily calcified plaques in the right common femoral artery with high-grade stenosis. 2. Heavily calcified plaques in the left common femoral artery without significant stenosis. 3. Occlusion of a distal  left M2/MCA posterior division branch. PROCEDURE: Under biplane roadmap, a zoom 71 aspiration catheter was navigated over a phenom 21 microcatheter and a synchro support microguidewire into the cavernous segment of the left ICA. The microcatheter was then navigated over the wire into the left M2/MCA posterior division branch. Then, a 4 x 40 mm solitaire stent retriever was deployed spanning the M2 segment. The device was allowed to intercalated with the clot for 4 minutes. The microcatheter was removed. The aspiration catheter was advanced to the level of occlusion and connected to a penumbra asp a iration pump. The guiding catheter was advanced to the level of the supraclinoid left ICA. The thrombectomy device and aspiration catheter were removed under constant aspiration. Follow-up left ICA angiograms with frontal lateral views of the head showed complete recanalization of the left MCA territory (TICI3). Flat panel CT of the head was obtained and post processed in a separate workstation with concurrent attending physician supervision. Selected images were sent to PACS. Minimal left to in hyperdensity may represent minimal contrast staining. The guide catheter was withdrawn. A 6 [place] Berenstein 2 catheter was navigated over a 0.035 inch Terumo Glidewire into the aortic arch. The catheter was then placed in the left subclavian artery and subsequently in the left vertebral artery. Townes and lateral angiograms of the head were obtained. The left vertebral artery, basilar and bilateral posterior cerebral arteries are patent. The intracranial right vertebral artery was seen by contrast reflux in appear normal. The catheter was subsequently withdrawn. Left common femoral artery angiograms were obtained and lateral left anterior oblique view. The puncture is at the level of the left femoral artery. The femoral sheath was exchanged for an 8 [place] Angio-Seal which was utilized for access closure. Immediate hemostasis  was achieved IMPRESSION: Successful and uncomplicated mechanical thrombectomy for treatment of a left M2/MCA occlusion. One pass performed with complete recanalization (TICI 3) PLAN: Patient extubated and transferred to ICU for continued care. Electronically Signed   By: Pedro Earls M.D.   On: 07/12/2020 12:15   ECHOCARDIOGRAM COMPLETE  Result Date: 07/12/2020    ECHOCARDIOGRAM REPORT   Patient Name:   Rachael Herrera Date of Exam: 07/12/2020 Medical Rec #:  [id number]     Height: Accession #:    [id number]    Weight:       255.1 lb Date of Birth:  [birth year]     BSA:          2.375 m Patient Age:    89 years      BP:           131/73 mmHg Patient Gender: F             HR:           71 bpm. Exam Location:  Inpatient Procedure: 2D Echo, Cardiac Doppler, Color Doppler and Intracardiac            Opacification Agent Indications:    Stroke  History:        Patient has no prior history of Echocardiogram examinations.  Stroke; Risk Factors:Hypertension, Dyslipidemia and Sleep Apnea.  Sonographer:    Roseanna Rainbow RDCS Referring Phys: [id number] Minette Brine [facility]  Sonographer Comments: Technically difficult study due to poor echo windows and patient is morbidly obese. Image acquisition challenging due to patient body habitus. IMPRESSIONS  1. Left ventricular ejection fraction, by estimation, is 55 to 60%. The left ventricle has normal function. The left ventricle has no regional wall motion abnormalities. There is moderate left ventricular hypertrophy. Left ventricular diastolic parameters are indeterminate.  2. Right ventricular systolic function is normal. The right ventricular size is normal.  3. The mitral valve is normal in structure. No evidence of mitral valve regurgitation. No evidence of mitral stenosis.  4. The aortic valve is tricuspid. Aortic valve regurgitation is trivial. Mild aortic valve sclerosis is present, with no evidence of aortic valve stenosis.  5. The inferior vena cava is  dilated in size with >50% respiratory variability, suggesting right atrial pressure of 8 mmHg. FINDINGS  Left Ventricle: Left ventricular ejection fraction, by estimation, is 55 to 60%. The left ventricle has normal function. The left ventricle has no regional wall motion abnormalities. Definity contrast agent was given IV to delineate the left ventricular  endocardial borders. The left ventricular internal cavity size was normal in size. There is moderate left ventricular hypertrophy. Left ventricular diastolic parameters are indeterminate. Right Ventricle: The right ventricular size is normal. No increase in right ventricular wall thickness. Right ventricular systolic function is normal. Left Atrium: Left atrial size was normal in size. Right Atrium: Right atrial size was normal in size. Pericardium: There is no evidence of pericardial effusion. Mitral Valve: The mitral valve is normal in structure. No evidence of mitral valve regurgitation. No evidence of mitral valve stenosis. Tricuspid Valve: The tricuspid valve is normal in structure. Tricuspid valve regurgitation is not demonstrated. No evidence of tricuspid stenosis. Aortic Valve: The aortic valve is tricuspid. Aortic valve regurgitation is trivial. Mild aortic valve sclerosis is present, with no evidence of aortic valve stenosis. Pulmonic Valve: The pulmonic valve was normal in structure. Pulmonic valve regurgitation is trivial. No evidence of pulmonic stenosis. Aorta: The aortic root is normal in size and structure. Venous: The inferior vena cava is dilated in size with greater than 50% respiratory variability, suggesting right atrial pressure of 8 mmHg. IAS/Shunts: No atrial level shunt detected by color flow Doppler.  LEFT VENTRICLE PLAX 2D LVIDd:         4.40 cm LVIDs:         3.30 cm LV PW:         1.40 cm LV IVS:        1.40 cm LVOT diam:     2.30 cm LV SV:         83 LV SV Index:   35 LVOT Area:     4.15 cm  LV Volumes (MOD) LV vol d, MOD A2C: 57.1  ml LV vol d, MOD A4C: 84.3 ml LV vol s, MOD A2C: 20.0 ml LV vol s, MOD A4C: 39.0 ml LV SV MOD A2C:     37.1 ml LV SV MOD A4C:     84.3 ml LV SV MOD BP:      41.7 ml RIGHT VENTRICLE             IVC RV S prime:     15.20 cm/s  IVC diam: 2.10 cm TAPSE (M-mode): 2.6 cm LEFT ATRIUM             Index  RIGHT ATRIUM           Index LA diam:        4.00 cm 1.68 cm/m  RA Area:     14.50 cm LA Vol (A2C):   32.1 ml 13.52 ml/m RA Volume:   34.00 ml  14.32 ml/m LA Vol (A4C):   44.1 ml 18.57 ml/m LA Biplane Vol: 40.5 ml 17.05 ml/m  AORTIC VALVE LVOT Vmax:   104.00 cm/s LVOT Vmean:  79.200 cm/s LVOT VTI:    0.200 m  AORTA Ao Root diam: 3.30 cm Ao Asc diam:  3.60 cm MITRAL VALVE MV Area (PHT): 4.34 cm     SHUNTS MV Decel Time: 175 msec     Systemic VTI:  0.20 m MV E velocity: 121.00 cm/s  Systemic Diam: 2.30 cm Candee Furbish MD Electronically signed by Candee Furbish MD Signature Date/Time: 07/12/2020/11:51:00 AM    Final    PHYSICAL EXAM Constitutional: Obese middle-aged lady not in distress., eyes open, unable to vocalize  Psych: Affect appropriate to situation Eyes: No scleral injection HENT: No OP obstrucion MSK: no joint deformities.  Cardiovascular: Normal rate and regular rhythm.  Respiratory: Effort normal, non-labored breathing, apneic at times. GI: Soft.  No distension. There is no tenderness.  Skin: WDI  Neuro: Mental Status: Patient is awake, alert, severely aphasic, mimics but does not follow verbal commands.  Mute barely speaks occasional words. Cranial Nerves: II: Visual Fields notable for decreased response to visual threat from the right. Pupils are equal, round, and reactive to light. III,IV, VI: Left gaze preference V: unable to test VII: Facial weakness on the right  VIII: deferred X: gag reflex intact XI: deferred XII: unable to cooperate Motor: Antigravity Bilat UE, +right UE drift,  No movement in right leg to deep nailbed pressure. Moves left arm/leg well and has 5/5 power in  the left arm with no drift. Sensory: Sensation is diminished to noxious stimulation throughout right side, lesser degree on the left Deep Tendon Reflexes: areflexic Plantars: deferred Cerebellar: Unable to test  ASSESSMENT/PLAN Rachael Herrera is a 60 y.o. female with PMH significant for ESRD, HTN, OSA on CPAP, HLD, DM2 with peripheral neuropathy, remote seizure d/t hypoglycemia, morbid obesity, BLE claudication, tachybrady syndrome with pacemaker, NSVT, anemia, Vitamin B12 deficiency, MRSA PNA, chronic bronchitis, hypothyroidism, and  bilat ulnar neuropathy who was LKW at dialysis 2/17. She drove herself to dialysis and then back home again. Soon thereafter, she was noted to have right sided weakness with gaze preference and inability to communicate. EMS was ca;;ed and she was brought in as a code stroke. BP in the 170s, glucose wnl.   Per Dr. Derenda Fennel Consultation: "Of note, she was seen on 07/09/20 in the ED at [facility] for another episode that occurred during dialysis. She became bradycardic and went "blind in the left eye." A CT head was suggested but she left without this test. She had bradycardia and dizziness at the time, perhaps precipitating the spell. Plans were initiated to have her pacemaker replaced. The note from that visit states, "Dr. Myles Rosenthal was consulted and stated that patient was very symptomatic when she got bradycardic this morning a.m. wanted [place] to make sure to possibly get a CT of her head and admit her to the hospital and let Dr. Minna Merritts See her and decide if he wants to put in a pacemaker now. Dr. Minna Merritts agreed to this plan but the patient stated that she is not going to stay in the  hospital, and she will call Dr. Talbot Grumbling office and make arrangements for him to redo her pacemaker. Patient states that she is tired, her symptoms have been completely back to normal, she did not make arrangements to stay here today, and she will just follow-up as an outpatient. 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 Suspected acute infarction of the body of the left caudate. No large vessel occlusion or hemodynamically significant stenosis in the neck. Nonocclusive thrombus at the left MCA bifurcation. Occlusion of a proximal left M3 MCA branch. Patent proximal left PCA.  CTA head & neck:  Acute left occipital lobe infarction. Suspected acute infarction of the body of the left caudate. No large vessel occlusion or hemodynamically significant stenosis in the neck. Nonocclusive thrombus at the left MCA bifurcation. Occlusion of a proximal left M3 MCA branch. Patent proximal left PCA.   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 No midline shift or other mass effect.   2D Echo EF 55-60%, No shunt   LDL 49  HgbA1c 8.5  VTE prophylaxis -heparin 5000 sq q 8 hrs  Passed swallow eval for dysphagia diet, monitor po intake. RN will notify if po intake is poor.   Therapy recommendations:  TBD  Disposition:  TBD  Hypertension  Home meds:  Norvasc 71m daily, losartan 552mdaily demadex 2061maily   106-154/49-82, Cleviprex gtt stopped last pm  . Permissive hypertension (OK if < 220/120) but gradually normalize in 5-7 days . Long-term BP goal normotensive  Hyperlipidemia  Home meds:  80 mg Lipitor daily   LDL 49 at goal < 70  Continue statin at discharge  Diabetes type II Uncontrolled  Home meds:  Lantus 80 units at bedtime   HgbA1c 8.5, goal < 7.0  CBGs show labile blood glucose with hypoglycemic episodes x 2 treated with D50 Recent  Labs    07/12/20 2310 07/13/20 0304 07/13/20 0727  GLUCAP 403* 399* 186*      SSI coverage changed to very sensitive scale   Has been cleared for diet, RN will report if patient is not taking po well. Discussed with Dr. SanJoelyn Omsefer to nephrology management.   Other Stroke Risk Factors  Former Cigarette smoker x 27 years, quit 2019  Obesity, Body mass index is 39.95 kg/m., BMI >/= 30 associated with increased stroke risk, recommend weight loss, diet and exercise as appropriate   Family hx stroke (Sister)  Obstructive sleep apnea, on CPAP at home  Other Active Problems: ESRD  Nephrology consulted and following. Appreciate assistance.  Hyperkalemia: meg mgmt  Nephrology advises to  avoid HD in first 24h post CVA if possible  Daily renal panel   Daily weights, Daily Renal Panel, Strict I/Os, Avoid nephrotoxins (NSAIDs, judicious IV Contrast)  New Atrial Flutter: Known hx of some degree of PVCs, NSVT, and bradycardia with pacemaker and recent history of aborted replacement attempt. Also declined admission and  MICRA implant  [facility]. Appreciate recommendations.   New atrial flutter identified. AC recommended. Heparin gtt initiated evening 2/18. Appreciate pharmacist management.  No bradycardia episodes thus far  Continue cardiac monitoring   Hospital day # 2  ATTENDING NOTE: I reviewed above note and agree with the assessment and plan. Pt was seen and examined.   60 year old female with history of ESRD on hemodialysis, hypertension, OSA on CPAP, HLD, diabetes, seizure, morbid obesity, tachycardia with pacemaker admitted for right-sided weakness, left gaze and aphasia.  CT showed left occipital and left caudate infarct.  CTA head and neck left M1/M2 thrombus, left M3 occlusion.  IR performed with left M2 TICI3 reperfusion.  MRI showed left MCA and PCA infarct with BG petechial hemorrhage.  EF 55 to 60%.  LDL 49 and A1c 8.5.  Pacemaker  interrogation showed a flutter which has been following with [facility].  Sodium 134, creatinine 8.13.  General - morbid obesity, well developed, drowsy sleepy.  Ophthalmologic - fundi not visualized due to noncooperation.  Cardiovascular - irregularly irregular heart rate and rhythm  Neuro - drowsy sleepy, needed repetitive stimulation to keep eye open. Global aphasia, non verbal and not following commands, able to pantomime some action but limited. Left gaze preference but able to have right gaze, blinking to visual threat on the left but not on the right. Right facial droop. Tongue protrusion not cooperative. Spontaneous moving left UE and able against gravity without drift. RUE 3/5 with drift. BLE proximal 3-/5 not cooperative with distal strength testing. Sensation, coordination not cooperative and gait not tested.  Currently patient on heparin IV for secondary stroke prevention due to a flutter.  Also on statin with Lipitor 80.  We will continue heparin IV for 5 to 7 days, if tolerating well, will switch to NOAC.  PT/OT recommend CR.  [name], MD PhD Stroke Neurology 07/13/2020 10:20 PM  This patient is critically ill due to left MCA stroke, status post thrombectomy, a flutter on anticoagulation, uncontrolled diabetes and at significant risk of neurological worsening, death form recurrent stroke, hemorrhagic conversion, heart failure, DKA, seizure. This patient's care requires constant monitoring of vital signs, hemodynamics, respiratory and cardiac monitoring, review of multiple databases, neurological assessment, discussion with family, other specialists and medical decision making of high complexity. I spent 35 minutes of neurocritical care time in the care of this patient.   To contact Stroke Continuity provider, please refer to [email or web address] After hours, contact General Neurology

## 2020-07-13 NOTE — Progress Notes (Signed)
Physical Therapy Treatment Patient Details Name: Rachael Herrera MRN: [id number] DOB: 1960/06/04 Today's Date: 07/13/2020    History of Present Illness The pt is a 60 yo female presenting with right-sided weakenss and gaze preference. 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 Pt s/p thrombectomy 2/17.  PMH includes: DM II, OSA, CKD IV, HTN, HLD, and morbid obesity.    PT Comments    The pt was able to make good progress with mobility and transfers during this session, completing multiple sit-stand transfers with progression from [place] of 2 to minA of 1 to power up. The pt does continue to require significant increased time for processing and repeated cues for sequencing, technique, hand position, and posture, but demos good improvements within session and compared to prior eval. Pt remains limited in expression by expressive and receptive aphasia, unable to consistently use yes/no nodding or hand movements to answer questions at this time. The pt will continue to benefit from skilled PT to progress functional transfers, strength, coordination, motor planning, stability, and gait to maximize return of function and independence with mobility.     Follow Up Recommendations  CIR     Equipment Recommendations   (defer to post acute)    Recommendations for Other Services       Precautions / Restrictions Precautions Precautions: Fall Restrictions Weight Bearing Restrictions: No    Mobility  Bed Mobility Overal bed mobility: Needs Assistance Bed Mobility: Supine to Sit;Sit to Supine     Supine to sit: Mod assist Sit to supine: HOB elevated;Mod assist   General bed mobility comments: modA to complete movement of BLE and elevate trunk from [place]. pt with good use of LUE to pull up. able to initiate return to supine, but benefits from minA to BLE to clear over edge of bed    Transfers Overall transfer level: Needs assistance Equipment used:  Rolling walker (2 wheeled) Transfers: Sit to/from Stand Sit to Stand: Mod assist;From elevated surface         General transfer comment: modA with cues for hand positioning, use of momentum, and use of RW to power up from EOB to standing. completed x10 through session with assist to raise RUE to RW grip, then minA at times to assist with pt maintaining grip  Ambulation/Gait Ambulation/Gait assistance: Mod assist;+2 physical assistance Gait Distance (Feet): 3 Feet (x2) Assistive device: Rolling walker (2 wheeled) Gait Pattern/deviations: Step-to pattern;Decreased stride length Gait velocity: decreased   General Gait Details: pt with small lateral steps along EOB. benefits from facilitation of wt shift at hips to allow for clearance of LLE for each step.    Modified Rankin (Stroke Patients Only) Modified Rankin (Stroke Patients Only) Pre-Morbid Rankin Score: No significant disability Modified Rankin: Moderately severe disability     Balance Overall balance assessment: Needs assistance Sitting-balance support: Single extremity supported;Feet supported Sitting balance-Leahy Scale: Fair     Standing balance support: Bilateral upper extremity supported;During functional activity Standing balance-Leahy Scale: Poor Standing balance comment: reliant on support of therapist, some posterior lean with initial stands, requiring minA to steady                            Cognition Arousal/Alertness: Lethargic;Awake/alert (initially sleeping, but increased alertness easily) Behavior During Therapy: Impulsive Overall Cognitive Status: Impaired/Different from baseline Area of Impairment: Following commands;Safety/judgement;Awareness  Following Commands: Follows one step commands inconsistently;Follows one step commands with increased time Safety/Judgement: Decreased awareness of safety;Decreased awareness of deficits Awareness: Intellectual    General Comments: Pt nodding at times or using facial expressions such as smiling or eye roll to communicate through session. Pt eaer to move, able to nod affirmative after being told to wait and given instructions for movement.      Exercises Other Exercises Other Exercises: sit-stand from EOB with use of RW and minA to maintain RUE on RW grip. completed x5 in rapid succession with progression from modA to minA/minG to stand Other Exercises: standing marches with RW at EOB. x10 each leg    General Comments General comments (skin integrity, edema, etc.): VSS on RA, RN present to monitor      Pertinent Vitals/Pain Pain Assessment: No/denies pain           PT Goals (current goals can now be found in the care plan section) Acute Rehab PT Goals Patient Stated Goal: none stated due to expressive deficits. pt does state "thank you" as therapist exiting room PT Goal Formulation: With patient Time For Goal Achievement: 07/26/20 Potential to Achieve Goals: Good Progress towards PT goals: Progressing toward goals    Frequency    Min 4X/week      PT Plan Current plan remains appropriate       AM-PAC PT "6 Clicks" Mobility   Outcome Measure  Help needed turning from your back to your side while in a flat bed without using bedrails?: A Little Help needed moving from lying on your back to sitting on the side of a flat bed without using bedrails?: A Little Help needed moving to and from a bed to a chair (including a wheelchair)?: A Lot Help needed standing up from a chair using your arms (e.g., wheelchair or bedside chair)?: A Little Help needed to walk in hospital room?: A Lot Help needed climbing 3-5 steps with a railing? : A Lot 6 Click Score: 15    End of Session Equipment Utilized During Treatment: Gait belt Activity Tolerance: Patient tolerated treatment well Patient left: in bed;with call bell/phone within reach;with bed alarm set Nurse Communication: Mobility  status PT Visit Diagnosis: Muscle weakness (generalized) (M62.81);Other abnormalities of gait and mobility (R26.89)     Time: 1544-1610 PT Time Calculation (min) (ACUTE ONLY): 26 min  Charges:  $Therapeutic Exercise: 8-22 mins $Neuromuscular Re-education: 8-22 mins                     Karma Ganja, PT, DPT   Acute Rehabilitation Department Pager #: [phone]   Otho Bellows 07/13/2020, 4:33 PM

## 2020-07-13 NOTE — Plan of Care (Incomplete)
  Temp:  [98.5 F (36.9 C)-99.9 F (37.7 C)] 99.5 F (37.5 C) (02/19 0800) Pulse Rate:  [65-86] 73 (02/19 1300) Resp:  [12-26] 14 (02/19 1300) BP: (100-161)/(60-90) 155/90 (02/19 1300) SpO2:  [88 %-100 %] 95 % (02/19 1300)  General - morbid obesity, well developed, drowsy sleepy.  Ophthalmologic - fundi not visualized due to noncooperation.  Cardiovascular - irregularly irregular heart rate and rhythm  Neuro - drowsy sleepy, needed repetitive stimulation to keep eye open. Global aphasia, non verbal and not following commands, able to pantomime some action but limited. Left gaze preference but able to have right gaze, blinking to visual threat on the left but not on the right. Right facial droop. Tongue protrusion not cooperative. Spontaneous moving left UE and able against gravity without drift. RUE 3/5 with drift. BLE proximal 3-/5 not cooperative with distal strength testing. Sensation, coordination not cooperative and gait not tested.

## 2020-07-13 NOTE — Progress Notes (Signed)
  Speech Language Pathology  Patient Details Name: Rachael Herrera MRN: [id number] DOB: Apr 16, 1961 Today's Date: 07/13/2020 Time:  -       Checked in with nurse re: po consumption and tolerance after BSE yesterday. RN reports pt will not eat the puree, no observable difficulty with thin. Dys 1 recommended due to oral motor weakness and potential pocketing. Will upgrade texture to Dys 2 and will continue to intervene, observe with upgraded texture and make recommendations accordingly. Asked RN to monitor for pocketing.    [name] 07/13/2020, 12:17 PM  [name].Ed Risk analyst [phone] Office [phone]

## 2020-07-14 DIAGNOSIS — I483 Typical atrial flutter: Secondary | ICD-10-CM

## 2020-07-14 DIAGNOSIS — I6340 Cerebral infarction due to embolism of unspecified cerebral artery: Secondary | ICD-10-CM

## 2020-07-14 LAB — BASIC METABOLIC PANEL
Anion gap: 14 (ref 5–15)
BUN: 56 mg/dL — ABNORMAL HIGH (ref 8–23)
CO2: 23 mmol/L (ref 22–32)
Calcium: 8.5 mg/dL — ABNORMAL LOW (ref 8.9–10.3)
Chloride: 91 mmol/L — ABNORMAL LOW (ref 98–111)
Creatinine, Ser: 11.78 mg/dL — ABNORMAL HIGH (ref 0.44–1.00)
GFR, Estimated: 3 mL/min — ABNORMAL LOW (ref >60)
Glucose, Bld: 245 mg/dL — ABNORMAL HIGH (ref 70–99)
Potassium: 5.8 mmol/L — ABNORMAL HIGH (ref 3.5–5.1)
Sodium: 128 mmol/L — ABNORMAL LOW (ref 135–145)

## 2020-07-14 LAB — CBC
HCT: 36.5 % (ref 36.0–46.0)
Hemoglobin: 11.5 g/dL — ABNORMAL LOW (ref 12.0–15.0)
MCH: 30.4 pg (ref 26.0–34.0)
MCHC: 31.5 g/dL (ref 30.0–36.0)
MCV: 96.6 fL (ref 80.0–100.0)
Platelets: 166 10*3/uL (ref 150–400)
RBC: 3.78 MIL/uL — ABNORMAL LOW (ref 3.87–5.11)
RDW: 15.8 % — ABNORMAL HIGH (ref 11.5–15.5)
WBC: 7 10*3/uL (ref 4.0–10.5)
nRBC: 0 % (ref 0.0–0.2)

## 2020-07-14 LAB — HEPARIN LEVEL (UNFRACTIONATED)
Heparin Unfractionated: 0.34 IU/mL (ref 0.30–0.70)
Heparin Unfractionated: 0.22 IU/mL — ABNORMAL LOW (ref 0.30–0.70)
Heparin Unfractionated: 0.86 IU/mL — ABNORMAL HIGH (ref 0.30–0.70)

## 2020-07-14 LAB — GLUCOSE, CAPILLARY
Glucose-Capillary: 234 mg/dL — ABNORMAL HIGH (ref 70–99)
Glucose-Capillary: 185 mg/dL — ABNORMAL HIGH (ref 70–99)
Glucose-Capillary: 240 mg/dL — ABNORMAL HIGH (ref 70–99)
Glucose-Capillary: 272 mg/dL — ABNORMAL HIGH (ref 70–99)

## 2020-07-14 LAB — HEPATITIS B CORE ANTIBODY, TOTAL: Hep B Core Total Ab: NONREACTIVE

## 2020-07-14 LAB — HEPATITIS B SURFACE ANTIBODY,QUALITATIVE: Hep B S Ab: REACTIVE — AB

## 2020-07-14 LAB — HEPATITIS B SURFACE ANTIGEN: Hepatitis B Surface Ag: NONREACTIVE

## 2020-07-14 MED ORDER — SODIUM CHLORIDE 0.9 % IV SOLN: 100.0000 mL | INTRAVENOUS | Status: DC | PRN

## 2020-07-14 MED ORDER — PENTAFLUOROPROP-TETRAFLUOROETH EX AERO: 1.0000 "application " | INHALATION_SPRAY | CUTANEOUS | Status: DC | PRN

## 2020-07-14 MED ORDER — LIDOCAINE HCL (PF) 1 % IJ SOLN: 5.0000 mL | INTRAMUSCULAR | Status: DC | PRN

## 2020-07-14 MED ORDER — LIDOCAINE-PRILOCAINE 2.5-2.5 % EX CREA
1.0000 "application " | TOPICAL_CREAM | CUTANEOUS | Status: DC | PRN
  Filled 2020-07-14: qty 5

## 2020-07-14 MED ORDER — HEPARIN SODIUM (PORCINE) 1000 UNIT/ML DIALYSIS
1000.0000 [IU] | INTRAMUSCULAR | Status: DC | PRN
  Filled 2020-07-14: qty 1

## 2020-07-14 NOTE — Progress Notes (Signed)
STROKE TEAM PROGRESS NOTE   INTERVAL HISTORY No acute events overnight   Heparin drip continues. Passed for dysphagia diet with whole meds with puree.   Patient remains severely aphasic and unable to participate with ROS.  No family at bedside.   Vitals:   07/13/20 1926 07/13/20 2326 07/14/20 0535 07/14/20 0758  BP: (!) 147/62 (!) 143/70 (!) 142/67 (!) 150/81  Pulse: 79 65 79 65  Resp: '18 18 18 18  '$ Temp: 98.3 F (36.8 C) 98.1 F (36.7 C) 97.8 F (36.6 C) 98 F (36.7 C)  TempSrc: Oral Oral Oral Oral  SpO2: 97% 96% 95% 96%  Weight:      Height:       CBC:  Recent Labs  Lab 07/11/20 1350 07/11/20 1446 07/13/20 0740 07/14/20 0502  WBC 6.4  --  7.1 7.0  NEUTROABS 3.3  --   --   --   HGB 12.6   < > 11.9* 11.5*  HCT 40.9   < > 38.1 36.5  MCV 99.3  --  97.2 96.6  PLT 179  --  164 166   < > = values in this interval not displayed.   Basic Metabolic Panel:  Recent Labs  Lab 07/11/20 2019 07/12/20 1141 07/13/20 0740 07/14/20 0502  NA 130* 134* 132* 128*  K 6.0* 5.2* 5.5* 5.8*  CL 97* 98 97* 91*  CO2 18* 23 20* 23  GLUCOSE 488* 231* 211* 245*  BUN 27* 30* 46* 56*  CREATININE 6.32* 8.13* 9.74* 11.78*  CALCIUM 8.0* 8.4* 8.8* 8.5*  MG  --  1.9  --   --   PHOS 3.7 4.4  --   --    Lipid Panel:  Recent Labs  Lab 07/12/20 0229  CHOL 156  TRIG 382*  HDL 31*  CHOLHDL 5.0  VLDL 76*  LDLCALC 49   HgbA1c:  Recent Labs  Lab 07/12/20 0229  HGBA1C 8.5*   Urine Drug Screen: No results for input(s): LABOPIA, COCAINSCRNUR, LABBENZ, AMPHETMU, THCU, LABBARB in the last 168 hours.  Alcohol Level No results for input(s): ETH in the last 168 hours.  IMAGING past 24 hours No results found. PHYSICAL EXAM Constitutional: Obese middle-aged female sitting up in bed in NAD.  Psych: Affect appropriate to situation Eyes: No scleral injection HENT: No OP obstrucion MSK: no joint deformities.  CV: Irregular rate and rhthym Respiratory: no extra work of breathing  GI: Soft.   No distension. There is no tenderness.  Skin: WDI  Neuro: Mental Status: Patient is awake, alert, severely aphasic, mimics at times but does not follow verbal commands.   Cranial Nerves: II: Visual Fields notable for decreased response to visual threat from the right. Pupils are equal, round, and reactive to light. III,IV, VI: Left gaze preference V: unable to test VII: Facial weakness on the right  VIII: deferred X: gag reflex intact XI: deferred XII: unable to cooperate Motor: Antigravity Bilat UE, +right UE drift,  No movement in right leg to deep nailbed pressure. Moves left arm/leg well and has 5/5 power in the left arm with no drift. Sensory: Sensation is diminished to noxious stimulation throughout right side, lesser degree on the left Deep Tendon Reflexes: areflexic Plantars: deferred Cerebellar: Unable to test  ASSESSMENT/PLAN Rachael Herrera is a 60 y.o. female with PMH significant for ESRD, HTN, OSA on CPAP, HLD, DM2 with peripheral neuropathy, remote seizure d/t hypoglycemia, morbid obesity, BLE claudication, tachybrady syndrome with pacemaker, NSVT, anemia, Vitamin B12 deficiency, MRSA PNA,  chronic bronchitis, hypothyroidism, and  bilat ulnar neuropathy who was LKW at dialysis 2/17. She drove herself to dialysis and then back home again. Soon thereafter, she was noted to have right sided weakness with gaze preference and inability to communicate. EMS was ca;;ed and she was brought in as a code stroke. BP in the 170s, glucose wnl.   Per Dr. Derenda Fennel Consultation: "Of note, she was seen on 07/09/20 in the ED at [facility] for another episode that occurred during dialysis. She became bradycardic and went "blind in the left eye." A CT head was suggested but she left without this test. She had bradycardia and dizziness at the time, perhaps precipitating the spell. Plans were initiated to have her pacemaker replaced. The note from that visit states, "Dr. Myles Rosenthal was  consulted and stated that patient was very symptomatic when she got bradycardic this morning a.m. wanted [place] to make sure to possibly get a CT of her head and admit her to the hospital and let Dr. Minna Merritts See her and decide if he wants to put in a pacemaker now. Dr. Minna Merritts agreed to this plan but the patient stated that she is not going to stay in the hospital, and she will call Dr. Talbot Grumbling office and make arrangements for him to redo her pacemaker. Patient states that she is tired, her symptoms have been completely back to normal, she did not make arrangements to stay here today, and she will just follow-up as an outpatient. She also refused to wait to do a CT of the head."  Acute ischemic infarcts within the left MCA and PCA territories with a small amount of petechial blood products in the left basal ganglia d/t L MCA M2 occlusion s/p successful thrombectomy etiology likely cardioembolic from atrial flutter/fibrillation   Code Stroke: Acute left occipital lobe infarction. Suspected acute infarction of the body of the left caudate. No large vessel occlusion or hemodynamically significant stenosis in the neck. Nonocclusive thrombus at the left MCA bifurcation. Occlusion of a proximal left M3 MCA branch. Patent proximal left PCA.  CTA head & neck:  Acute left occipital lobe infarction. Suspected acute infarction of the body of the left caudate. No large vessel occlusion or hemodynamically significant stenosis in the neck. Nonocclusive thrombus at the left MCA bifurcation. Occlusion of a proximal left M3 MCA branch. Patent proximal left PCA.   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 No midline shift or other mass effect.   2D Echo EF 55-60%, No shunt   LDL 49  HgbA1c 8.5  Heparin drip per stroke protocol initiated 2/18. Heparin level 0.90 today. Pharmacy management appreciated. Hgb  12.6->13.9->11.9  Passed swallow eval for dysphagia diet, RD following. Monitor po intake. RN will notify if po intake is poor.   Therapy recommendations:  CIR  Disposition:  TBD pending, PM&R consult for CIR  Hypertension  Home meds:  Norvasc '10mg'$  daily, losartan '50mg'$  daily demadex '20mg'$  daily.    100-161/60-90  Maintain strict blood pressure control (nephrology following) . Long-term BP goal normotensive  Hyperlipidemia  Home meds:  80 mg Lipitor daily   LDL 49 at goal < 70  Continue statin at discharge  Diabetes type II Uncontrolled  Home meds:  Lantus 80 units at bedtime   HgbA1c 8.5, goal < 7.0  CBGs with labile blood glucose with hypoglycemic episodes x 2 treated with D50 on 2/18  now uptrending Recent Labs    07/13/20 2322 07/14/20 0413 07/14/20 0756  GLUCAP 311* 234* 185*      SSI coverage changed to very sensitive scale   Has been cleared for diet, RN will report if patient is not taking po well.  Defer to nephrology management.   Other Stroke Risk Factors  Former Cigarette smoker x 27 years, quit 2019  Obesity, Body mass index is 39.95 kg/m., BMI >/= 30 associated with increased stroke risk, recommend weight loss, diet and exercise as appropriate   Family hx stroke (Sister)  Obstructive sleep apnea, on CPAP at home  Other Active Problems: ESRD  Nephrology consulted and following. Appreciate assistance.  Hyperkalemia: meg mgmt  Nephrology advises HD planned for today  Daily renal panel   Daily weights, Daily Renal Panel, Strict I/Os, Avoid nephrotoxins (NSAIDs, judicious IV Contrast)  Atrial flutter, new: Known hx of some degree of PVCs, NSVT, and bradycardia with pacemaker and recent history of aborted replacement attempt. Also declined admission and  MICRA implant  2/15 at [facility].  EP consult called. Appreciate recommendations.   Heparin drip initiated 2/18 evening.   No bradycardia episodes seen  Continue cardiac monitoring    Neria Procter A Bailey-Modzik, NP-C Neurology Stroke Service  To contact Stroke Continuity provider, please refer to [email or web address] After hours, contact General Neurology

## 2020-07-14 NOTE — Progress Notes (Signed)
Nephrology Follow-Up Consult note   Assessment/Recommendations: Rachael Herrera is a/an 60 y.o. female with a past medical history significant for ESRD, admitted for CVA after dialysis.     Outpt Records: NEPHROLOGIST: Cay Schillings MD  LOCATION: [facility]  SCHEDULE: T-Th-S 1st Shift  EDW: 273 kg.  KIDNEY: [facility] F200N  LITERS PROC: 73.5 liters/treatment  HD TIME: 210  ACCESS: R IJ PC  NEEDLE SIZE:  ANTICOAG: Standard Heparin Per Protocol  BATH: 3.0CA-HCO3  QB: 350 ml/min  QD: 700 ml/min    ESRD: Continue dialysis based on current prescription.  Will use 2K given hyperkalemia.  Plan for dialysis today given she is off the schedule.  Then plan on dialysis TTS Hyperkalemia: Improved with Lokelma. Dialysis today Hypertension/volume: Volume status and BP acceptable. Get to EDW if tolerated Anemia: hgb acceptable at 13.9 Secondary hyperparathyroidism:home sevelamer when taking p.o.  Would continue Sensipar 60 mg daily DM2: mgmt per primary CVA: s/p thrombectomy. Mgmt per primary   Recommendations conveyed to primary service.    [facility] 07/14/2020 11:07 AM  ___________________________________________________________  CC: ESRD  Interval History/Subjective: Patient continues to be altered but understands a little bit more today.  Did not get dialysis yesterday due to scheduling issues.  Medications:  Current Facility-Administered Medications  Medication Dose Route Frequency Provider Last Rate Last Admin  . acetaminophen (TYLENOL) tablet 650 mg  650 mg Oral Q4H PRN Bailey-Modzik, Delila A, NP       Or  . acetaminophen (TYLENOL) 160 MG/5ML solution 650 mg  650 mg Per Tube Q4H PRN Bailey-Modzik, Delila A, NP       Or  . acetaminophen (TYLENOL) suppository 650 mg  650 mg Rectal Q4H PRN Bailey-Modzik, Delila A, NP   650 mg at 07/11/20 1723  . amitriptyline (ELAVIL) tablet 25 mg  25 mg Oral QHS Bailey-Modzik, Delila A, NP   25 mg at  07/13/20 2227  . atorvastatin (LIPITOR) tablet 80 mg  80 mg Oral Daily Bailey-Modzik, Delila A, NP   80 mg at 07/14/20 0836  . Chlorhexidine Gluconate Cloth 2 % PADS 6 each  6 each Topical Daily Bailey-Modzik, Delila A, NP   6 each at 07/13/20 1045  . cinacalcet (SENSIPAR) tablet 60 mg  60 mg Oral Q breakfast Bailey-Modzik, Delila A, NP   60 mg at 07/13/20 0809  . clevidipine (CLEVIPREX) infusion 0.5 mg/mL  0-21 mg/hr Intravenous Continuous Bailey-Modzik, Delila A, NP   Stopped at 07/11/20 2020  . feeding supplement (NEPRO CARB STEADY) liquid 237 mL  237 mL Oral TID BM Bailey-Modzik, Delila A, NP   237 mL at 07/13/20 2036  . heparin ADULT infusion 100 units/mL (25000 units/264m)  1,250 Units/hr Intravenous Continuous MLavenia Atlas RPH 12.5 mL/hr at 07/14/20 0734 1,250 Units/hr at 07/14/20 0734  . insulin aspart (novoLOG) injection 0-6 Units  0-6 Units Subcutaneous Q4H Bailey-Modzik, Delila A, NP   1 Units at 07/14/20 0836  . MEDLINE mouth rinse  15 mL Mouth Rinse BID Bailey-Modzik, Delila A, NP   15 mL at 07/13/20 2227  . multivitamin (RENA-VIT) tablet 1 tablet  1 tablet Oral QHS Bailey-Modzik, Delila A, NP   1 tablet at 07/13/20 2227  . senna-docusate (Senokot-S) tablet 1 tablet  1 tablet Oral QHS PRN Bailey-Modzik, Delila A, NP          Review of Systems: Unable to answer questions due to AMS  Physical Exam: Vitals:   07/14/20 0535 07/14/20 0758  BP: (Marland Kitchen  142/67 (!) 150/81  Pulse: 79 65  Resp: 18 18  Temp: 97.8 F (36.6 C) 98 F (36.7 C)  SpO2: 95% 96%   Total I/O In: 13.8 [I.V.:13.8] Out: -   Intake/Output Summary (Last 24 hours) at 07/14/2020 1107 Last data filed at 07/14/2020 0734 Gross per 24 hour  Intake 543.81 ml  Output -  Net 543.81 ml   Constitutional: Obese, lying in bed, no distress ENMT: ears and nose without scars or lesions, MMM CV: normal rate, trace edema in the lower extremity Respiratory: Bilateral chest rise, normal work of  breathing Gastrointestinal: soft, non-tender, no palpable masses or hernias Skin: no visible lesions or rashes Psych: Awake but unable to answer questions, confused   Test Results I personally reviewed new and old clinical labs and radiology tests Lab Results  Component Value Date   NA 128 (L) 07/14/2020   K 5.8 (H) 07/14/2020   CL 91 (L) 07/14/2020   CO2 23 07/14/2020   BUN 56 (H) 07/14/2020   CREATININE 11.78 (H) 07/14/2020   CALCIUM 8.5 (L) 07/14/2020   ALBUMIN 3.6 07/12/2020   PHOS 4.4 07/12/2020

## 2020-07-14 NOTE — Progress Notes (Signed)
Pt returned to unit from HD assisted and reposition in bed noted  Edema to  RUA iv site with Heparin drip at 13.5 ml/hr  . Iv stopped  And restarted to rt hand PIV  ,IV team consulted daughter  at bedside plan of care discussed questions answered appropriately and pt request  for breakfast needs for breakfast noted. Vs stable  Telemetry box 30  In use and CCMD  Called for verification. Will continue to monitor pt.

## 2020-07-14 NOTE — Consult Note (Signed)
Physical Medicine and Rehabilitation Consult Reason for Consult: Right side weakness and gaze preference as well as inability to communicate Referring Physician: Dr.Xu   HPI: Rachael Herrera is a 60 y.o. right-handed female with history of hypertension as well as hyperlipidemia, end-stage renal disease with hemodialysis type 2 diabetes mellitus, NSVT /PVCs and bradycardia OSA, morbid obesity BMI 39.95.  Presented 07/11/2020 with right side weakness gaze preference and inability to communicate.  BP in the 170s.  Of note she was seen 07/09/2020 in the ED at [facility] for another episode that occurred during dialysis as well as bouts of bradycardia.  CT the head was suggested but she left without this test.  Patient had been followed in the past for bradycardia consider plans for pacemaker.  Cranial CT scan showed acute left occipital lobe infarction.  Suspect acute infarction of the body of the left caudate.  CTA showed no large vessel occlusion or hemodynamically significant stenosis in the neck.  Nonocclusive thrombus at the left MCA bifurcation.  Occlusion of proximal left M3 MCA branch.  Patient underwent successful thrombectomy per interventional radiology for M2 occlusion.  Echocardiogram with ejection fraction of 55 to 60% no wall motion abnormalities.  Latest MRI imaging showed acute ischemic infarct within the left MCA and PCA territories with a small amount of petechial blood products in left basal ganglia.  No midline shift or mass-effect as of 07/12/2020.  Patient is currently maintained on intravenous heparin and cardiology continues to follow for patient's history of bradycardia as well as a flutter.  She is currently on a dysphagia #2 thin liquid diet.  Hemodialysis ongoing as per renal services.  Therapy evaluations completed due to patient's right side weakness and inability to communicate recommendations for physical medicine rehab consult.  According to daughter would like  to go to [facility] for her rehabilitation Patient is aphasic cannot express her needs  Review of Systems  Unable to perform ROS: Acuity of condition   History reviewed. No pertinent past medical history. Past Surgical History:  Procedure Laterality Date  . IR PERCUTANEOUS ART THROMBECTOMY/INFUSION INTRACRANIAL INC DIAG ANGIO  07/11/2020  . IR US GUIDE VASC ACCESS LEFT  07/12/2020  . RADIOLOGY WITH ANESTHESIA N/A 07/11/2020   Procedure: IR WITH ANESTHESIA;  Surgeon: Radiologist, Medication, MD;  Location: [facility];  Service: Radiology;  Laterality: N/A;   History reviewed. No pertinent family history. Social History:  has no history on file for tobacco use, alcohol use, and drug use. Allergies: No Known Allergies Medications Prior to Admission  Medication Sig Dispense Refill  . amitriptyline (ELAVIL) 25 MG tablet Take 25 mg by mouth at bedtime.    Marland Kitchen amLODipine (NORVASC) 10 MG tablet Take 10 mg by mouth daily.    Marland Kitchen atorvastatin (LIPITOR) 80 MG tablet Take 80 mg by mouth every evening.    . celecoxib (CELEBREX) 100 MG capsule Take 100 mg by mouth daily.    . cholecalciferol (VITAMIN D3) 25 MCG (1000 UNIT) tablet Take 1,000 Units by mouth daily.    . cinacalcet (SENSIPAR) 60 MG tablet Take 60 mg by mouth daily.    Marland Kitchen gabapentin (NEURONTIN) 300 MG capsule Take 300 mg by mouth daily.    Marland Kitchen HUMALOG 100 UNIT/ML injection Inject 3-13 Units into the skin 3 (three) times daily before meals. Per sliding scale    . insulin glargine (LANTUS) 100 UNIT/ML Solostar Pen Inject 80 Units into the skin at bedtime.    Marland Kitchen losartan (  COZAAR) 50 MG tablet Take 50 mg by mouth daily.    . sevelamer carbonate (RENVELA) 800 MG tablet Take 800 mg by mouth 3 (three) times daily.    Marland Kitchen SYNTHROID 75 MCG tablet Take 75 mcg by mouth daily.    Marland Kitchen torsemide (DEMADEX) 20 MG tablet Take 20 mg by mouth daily.    . vitamin B-12 (CYANOCOBALAMIN) 1000 MCG tablet Take 2,000 mcg by mouth daily.    . vitamin E 180 MG (400 UNITS) capsule  Take 400 Units by mouth daily.      Home: Home Living Family/patient expects to be discharged to:: Unsure Additional Comments: pt unable to assist with history due to aphasia, no family present  Functional History: Prior Function Level of Independence: Independent Comments: pt driving herself to dialysis prior to admission Functional Status:  Mobility: Bed Mobility Overal bed mobility: Needs Assistance Bed Mobility: Supine to Sit,Sit to Supine Rolling: Min assist Supine to sit: Mod assist Sit to supine: HOB elevated,Mod assist General bed mobility comments: modA to complete movement of BLE and elevate trunk from HOB. pt with good use of LUE to pull up. able to initiate return to supine, but benefits from minA to BLE to clear over edge of bed Transfers Overall transfer level: Needs assistance Equipment used: Rolling walker (2 wheeled) Transfers: Sit to/from Stand Sit to Stand: Mod assist,From elevated surface Stand pivot transfers: +2 physical assistance,Mod assist General transfer comment: modA with cues for hand positioning, use of momentum, and use of RW to power up from EOB to standing. completed x10 through session with assist to raise RUE to RW grip, then minA at times to assist with pt maintaining grip Ambulation/Gait Ambulation/Gait assistance: Mod assist,+2 physical assistance Gait Distance (Feet): 3 Feet (x2) Assistive device: Rolling walker (2 wheeled) Gait Pattern/deviations: Step-to pattern,Decreased stride length General Gait Details: pt with small lateral steps along EOB. benefits from facilitation of wt shift at hips to allow for clearance of LLE for each step. Gait velocity: decreased    ADL: ADL Overall ADL's : Needs assistance/impaired Eating/Feeding: Moderate assistance Eating/Feeding Details (indicate cue type and reason): spilling cup tryign to pick it up Grooming: Wash/dry face,Minimal assistance,Sitting Grooming Details (indicate cue type and reason):  only washing L side of face. pt with wash cloth placed on face to see if pt would remove it due to communication deficits. pt shaking head to make it fall instead of using either arm Upper Body Bathing: Maximal assistance Lower Body Bathing: Moderate assistance Upper Body Dressing : Moderate assistance Lower Body Dressing: Moderate assistance Toilet Transfer: +2 for physical assistance,Moderate assistance,Stand-pivot Toilet Transfer Details (indicate cue type and reason): simulate EOB to chair General ADL Comments: pt motivated by [employer]. pt with x4 solitaire games loaded on ipad.  Cognition: Cognition Overall Cognitive Status: Impaired/Different from baseline Arousal/Alertness: Awake/alert Orientation Level: Other (comment) Attention: Sustained Sustained Attention: Impaired Sustained Attention Impairment: Verbal basic,Functional basic Memory:  (TBA) Awareness: Impaired Awareness Impairment: Emergent impairment Problem Solving: Impaired Problem Solving Impairment: Functional basic Behaviors: Impulsive Safety/Judgment: Impaired Cognition Arousal/Alertness: Lethargic,Awake/alert (initially sleeping, but increased alertness easily) Behavior During Therapy: Impulsive Overall Cognitive Status: Impaired/Different from baseline Area of Impairment: Following commands,Safety/judgement,Awareness Following Commands: Follows one step commands inconsistently,Follows one step commands with increased time Safety/Judgement: Decreased awareness of safety,Decreased awareness of deficits Awareness: Intellectual General Comments: Pt nodding at times or using facial expressions such as smiling or eye roll to communicate through session. Pt eaer to move, able to nod affirmative after being told to  wait and given instructions for movement.  Blood pressure (!) 150/65, pulse 70, temperature 98.9 F (37.2 C), temperature source Oral, resp. rate 16, height '5\' 7"'$  (1.702 m), weight (P) 116.8 kg, SpO2 96  %. Physical Exam Vitals and nursing note reviewed.  Constitutional:      Appearance: She is obese.  HENT:     Head: Normocephalic and atraumatic.  Eyes:     Extraocular Movements: Extraocular movements intact.     Conjunctiva/sclera: Conjunctivae normal.     Pupils: Pupils are equal, round, and reactive to light.  Cardiovascular:     Rate and Rhythm: Normal rate and regular rhythm.     Heart sounds: No murmur heard.   Pulmonary:     Effort: Pulmonary effort is normal. No respiratory distress.     Breath sounds: Normal breath sounds. No stridor. No wheezing.  Abdominal:     General: Abdomen is flat. Bowel sounds are normal. There is no distension.     Palpations: Abdomen is soft.     Tenderness: There is no abdominal tenderness.  Musculoskeletal:     Comments: No pain with upper extremity or lower extremity range of motion no evidence of joint swelling in the knees fingers or wrists  Skin:    General: Skin is warm and dry.  Neurological:     Mental Status: She is alert and oriented to person, place, and time.     Comments: Patient is alert.  Severely aphasic.  She did not follow verbal or motor commands.  Examination overall limited due to aphasia.  Motor strength is 4/5 in the right deltoid, bicep, tricep, grip, hip flexor, knee extensor, ankle dorsiflexor and plantar flexor 5/5 in the left deltoid, bicep, tricep, grip, hip flexor knee extensor ankle dorsiflexion plantarflexion Manual muscle testing is accomplished with gestural cues patient is unable to follow verbal cues including differentiating right versus left. Sensation could not be assessed secondary to aphasia. Tone is normal in bilateral upper and lower extremities  Psychiatric:        Mood and Affect: Mood normal.        Behavior: Behavior normal.     Results for orders placed or performed during the hospital encounter of 07/11/20 (from the past 24 hour(s))  Glucose, capillary     Status: Abnormal   Collection  Time: 07/14/20  7:56 AM  Result Value Ref Range   Glucose-Capillary 185 (H) 70 - 99 mg/dL  Heparin level (unfractionated)     Status: Abnormal   Collection Time: 07/14/20 10:39 AM  Result Value Ref Range   Heparin Unfractionated 0.22 (L) 0.30 - 0.70 IU/mL  Glucose, capillary     Status: Abnormal   Collection Time: 07/14/20 12:02 PM  Result Value Ref Range   Glucose-Capillary 240 (H) 70 - 99 mg/dL  Hepatitis B surface antigen     Status: None   Collection Time: 07/14/20  5:20 PM  Result Value Ref Range   Hepatitis B Surface Ag NON REACTIVE NON REACTIVE  Hepatitis B surface antibody,qualitative     Status: Abnormal   Collection Time: 07/14/20  5:21 PM  Result Value Ref Range   Hep B S Ab Reactive (A) NON REACTIVE  Hepatitis B core antibody, total     Status: None   Collection Time: 07/14/20  5:21 PM  Result Value Ref Range   Hep B Core Total Ab NON REACTIVE NON REACTIVE  Glucose, capillary     Status: Abnormal   Collection Time: 07/14/20  8:16  PM  Result Value Ref Range   Glucose-Capillary 272 (H) 70 - 99 mg/dL  Heparin level (unfractionated)     Status: Abnormal   Collection Time: 07/14/20  8:42 PM  Result Value Ref Range   Heparin Unfractionated 0.86 (H) 0.30 - 0.70 IU/mL  Glucose, capillary     Status: Abnormal   Collection Time: 07/14/20 11:59 PM  Result Value Ref Range   Glucose-Capillary 460 (H) 70 - 99 mg/dL  Glucose, capillary     Status: Abnormal   Collection Time: 07/15/20  4:15 AM  Result Value Ref Range   Glucose-Capillary 310 (H) 70 - 99 mg/dL   No results found.   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 Does the need for close, 24 hr/day medical supervision in concert with the patient's rehab needs make it unreasonable for this patient to be served in a less intensive setting? Yes 2. Co-Morbidities requiring supervision/potential complications: End-stage renal  disease on hemodialysis, type 2 diabetes uncontrolled, morbid obesity, hypertension uncontrolled 3. Due to bladder management, bowel management, safety, skin/wound care, disease management, medication administration, pain management and patient education, does the patient require 24 hr/day rehab nursing? Yes 4. Does the patient require coordinated care of a physician, rehab nurse, therapy disciplines of PT, OT, speech to address physical and functional deficits in the context of the above medical diagnosis(es)? Yes Addressing deficits in the following areas: balance, endurance, locomotion, strength, transferring, bowel/bladder control, bathing, dressing, toileting, cognition, speech, language and psychosocial support 5. Can the patient actively participate in an intensive therapy program of at least 3 hrs of therapy per day at least 5 days per week? Yes 6. The potential for patient to make measurable gains while on inpatient rehab is good 7. Anticipated functional outcomes upon discharge from inpatient rehab are supervision  with PT, supervision with OT, supervision with SLP. 8. Estimated rehab length of stay to reach the above functional goals is: 16 to 21 days 9. Anticipated discharge destination: Home 10. Overall Rehab/Functional Prognosis: good  RECOMMENDATIONS: This patient's condition is appropriate for continued rehabilitative care in the following setting: CIR Patient has agreed to participate in recommended program. Yes Note that insurance prior authorization may be required for reimbursement for recommended care.  Comment: Patient/family would like to have rehabilitation in [place], [place] 07/15/2020   "I have personally performed a face to face diagnostic evaluation of this patient.  Additionally, I have reviewed and concur with the physician assistant's documentation above." Charlett Blake M.D. [facility] Fellow Am Acad of Phys Med and  Rehab Diplomate Am Board of Electrodiagnostic Med Fellow Am Board of Interventional Pain

## 2020-07-14 NOTE — Progress Notes (Signed)
ANTICOAGULATION CONSULT NOTE  Pharmacy Consult for heparin Indication: atrial fibrillation  No Known Allergies  Patient Measurements: Height: '5\' 7"'$  (170.2 cm) Weight: (P) 116.8 kg (257 lb 8 oz) IBW/kg (Calculated) : 61.6 Heparin Dosing Weight: 88kg  Vital Signs: Temp: 98.5 F (36.9 C) (02/20 2016) Temp Source: Oral (02/20 2016) BP: 157/63 (02/20 2016) Pulse Rate: 79 (02/20 2023)  Labs: Recent Labs    07/12/20 1141 07/12/20 2247 07/13/20 0740 07/13/20 0940 07/14/20 0502 07/14/20 1039 07/14/20 2042  HGB  --   --  11.9*  --  11.5*  --   --   HCT  --   --  38.1  --  36.5  --   --   PLT  --   --  164  --  166  --   --   HEPARINUNFRC  --    < > 0.90*   < > 0.34 0.22* 0.86*  CREATININE 8.13*  --  9.74*  --  11.78*  --   --    < > = values in this interval not displayed.    Estimated Creatinine Clearance: 6.7 mL/min (A) (by C-G formula based on SCr of 11.78 mg/dL (H)).   Assessment: 46 YOF presenting as code stroke, MCA infarct, no tPA and TICI3 in IR.  Now with new onset aflutter found, CHA2DS2VASc 6.  Not on anticoagulation PTA.  Heparin level this evening is elevated s/p HD.  No overt bleeding or complications noted.  Goal of Therapy:  Heparin level 0.3-0.5 units/ml Monitor platelets by anticoagulation protocol: Yes   Plan:  -Decrease IV heparin to 1200 units/hr. -F/u 8 hr HL  -Monitor closely for s/s of bleeding   Thank you for involving pharmacy in this patient's care.  [place] Crane, Roylene Reason, BCCP Clinical Pharmacist  07/14/2020 9:37 PM   [facility] pharmacy phone numbers are listed on [email or web address]

## 2020-07-14 NOTE — Progress Notes (Signed)
Inpatient Rehab Admissions Coordinator:   I spoke with Pt.'s daughter and aunt who stated that they are interested in inpatient rehab for this Pt.; however, they live in [place] and are interested in [place] if possible. They would like to pursue CIR if [place] cannot take her. I have contacted case managers to request a referral be sent to [facility].   Clemens Catholic, [place], [place] Admissions Coordinator  [phone] ([place]) [phone] (office)

## 2020-07-14 NOTE — Progress Notes (Signed)
ANTICOAGULATION CONSULT NOTE  Pharmacy Consult for heparin Indication: atrial fibrillation  No Known Allergies  Patient Measurements: Height: '5\' 7"'$  (170.2 cm) Weight: 115.7 kg (255 lb 1.2 oz) IBW/kg (Calculated) : 61.6 Heparin Dosing Weight: 88kg  Vital Signs: Temp: 98 F (36.7 C) (02/20 0758) Temp Source: Oral (02/20 0758) BP: 150/81 (02/20 0758) Pulse Rate: 65 (02/20 0758)  Labs: Recent Labs    07/11/20 1350 07/11/20 1446 07/11/20 2019 07/12/20 1141 07/12/20 2247 07/13/20 0740 07/13/20 0940 07/13/20 1851 07/14/20 0502 07/14/20 1039  HGB 12.6 13.9  --   --   --  11.9*  --   --  11.5*  --   HCT 40.9 41.0  --   --   --  38.1  --   --  36.5  --   PLT 179  --   --   --   --  164  --   --  166  --   APTT 28  --   --   --   --   --   --   --   --   --   LABPROT 12.7  --   --   --   --   --   --   --   --   --   INR 1.0  --   --   --   --   --   --   --   --   --   HEPARINUNFRC  --   --   --   --    < > 0.90*   < > 0.24* 0.34 0.22*  CREATININE 6.16* 6.10*   < > 8.13*  --  9.74*  --   --  11.78*  --    < > = values in this interval not displayed.    Estimated Creatinine Clearance: 6.6 mL/min (A) (by C-G formula based on SCr of 11.78 mg/dL (H)).   Assessment: 83 YOF presenting as code stroke, MCA infarct, no tPA and TICI3 in IR.  Now with new onset aflutter found, CHA2DS2VASc 6.  Not on anticoagulation PTA.  Heparin level this morning is now subtherapeutic at 0.22 on heparin infusion at 1250 units/hr. RN reports no overt s/s of bleeding   Goal of Therapy:  Heparin level 0.3-0.5 units/ml Monitor platelets by anticoagulation protocol: Yes   Plan:  -Increase heparin infusion to 1350 units/hr -F/u 8 hr HL  -Monitor closely for s/s of bleeding   Thank you for involving pharmacy in this patient's care.  Kerby Nora, PharmD, BCPS Clinical Pharmacist Please refer to [facility] for unit-specific pharmacist

## 2020-07-14 NOTE — Progress Notes (Signed)
ANTICOAGULATION CONSULT NOTE - Follow Up Consult  Pharmacy Consult for heparin Indication: atrial fibrillation and stroke  Labs: Recent Labs    07/11/20 1350 07/11/20 1446 07/11/20 2019 07/12/20 1141 07/12/20 2247 07/13/20 0740 07/13/20 0940 07/13/20 1851 07/14/20 0502  HGB 12.6 13.9  --   --   --  11.9*  --   --  11.5*  HCT 40.9 41.0  --   --   --  38.1  --   --  36.5  PLT 179  --   --   --   --  164  --   --  166  APTT 28  --   --   --   --   --   --   --   --   LABPROT 12.7  --   --   --   --   --   --   --   --   INR 1.0  --   --   --   --   --   --   --   --   HEPARINUNFRC  --   --   --   --    < > 0.90* 0.25* 0.24* 0.34  CREATININE 6.16* 6.10* 6.32* 8.13*  --  9.74*  --   --   --    < > = values in this interval not displayed.    Assessment/Plan:  60yo female therapeutic on heparin after rate changes. Will continue gtt at current rate and confirm stable with additional level.   Wynona Neat, PharmD, BCPS  07/14/2020,6:06 AM

## 2020-07-14 NOTE — Progress Notes (Signed)
Telephoned and spoke with Dialysis Dept team member providing update regarding patient's current room location as well as to inquire about plans for patient's hemodialysis treatment.

## 2020-07-14 NOTE — Progress Notes (Addendum)
STROKE TEAM PROGRESS NOTE   INTERVAL HISTORY No acute events overnight   Heparin drip continues.  No further hypoglycemic episodes. Looks like she ate about 40% of her breakfast tray. Neurologically and hemodynamically stable.  Hemodialysis today.  Patient remains severely aphasic and unable to participate with ROS. She is trying to speak and shows frustration with attempts.  No family at bedside.   Vitals:   07/13/20 2326 07/14/20 0535 07/14/20 0758 07/14/20 1159  BP: (!) 143/70 (!) 142/67 (!) 150/81 126/62  Pulse: 65 79 65 61  Resp: '18 18 18   '$ Temp: 98.1 F (36.7 C) 97.8 F (36.6 C) 98 F (36.7 C) 97.9 F (36.6 C)  TempSrc: Oral Oral Oral Oral  SpO2: 96% 95% 96% 99%  Weight:      Height:       CBC:  Recent Labs  Lab 07/11/20 1350 07/11/20 1446 07/13/20 0740 07/14/20 0502  WBC 6.4  --  7.1 7.0  NEUTROABS 3.3  --   --   --   HGB 12.6   < > 11.9* 11.5*  HCT 40.9   < > 38.1 36.5  MCV 99.3  --  97.2 96.6  PLT 179  --  164 166   < > = values in this interval not displayed.   Basic Metabolic Panel:  Recent Labs  Lab 07/11/20 2019 07/12/20 1141 07/13/20 0740 07/14/20 0502  NA 130* 134* 132* 128*  K 6.0* 5.2* 5.5* 5.8*  CL 97* 98 97* 91*  CO2 18* 23 20* 23  GLUCOSE 488* 231* 211* 245*  BUN 27* 30* 46* 56*  CREATININE 6.32* 8.13* 9.74* 11.78*  CALCIUM 8.0* 8.4* 8.8* 8.5*  MG  --  1.9  --   --   PHOS 3.7 4.4  --   --    Lipid Panel:  Recent Labs  Lab 07/12/20 0229  CHOL 156  TRIG 382*  HDL 31*  CHOLHDL 5.0  VLDL 76*  LDLCALC 49   HgbA1c:  Recent Labs  Lab 07/12/20 0229  HGBA1C 8.5*   Urine Drug Screen: No results for input(s): LABOPIA, COCAINSCRNUR, LABBENZ, AMPHETMU, THCU, LABBARB in the last 168 hours.  Alcohol Level No results for input(s): ETH in the last 168 hours.  IMAGING past 24 hours No results found. PHYSICAL EXAM Constitutional: Obese middle-aged female sitting up in bed in NAD.  Psych: Affect appropriate to situation Eyes: No  scleral injection HENT: No OP obstrucion MSK: no joint deformities.  CV: Irregular rate and rhthym Respiratory: no extra work of breathing  GI: Soft.  No distension. There is no tenderness.  Skin: WDI  Neuro: Mental Status: Patient is awake, alert, severely aphasic, mimics at times, improved following of verbal commands today Cranial Nerves: II: Visual Fields notable for decreased response to visual threat from the right. Pupils are equal, round, and reactive to light. III,IV, VI: Left gaze preference V: unable to test VII: Facial weakness on the right  VIII: deferred X: gag reflex intact XI: deferred XII: unable to cooperate Motor: Antigravity Bilat UE, +right UE drift,  No movement in right leg to deep nailbed pressure. Moves left arm/leg well and has 5/5 power in the left arm with no drift. Sensory: Sensation is diminished to noxious stimulation throughout right side, lesser degree on the left Deep Tendon Reflexes: areflexic Plantars: deferred Cerebellar: Unable to test  ASSESSMENT/PLAN Rachael Herrera is a 60 y.o. female with PMH significant for ESRD, HTN, OSA on CPAP, HLD, DM2 with peripheral neuropathy,  remote seizure d/t hypoglycemia, morbid obesity, BLE claudication, tachybrady syndrome with pacemaker, NSVT, anemia, Vitamin B12 deficiency, MRSA PNA, chronic bronchitis, hypothyroidism, and  bilat ulnar neuropathy who was LKW at dialysis 2/17. She drove herself to dialysis and then back home again. Soon thereafter, she was noted to have right sided weakness with gaze preference and inability to communicate. EMS was ca;;ed and she was brought in as a code stroke. BP in the 170s, glucose wnl.   Stroke - Acute ischemic infarcts within the left MCA and PCA territories with a small amount of petechial blood products in the left basal ganglia d/t L MCA M2 occlusion s/p successful thrombectomy etiology likely cardioembolic from atrial flutter/fibrillation  CT showed left occipital  and left caudate infarct.    CTA head and neck left M1/M2 thrombus, left M3 occlusion.    IR performed with left M2 TICI3 reperfusion.    MRI showed left MCA and PCA infarct with BG petechial hemorrhage.    2D Echo EF 55-60%, No shunt   LDL 49  HgbA1c 8.5  Now on Heparin drip per stroke protocol initiated 2/18.    Therapy recommendations:  CIR  Disposition:  pending, PM&R consult for CIR here vs. Family's preference in [place]   Atrial flutter, new diagnosis  Known hx of some degree of PVCs, NSVT, and bradycardia with pacemaker and recent history of aborted replacement attempt. Also declined admission and  MICRA implant  2/15 at [facility].  EP consult called for possible aflutter. Appreciate recommendations.   Heparin drip initiated 2/18 evening.   No bradycardia episodes seen  Continue cardiac monitoring   Hypertension  Home meds:  Norvasc '10mg'$  daily, losartan '50mg'$  daily demadex '20mg'$  daily.    BP stable  Maintain strict blood pressure control (nephrology following) . Long-term BP goal normotensive   Hyperlipidemia  Home meds:  80 mg Lipitor daily   LDL 49 at goal < 70  On lipitor 80 now  Continue statin at discharge  Diabetes type II Uncontrolled  Home meds:  Lantus 80 units at bedtime   HgbA1c 8.5, goal < 7.0  CBGs with labile blood glucose with hypoglycemic episodes x 2 treated with D50 on 2/18 now improved  SSI   ESRD  Nephrology consulted and following. Appreciate assistance.  Hyperkalemia: HD today  Daily renal panel   Daily weights, Daily Renal Panel, Strict I/Os, Avoid nephrotoxins (NSAIDs, judicious IV Contrast)  Other Stroke Risk Factors  Former Cigarette smoker x 27 years, quit 2019  Obesity, Body mass index is 39.95 kg/m., BMI >/= 30 associated with increased stroke risk, recommend weight loss, diet and exercise as appropriate   Family hx stroke (Sister)  Obstructive sleep apnea, on CPAP at home  Other Active  Problems:    Hetty Blend, NP-C Neurology Stroke Service   ATTENDING NOTE: I reviewed above note and agree with the assessment and plan. Pt was seen and examined.   Pt seen in HD suite. Pt awake alert, still has global aphasia, only able to follow commands of eye closure and opening, not following other commands but able to pantomime. Able to have b/l gaze and tracking. Blinking to visual threat on the left but not on the right. Right facial droop. Able to hold BUEs without drift. BLE proximal and distal 3/5.   Pt still on heparin IV, will consider to switch to eliquis on discharge to CIR. Continue statin. Detailed assessment and plan please refer to above as I have made changes when appropriate.  [name], MD PhD Stroke Neurology 07/14/2020 10:14 PM     To contact Stroke Continuity provider, please refer to [email or web address] After hours, contact General Neurology

## 2020-07-15 DIAGNOSIS — I10 Essential (primary) hypertension: Secondary | ICD-10-CM

## 2020-07-15 DIAGNOSIS — N186 End stage renal disease: Secondary | ICD-10-CM

## 2020-07-15 DIAGNOSIS — I63132 Cerebral infarction due to embolism of left carotid artery: Secondary | ICD-10-CM | POA: Diagnosis not present

## 2020-07-15 LAB — CBC
HCT: 34.2 % — ABNORMAL LOW (ref 36.0–46.0)
Hemoglobin: 11.1 g/dL — ABNORMAL LOW (ref 12.0–15.0)
MCH: 31.1 pg (ref 26.0–34.0)
MCHC: 32.5 g/dL (ref 30.0–36.0)
MCV: 95.8 fL (ref 80.0–100.0)
Platelets: 175 10*3/uL (ref 150–400)
RBC: 3.57 MIL/uL — ABNORMAL LOW (ref 3.87–5.11)
RDW: 15.9 % — ABNORMAL HIGH (ref 11.5–15.5)
WBC: 6.4 10*3/uL (ref 4.0–10.5)
nRBC: 0 % (ref 0.0–0.2)

## 2020-07-15 LAB — BASIC METABOLIC PANEL
Anion gap: 15 (ref 5–15)
Anion gap: 15 (ref 5–15)
BUN: 36 mg/dL — ABNORMAL HIGH (ref 8–23)
BUN: 47 mg/dL — ABNORMAL HIGH (ref 8–23)
CO2: 22 mmol/L (ref 22–32)
CO2: 22 mmol/L (ref 22–32)
Calcium: 8.6 mg/dL — ABNORMAL LOW (ref 8.9–10.3)
Calcium: 8.2 mg/dL — ABNORMAL LOW (ref 8.9–10.3)
Chloride: 95 mmol/L — ABNORMAL LOW (ref 98–111)
Chloride: 92 mmol/L — ABNORMAL LOW (ref 98–111)
Creatinine, Ser: 8.63 mg/dL — ABNORMAL HIGH (ref 0.44–1.00)
Creatinine, Ser: 9.67 mg/dL — ABNORMAL HIGH (ref 0.44–1.00)
GFR, Estimated: 5 mL/min — ABNORMAL LOW (ref >60)
GFR, Estimated: 4 mL/min — ABNORMAL LOW (ref >60)
Glucose, Bld: 260 mg/dL — ABNORMAL HIGH (ref 70–99)
Glucose, Bld: 420 mg/dL — ABNORMAL HIGH (ref 70–99)
Potassium: 4.5 mmol/L (ref 3.5–5.1)
Potassium: 4.7 mmol/L (ref 3.5–5.1)
Sodium: 132 mmol/L — ABNORMAL LOW (ref 135–145)
Sodium: 129 mmol/L — ABNORMAL LOW (ref 135–145)

## 2020-07-15 LAB — GLUCOSE, CAPILLARY
Glucose-Capillary: 216 mg/dL — ABNORMAL HIGH (ref 70–99)
Glucose-Capillary: 310 mg/dL — ABNORMAL HIGH (ref 70–99)
Glucose-Capillary: 327 mg/dL — ABNORMAL HIGH (ref 70–99)
Glucose-Capillary: 388 mg/dL — ABNORMAL HIGH (ref 70–99)
Glucose-Capillary: 416 mg/dL — ABNORMAL HIGH (ref 70–99)
Glucose-Capillary: 422 mg/dL — ABNORMAL HIGH (ref 70–99)
Glucose-Capillary: 460 mg/dL — ABNORMAL HIGH (ref 70–99)

## 2020-07-15 LAB — HEPARIN LEVEL (UNFRACTIONATED)
Heparin Unfractionated: 0.31 IU/mL (ref 0.30–0.70)
Heparin Unfractionated: 0.25 IU/mL — ABNORMAL LOW (ref 0.30–0.70)

## 2020-07-15 MED ORDER — INSULIN GLARGINE 100 UNIT/ML ~~LOC~~ SOLN
20.0000 [IU] | Freq: Every day | SUBCUTANEOUS | Status: DC
  Administered 2020-07-15 – 2020-07-17 (×3): 20 [IU] via SUBCUTANEOUS
  Filled 2020-07-15 (×4): qty 0.2

## 2020-07-15 MED ORDER — SEVELAMER CARBONATE 800 MG PO TABS
800.0000 mg | ORAL_TABLET | Freq: Three times a day (TID) | ORAL | Status: DC
  Administered 2020-07-15 – 2020-07-18 (×9): 800 mg via ORAL
  Filled 2020-07-15 (×9): qty 1

## 2020-07-15 MED ORDER — INSULIN ASPART 100 UNIT/ML ~~LOC~~ SOLN
7.0000 [IU] | Freq: Once | SUBCUTANEOUS | Status: AC
  Administered 2020-07-15: 7 [IU] via SUBCUTANEOUS

## 2020-07-15 MED ORDER — INSULIN ASPART 100 UNIT/ML ~~LOC~~ SOLN
0.0000 [IU] | SUBCUTANEOUS | Status: DC
  Administered 2020-07-15: 3 [IU] via SUBCUTANEOUS
  Administered 2020-07-15: 9 [IU] via SUBCUTANEOUS
  Administered 2020-07-15: 7 [IU] via SUBCUTANEOUS
  Administered 2020-07-16: 3 [IU] via SUBCUTANEOUS
  Administered 2020-07-16: 7 [IU] via SUBCUTANEOUS
  Administered 2020-07-16: 9 [IU] via SUBCUTANEOUS
  Administered 2020-07-16: 5 [IU] via SUBCUTANEOUS
  Administered 2020-07-16: 1 [IU] via SUBCUTANEOUS
  Administered 2020-07-17: 9 [IU] via SUBCUTANEOUS
  Administered 2020-07-17: 5 [IU] via SUBCUTANEOUS
  Administered 2020-07-17: 3 [IU] via SUBCUTANEOUS
  Administered 2020-07-17: 2 [IU] via SUBCUTANEOUS
  Administered 2020-07-17: 3 [IU] via SUBCUTANEOUS

## 2020-07-15 MED ORDER — CHLORHEXIDINE GLUCONATE CLOTH 2 % EX PADS
6.0000 | MEDICATED_PAD | Freq: Every day | CUTANEOUS | Status: DC
  Administered 2020-07-16 – 2020-07-17 (×2): 6 via TOPICAL

## 2020-07-15 MED ORDER — INSULIN ASPART 100 UNIT/ML ~~LOC~~ SOLN
6.0000 [IU] | Freq: Once | SUBCUTANEOUS | Status: AC
  Administered 2020-07-15: 6 [IU] via SUBCUTANEOUS

## 2020-07-15 MED ORDER — INSULIN ASPART 100 UNIT/ML ~~LOC~~ SOLN
0.0000 [IU] | Freq: Three times a day (TID) | SUBCUTANEOUS | Status: DC
  Administered 2020-07-15: 9 [IU] via SUBCUTANEOUS

## 2020-07-15 NOTE — Progress Notes (Addendum)
STROKE TEAM PROGRESS NOTE   INTERVAL HISTORY No acute events   Heparin drip continues.   Neurologically and hemodynamically stable.  Patient remains severely aphasic and unable to participate with ROS. She is continues to show frustration with attempts. Attempted picture chart but she did not appear able to process or focus on it. I could not find any eyeglasses for her at the bedside.  No family at bedside.   Vitals:   07/14/20 2359 07/15/20 0416 07/15/20 0803 07/15/20 1200  BP: (!) 130/56 (!) 150/65 (!) 152/66 (!) 163/54  Pulse: 76 70 62 (!) 54  Resp: '18 16 18 20  '$ Temp: (!) 97.5 F (36.4 C) 98.9 F (37.2 C) 98.5 F (36.9 C) 98.1 F (36.7 C)  TempSrc: Oral Oral    SpO2: 95% 96% 95% 97%  Weight:      Height:       CBC:  Recent Labs  Lab 07/11/20 1350 07/11/20 1446 07/14/20 0502 07/15/20 0604  WBC 6.4   < > 7.0 6.4  NEUTROABS 3.3  --   --   --   HGB 12.6   < > 11.5* 11.1*  HCT 40.9   < > 36.5 34.2*  MCV 99.3   < > 96.6 95.8  PLT 179   < > 166 175   < > = values in this interval not displayed.   Basic Metabolic Panel:  Recent Labs  Lab 07/11/20 2019 07/12/20 1141 07/13/20 0740 07/14/20 0502 07/15/20 0604  NA 130* 134*   < > 128* 132*  K 6.0* 5.2*   < > 5.8* 4.5  CL 97* 98   < > 91* 95*  CO2 18* 23   < > 23 22  GLUCOSE 488* 231*   < > 245* 260*  BUN 27* 30*   < > 56* 36*  CREATININE 6.32* 8.13*   < > 11.78* 8.63*  CALCIUM 8.0* 8.4*   < > 8.5* 8.6*  MG  --  1.9  --   --   --   PHOS 3.7 4.4  --   --   --    < > = values in this interval not displayed.   Lipid Panel:  Recent Labs  Lab 07/12/20 0229  CHOL 156  TRIG 382*  HDL 31*  CHOLHDL 5.0  VLDL 76*  LDLCALC 49   HgbA1c:  Recent Labs  Lab 07/12/20 0229  HGBA1C 8.5*   Urine Drug Screen: No results for input(s): LABOPIA, COCAINSCRNUR, LABBENZ, AMPHETMU, THCU, LABBARB in the last 168 hours.  Alcohol Level No results for input(s): ETH in the last 168 hours.  IMAGING past 24 hours No results  found. PHYSICAL EXAM Constitutional: Obese middle-aged female sitting up in bed in NAD.  Psych: Affect appropriate to situation Eyes: No scleral injection HENT: No OP obstrucion MSK: no joint deformities.  CV: Irregular rate and rhthym Respiratory: no extra work of breathing  GI: Soft.  No distension. There is no tenderness.  Skin: WDI  Neuro: Mental Status: Patient is awake, alert, severely aphasic, mimics at times, improved following of verbal commands over the past couple of days Cranial Nerves: II: Visual Fields notable for decreased response to visual threat from the right. Pupils are equal, round, and reactive to light. III,IV, VI: Left gaze preference V: unable to test VII: Facial weakness on the right  VIII: deferred X: gag reflex intact XI: deferred XII: unable to cooperate Motor: Antigravity Bilat UE, +right UE drift,  No movement in right  leg to deep nailbed pressure. Moves left arm/leg well and has 5/5 power in the left arm with no drift. Sensory: Sensation is diminished to noxious stimulation throughout right side, lesser degree on the left Deep Tendon Reflexes: areflexic Plantars: deferred Cerebellar: Unable to test  ASSESSMENT/PLAN Rachael Herrera is a 60 y.o. female with PMH significant for ESRD, HTN, OSA on CPAP, HLD, DM2 with peripheral neuropathy, remote seizure d/t hypoglycemia, morbid obesity, BLE claudication, tachybrady syndrome with pacemaker, NSVT, anemia, Vitamin B12 deficiency, MRSA PNA, chronic bronchitis, hypothyroidism, and  bilat ulnar neuropathy who was LKW at dialysis 2/17. She drove herself to dialysis and then back home again. Soon thereafter, she was noted to have right sided weakness with gaze preference and inability to communicate. EMS was called and she was brought in as a code stroke. BP in the 170s, glucose wnl.   Stroke - Acute ischemic infarcts within the left MCA and PCA territories with a small amount of petechial blood products in  the left basal ganglia d/t L MCA M2 occlusion s/p successful thrombectomy etiology likely cardioembolic from atrial flutter/fibrillation  CT showed left occipital and left caudate infarct.    CTA head and neck left M1/M2 thrombus, left M3 occlusion.    IR performed with left M2 TICI3 reperfusion.    MRI showed left MCA and PCA infarct with BG petechial hemorrhage.    2D Echo EF 55-60%, No shunt   LDL 49  HgbA1c 8.5  No antithrombotics PTA, now on Heparin drip per stroke protocol initiated 2/18. Consider switch to eliquis tomorrow (day 5 post stroke)  Therapy recommendations:  CIR  Disposition:  pending, PM&R consult for CIR here vs. Family's preference in [place]   Atrial flutter, new diagnosis  Known hx of some degree of PVCs, NSVT, and bradycardia with pacemaker and recent history of aborted replacement attempt. Also declined admission and  MICRA implant  2/15 at [facility].  EP consult called for possible aflutter. Appreciate recommendations.   Heparin drip initiated 2/18 evening. Consider transition to Eliquis tomorrow at day 5 post stroke.   No bradycardia episodes seen  Continue cardiac monitoring   Hypertension  Home meds:  Norvasc '10mg'$  daily, losartan '50mg'$  daily demadex '20mg'$  daily.    BP stable  Maintain strict blood pressure control (nephrology following) . Long-term BP goal normotensive   Hyperlipidemia  Home meds:  80 mg Lipitor daily   LDL 49 at goal < 70  On lipitor 80 now  Continue statin at discharge  Diabetes type II Uncontrolled  Home meds:  Lantus 80 units at bedtime   HgbA1c 8.5, goal < 7.0  CBGs with labile blood glucose with hypoglycemic episodes x 2 treated with D50 on 2/18 now improved  SSI   ESRD on HD  Nephrology consulted and following. Appreciate assistance.  Hyperkalemia: HD today  Daily renal panel   Daily weights, Daily Renal Panel, Strict I/Os, Avoid nephrotoxins (NSAIDs, judicious IV Contrast)  Other Stroke Risk  Factors  Former Cigarette smoker x 27 years, quit 2019  Obesity, Body mass index is 40.33 kg/m (pended)., BMI >/= 30 associated with increased stroke risk, recommend weight loss, diet and exercise as appropriate   Family hx stroke (Sister)  Obstructive sleep apnea, on CPAP at home  Other Active Problems:    Hetty Blend, NP-C Neurology Stroke Service   ATTENDING NOTE: I reviewed above note and agree with the assessment and plan. Pt was seen and examined.   Pt lying in bed, no  acute event overnight. Had HD yesterday. Still has hyperglycemia, diabetic coordinator on board and will add lantus 20 U daily today. She still has global aphasia, nonverbal, but able to close eyes and open mouth as well as make fist of left hand on request, however then perseverated on open mouth and make fist, not following other commands. Right facial droop. Able to hold BUEs without drift. BLE proximal and distal 3/5.   Will consider to switch heparin IV to eliquis tomorrow at day 5 post stroke. Continue statin. PT/OT recommend CIR.   For detailed assessment and plan, please refer to above as I have made changes wherever appropriate.   [name], MD PhD Stroke Neurology 07/15/2020 6:32 PM       To contact Stroke Continuity provider, please refer to [email or web address] After hours, contact General Neurology

## 2020-07-15 NOTE — Progress Notes (Signed)
ANTICOAGULATION CONSULT NOTE  Pharmacy Consult for heparin Indication: atrial fibrillation  No Known Allergies  Patient Measurements: Height: '5\' 7"'$  (170.2 cm) Weight: (P) 116.8 kg (257 lb 8 oz) IBW/kg (Calculated) : 61.6 Heparin Dosing Weight: 88kg  Vital Signs: Temp: 98.9 F (37.2 C) (02/21 0416) Temp Source: Oral (02/21 0416) BP: 150/65 (02/21 0416) Pulse Rate: 70 (02/21 0416)  Labs: Recent Labs    07/12/20 1141 07/12/20 2247 07/13/20 0740 07/13/20 0940 07/14/20 0502 07/14/20 1039 07/14/20 2042 07/15/20 0604  HGB  --    < > 11.9*  --  11.5*  --   --  11.1*  HCT  --   --  38.1  --  36.5  --   --  34.2*  PLT  --   --  164  --  166  --   --  175  HEPARINUNFRC  --    < > 0.90*   < > 0.34 0.22* 0.86* 0.31  CREATININE 8.13*  --  9.74*  --  11.78*  --   --   --    < > = values in this interval not displayed.    Estimated Creatinine Clearance: 6.7 mL/min (A) (by C-G formula based on SCr of 11.78 mg/dL (H)).   Assessment: 34 YOF presenting as code stroke, MCA infarct, no tPA and TICI3 in IR.  Now with new onset aflutter found, CHA2DS2VASc 6.  Not on anticoagulation PTA.  Heparin level this morning is therapeutic, but at very low end of range.  No overt bleeding or complications noted.  Goal of Therapy:  Heparin level 0.3-0.5 units/ml Monitor platelets by anticoagulation protocol: Yes   Plan:  -Increase IV heparin to 1250 units/hr. -F/u 8 hr HL  -Monitor closely for s/s of bleeding   Juleon Narang A. Levada Dy, PharmD, BCPS, FNKF Clinical Pharmacist [place] Please utilize Amion for appropriate phone number to reach the unit pharmacist ([place])

## 2020-07-15 NOTE — Progress Notes (Signed)
Physical Therapy Treatment Patient Details Name: Rachael Herrera MRN: [id number] DOB: 1961/04/20 Today's Date: 07/15/2020    History of Present Illness The pt is a 60 yo female presenting with right-sided weakenss and gaze preference. 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 Pt s/p thrombectomy 2/17.  PMH includes: DM II, OSA, CKD IV, HTN, HLD, and morbid obesity.    PT Comments    Today's skilled session continued to focus on mobility progression. The pt is making steady progress. Acute PT to continue during pt's hospital stay.    Follow Up Recommendations  CIR     Equipment Recommendations   (defer to post acute)    Recommendations for Other Services Rehab consult     Precautions / Restrictions Precautions Precautions: Fall Restrictions Weight Bearing Restrictions: No    Mobility  Bed Mobility Overal bed mobility: Needs Assistance Bed Mobility: Supine to Sit     Supine to sit: Mod assist;HOB elevated     General bed mobility comments: with HOB 30 degrees: pt able to initiate moving LE's toward/off edge of bed. assist needed to elevate trunk and complete transfer into sitting edge of bed. directional cues/facilitation for technique and use of left UE for transfer. once sitting min/mod assist with cues/use of pads to scoot fully to edge of bed.    Transfers Overall transfer level: Needs assistance Equipment used: Rolling walker (2 wheeled) Transfers: Sit to/from Stand Sit to Stand: Mod assist;From elevated surface Stand pivot transfers: Mod assist       General transfer comment: sit<>stand x2 from elevated bed to/from RW with cues on hand placement and weight shifting. min/mod assist for static standing on 1st rep with dependent for pericare. on second stand performed pivot transfer from bed to chair with cues/facilitation on posture, sequencing and RW negotiation. with with decreased ability to keep right hand on walker  handle. May benefit from hand orthotic, will defer to this OT.  Ambulation/Gait             General Gait Details: deferred this session due to lack of second person for safety    Modified Rankin (Stroke Patients Only) Modified Rankin (Stroke Patients Only) Pre-Morbid Rankin Score: No significant disability Modified Rankin: Moderately severe disability     Cognition Arousal/Alertness: Awake/alert Behavior During Therapy: Impulsive Overall Cognitive Status: Impaired/Different from baseline Area of Impairment: Following commands;Safety/judgement;Awareness                       Following Commands: Follows one step commands inconsistently;Follows one step commands with increased time Safety/Judgement: Decreased awareness of safety;Decreased awareness of deficits Awareness: Intellectual   General Comments: Pt nodding at times or using facial expressions such as smiling or eye roll to communicate through session. Pt eaer to move, cues needed to slow down and wait for full set up prior to mobility.      Exercises General Exercises - Lower Extremity Ankle Circles/Pumps: AAROM;Strengthening;Both;10 reps;Supine Long Arc Quad: AROM;Strengthening;Both;10 reps;Seated Straight Leg Raises: AROM;AAROM;Strengthening;Both;5 reps;Supine Hip Flexion/Marching: AROM;Both;5 reps;Seated     Pertinent Vitals/Pain Pain Assessment: Faces Faces Pain Scale: Hurts a little bit Pain Location: facial grimacing with right UE movements Pain Descriptors / Indicators: Grimacing Pain Intervention(s): Limited activity within patient's tolerance;Monitored during session;Repositioned     PT Goals (current goals can now be found in the care plan section) Acute Rehab PT Goals Patient Stated Goal: none stated due to expressive deficits. pt does state "thank you"  as therapist exiting room PT Goal Formulation: With patient Time For Goal Achievement: 07/26/20 Potential to Achieve Goals:  Good Progress towards PT goals: Progressing toward goals    Frequency    Min 4X/week      PT Plan Current plan remains appropriate    AM-PAC PT "6 Clicks" Mobility   Outcome Measure  Help needed turning from your back to your side while in a flat bed without using bedrails?: A Little Help needed moving from lying on your back to sitting on the side of a flat bed without using bedrails?: A Little Help needed moving to and from a bed to a chair (including a wheelchair)?: A Lot Help needed standing up from a chair using your arms (e.g., wheelchair or bedside chair)?: A Little Help needed to walk in hospital room?: A Lot Help needed climbing 3-5 steps with a railing? : A Lot 6 Click Score: 15    End of Session Equipment Utilized During Treatment: Gait belt Activity Tolerance: Patient tolerated treatment well Patient left: in chair;with call bell/phone within reach Nurse Communication: Mobility status PT Visit Diagnosis: Muscle weakness (generalized) (M62.81);Other abnormalities of gait and mobility (R26.89)     Time: [id number] PT Time Calculation (min) (ACUTE ONLY): 28 min  Charges:  $Therapeutic Exercise: 8-22 mins $Therapeutic Activity: 8-22 mins                    Willow Ora, PTA, [facility] [employer] Office- [phone] 07/15/20, 12:13 PM  Willow Ora 07/15/2020, 12:12 PM

## 2020-07-15 NOTE — Progress Notes (Signed)
Pt's CBG 422. Dr. Curly [place] informed and STAT lab verification requested.

## 2020-07-15 NOTE — Progress Notes (Signed)
Inpatient Diabetes Program Recommendations  AACE/[name]: New Consensus Statement on Inpatient Glycemic Control (2015)  Target Ranges:  Prepandial:   less than 140 mg/dL      Peak postprandial:   less than 180 mg/dL (1-2 hours)      Critically ill patients:  140 - 180 mg/dL   Lab Results  Component Value Date   GLUCAP 216 (H) 07/15/2020   HGBA1C 8.5 (H) 07/12/2020   Results for Rachael, Herrera (MRN [id number]) as of 07/15/2020 09:59  Ref. Range 07/14/2020 12:02 07/14/2020 20:16 07/14/2020 23:59 07/15/2020 04:15 07/15/2020 07:59  Glucose-Capillary Latest Ref Range: 70 - 99 mg/dL 240 (H) 272 (H) 460 (H) 310 (H) 216 (H)   Review of Glycemic Control  Diabetes history: type 2 Outpatient Diabetes medications: Lantus 80 units daily, Novolog 3-13 units sliding scale TID Current orders for Inpatient glycemic control: Novolog SENSITIVE correction scale every 4 hours  Inpatient Diabetes Program Recommendations:   Noted that blood sugars have been greater than 200 mg/dl.  Recommend adding Lantus 20 units daily (weight based=116.8 kg X 0.2 units/kg = 23.36 units) and continue Novolog SENSITIVE correction scale every 4 hours. Titrate dosages as needed.   Harvel Ricks RN BSN CDE Diabetes Coordinator Pager: [phone]  8am-5pm

## 2020-07-15 NOTE — Clinical Note (Incomplete)
1 

## 2020-07-15 NOTE — TOC Initial Note (Signed)
Transition of Care [facility]) - Initial/Assessment Note    Patient Details  Name: Rachael Herrera MRN: [id number] Date of Birth: [birth year]  Transition of Care [facility]) CM/SW Contact:    Pollie Friar, RN Phone Number: 07/15/2020, 11:22 AM  Clinical Narrative:                 Recommendations are for IP. Patient lives in [place]. She currently has aphasia. Cm reached out to her daughter: Rachael Herrera. Rachael Herrera is interested in IR in [place]. CM faxed referral to [facility] and called them with update.  TOC following.  Expected Discharge Plan: IP Rehab Facility Barriers to Discharge: Continued Medical Work up   Patient Goals and CMS Choice   CMS Medicare.gov Compare Post Acute Care list provided to:: Patient Represenative (must comment) Choice offered to / list presented to : Adult Children  Expected Discharge Plan and Services Expected Discharge Plan: [name]   Discharge Planning Services: CM Consult Post Acute Care Choice: IP Rehab Living arrangements for the past 2 months: Single Family Home                                      Prior Living Arrangements/Services Living arrangements for the past 2 months: Single Family Home Lives with:: Adult Children Patient language and need for interpreter reviewed:: Yes Do you feel safe going back to the place where you live?: Yes      Need for Family Participation in Patient Care: Yes (Comment) Care giver support system in place?: Yes (comment)   Criminal Activity/Legal Involvement Pertinent to Current Situation/Hospitalization: No - Comment as needed  Activities of Daily Living      Permission Sought/Granted                  Emotional Assessment           Psych Involvement: No (comment)  Admission diagnosis:  Stroke ([place]) [I63.9] Stroke (cerebrum) ([place]) [I63.9] Cerebrovascular accident (CVA), unspecified mechanism ([place]) [I63.9] Patient Active Problem List   Diagnosis Date Noted  . Cerebral embolism with  cerebral infarction 07/11/2020  . Stroke ([place]) 07/11/2020  . Comprehensive diabetic foot examination, type 2 DM, encounter for ([place]) 09/25/2019  . OSA (obstructive sleep apnea) 11/21/2018  . Acquired hypothyroidism 05/04/2014  . Chronic kidney disease, stage IV (severe) ([place]) 05/04/2014  . Essential hypertension 05/04/2014  . Hyperlipidemia due to type 1 diabetes mellitus ([place]) 05/04/2014  . Morbid obesity ([place]) 05/04/2014   PCP:  No primary care provider on file. Pharmacy:   [facility] [place] [place] - [address] [place] [address] Phone: [phone] Fax: [phone]     Social Determinants of Health ([place]) Interventions    Readmission Risk Interventions No flowsheet data found.

## 2020-07-15 NOTE — Progress Notes (Signed)
Nephrology Follow-Up Consult note     Interval History/Subjective:   no uop charted.  Had HD on 2/20 as missed tx the day before here due to staffing per charting.  UF not charted.  She has word finding difficulty and is not able to volunteer history or review of systems but does not or shake head  Review of systems: limited by aphasia Reports shortness of breath is improved after HD Some nausea ; no vomiting  No cp    Medications:  Current Facility-Administered Medications  Medication Dose Route Frequency Provider Last Rate Last Admin  . 0.9 %  sodium chloride infusion  100 mL Intravenous PRN Reesa Chew, MD      . 0.9 %  sodium chloride infusion  100 mL Intravenous PRN Reesa Chew, MD      . acetaminophen (TYLENOL) tablet 650 mg  650 mg Oral Q4H PRN Bailey-Modzik, Delila A, NP   650 mg at 07/14/20 2200   Or  . acetaminophen (TYLENOL) 160 MG/5ML solution 650 mg  650 mg Per Tube Q4H PRN Bailey-Modzik, Delila A, NP       Or  . acetaminophen (TYLENOL) suppository 650 mg  650 mg Rectal Q4H PRN Bailey-Modzik, Delila A, NP   650 mg at 07/11/20 1723  . amitriptyline (ELAVIL) tablet 25 mg  25 mg Oral QHS Bailey-Modzik, Delila A, NP   25 mg at 07/14/20 2200  . atorvastatin (LIPITOR) tablet 80 mg  80 mg Oral Daily Bailey-Modzik, Delila A, NP   80 mg at 07/15/20 0813  . Chlorhexidine Gluconate Cloth 2 % PADS 6 each  6 each Topical Daily Bailey-Modzik, Delila A, NP   6 each at 07/15/20 1016  . cinacalcet (SENSIPAR) tablet 60 mg  60 mg Oral Q breakfast Bailey-Modzik, Delila A, NP   60 mg at 07/15/20 0814  . feeding supplement (NEPRO CARB STEADY) liquid 237 mL  237 mL Oral TID BM Bailey-Modzik, Delila A, NP   237 mL at 07/15/20 1015  . heparin ADULT infusion 100 units/mL (25000 units/282m)  1,250 Units/hr Intravenous Continuous Pierce, Dwayne A, RPH 12.5 mL/hr at 07/15/20 0812 1,250 Units/hr at 07/15/20 [id number] . heparin injection 1,000 Units  1,000 Units Dialysis PRN PReesa Chew  MD      . insulin aspart (novoLOG) injection 0-9 Units  0-9 Units Subcutaneous Q4H KGreta Doom MD   3 Units at 07/15/20 0900  . lidocaine (PF) (XYLOCAINE) 1 % injection 5 mL  5 mL Intradermal PRN PReesa Chew MD      . lidocaine-prilocaine (EMLA) cream 1 application  1 application Topical PRN PReesa Chew MD      . MEDLINE mouth rinse  15 mL Mouth Rinse BID Bailey-Modzik, Delila A, NP   15 mL at 07/15/20 1016  . multivitamin (RENA-VIT) tablet 1 tablet  1 tablet Oral QHS Bailey-Modzik, Delila A, NP   1 tablet at 07/14/20 2200  . pentafluoroprop-tetrafluoroeth (GEBAUERS) aerosol 1 application  1 application Topical PRN PReesa Chew MD      . senna-docusate (Senokot-S) tablet 1 tablet  1 tablet Oral QHS PRN Bailey-Modzik, Delila A, NP        Physical Exam: Vitals:   07/15/20 0416 07/15/20 0803  BP: (!) 150/65 (!) 152/66  Pulse: 70 62  Resp: 16 18  Temp: 98.9 F (37.2 C) 98.5 F (36.9 C)  SpO2: 96% 95%   Total I/O In: 240 [P.O.:240] Out: -   Intake/Output Summary (Last 24 hours)  at 07/15/2020 1044 Last data filed at 07/15/2020 [id number] Gross per 24 hour  Intake 910.3 ml  Output 0 ml  Net 910.3 ml   General adult female in bed in no acute distress HEENT normocephalic atraumatic Neck supple trachea midline Lungs clear to auscultation bilaterally normal work of breathing at rest  Heart S1S2 no rub Abdomen soft nontender nondistended; obese habitus Extremities no pitting edema  Psych no anxiety or agitation  Neuro aphasia/word finding difficulty - does not speak Access AVF     Test Results I personally reviewed new and old clinical labs and radiology tests Lab Results  Component Value Date   NA 132 (L) 07/15/2020   K 4.5 07/15/2020   CL 95 (L) 07/15/2020   CO2 22 07/15/2020   BUN 36 (H) 07/15/2020   CREATININE 8.63 (H) 07/15/2020   CALCIUM 8.6 (L) 07/15/2020   ALBUMIN 3.6 07/12/2020   PHOS 4.4 07/12/2020    Outpt Records: NEPHROLOGIST: Cay Schillings MD  LOCATION: [facility]  SCHEDULE: T-Th-S 1st Shift  EDW: 273 kg.  KIDNEY: [facility] F200N  LITERS PROC: 73.5 liters/treatment  HD TIME: 210  ACCESS: R IJ PC  NEEDLE SIZE:  ANTICOAG: Standard Heparin Per Protocol  BATH: 3.0CA-HCO3  QB: 350 ml/min  QD: 700 ml/min   Assessment/Recommendations: Rachael Herrera is a/an 60 y.o. female with a past medical history significant for ESRD, admitted for CVA after dialysis.     ESRD: - Plan for HD per TTS schedule while here  Hyperkalemia: Improved with Lokelma and HD  Hypertension/volume: optimize volume with HD  Anemia: hgb acceptable; defer ESA setting of CVA  Secondary hyperparathyroidism:resume home sevelamer. Would continue Sensipar 60 mg daily  DM2: mgmt per primary  CVA: s/p thrombectomy. Mgmt per primary  Claudia Desanctis, MD 07/15/2020 10:56 AM

## 2020-07-15 NOTE — Progress Notes (Signed)
  Speech Language Pathology Treatment: Dysphagia  Patient Details Name: Rachael Herrera MRN: [id number] DOB: Mar 10, 1961 Today's Date: 07/15/2020 Time: [id number] SLP Time Calculation (min) (ACUTE ONLY): 16 min  Assessment / Plan / Recommendation Clinical Impression  Pt observed with cough following initial intake of thin liquids by cup and with upgraded trial by straw. However, no other overt s/s of aspiration was noted with all other PO trials of liquids by cup or with solids. Oral signs of dysphagia were also noted as pt presented with buccal pocketing and residue on the R side as well as an instance of anterior spillage. Despite verbal cues from SLP, pt was unable to clear residue even with liquid wash - pt refused suctioning as well. Recommend DYS 2 diet and thin liquids by cup (No straws) with full supervision for buccal pocking and residue on R side - SLP will continue to follow up.    HPI HPI: 60 yr old experienced difficulty speaking and moving the right side. MRI revelaed acute ischemic infarcts within the left MCA and PCA territories with a small amount of petechial blood products in the left basal  ganglia. Underwent emergent thrombectomy of left MCA M2 occlusion and revascularization. Intubated 2/17 during procedure.      SLP Plan  Continue with current plan of care       Recommendations  Diet recommendations: Dysphagia 2 (fine chop);Thin liquid Liquids provided via: Cup;No straw Medication Administration: Whole meds with puree Supervision: Staff to assist with self feeding;Full supervision/cueing for compensatory strategies Compensations: Minimize environmental distractions;Slow rate;Small sips/bites;Lingual sweep for clearance of pocketing Postural Changes and/or Swallow Maneuvers: Seated upright 90 degrees                Oral Care Recommendations: Oral care BID Follow up Recommendations: Inpatient Rehab SLP Visit Diagnosis: Dysphagia, unspecified (R13.10) Plan: Continue  with current plan of care       GO                Jeanine Luz., SLP Student 07/15/2020, 3:54 PM

## 2020-07-15 NOTE — Plan of Care (Signed)
  Problem: Education: Goal: Knowledge of patient specific risk factors addressed and post discharge goals established will improve Outcome: Progressing   Problem: Education: Goal: Knowledge of secondary prevention will improve 07/15/2020 0237 by Marcos Eke, RN Outcome: Progressing 07/15/2020 0237 by Marcos Eke, RN Outcome: Progressing   Problem: Education: Goal: Knowledge of disease or condition will improve Outcome: Progressing

## 2020-07-15 NOTE — Care Management Important Message (Signed)
Important Message  Patient Details  Name: Rachael Herrera MRN: [id number] Date of Birth: Feb 11, 1961   Medicare Important Message Given:  Yes     Yiannis Tulloch Montine Circle 07/15/2020, 4:38 PM

## 2020-07-15 NOTE — Progress Notes (Signed)
Inpatient Rehab Admissions Coordinator:    I met with Pt. And confirmed that she remains interested in IR at [facility] TOC has sent referral. I will not pursue this Pt. For admit to CIR unless [facility] declines to take her.   Clemens Catholic, [place], [place] Admissions Coordinator  [phone] ([place]) [phone] (office)

## 2020-07-15 NOTE — Progress Notes (Signed)
ANTICOAGULATION CONSULT NOTE - Follow Up Consult  Pharmacy Consult for Heparin Indication: atrial fibrillation and stroke  No Known Allergies  Patient Measurements: Height: '5\' 7"'$  (170.2 cm) Weight: (P) 116.8 kg (257 lb 8 oz) IBW/kg (Calculated) : 61.6 Heparin Dosing Weight: 88 kg  Vital Signs: Temp: 98.3 F (36.8 C) (02/21 1541) Temp Source: Oral (02/21 1541) BP: 134/45 (02/21 1541) Pulse Rate: 52 (02/21 1541)  Labs: Recent Labs    07/13/20 0740 07/13/20 0940 07/14/20 0502 07/14/20 1039 07/14/20 2042 07/15/20 0604 07/15/20 1519  HGB 11.9*  --  11.5*  --   --  11.1*  --   HCT 38.1  --  36.5  --   --  34.2*  --   PLT 164  --  166  --   --  175  --   HEPARINUNFRC 0.90*   < > 0.34   < > 0.86* 0.31 0.25*  CREATININE 9.74*  --  11.78*  --   --  8.63*  --    < > = values in this interval not displayed.   ESRD  Assessment:  58 YOF presented 07/11/20 as code stroke, MCA infarct, no tPA and TICI3 in IR.  Now with new onset aflutter found, CHA2DS2VASc 6.  Not on anticoagulation PTA.  Pharmacy consulted for IV heparin dosing on 07/12/20. Targeting low therapeutic heparin levels and no boluses per stroke protocol.    Heparin level this afternoon is subtherapeutic (0.25) on 1250 units/hr.  Level this morning was low therapeutic (0.31) on 1200 units/hr.  RN reports no infusion problems, no bleeding.  Goal of Therapy:  Heparin level 0.3-0.5 units/ml Monitor platelets by anticoagulation protocol: Yes   Plan:   Increase heparin drip to 1350 units/hr.  Next heparin level ~8 hrs after rate change.  Daily heparin level and CBC while on heparin.  Monitor for signs/symptoms of bleeding.  Follow up for transition to oral anticoagulation when able.    Arty Baumgartner, [place] 07/15/2020,5:21 PM

## 2020-07-16 LAB — CBC
HCT: 31.5 % — ABNORMAL LOW (ref 36.0–46.0)
Hemoglobin: 10 g/dL — ABNORMAL LOW (ref 12.0–15.0)
MCH: 30.9 pg (ref 26.0–34.0)
MCHC: 31.7 g/dL (ref 30.0–36.0)
MCV: 97.2 fL (ref 80.0–100.0)
Platelets: 173 10*3/uL (ref 150–400)
RBC: 3.24 MIL/uL — ABNORMAL LOW (ref 3.87–5.11)
RDW: 15.8 % — ABNORMAL HIGH (ref 11.5–15.5)
WBC: 6.8 10*3/uL (ref 4.0–10.5)
nRBC: 0 % (ref 0.0–0.2)

## 2020-07-16 LAB — GLUCOSE, CAPILLARY
Glucose-Capillary: 139 mg/dL — ABNORMAL HIGH (ref 70–99)
Glucose-Capillary: 244 mg/dL — ABNORMAL HIGH (ref 70–99)
Glucose-Capillary: 282 mg/dL — ABNORMAL HIGH (ref 70–99)
Glucose-Capillary: 347 mg/dL — ABNORMAL HIGH (ref 70–99)
Glucose-Capillary: 352 mg/dL — ABNORMAL HIGH (ref 70–99)

## 2020-07-16 LAB — BASIC METABOLIC PANEL
Anion gap: 18 — ABNORMAL HIGH (ref 5–15)
BUN: 48 mg/dL — ABNORMAL HIGH (ref 8–23)
CO2: 21 mmol/L — ABNORMAL LOW (ref 22–32)
Calcium: 8.3 mg/dL — ABNORMAL LOW (ref 8.9–10.3)
Chloride: 93 mmol/L — ABNORMAL LOW (ref 98–111)
Creatinine, Ser: 10.58 mg/dL — ABNORMAL HIGH (ref 0.44–1.00)
GFR, Estimated: 4 mL/min — ABNORMAL LOW (ref >60)
Glucose, Bld: 245 mg/dL — ABNORMAL HIGH (ref 70–99)
Potassium: 4.3 mmol/L (ref 3.5–5.1)
Sodium: 132 mmol/L — ABNORMAL LOW (ref 135–145)

## 2020-07-16 LAB — HEPARIN LEVEL (UNFRACTIONATED): Heparin Unfractionated: 0.36 IU/mL (ref 0.30–0.70)

## 2020-07-16 MED ORDER — APIXABAN 5 MG PO TABS
5.0000 mg | ORAL_TABLET | Freq: Two times a day (BID) | ORAL | Status: DC
Start: 2020-07-16 — End: 2020-07-18
  Administered 2020-07-16 – 2020-07-18 (×4): 5 mg via ORAL
  Filled 2020-07-16 (×4): qty 1

## 2020-07-16 MED ORDER — LEVOTHYROXINE SODIUM 75 MCG PO TABS
75.0000 ug | ORAL_TABLET | Freq: Every day | ORAL | Status: DC
  Administered 2020-07-16 – 2020-07-18 (×3): 75 ug via ORAL
  Filled 2020-07-16 (×3): qty 1

## 2020-07-16 NOTE — Plan of Care (Signed)
°  Problem: Education: Goal: Knowledge of disease or condition will improve Outcome: Progressing Goal: Knowledge of secondary prevention will improve Outcome: Progressing Goal: Knowledge of patient specific risk factors addressed and post discharge goals established will improve Outcome: Progressing   Problem: Coping: Goal: Will verbalize positive feelings about self Outcome: Progressing Goal: Will identify appropriate support needs Outcome: Progressing   Problem: Health Behavior/Discharge Planning: Goal: Ability to manage health-related needs will improve Outcome: Progressing   Problem: Self-Care: Goal: Ability to participate in self-care as condition permits will improve Outcome: Progressing Goal: Verbalization of feelings and concerns over difficulty with self-care will improve Outcome: Progressing Goal: Ability to communicate needs accurately will improve Outcome: Progressing   Problem: Nutrition: Goal: Risk of aspiration will decrease Outcome: Progressing Goal: Dietary intake will improve Outcome: Progressing   Problem: Ischemic Stroke/TIA Tissue Perfusion: Goal: Complications of ischemic stroke/TIA will be minimized Outcome: Progressing

## 2020-07-16 NOTE — Progress Notes (Signed)
ANTICOAGULATION CONSULT NOTE - Follow Up Consult  Pharmacy Consult for Heparin > Eliquis Indication: atrial fibrillation and stroke  No Known Allergies  Patient Measurements: Height: '5\' 7"'$  (170.2 cm) Weight: 121 kg (266 lb 12.1 oz) IBW/kg (Calculated) : 61.6 Heparin Dosing Weight: 88 kg  Vital Signs: Temp: 98.6 F (37 C) (02/22 1130) Temp Source: Oral (02/22 1130) BP: 148/66 (02/22 1130) Pulse Rate: 50 (02/22 1130)  Labs: Recent Labs    07/14/20 0502 07/14/20 1039 07/15/20 0604 07/15/20 1519 07/15/20 2240 07/16/20 0351  HGB 11.5*  --  11.1*  --   --  10.0*  HCT 36.5  --  34.2*  --   --  31.5*  PLT 166  --  175  --   --  173  HEPARINUNFRC 0.34   < > 0.31 0.25*  --  0.36  CREATININE 11.78*  --  8.63*  --  9.67* 10.58*   < > = values in this interval not displayed.   ESRD  Assessment:  7 YOF presented 07/11/20 as code stroke, MCA infarct, no tPA and TICI3 in IR.  Now with new onset aflutter found, CHA2DS2VASc 6.  Not on anticoagulation PTA.  Pharmacy consulted for IV heparin dosing on 07/12/20. Targeting low therapeutic heparin levels and no boluses per stroke protocol.    Heparin level therapeutic this morning (0.36) on 1250 units/hr.     To transition to Eliquis.  Goal of Therapy:  Heparin level 0.3-0.5 units/ml appropriate Eliquis regimen for indication Monitor platelets by anticoagulation protocol: Yes   Plan:   Eliquis 5 mg PO BID to begin at 6pm tonight.  Stop IV heparin when giving first Eliquis dose.  Monitor for signs/symptoms of bleeding.   Arty Baumgartner, RPh 07/16/2020,2:49 PM

## 2020-07-16 NOTE — TOC Progression Note (Signed)
Transition of Care [facility]) - Progression Note    Patient Details  Name: Rachael Herrera MRN: [id number] Date of Birth: [birth year]  [facility] CM/SW Contact  Pollie Friar, RN Phone Number: 07/16/2020, 10:51 AM  Clinical Narrative:    CM spoke to [facility] at [facility] and they are offering the patient a bed for rehab. CM has faxed them information to begin insurance auth.  TOC following.   Expected Discharge Plan: IP Rehab Facility Barriers to Discharge: Continued Medical Work up  Expected Discharge Plan and Services Expected Discharge Plan: [place]   Discharge Planning Services: CM Consult Post Acute Care Choice: IP Rehab Living arrangements for the past 2 months: Single Family Home                                       Social Determinants of Health (SDOH) Interventions    Readmission Risk Interventions No flowsheet data found.

## 2020-07-16 NOTE — Progress Notes (Signed)
Inpatient Rehab Admissions Coordinator:   [facility] is offering Pt. A bed pending insurance auth. CIR to s/o.   Clemens Catholic, [place], [place] Admissions Coordinator  [phone] ([place]) [phone] (office)

## 2020-07-16 NOTE — Progress Notes (Signed)
ANTICOAGULATION CONSULT NOTE - Follow Up Consult  Pharmacy Consult for Heparin Indication: atrial fibrillation and stroke  Labs: Recent Labs    07/14/20 0502 07/14/20 1039 07/15/20 0604 07/15/20 1519 07/15/20 2240 07/16/20 0351  HGB 11.5*  --  11.1*  --   --  10.0*  HCT 36.5  --  34.2*  --   --  31.5*  PLT 166  --  175  --   --  173  HEPARINUNFRC 0.34   < > 0.31 0.25*  --  0.36  CREATININE 11.78*  --  8.63*  --  9.67*  --    < > = values in this interval not displayed.   ESRD  Assessment:  70 YOF presented 07/11/20 as code stroke, MCA infarct, no tPA and TICI3 in IR.  Now with new onset aflutter found, CHA2DS2VASc 6.  Not on anticoagulation PTA.  Pharmacy consulted for IV heparin dosing on 07/12/20. Targeting low therapeutic heparin levels and no boluses per stroke protocol.    Heparin level 0.36 units/ml  Goal of Therapy:  Heparin level 0.3-0.5 units/ml Monitor platelets by anticoagulation protocol: Yes   Plan:   Continue heparin drip at 1350 units/hr.  Daily heparin level and CBC while on heparin.  Monitor for signs/symptoms of bleeding.  Follow up for transition to oral anticoagulation when able.   Thanks for allowing pharmacy to be a part of this patient's care.  Excell Seltzer, PharmD Clinical Pharmacist 07/16/2020,4:52 AM

## 2020-07-16 NOTE — Progress Notes (Signed)
Nephrology Follow-Up Consult note     Interval History/Subjective:  Seen and examined on dialysis. Procedure supervised. Tolerating goal.  151/67 left AVF in use.  Per nursing was given 2 liters oxygen as dropped to 89 with sleep. Now 100%.   Review of systems: limited by aphasia denies shortness of breath  No n/v No cp    Medications:  Current Facility-Administered Medications  Medication Dose Route Frequency Provider Last Rate Last Admin  . 0.9 %  sodium chloride infusion  100 mL Intravenous PRN Reesa Chew, MD      . 0.9 %  sodium chloride infusion  100 mL Intravenous PRN Reesa Chew, MD      . acetaminophen (TYLENOL) tablet 650 mg  650 mg Oral Q4H PRN Bailey-Modzik, Delila A, NP   650 mg at 07/14/20 2200   Or  . acetaminophen (TYLENOL) 160 MG/5ML solution 650 mg  650 mg Per Tube Q4H PRN Bailey-Modzik, Delila A, NP       Or  . acetaminophen (TYLENOL) suppository 650 mg  650 mg Rectal Q4H PRN Bailey-Modzik, Delila A, NP   650 mg at 07/11/20 1723  . amitriptyline (ELAVIL) tablet 25 mg  25 mg Oral QHS Bailey-Modzik, Delila A, NP   25 mg at 07/15/20 2217  . atorvastatin (LIPITOR) tablet 80 mg  80 mg Oral Daily Bailey-Modzik, Delila A, NP   80 mg at 07/15/20 0813  . Chlorhexidine Gluconate Cloth 2 % PADS 6 each  6 each Topical Daily Bailey-Modzik, Delila A, NP   6 each at 07/15/20 1016  . Chlorhexidine Gluconate Cloth 2 % PADS 6 each  6 each Topical Q0600 Claudia Desanctis, MD   6 each at 07/16/20 [id number]  . cinacalcet (SENSIPAR) tablet 60 mg  60 mg Oral Q breakfast Bailey-Modzik, Delila A, NP   60 mg at 07/15/20 0814  . feeding supplement (NEPRO CARB STEADY) liquid 237 mL  237 mL Oral TID BM Bailey-Modzik, Delila A, NP   237 mL at 07/15/20 2043  . heparin ADULT infusion 100 units/mL (25000 units/211m)  1,350 Units/hr Intravenous Continuous ESkeet Simmer RPH 13.5 mL/hr at 07/15/20 1907 1,350 Units/hr at 07/15/20 1907  . heparin injection 1,000 Units  1,000 Units Dialysis PRN  PReesa Chew MD      . insulin aspart (novoLOG) injection 0-9 Units  0-9 Units Subcutaneous Q4H KGreta Doom MD   3 Units at 07/16/20 [phone] . insulin glargine (LANTUS) injection 20 Units  20 Units Subcutaneous Daily XRosalin Hawking MD   20 Units at 07/15/20 1841  . lidocaine (PF) (XYLOCAINE) 1 % injection 5 mL  5 mL Intradermal PRN PReesa Chew MD      . lidocaine-prilocaine (EMLA) cream 1 application  1 application Topical PRN PReesa Chew MD      . MEDLINE mouth rinse  15 mL Mouth Rinse BID Bailey-Modzik, Delila A, NP   15 mL at 07/15/20 2217  . multivitamin (RENA-VIT) tablet 1 tablet  1 tablet Oral QHS Bailey-Modzik, Delila A, NP   1 tablet at 07/15/20 2217  . pentafluoroprop-tetrafluoroeth (GEBAUERS) aerosol 1 application  1 application Topical PRN PReesa Chew MD      . senna-docusate (Senokot-S) tablet 1 tablet  1 tablet Oral QHS PRN Bailey-Modzik, Delila A, NP      . sevelamer carbonate (RENVELA) tablet 800 mg  800 mg Oral TID WC FClaudia Desanctis MD   800 mg at 07/15/20 1847    Physical Exam:  Vitals:   07/16/20 0830 07/16/20 0900  BP: (!) 143/64 (!) 151/67  Pulse: (!) 47 (!) 42  Resp: 16 15  Temp:    SpO2: 95% 99%   No intake/output data recorded.  Intake/Output Summary (Last 24 hours) at 07/16/2020 [id number] Last data filed at 07/16/2020 0356 Gross per 24 hour  Intake 279.37 ml  Output --  Net 279.37 ml   General adult female in bed in no acute distress HEENT normocephalic atraumatic Neck supple trachea midline Lungs clear to auscultation bilaterally normal work of breathing at rest  Heart S1S2 no rub Abdomen soft nontender nondistended; obese habitus Extremities no pitting edema  Psych no anxiety or agitation  Neuro aphasia/word finding difficulty - does not speak Access AVF LUE in use    Test Results I personally reviewed new and old clinical labs and radiology tests Lab Results  Component Value Date   NA 132 (L) 07/16/2020   K 4.3  07/16/2020   CL 93 (L) 07/16/2020   CO2 21 (L) 07/16/2020   BUN 48 (H) 07/16/2020   CREATININE 10.58 (H) 07/16/2020   CALCIUM 8.3 (L) 07/16/2020   ALBUMIN 3.6 07/12/2020   PHOS 4.4 07/12/2020    Outpt Records: NEPHROLOGIST: Cay Schillings MD  LOCATION: [facility]  SCHEDULE: T-Th-S 1st Shift  EDW: 273 kg.  KIDNEY: [facility] F200N  LITERS PROC: 73.5 liters/treatment  HD TIME: 210  ACCESS: R IJ PC  NEEDLE SIZE:  ANTICOAG: Standard Heparin Per Protocol  BATH: 3.0CA-HCO3  QB: 350 ml/min  QD: 700 ml/min   Assessment/Recommendations: Rachael Herrera is a/an 60 y.o. female with a past medical history significant for ESRD, admitted for CVA after dialysis.     ESRD: - Plan for HD per TTS schedule while here  Hyperkalemia: resolved with Lokelma and HD  Hypertension/volume: optimize volume with HD; defer to primary with setting of CVA  Anemia: hgb acceptable; defer ESA setting of CVA  Secondary hyperparathyroidism:resume home sevelamer. Would continue Sensipar 60 mg daily  DM2: mgmt per primary  CVA: s/p thrombectomy. Mgmt per primary  Claudia Desanctis, MD 07/16/2020 9:20 AM

## 2020-07-16 NOTE — Progress Notes (Signed)
PT Cancellation Note  Patient Details Name: Rachael Herrera MRN: [id number] DOB: Oct 22, 1960   Cancelled Treatment:    Reason Eval/Treat Not Completed: Patient at procedure or test/unavailable Pt off floor at HD. Will follow.   [name] 07/16/2020, 8:36 AM Marisa Severin, PT, DPT Acute Rehabilitation Services Pager [phone] Office [phone]

## 2020-07-16 NOTE — Progress Notes (Signed)
STROKE TEAM PROGRESS NOTE   INTERVAL HISTORY No acute events. Pt lying in bed, more awake alert and interactive. However, still global aphasia, only able to close eyes open eyes at request. Not able to follow other commands but able to mimic. Will switch heparin IV to eliquis. Pending [facility] placement.   Vitals:   07/16/20 1130 07/16/20 1559 07/16/20 2027 07/16/20 2030  BP: (!) 148/66 (!) 149/57 (!) 140/56   Pulse: (!) 50 (!) 45 (!) 46 (!) 51  Resp: 18  18   Temp: 98.6 F (37 C)  98.6 F (37 C)   TempSrc: Oral  Oral   SpO2: 91% 100% 97%   Weight:      Height:       CBC:  Recent Labs  Lab 07/11/20 1350 07/11/20 1446 07/15/20 0604 07/16/20 0351  WBC 6.4   < > 6.4 6.8  NEUTROABS 3.3  --   --   --   HGB 12.6   < > 11.1* 10.0*  HCT 40.9   < > 34.2* 31.5*  MCV 99.3   < > 95.8 97.2  PLT 179   < > 175 173   < > = values in this interval not displayed.   Basic Metabolic Panel:  Recent Labs  Lab 07/11/20 2019 07/12/20 1141 07/13/20 0740 07/15/20 2240 07/16/20 0351  NA 130* 134*   < > 129* 132*  K 6.0* 5.2*   < > 4.7 4.3  CL 97* 98   < > 92* 93*  CO2 18* 23   < > 22 21*  GLUCOSE 488* 231*   < > 420* 245*  BUN 27* 30*   < > 47* 48*  CREATININE 6.32* 8.13*   < > 9.67* 10.58*  CALCIUM 8.0* 8.4*   < > 8.2* 8.3*  MG  --  1.9  --   --   --   PHOS 3.7 4.4  --   --   --    < > = values in this interval not displayed.   Lipid Panel:  Recent Labs  Lab 07/12/20 0229  CHOL 156  TRIG 382*  HDL 31*  CHOLHDL 5.0  VLDL 76*  LDLCALC 49   HgbA1c:  Recent Labs  Lab 07/12/20 0229  HGBA1C 8.5*   Urine Drug Screen: No results for input(s): LABOPIA, COCAINSCRNUR, LABBENZ, AMPHETMU, THCU, LABBARB in the last 168 hours.  Alcohol Level No results for input(s): ETH in the last 168 hours.  IMAGING past 24 hours No results found. PHYSICAL EXAM Constitutional: Obese middle-aged female sitting up in bed in NAD.  Psych: Affect appropriate to situation Eyes: No scleral  injection HENT: No OP obstrucion MSK: no joint deformities.  CV: Irregular rate and rhthym Respiratory: no extra work of breathing  GI: Soft.  No distension. There is no tenderness.  Skin: WDI  Neuro: Awake alert, eyes open, smile to provider, however, global aphasia, nonverbal, but able to close eyes and open mouth, not following other commands. Right facial droop.Able to hold BUEs without drift, but right UE pronator drift and decreased finger grip. BLE proximal and distal 3/5. Sensation, coordination not cooperative and gait not tested.   ASSESSMENT/PLAN Rachael Herrera is a 60 y.o. female with PMH significant for ESRD, HTN, OSA on CPAP, HLD, DM2 with peripheral neuropathy, remote seizure d/t hypoglycemia, morbid obesity, BLE claudication, tachybrady syndrome with pacemaker, NSVT, anemia, Vitamin B12 deficiency, MRSA PNA, chronic bronchitis, hypothyroidism, and  bilat ulnar neuropathy who was LKW  at dialysis 2/17. She drove herself to dialysis and then back home again. Soon thereafter, she was noted to have right sided weakness with gaze preference and inability to communicate. EMS was called and she was brought in as a code stroke. BP in the 170s, glucose wnl.   Stroke - Acute ischemic infarcts within the left MCA and PCA territories with a small amount of petechial blood products in the left basal ganglia d/t L MCA M2 occlusion s/p successful thrombectomy etiology likely cardioembolic from atrial flutter/fibrillation  CT showed left occipital and left caudate infarct.    CTA head and neck left M1/M2 thrombus, left M3 occlusion.    IR performed with left M2 TICI3 reperfusion.    MRI showed left MCA and PCA infarct with BG petechial hemorrhage.    2D Echo EF 55-60%, No shunt   LDL 49  HgbA1c 8.5  No antithrombotics PTA, now switch Heparin drip to eliquis   Therapy recommendations:  CIR  Disposition:  Pending [facility]   Atrial flutter, new diagnosis  Known hx of some  degree of PVCs, NSVT, and bradycardia with pacemaker and recent history of aborted replacement attempt. Also declined admission and  MICRA implant  2/15 at [facility].  EP consult called for possible aflutter. Appreciate recommendations.   Heparin drip initiated 2/18 evening, switch to Eliquis today   No bradycardia episodes seen  Continue cardiac monitoring   Hypertension  Home meds:  Norvasc '10mg'$  daily, losartan '50mg'$  daily demadex '20mg'$  daily.    BP stable  Maintain strict blood pressure control (nephrology following) . Long-term BP goal normotensive   Hyperlipidemia  Home meds:  80 mg Lipitor daily   LDL 49 at goal < 70  On lipitor 80 now  Continue statin at discharge  Diabetes type II Uncontrolled  Home meds:  Lantus 80 units at bedtime   HgbA1c 8.5, goal < 7.0  CBGs with labile blood glucose with hypoglycemic episodes x 2 treated with D50 on 2/18 now improved -> hyperglycemia  DM coordinator on board - on lantus 20 daily   SSI   ESRD on HD  Nephrology consulted and following. Appreciate assistance.  Hyperkalemia: HD today  Daily renal panel   Daily weights, Daily Renal Panel, Strict I/Os, Avoid nephrotoxins (NSAIDs, judicious IV Contrast)  Other Stroke Risk Factors  Former Cigarette smoker x 27 years, quit 2019  Obesity, Body mass index is 41.78 kg/m., BMI >/= 30 associated with increased stroke risk, recommend weight loss, diet and exercise as appropriate   Family hx stroke (Sister)  Obstructive sleep apnea, on CPAP at home  Other Active Problems:    Hospital day # 5   [name], MD PhD Stroke Neurology 07/16/2020 9:03 PM       To contact Stroke Continuity provider, please refer to [email or web address] After hours, contact General Neurology

## 2020-07-17 ENCOUNTER — Encounter (HOSPITAL_COMMUNITY): Payer: Self-pay

## 2020-07-17 DIAGNOSIS — R001 Bradycardia, unspecified: Secondary | ICD-10-CM

## 2020-07-17 LAB — BASIC METABOLIC PANEL WITH GFR
Anion gap: 17 — ABNORMAL HIGH (ref 5–15)
BUN: 30 mg/dL — ABNORMAL HIGH (ref 8–23)
CO2: 24 mmol/L (ref 22–32)
Calcium: 8.6 mg/dL — ABNORMAL LOW (ref 8.9–10.3)
Chloride: 93 mmol/L — ABNORMAL LOW (ref 98–111)
Creatinine, Ser: 7.96 mg/dL — ABNORMAL HIGH (ref 0.44–1.00)
GFR, Estimated: 5 mL/min — ABNORMAL LOW
Glucose, Bld: 195 mg/dL — ABNORMAL HIGH (ref 70–99)
Potassium: 4.3 mmol/L (ref 3.5–5.1)
Sodium: 134 mmol/L — ABNORMAL LOW (ref 135–145)

## 2020-07-17 LAB — GLUCOSE, CAPILLARY
Glucose-Capillary: 197 mg/dL — ABNORMAL HIGH (ref 70–99)
Glucose-Capillary: 235 mg/dL — ABNORMAL HIGH (ref 70–99)
Glucose-Capillary: 236 mg/dL — ABNORMAL HIGH (ref 70–99)
Glucose-Capillary: 264 mg/dL — ABNORMAL HIGH (ref 70–99)
Glucose-Capillary: 351 mg/dL — ABNORMAL HIGH (ref 70–99)
Glucose-Capillary: 344 mg/dL — ABNORMAL HIGH (ref 70–99)

## 2020-07-17 LAB — CBC
HCT: 33.3 % — ABNORMAL LOW (ref 36.0–46.0)
Hemoglobin: 10.9 g/dL — ABNORMAL LOW (ref 12.0–15.0)
MCH: 31.4 pg (ref 26.0–34.0)
MCHC: 32.7 g/dL (ref 30.0–36.0)
MCV: 96 fL (ref 80.0–100.0)
Platelets: 184 10*3/uL (ref 150–400)
RBC: 3.47 MIL/uL — ABNORMAL LOW (ref 3.87–5.11)
RDW: 15.7 % — ABNORMAL HIGH (ref 11.5–15.5)
WBC: 6.3 10*3/uL (ref 4.0–10.5)
nRBC: 0 % (ref 0.0–0.2)

## 2020-07-17 MED ORDER — INSULIN ASPART 100 UNIT/ML ~~LOC~~ SOLN
0.0000 [IU] | Freq: Every day | SUBCUTANEOUS | Status: DC
  Administered 2020-07-17: 4 [IU] via SUBCUTANEOUS

## 2020-07-17 MED ORDER — INSULIN GLARGINE 100 UNIT/ML ~~LOC~~ SOLN
24.0000 [IU] | Freq: Every day | SUBCUTANEOUS | Status: DC
  Filled 2020-07-17: qty 0.24

## 2020-07-17 MED ORDER — AMLODIPINE BESYLATE 10 MG PO TABS
10.0000 mg | ORAL_TABLET | Freq: Every day | ORAL | Status: DC
Start: 2020-07-17 — End: 2020-07-18
  Administered 2020-07-17 – 2020-07-18 (×2): 10 mg via ORAL
  Filled 2020-07-17 (×2): qty 1

## 2020-07-17 MED ORDER — LOSARTAN POTASSIUM 50 MG PO TABS
50.0000 mg | ORAL_TABLET | Freq: Every day | ORAL | Status: DC
  Administered 2020-07-17 – 2020-07-18 (×2): 50 mg via ORAL
  Filled 2020-07-17 (×2): qty 1

## 2020-07-17 MED ORDER — INSULIN GLARGINE 100 UNIT/ML ~~LOC~~ SOLN
15.0000 [IU] | Freq: Two times a day (BID) | SUBCUTANEOUS | Status: DC
  Administered 2020-07-17 – 2020-07-18 (×2): 15 [IU] via SUBCUTANEOUS
  Filled 2020-07-17 (×3): qty 0.15

## 2020-07-17 MED ORDER — INSULIN ASPART 100 UNIT/ML ~~LOC~~ SOLN
0.0000 [IU] | Freq: Three times a day (TID) | SUBCUTANEOUS | Status: DC
  Administered 2020-07-18: 2 [IU] via SUBCUTANEOUS
  Administered 2020-07-18: 3 [IU] via SUBCUTANEOUS

## 2020-07-17 MED ORDER — INSULIN GLARGINE 100 UNIT/ML ~~LOC~~ SOLN
15.0000 [IU] | Freq: Two times a day (BID) | SUBCUTANEOUS | Status: DC
  Filled 2020-07-17: qty 0.15

## 2020-07-17 MED ORDER — CHLORHEXIDINE GLUCONATE CLOTH 2 % EX PADS
6.0000 | MEDICATED_PAD | Freq: Every day | CUTANEOUS | Status: DC
  Administered 2020-07-18: 6 via TOPICAL

## 2020-07-17 MED ORDER — NYSTATIN 100000 UNIT/ML MT SUSP
5.0000 mL | Freq: Four times a day (QID) | OROMUCOSAL | Status: DC
  Administered 2020-07-17 – 2020-07-18 (×3): 500000 [IU] via ORAL
  Filled 2020-07-17 (×3): qty 5

## 2020-07-17 NOTE — Progress Notes (Signed)
OT Cancellation Note  Patient Details Name: Rachael Herrera MRN: [id number] DOB: [birth year]   Cancelled Treatment:    Reason Eval/Treat Not Completed: Fatigue/lethargy limiting ability to participate;Other (comment)  Pt asleep upon arrival, able to arouse however pt very lethargic unable to follow commands and shakes head "no" when asked if pt would participate in session. Will check back as time allows for OT session.  Corinne Ports K., COTA/L Acute Rehabilitation Services [phone]  [name] 07/17/2020, 2:14 PM

## 2020-07-17 NOTE — Progress Notes (Signed)
Physical Therapy Treatment Patient Details Name: Rachael Herrera MRN: [id number] DOB: Jul 10, 1960 Today's Date: 07/17/2020    History of Present Illness The pt is a 60 yo female presenting with right-sided weakenss and gaze preference. 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 Pt s/p thrombectomy 2/17.  PMH includes: DM II, OSA, CKD IV, HTN, HLD, and morbid obesity.    PT Comments    Pt continues to demonstrate global aphasia but did attempt to verbalize today with her able to successfully state a few words in regards to not wanting to do some tasks during the session. Pt also needs repeated simple cues to follow directions. Pt eventually did agree to participate and made significant progress with functional mobility, only needing minAx1-2 for safety with transfers and short bedroom gait distances of ~25 ft with a RW due to her impulsive tendencies and poor safety awareness. Pt requires R hand-over-hand assistance to attend to it for proper placement on the RW and on chair arm rests for gait and transfers. Will continue to follow acutely. Current recommendations remain appropriate.   Follow Up Recommendations  CIR     Equipment Recommendations   (defer to post acute)    Recommendations for Other Services Rehab consult     Precautions / Restrictions Precautions Precautions: Fall Restrictions Weight Bearing Restrictions: No    Mobility  Bed Mobility Overal bed mobility: Needs Assistance Bed Mobility: Supine to Sit     Supine to sit: HOB elevated;Min assist     General bed mobility comments: HOB elevated, pt initiating moving legs towards EOB but needing minA for the trunk with HHA pt pulling on PT's hand to sit up EOB.    Transfers Overall transfer level: Needs assistance Equipment used: Rolling walker (2 wheeled) Transfers: Sit to/from Stand Sit to Stand: From elevated surface;Min assist Stand pivot transfers: +2  safety/equipment;Min assist       General transfer comment: Sit to stand 1x from EOB and 4x from recliner with minA for stability and cueing hand-over-hand for pt to use R hand to push up to stand and reach back to sit. MinAx2 for safety with stand step to R.  Ambulation/Gait Ambulation/Gait assistance: Min assist;+2 safety/equipment Gait Distance (Feet): 25 Feet Assistive device: Rolling walker (2 wheeled) Gait Pattern/deviations: Step-through pattern;Decreased step length - right;Decreased stride length Gait velocity: decreased Gait velocity interpretation: <1.8 ft/sec, indicate of risk for recurrent falls General Gait Details: Pt ambulates slowly with some unsteadiness, especially when stepping posteriorly, needing minAx2 for safety. She tends to push RW distal anteriorly and needs physical A to keep it proximal. Hand-over-hand directing of pt to attend to R hand for proper placement on RW.   Stairs             Wheelchair Mobility    Modified Rankin (Stroke Patients Only) Modified Rankin (Stroke Patients Only) Pre-Morbid Rankin Score: No significant disability Modified Rankin: Moderately severe disability     Balance Overall balance assessment: Needs assistance Sitting-balance support: Single extremity supported;Feet supported Sitting balance-Leahy Scale: Fair     Standing balance support: Bilateral upper extremity supported;During functional activity Standing balance-Leahy Scale: Poor Standing balance comment: External assistance and UE support on RW.                            Cognition Arousal/Alertness: Awake/alert Behavior During Therapy: Impulsive Overall Cognitive Status: Impaired/Different from baseline Area of Impairment: Following commands;Safety/judgement;Awareness  Following Commands: Follows one step commands inconsistently;Follows one step commands with increased time Safety/Judgement: Decreased awareness of  safety;Decreased awareness of deficits Awareness: Emergent   General Comments: Pt impulsive in starting transitions prior to being cued at times. Needs repeated simple commands to direct pt to attend to R side and maintain safety.      Exercises Other Exercises Other Exercises: Sit to stand 4x from recliner with hand-over-hand of R hand on arm rest to push up and reach back to sit.    General Comments        Pertinent Vitals/Pain Pain Assessment: Faces Faces Pain Scale: Hurts a little bit Pain Location: generalized movement with grimacing Pain Descriptors / Indicators: Grimacing Pain Intervention(s): Limited activity within patient's tolerance;Monitored during session;Repositioned    Home Living                      Prior Function            PT Goals (current goals can now be found in the care plan section) Acute Rehab PT Goals Patient Stated Goal: to not continue therapuetic exercises after sit to stand reps PT Goal Formulation: With patient Time For Goal Achievement: 07/26/20 Potential to Achieve Goals: Good Progress towards PT goals: Progressing toward goals    Frequency    Min 4X/week      PT Plan Current plan remains appropriate    Co-evaluation              AM-PAC PT "6 Clicks" Mobility   Outcome Measure  Help needed turning from your back to your side while in a flat bed without using bedrails?: A Little Help needed moving from lying on your back to sitting on the side of a flat bed without using bedrails?: A Little Help needed moving to and from a bed to a chair (including a wheelchair)?: A Little Help needed standing up from a chair using your arms (e.g., wheelchair or bedside chair)?: A Little Help needed to walk in hospital room?: A Little Help needed climbing 3-5 steps with a railing? : A Lot 6 Click Score: 17    End of Session Equipment Utilized During Treatment: Gait belt Activity Tolerance: Patient tolerated treatment  well Patient left: in chair;with call bell/phone within reach;with chair alarm set Nurse Communication: Mobility status PT Visit Diagnosis: Muscle weakness (generalized) (M62.81);Other abnormalities of gait and mobility (R26.89);Unsteadiness on feet (R26.81);Difficulty in walking, not elsewhere classified (R26.2)     Time: [id number] PT Time Calculation (min) (ACUTE ONLY): 20 min  Charges:  $Therapeutic Activity: 8-22 mins                     Moishe Spice, PT, DPT Acute Rehabilitation Services  Pager: [phone] Office: [place] 07/17/2020, 12:42 PM

## 2020-07-17 NOTE — TOC Progression Note (Signed)
Transition of Care [facility]) - Progression Note    Patient Details  Name: Rachael Herrera MRN: [id number] Date of Birth: 1960/11/20  Transition [facility]) CM/SW Contact  Pollie Friar, RN Phone Number: 07/17/2020, 10:50 AM  Clinical Narrative:    [facility] received insurance auth for admission. They have concerns about taking patient without a pacer placement and would like cardiology to resee the patient. CM has updated the MD.  Pts daughter also interested in having our IR reassess if they will have a bed sooner than [facility]. CIR also would like cards to resee the patient. TOC following.   Expected Discharge Plan: IP Rehab Facility Barriers to Discharge: Continued Medical Work up  Expected Discharge Plan and Services Expected Discharge Plan: [place]   Discharge Planning Services: CM Consult Post Acute Care Choice: IP Rehab Living arrangements for the past 2 months: Single Family Home                                       Social Determinants of Health (SDOH) Interventions    Readmission Risk Interventions No flowsheet data found.

## 2020-07-17 NOTE — Progress Notes (Signed)
  Asked to revaluate patient prior to CIR.   Reviewed course, plans, notes, tele, and previous EP consult with Dr. Curt Bears.   Currently, pt appears to be in a fib/flutter with a narrow ventricular rhythm in the 40-50s.   She has been able to tolerate PT as recently as 07/15/20 per notes. (PT deferred yesterday with HD).  Pt has had a R sided attempt at pacemaker implant fail (unable to access Subclavian or right septic axillary veins), and has a left sided AV fistula. Her only option for pacing is a Micra AV Leadless Pacemaker through the groin, which would require groin precautions for approximately a week, and could potentially further delay her PT progress.   Pt has no urgent indication for pacing, and is scheduled as an outpatient for Leadless pacing with Dr. Minna Merritts next week with [facility]. I suspect this will need to be moved due to CIR.   We would recommend proceed with rehabilitation and then following up with her established cardiology team. If her HRs interfere with therapy this can be revisited.  Legrand Como [address] [place], [place]  07/17/2020 11:38 AM

## 2020-07-17 NOTE — Progress Notes (Signed)
Inpatient Diabetes Program Recommendations  AACE/[name]: New Consensus Statement on Inpatient Glycemic Control (2015)  Target Ranges:  Prepandial:   less than 140 mg/dL      Peak postprandial:   less than 180 mg/dL (1-2 hours)      Critically ill patients:  140 - 180 mg/dL   Lab Results  Component Value Date   GLUCAP 264 (H) 07/17/2020   HGBA1C 8.5 (H) 07/12/2020    Review of Glycemic Control Results for Rachael Herrera, Rachael Herrera (MRN [id number]) as of 07/17/2020 12:07  Ref. Range 07/16/2020 20:15 07/16/2020 23:37 07/17/2020 03:54 07/17/2020 09:16 07/17/2020 11:20  Glucose-Capillary Latest Ref Range: 70 - 99 mg/dL 347 (H) 236 (H) 197 (H) 235 (H) 264 (H)   Diabetes history: DM2  Outpatient Diabetes medications: Lantus 80 units QHS, Humalog 3-13 units TID  Current orders for Inpatient glycemic control: Lantus 20 units daily, Novolog 0-9 units Q4H  Inpatient Diabetes Program Recommendations:     -Lantus 24 units daily -If patient is eating consider Novolog 0-9 units TID and 0-5 units QHS  Will continue to follow while inpatient.  Thank you, Reche Dixon, RN, BSN Diabetes Coordinator Inpatient Diabetes Program [phone] (team pager from 8a-5p)

## 2020-07-17 NOTE — Progress Notes (Signed)
Nephrology Follow-Up Consult note     Interval History/Subjective:  had 250 ml UOP over 2/22.  Last HD on 2/22 with 1 kg UF.  She is not able to offer additional history.  Review of systems: limited by aphasia and more sleepy today denies shortness of breath  No n/v No cp    Medications:  Current Facility-Administered Medications  Medication Dose Route Frequency Provider Last Rate Last Admin  . acetaminophen (TYLENOL) tablet 650 mg  650 mg Oral Q4H PRN Bailey-Modzik, Delila A, NP   650 mg at 07/14/20 2200   Or  . acetaminophen (TYLENOL) 160 MG/5ML solution 650 mg  650 mg Per Tube Q4H PRN Bailey-Modzik, Delila A, NP       Or  . acetaminophen (TYLENOL) suppository 650 mg  650 mg Rectal Q4H PRN Bailey-Modzik, Delila A, NP   650 mg at 07/11/20 1723  . amitriptyline (ELAVIL) tablet 25 mg  25 mg Oral QHS Bailey-Modzik, Delila A, NP   25 mg at 07/16/20 2145  . apixaban (ELIQUIS) tablet 5 mg  5 mg Oral BID Skeet Simmer, RPH   5 mg at 07/17/20 [id number]  . atorvastatin (LIPITOR) tablet 80 mg  80 mg Oral Daily Bailey-Modzik, Delila A, NP   80 mg at 07/17/20 0855  . Chlorhexidine Gluconate Cloth 2 % PADS 6 each  6 each Topical Daily Bailey-Modzik, Delila A, NP   6 each at 07/17/20 0856  . Chlorhexidine Gluconate Cloth 2 % PADS 6 each  6 each Topical Q0600 Claudia Desanctis, MD   6 each at 07/17/20 0503  . cinacalcet (SENSIPAR) tablet 60 mg  60 mg Oral Q breakfast Bailey-Modzik, Delila A, NP   60 mg at 07/17/20 0855  . feeding supplement (NEPRO CARB STEADY) liquid 237 mL  237 mL Oral TID BM Bailey-Modzik, Delila A, NP   Stopped at 07/17/20 0806  . insulin aspart (novoLOG) injection 0-9 Units  0-9 Units Subcutaneous Q4H Greta Doom, MD   3 Units at 07/17/20 [phone]  . insulin glargine (LANTUS) injection 20 Units  20 Units Subcutaneous Daily [name], MD   20 Units at 07/17/20 [phone]  . levothyroxine (SYNTHROID) tablet 75 mcg  75 mcg Oral QAC breakfast [name], MD   75 mcg at 07/17/20 0503   . MEDLINE mouth rinse  15 mL Mouth Rinse BID Bailey-Modzik, Delila A, NP   15 mL at 07/17/20 0857  . multivitamin (RENA-VIT) tablet 1 tablet  1 tablet Oral QHS Bailey-Modzik, Delila A, NP   1 tablet at 07/16/20 2145  . senna-docusate (Senokot-S) tablet 1 tablet  1 tablet Oral QHS PRN Bailey-Modzik, Delila A, NP      . sevelamer carbonate (RENVELA) tablet 800 mg  800 mg Oral TID WC Claudia Desanctis, MD   800 mg at 07/17/20 0855    Physical Exam: Vitals:   07/17/20 0800 07/17/20 0805  BP:  (!) 156/64  Pulse:  (!) 47  Resp: 18 18  Temp:  98.4 F (36.9 C)  SpO2:  98%   No intake/output data recorded.  Intake/Output Summary (Last 24 hours) at 07/17/2020 1101 Last data filed at 07/17/2020 0013 Gross per 24 hour  Intake --  Output 250 ml  Net -250 ml   General adult female in bed in no acute distress  HEENT normocephalic atraumatic Neck supple trachea midline Lungs clear to auscultation bilaterally normal work of breathing at rest  Heart S1S2 no rub Abdomen soft nontender nondistended; obese habitus Extremities no  pitting edema  Psych no anxiety or agitation  Neuro aphasia/word finding difficulty - does not speak but nods/shakes head Access AVF LUE     Test Results I personally reviewed new and old clinical labs and radiology tests Lab Results  Component Value Date   NA 134 (L) 07/17/2020   K 4.3 07/17/2020   CL 93 (L) 07/17/2020   CO2 24 07/17/2020   BUN 30 (H) 07/17/2020   CREATININE 7.96 (H) 07/17/2020   CALCIUM 8.6 (L) 07/17/2020   ALBUMIN 3.6 07/12/2020   PHOS 4.4 07/12/2020    Outpt Records: NEPHROLOGIST: Cay Schillings MD  LOCATION: [facility]  SCHEDULE: T-Th-S 1st Shift  EDW: 273 kg.  KIDNEY: [facility] F200N  LITERS PROC: 73.5 liters/treatment  HD TIME: 210  ACCESS: R IJ PC  NEEDLE SIZE:  ANTICOAG: Standard Heparin Per Protocol  BATH: 3.0CA-HCO3  QB: 350 ml/min  QD: 700 ml/min   Assessment/Recommendations: DAILA KOETTER is a/an 60 y.o. female with  a past medical history significant for ESRD, admitted for CVA after dialysis.     ESRD: - HD per TTS schedule while here  Hyperkalemia: resolved with Lokelma and HD  Hypertension/volume: optimize volume with HD; defer to primary with setting of CVA  Anemia: hgb acceptable; defer ESA setting of recent CVA  Secondary hyperparathyroidism: Would continue Sensipar 60 mg daily  DM2: mgmt per primary  CVA: s/p thrombectomy. Mgmt per primary  Bradycardia - noted. Not on beta blocker.  Per primary team   Claudia Desanctis, MD 07/17/2020 11:08 AM

## 2020-07-17 NOTE — Plan of Care (Signed)
  Problem: Education: Goal: Knowledge of disease or condition will improve Outcome: Progressing Goal: Knowledge of secondary prevention will improve Outcome: Progressing Goal: Knowledge of patient specific risk factors addressed and post discharge goals established will improve Outcome: Progressing   Problem: Coping: Goal: Will verbalize positive feelings about self Outcome: Progressing Goal: Will identify appropriate support needs Outcome: Progressing   Problem: Health Behavior/Discharge Planning: Goal: Ability to manage health-related needs will improve Outcome: Progressing   Problem: Self-Care: Goal: Ability to participate in self-care as condition permits will improve Outcome: Progressing Goal: Verbalization of feelings and concerns over difficulty with self-care will improve Outcome: Progressing Goal: Ability to communicate needs accurately will improve Outcome: Progressing   Problem: Nutrition: Goal: Risk of aspiration will decrease Outcome: Progressing Goal: Dietary intake will improve Outcome: Progressing   Problem: Ischemic Stroke/TIA Tissue Perfusion: Goal: Complications of ischemic stroke/TIA will be minimized Outcome: Progressing

## 2020-07-17 NOTE — Progress Notes (Signed)
  Speech Language Pathology Treatment: Dysphagia;Cognitive-Linquistic  Patient Details Name: Rachael Herrera MRN: [id number] DOB: 06-07-1960 Today's Date: 07/17/2020 Time: [id number] SLP Time Calculation (min) (ACUTE ONLY): 50 min  Assessment / Plan / Recommendation Clinical Impression  Pt seen for ongoing dysphagia and aphasia intervention following acute CVA. Pt with minimal intake of her breakfast tray nearby in room and lunch tray arrived during visit. Pt able to self feed with her left hand and SLP attempted to have Pt anchor her bowls with her right hand, however essentially unsuccessful due to severity of weakness and needed feeding assist for certain items. Pt observed taking large bites and exhibited labial spillage and occasional delayed coughing. SLP provided verbal and visual cues to decrease bite size and rate. Pt verbalized some automatic utterance, "Ok, alright, bye", but mostly nonverbal and/or unintelligible with prompted verbal attempts. She easily matched intonation and tried to sing along Fritzi Mandes, Alfonzo Feller, Rowe Robert) and smiled and moved her shoulders frequently. She was able to identify her name written in field of two. Pt also appears to exhibit some right neglect as she needed cues for attention to her right hand and items on the right side of her tray. Strongly recommend additional SLP therapy to address communication and swallowing deficits (aphasia, dysarthria, and dysphagia). SLP will continue to follow in acute setting. She will benefit from feeding assist and follow up for suspected lingual thrush (RN notified).   HPI HPI: 60 yr old experienced difficulty speaking and moving the right side. MRI revelaed acute ischemic infarcts within the left MCA and PCA territories with a small amount of petechial blood products in the left basal  ganglia. Underwent emergent thrombectomy of left MCA M2 occlusion and revascularization. Intubated 2/17 during procedure.      SLP  Plan  Continue with current plan of care       Recommendations  Diet recommendations: Dysphagia 2 (fine chop);Thin liquid Liquids provided via: Cup;No straw Medication Administration: Whole meds with puree Supervision: Staff to assist with self feeding;Full supervision/cueing for compensatory strategies Compensations: Minimize environmental distractions;Slow rate;Small sips/bites;Lingual sweep for clearance of pocketing Postural Changes and/or Swallow Maneuvers: Seated upright 90 degrees                Oral Care Recommendations: Oral care BID;Staff/trained caregiver to provide oral care (white coating on tongue, RN notified) Follow up Recommendations: Inpatient Rehab SLP Visit Diagnosis: Dysphagia, unspecified (R13.10);Aphasia (R47.01) Plan: Continue with current plan of care       Thank you,  Genene Churn, Jacobus                Thank you,  Genene Churn, [place]  [place] 07/17/2020, 12:31 PM

## 2020-07-17 NOTE — PMR Pre-admission (Signed)
PMR Admission Coordinator Pre-Admission Assessment  Patient: Rachael Herrera is an 60 y.o., female MRN: [id number] DOB: [birth year] Height: _0  (170.2 cm) Weight: 117.5 kg              Insurance Information HMO:    PPO: yes     PCP:      IPA:      80/20:      OTHER:  PRIMARY: UHC Medicare      Policy#: [id number]      Subscriber: Pt CM Name: Roselyn Reef (Auth done for [facility] CIR to begin with, then transferred to [place])     Phone#: [phone]    Fax#: [phone] Pre-Cert#: [id number] for 7 days with update due 07/24/20      Employer: Not employed Benefits:  Phone #: [phone]     Name: Checked in online portal Eff. Date: 05/25/20     Deduct: $0      Out of Pocket Max: [phone] (met $0)      Life Max: N/A  CIR: $1480 admission copay      SNF: $0 copay for days 1-20; $194.50 for days 21-100 Outpatient: 100%     Co-Pay: none Home Health: 100%      Co-Pay: none DME: 80%     Co-Pay: 20% Providers: in network  SECONDARY: Medicaid of [place] with coverage code [facility] and is eligible on 5/46/56     Policy#: [id number]      Phone#: [phone]  Financial Counselor:        Phone#:    The "Data Collection Information Summary" for patients in Inpatient Rehabilitation Facilities with attached "Privacy Act [place] Records" was provided and verbally reviewed with: Patient  Emergency Contact Information Contact Information    Name Relation Home Work Mobile   [place] Daughter   [phone]   Jamison Oka Sister   [phone]     Current Medical History  Patient Admitting Diagnosis: CVA  History of Present Illness: Rachael Herrera is a 60 y.o. right-handed female with history of hypertension as well as hyperlipidemia, end-stage renal disease with hemodialysis type 2 diabetes mellitus, NSVT /PVCs and bradycardia OSA, morbid obesity BMI 39.95.  Presented 07/11/2020 with right side weakness gaze preference and inability to communicate.  BP in the 170s.  Of note she was seen  07/09/2020 in the ED at [facility] for another episode that occurred during dialysis as well as bouts of bradycardia.  CT the head was suggested but she left without this test.  Patient had been followed in the past for bradycardia consider plans for pacemaker.  Cranial CT scan showed acute left occipital lobe infarction.  Suspect acute infarction of the body of the left caudate.  CTA showed no large vessel occlusion or hemodynamically significant stenosis in the neck.  Nonocclusive thrombus at the left MCA bifurcation.  Occlusion of proximal left M3 MCA branch.  Patient underwent successful thrombectomy per interventional radiology for M2 occlusion.  Echocardiogram with ejection fraction of 55 to 60% no wall motion abnormalities.  Latest MRI imaging showed acute ischemic infarct within the left MCA and PCA territories with a small amount of petechial blood products in left basal ganglia.  No midline shift or mass-effect as of 07/12/2020.  Patient is currently maintained on intravenous heparin and cardiology continues to follow for patient's history of bradycardia as well as a flutter.  She is currently on a dysphagia #2 thin liquid diet.  Hemodialysis ongoing as per renal services.  Therapy evaluations  completed due to patient's right side weakness and inability to communicate recommendations for physical medicine rehab consult.  Complete NIHSS TOTAL: 12 Glasgow Coma Scale Score: 14  Past Medical History  History reviewed. No pertinent past medical history.  Family History  family history is not on file.  Prior Rehab/Hospitalizations:  Has the patient had prior rehab or hospitalizations prior to admission? Yes  Has the patient had major surgery during 100 days prior to admission? Yes  Current Medications   Current Facility-Administered Medications:  .  acetaminophen (TYLENOL) tablet 650 mg, 650 mg, Oral, Q4H PRN, 650 mg at 07/14/20 2200 **OR** acetaminophen (TYLENOL) 160 MG/5ML  solution 650 mg, 650 mg, Per Tube, Q4H PRN **OR** acetaminophen (TYLENOL) suppository 650 mg, 650 mg, Rectal, Q4H PRN, Bailey-Modzik, Delila A, NP, 650 mg at 07/11/20 1723 .  amitriptyline (ELAVIL) tablet 25 mg, 25 mg, Oral, QHS, Bailey-Modzik, Delila A, NP, 25 mg at 07/17/20 2136 .  amLODipine (NORVASC) tablet 10 mg, 10 mg, Oral, Daily, [name], MD, 10 mg at 07/18/20 0940 .  apixaban (ELIQUIS) tablet 5 mg, 5 mg, Oral, BID, Skeet Simmer, RPH, 5 mg at 07/18/20 0940 .  atorvastatin (LIPITOR) tablet 80 mg, 80 mg, Oral, Daily, Bailey-Modzik, Delila A, NP, 80 mg at 07/18/20 0940 .  Chlorhexidine Gluconate Cloth 2 % PADS 6 each, 6 each, Topical, Daily, Bailey-Modzik, Delila A, NP, 6 each at 07/17/20 0856 .  Chlorhexidine Gluconate Cloth 2 % PADS 6 each, 6 each, Topical, Q0600, Claudia Desanctis, MD, 6 each at 07/17/20 0503 .  Chlorhexidine Gluconate Cloth 2 % PADS 6 each, 6 each, Topical, Q0600, Claudia Desanctis, MD, 6 each at 07/18/20 0600 .  cinacalcet (SENSIPAR) tablet 60 mg, 60 mg, Oral, Q breakfast, Bailey-Modzik, Delila A, NP, 60 mg at 07/18/20 0947 .  feeding supplement (NEPRO CARB STEADY) liquid 237 mL, 237 mL, Oral, TID BM, Bailey-Modzik, Delila A, NP, Last Rate: 0 mL/hr at 07/17/20 0806, 237 mL at 07/17/20 2028 .  insulin aspart (novoLOG) injection 0-5 Units, 0-5 Units, Subcutaneous, QHS, [name], MD, 4 Units at 07/17/20 2137 .  insulin aspart (novoLOG) injection 0-9 Units, 0-9 Units, Subcutaneous, TID WC, [name], MD, 3 Units at 07/18/20 0701 .  insulin glargine (LANTUS) injection 15 Units, 15 Units, Subcutaneous, BID, [name], MD, 15 Units at 07/18/20 [phone] .  levothyroxine (SYNTHROID) tablet 75 mcg, 75 mcg, Oral, QAC breakfast, [name], MD, 75 mcg at 07/18/20 0600 .  losartan (COZAAR) tablet 50 mg, 50 mg, Oral, Daily, [name], MD, 50 mg at 07/18/20 0940 .  MEDLINE mouth rinse, 15 mL, Mouth Rinse, BID, Bailey-Modzik, Delila A, NP, 15 mL at 07/18/20 0941 .  multivitamin  (RENA-VIT) tablet 1 tablet, 1 tablet, Oral, QHS, Bailey-Modzik, Delila A, NP, 1 tablet at 07/17/20 2136 .  nystatin (MYCOSTATIN) 100000 UNIT/ML suspension 500,000 Units, 5 mL, Oral, QID, [name], MD, 500,000 Units at 07/18/20 0940 .  senna-docusate (Senokot-S) tablet 1 tablet, 1 tablet, Oral, QHS PRN, Bailey-Modzik, Delila A, NP .  sevelamer carbonate (RENVELA) tablet 800 mg, 800 mg, Oral, TID WC, Claudia Desanctis, MD, 800 mg at 07/18/20 0940  Patients Current Diet:  Diet Order            DIET DYS 2 Room service appropriate? No; Fluid consistency: Thin  Diet effective now                 Precautions / Restrictions Precautions Precautions: Fall Restrictions Weight Bearing Restrictions: No  Has the patient had 2 or more falls or a fall with injury in the past year?No  Prior Activity Level Community (5-7x/wk): Pt. was active in the community PTA  Prior Functional Level Prior Function Level of Independence: Independent Comments: pt driving herself to dialysis prior to admission  Self Care: Did the patient need help bathing, dressing, using the toilet or eating?  Independent  Indoor Mobility: Did the patient need assistance with walking from room to room (with or without device)? Independent  Stairs: Did the patient need assistance with internal or external stairs (with or without device)? Independent  Functional Cognition: Did the patient need help planning regular tasks such as shopping or remembering to take medications? Independent  Home Assistive Devices / Equipment    Prior Device Use: Indicate devices/aids used by the patient prior to current illness, exacerbation or injury? None of the above  Current Functional Level Cognition  Arousal/Alertness: Awake/alert Overall Cognitive Status: Impaired/Different from baseline Orientation Level: Oriented X4 Following Commands: Follows one step commands inconsistently,Follows one step commands with increased  time Safety/Judgement: Decreased awareness of safety,Decreased awareness of deficits General Comments: Pt impulsive in starting transitions prior to being cued at times. Needs repeated simple commands to direct pt to attend to R side and maintain safety. Attention: Sustained Sustained Attention: Impaired Sustained Attention Impairment: Verbal basic,Functional basic Memory:  (TBA) Awareness: Impaired Awareness Impairment: Emergent impairment Problem Solving: Impaired Problem Solving Impairment: Functional basic Behaviors: Impulsive Safety/Judgment: Impaired    Extremity Assessment (includes Sensation/Coordination)  Upper Extremity Assessment: Defer to OT evaluation RUE Deficits / Details: AROM 80 shoulder flexion with automatic task and strong encouragement. Pt decreased grasp and using L hand instead of using R UE for task RUE Coordination: decreased fine motor,decreased gross motor  Lower Extremity Assessment: Generalized weakness (pt slow to move BLE, but able to use funtionally for transfer. MMT limited due to impariged cognition)    ADLs  Overall ADL's : Needs assistance/impaired Eating/Feeding: Moderate assistance Eating/Feeding Details (indicate cue type and reason): spilling cup tryign to pick it up Grooming: Wash/dry face,Minimal assistance,Sitting Grooming Details (indicate cue type and reason): only washing L side of face. pt with wash cloth placed on face to see if pt would remove it due to communication deficits. pt shaking head to make it fall instead of using either arm Upper Body Bathing: Maximal assistance Lower Body Bathing: Moderate assistance Upper Body Dressing : Moderate assistance Lower Body Dressing: Moderate assistance Toilet Transfer: +2 for physical assistance,Moderate assistance,Stand-pivot Toilet Transfer Details (indicate cue type and reason): simulate EOB to chair General ADL Comments: pt motivated by [employer]. pt with x4 solitaire games loaded on  ipad.    Mobility  Overal bed mobility: Needs Assistance Bed Mobility: Supine to Sit Rolling: Min assist Supine to sit: HOB elevated,Min assist Sit to supine: HOB elevated,Mod assist General bed mobility comments: HOB elevated, pt initiating moving legs towards EOB but needing minA for the trunk with HHA pt pulling on PT's hand to sit up EOB.    Transfers  Overall transfer level: Needs assistance Equipment used: Rolling walker (2 wheeled) Transfers: Sit to/from Stand Sit to Stand: From elevated surface,Min assist Stand pivot transfers: +2 safety/equipment,Min assist General transfer comment: Sit to stand 1x from EOB and 4x from recliner with minA for stability and cueing hand-over-hand for pt to use R hand to push up to stand and reach back to sit. MinAx2 for safety with stand step to R.    Ambulation / Gait / Stairs /  Wheelchair Mobility  Ambulation/Gait Ambulation/Gait assistance: Min assist,+2 safety/equipment Gait Distance (Feet): 25 Feet Assistive device: Rolling walker (2 wheeled) Gait Pattern/deviations: Step-through pattern,Decreased step length - right,Decreased stride length General Gait Details: Pt ambulates slowly with some unsteadiness, especially when stepping posteriorly, needing minAx2 for safety. She tends to push RW distal anteriorly and needs physical A to keep it proximal. Hand-over-hand directing of pt to attend to R hand for proper placement on RW. Gait velocity: decreased Gait velocity interpretation: <1.8 ft/sec, indicate of risk for recurrent falls    Posture / Balance Balance Overall balance assessment: Needs assistance Sitting-balance support: Single extremity supported,Feet supported Sitting balance-Leahy Scale: Fair Standing balance support: Bilateral upper extremity supported,During functional activity Standing balance-Leahy Scale: Poor Standing balance comment: External assistance and UE support on RW.    Special needs/care consideration Skin WDL,  Diabetic management On insulin for DM and Special service needs HD T-Th-Sat - in HD today 07/18/20     Previous Home Environment (from acute therapy documentation) Living Arrangements: Other (Comment)  Lives With: Daughter Available Help at Discharge: Family Type of Home: House Home Layout: One level Home Access: Stairs to enter Entrance Stairs-Rails: Can reach both,Right,Left Entrance Stairs-Number of Steps: 8 Bathroom Shower/Tub: Multimedia programmer: Handicapped height Bathroom Accessibility: Yes How Accessible: Accessible via walker [place]: No Additional Comments: pt unable to assist with history due to aphasia, no family present  Discharge Living Setting Plans for Discharge Living Setting: Patient's home Type of Home at Discharge: House Discharge Home Layout: One level Discharge Home Access: Stairs to enter Entrance Stairs-Rails: [place] reach both Entrance Stairs-Number of Steps: 8 Discharge Bathroom Shower/Tub: Walk-in shower Discharge Bathroom Toilet: Handicapped height Discharge Bathroom Accessibility: Yes How Accessible: Accessible via walker  [place] Patient Roles: Other (Comment) Contact Information: [phone] Anticipated Caregiver: Mckynlie Vanderslice Anticipated Caregiver's Contact Information: [phone] Ability/Limitations of Caregiver: Can provide Min A after a few weeks, but giving birth 2nd week of March Caregiver Availability: 24/7 Discharge Plan Discussed with Primary Caregiver: Yes Is Caregiver In Agreement with Plan?: Yes Does Caregiver/Family have Issues with Lodging/Transportation while Pt is in Rehab?: No  Goals Patient/Family Goal for Rehab: PT/OT/SLP Supervision Expected length of stay: 16-21 days Pt/Family Agrees to Admission and willing to participate: Yes Program Orientation Provided & Reviewed with Pt/Caregiver Including Roles  & Responsibilities: Yes  Decrease burden of Care through IP  rehab admission: Specialzed equipment needs, Decrease number of caregivers, Bowel and bladder program and Patient/family education  Possible need for SNF placement upon discharge: not anticipated  Patient Condition: This patient's medical and functional status has changed since the consult dated: 07/15/20 in which the Rehabilitation Physician determined and documented that the patient's condition is appropriate for intensive rehabilitative care in an inpatient rehabilitation facility. However, rehab MD thought patient was going to [facility].  [facility] did not admit patient due to HR low.  Rehab MD reviewed chart and spoke with acute hospital MD and approved CIR admit.  See "History of Present Illness" (above) for medical update. Functional changes are: Currently requiring min assist to ambulate 25 feet and mod/max assist for ADL tasks. Patient's medical and functional status update has been discussed with the Rehabilitation physician and patient remains appropriate for inpatient rehabilitation. Will admit to inpatient rehab today.  Preadmission Screen Completed By:  Retta Diones, RN, 07/18/2020 12:01 PM ______________________________________________________________________   Discussed status with Dr. Ranell Patrick on 07/18/20 at 34 and received approval for admission today.  Admission Coordinator:  Lavonna Monarch  Robyne Peers, time 1332/Date 07/18/20

## 2020-07-17 NOTE — Progress Notes (Signed)
STROKE TEAM PROGRESS NOTE   INTERVAL HISTORY No acute events. Pt lying in bed, awake alert and interactive. However, still global aphasia, only able to close eyes open eyes at request. Not able to follow other commands but able to mimic. On eliquis now. [facility] CIR declined admission due to her HR at 40s. She has appointment to have leadless pacemaker placement next week with her cardiologist. Currently, I have discussed with EP PA Jonni Sanger and CIR Dr. Posey Pronto, will get her admitted to our CIR.    Vitals:   07/17/20 0800 07/17/20 0805 07/17/20 1119 07/17/20 1606  BP:  (!) 156/64 (!) 163/53 (!) 170/67  Pulse:  (!) 47 (!) 45 (!) 49  Resp: 18 18    Temp:  98.4 F (36.9 C) 99.4 F (37.4 C) 98.2 F (36.8 C)  TempSrc:  Oral Oral Oral  SpO2:  98%  100%  Weight:      Height:       CBC:  Recent Labs  Lab 07/11/20 1350 07/11/20 1446 07/16/20 0351 07/17/20 0420  WBC 6.4   < > 6.8 6.3  NEUTROABS 3.3  --   --   --   HGB 12.6   < > 10.0* 10.9*  HCT 40.9   < > 31.5* 33.3*  MCV 99.3   < > 97.2 96.0  PLT 179   < > 173 184   < > = values in this interval not displayed.   Basic Metabolic Panel:  Recent Labs  Lab 07/11/20 2019 07/12/20 1141 07/13/20 0740 07/16/20 0351 07/17/20 0420  NA 130* 134*   < > 132* 134*  K 6.0* 5.2*   < > 4.3 4.3  CL 97* 98   < > 93* 93*  CO2 18* 23   < > 21* 24  GLUCOSE 488* 231*   < > 245* 195*  BUN 27* 30*   < > 48* 30*  CREATININE 6.32* 8.13*   < > 10.58* 7.96*  CALCIUM 8.0* 8.4*   < > 8.3* 8.6*  MG  --  1.9  --   --   --   PHOS 3.7 4.4  --   --   --    < > = values in this interval not displayed.   Lipid Panel:  Recent Labs  Lab 07/12/20 0229  CHOL 156  TRIG 382*  HDL 31*  CHOLHDL 5.0  VLDL 76*  LDLCALC 49   HgbA1c:  Recent Labs  Lab 07/12/20 0229  HGBA1C 8.5*   Urine Drug Screen: No results for input(s): LABOPIA, COCAINSCRNUR, LABBENZ, AMPHETMU, THCU, LABBARB in the last 168 hours.  Alcohol Level No results for input(s): ETH in the last  168 hours.  IMAGING past 24 hours No results found. PHYSICAL EXAM Constitutional: Obese middle-aged female sitting up in bed in NAD.  Psych: Affect appropriate to situation Eyes: No scleral injection HENT: No OP obstrucion MSK: no joint deformities.  CV: Irregular rate and rhthym Respiratory: no extra work of breathing  GI: Soft.  No distension. There is no tenderness.  Skin: WDI  Neuro: Awake alert, eyes open, smile to provider, however, global aphasia, nonverbal, but able to close eyes and open mouth, not following other commands. Right facial droop.Able to hold BUEs without drift, but right UE pronator drift and decreased finger grip. BLE proximal and distal 3/5. Sensation, coordination not cooperative and gait not tested.   ASSESSMENT/PLAN Rachael Herrera is a 60 y.o. female with PMH significant for ESRD, HTN, OSA  on CPAP, HLD, DM2 with peripheral neuropathy, remote seizure d/t hypoglycemia, morbid obesity, BLE claudication, tachybrady syndrome with pacemaker, NSVT, anemia, Vitamin B12 deficiency, MRSA PNA, chronic bronchitis, hypothyroidism, and  bilat ulnar neuropathy who was LKW at dialysis 2/17. She drove herself to dialysis and then back home again. Soon thereafter, she was noted to have right sided weakness with gaze preference and inability to communicate. EMS was called and she was brought in as a code stroke. BP in the 170s, glucose wnl.   Stroke - Acute ischemic infarcts within the left MCA and PCA territories with a small amount of petechial blood products in the left basal ganglia d/t L MCA M2 occlusion s/p successful thrombectomy etiology likely cardioembolic from atrial flutter/fibrillation  CT showed left occipital and left caudate infarct.    CTA head and neck left M1/M2 thrombus, left M3 occlusion.    IR performed with left M2 TICI3 reperfusion.    MRI showed left MCA and PCA infarct with BG petechial hemorrhage.    2D Echo EF 55-60%, No shunt   LDL 49  HgbA1c  8.5  No antithrombotics PTA, now on eliquis. Continue on discharge  Therapy recommendations:  CIR  Disposition:  Pending    Atrial flutter, new diagnosis  Known hx of some degree of PVCs, NSVT, and bradycardia with pacemaker and recent history of aborted replacement attempt. Also declined admission and  MICRA implant  2/15 at [facility].  EP consult called for possible aflutter. Appreciate recommendations.   Heparin drip initiated 2/18 evening, switched to Eliquis  No bradycardia episodes seen  Continue cardiac monitoring   Bradycardia   HR high 40s  asymptomatic  failed R sided PPM implant (L sided AVF).    She is an established patient of [facility] and has an upcoming appointment for [facility] declined her admission  Discussed with EP PA Jonni Sanger - not able to have leadless pacemaker done at this time  Will admit to our CIR first  Hypertension  Home meds:  Norvasc '10mg'$  daily, losartan '50mg'$  daily torsemide '20mg'$  daily.    BP stable on the high end  Resume norvasc 10 and losartan 50 . Long-term BP goal normotensive   Hyperlipidemia  Home meds:  80 mg Lipitor daily   LDL 49 at goal < 70  On lipitor 80 now  Continue statin at discharge  Diabetes type II Uncontrolled  Home meds:  Lantus 80 units at bedtime   HgbA1c 8.5, goal < 7.0  CBGs with labile blood glucose with hypoglycemic episodes x 2 treated with D50 on 2/18 now improved -> hyperglycemia  DM coordinator on board - on lantus 20 daily -> 15 bid   SSI   ESRD on HD  Nephrology consulted and following. Appreciate assistance.  Hyperkalemia: HD today  Daily renal panel   Daily weights, Daily Renal Panel, Strict I/Os, Avoid nephrotoxins (NSAIDs, judicious IV Contrast)  Other Stroke Risk Factors  Former Cigarette smoker x 27 years, quit 2019  Obesity, Body mass index is 40.36 kg/m., BMI >/= 30 associated with increased stroke risk, recommend weight loss, diet and exercise as appropriate    Family hx stroke (Sister)  Obstructive sleep apnea, on CPAP at home  Other Active Problems:  Oral thrash - on nystatin oral suspension  Hospital day # 6   [name], MD PhD Stroke Neurology 07/17/2020 6:36 PM       To contact Stroke Continuity provider, please refer to [email or web address] After hours, contact General  Neurology

## 2020-07-17 NOTE — TOC Benefit Eligibility Note (Signed)
Transition of Care [facility]) Benefit Eligibility Note    Patient Details  Name: Rachael Herrera MRN: [id number] Date of Birth: [birth year]   Medication/Dose: ELIQUIS  5 MG BID and  ELIQUIS  2.5 MG BID  Covered?: Yes  Tier: 3 Drug  Prescription Coverage Preferred Pharmacy: [facility]  Spoke with Person/Company/Phone Number:: DEL   @  [employer]  RX #  [phone]  Co-Pay: $ 9.85  Prior Approval: No  Deductible:  (NO DEDUCTIBLE WITH PLAN, OUT-OF-POCKET:UNMET , LOWE INCOME SUBSIDY LEVEL -1)  Additional Notes: ALL BRAND MED - $ 9.85  and  GENERIC MED- $3.95    Memory Argue Phone Number: 07/17/2020, 10:21 AM

## 2020-07-18 ENCOUNTER — Other Ambulatory Visit: Payer: Self-pay

## 2020-07-18 ENCOUNTER — Encounter (HOSPITAL_COMMUNITY): Payer: Self-pay | Admitting: Physical Medicine & Rehabilitation

## 2020-07-18 ENCOUNTER — Inpatient Hospital Stay (HOSPITAL_COMMUNITY)
Admission: RE | Admit: 2020-07-18 | Discharge: 2020-07-23 | DRG: 056 | Disposition: A | Discharge status: Other Acute Inpatient Hospital | Payer: Medicare Other | Source: Intra-hospital | Attending: Physical Medicine & Rehabilitation | Admitting: Physical Medicine & Rehabilitation

## 2020-07-18 ENCOUNTER — Encounter (HOSPITAL_COMMUNITY): Payer: Self-pay | Admitting: Neurology

## 2020-07-18 DIAGNOSIS — N186 End stage renal disease: Secondary | ICD-10-CM

## 2020-07-18 DIAGNOSIS — Z23 Encounter for immunization: Secondary | ICD-10-CM | POA: Diagnosis not present

## 2020-07-18 DIAGNOSIS — R401 Stupor: Secondary | ICD-10-CM | POA: Diagnosis not present

## 2020-07-18 DIAGNOSIS — Z79899 Other long term (current) drug therapy: Secondary | ICD-10-CM | POA: Diagnosis not present

## 2020-07-18 DIAGNOSIS — I10 Essential (primary) hypertension: Secondary | ICD-10-CM | POA: Diagnosis present

## 2020-07-18 DIAGNOSIS — E871 Hypo-osmolality and hyponatremia: Secondary | ICD-10-CM | POA: Diagnosis present

## 2020-07-18 DIAGNOSIS — I63512 Cerebral infarction due to unspecified occlusion or stenosis of left middle cerebral artery: Secondary | ICD-10-CM | POA: Diagnosis not present

## 2020-07-18 DIAGNOSIS — Z8249 Family history of ischemic heart disease and other diseases of the circulatory system: Secondary | ICD-10-CM | POA: Diagnosis not present

## 2020-07-18 DIAGNOSIS — D649 Anemia, unspecified: Secondary | ICD-10-CM | POA: Diagnosis present

## 2020-07-18 DIAGNOSIS — E1142 Type 2 diabetes mellitus with diabetic polyneuropathy: Secondary | ICD-10-CM | POA: Diagnosis present

## 2020-07-18 DIAGNOSIS — Z992 Dependence on renal dialysis: Secondary | ICD-10-CM

## 2020-07-18 DIAGNOSIS — Z87891 Personal history of nicotine dependence: Secondary | ICD-10-CM

## 2020-07-18 DIAGNOSIS — N2581 Secondary hyperparathyroidism of renal origin: Secondary | ICD-10-CM | POA: Diagnosis present

## 2020-07-18 DIAGNOSIS — I495 Sick sinus syndrome: Secondary | ICD-10-CM | POA: Diagnosis present

## 2020-07-18 DIAGNOSIS — R1310 Dysphagia, unspecified: Secondary | ICD-10-CM | POA: Diagnosis present

## 2020-07-18 DIAGNOSIS — I69391 Dysphagia following cerebral infarction: Secondary | ICD-10-CM

## 2020-07-18 DIAGNOSIS — Z833 Family history of diabetes mellitus: Secondary | ICD-10-CM | POA: Diagnosis not present

## 2020-07-18 DIAGNOSIS — R4182 Altered mental status, unspecified: Secondary | ICD-10-CM | POA: Diagnosis not present

## 2020-07-18 DIAGNOSIS — Z6839 Body mass index (BMI) 39.0-39.9, adult: Secondary | ICD-10-CM

## 2020-07-18 DIAGNOSIS — E875 Hyperkalemia: Secondary | ICD-10-CM | POA: Diagnosis present

## 2020-07-18 DIAGNOSIS — Z79890 Hormone replacement therapy (postmenopausal): Secondary | ICD-10-CM

## 2020-07-18 DIAGNOSIS — I4892 Unspecified atrial flutter: Secondary | ICD-10-CM | POA: Diagnosis present

## 2020-07-18 DIAGNOSIS — I611 Nontraumatic intracerebral hemorrhage in hemisphere, cortical: Secondary | ICD-10-CM | POA: Diagnosis not present

## 2020-07-18 DIAGNOSIS — I69351 Hemiplegia and hemiparesis following cerebral infarction affecting right dominant side: Secondary | ICD-10-CM | POA: Diagnosis not present

## 2020-07-18 DIAGNOSIS — Z794 Long term (current) use of insulin: Secondary | ICD-10-CM

## 2020-07-18 DIAGNOSIS — G4733 Obstructive sleep apnea (adult) (pediatric): Secondary | ICD-10-CM | POA: Diagnosis present

## 2020-07-18 DIAGNOSIS — I120 Hypertensive chronic kidney disease with stage 5 chronic kidney disease or end stage renal disease: Secondary | ICD-10-CM | POA: Diagnosis present

## 2020-07-18 DIAGNOSIS — E538 Deficiency of other specified B group vitamins: Secondary | ICD-10-CM | POA: Diagnosis present

## 2020-07-18 DIAGNOSIS — E1122 Type 2 diabetes mellitus with diabetic chronic kidney disease: Secondary | ICD-10-CM | POA: Diagnosis present

## 2020-07-18 DIAGNOSIS — H5462 Unqualified visual loss, left eye, normal vision right eye: Secondary | ICD-10-CM | POA: Diagnosis present

## 2020-07-18 DIAGNOSIS — R404 Transient alteration of awareness: Secondary | ICD-10-CM | POA: Diagnosis not present

## 2020-07-18 DIAGNOSIS — R001 Bradycardia, unspecified: Secondary | ICD-10-CM | POA: Diagnosis not present

## 2020-07-18 DIAGNOSIS — I69320 Aphasia following cerebral infarction: Secondary | ICD-10-CM

## 2020-07-18 DIAGNOSIS — R4701 Aphasia: Secondary | ICD-10-CM

## 2020-07-18 DIAGNOSIS — E669 Obesity, unspecified: Secondary | ICD-10-CM

## 2020-07-18 LAB — GLUCOSE, CAPILLARY
Glucose-Capillary: 248 mg/dL — ABNORMAL HIGH (ref 70–99)
Glucose-Capillary: 236 mg/dL — ABNORMAL HIGH (ref 70–99)
Glucose-Capillary: 175 mg/dL — ABNORMAL HIGH (ref 70–99)
Glucose-Capillary: 228 mg/dL — ABNORMAL HIGH (ref 70–99)

## 2020-07-18 LAB — BASIC METABOLIC PANEL WITH GFR
Anion gap: 14 (ref 5–15)
BUN: 44 mg/dL — ABNORMAL HIGH (ref 8–23)
CO2: 25 mmol/L (ref 22–32)
Calcium: 8.7 mg/dL — ABNORMAL LOW (ref 8.9–10.3)
Chloride: 93 mmol/L — ABNORMAL LOW (ref 98–111)
Creatinine, Ser: 9.38 mg/dL — ABNORMAL HIGH (ref 0.44–1.00)
GFR, Estimated: 4 mL/min — ABNORMAL LOW (ref >60)
Glucose, Bld: 237 mg/dL — ABNORMAL HIGH (ref 70–99)
Potassium: 4.5 mmol/L (ref 3.5–5.1)
Sodium: 132 mmol/L — ABNORMAL LOW (ref 135–145)

## 2020-07-18 LAB — CBC
HCT: 33.8 % — ABNORMAL LOW (ref 36.0–46.0)
Hemoglobin: 10.7 g/dL — ABNORMAL LOW (ref 12.0–15.0)
MCH: 30.6 pg (ref 26.0–34.0)
MCHC: 31.7 g/dL (ref 30.0–36.0)
MCV: 96.6 fL (ref 80.0–100.0)
Platelets: 214 K/uL (ref 150–400)
RBC: 3.5 MIL/uL — ABNORMAL LOW (ref 3.87–5.11)
RDW: 15.3 % (ref 11.5–15.5)
WBC: 7.4 K/uL (ref 4.0–10.5)
nRBC: 0 % (ref 0.0–0.2)

## 2020-07-18 MED ORDER — ALUMINUM HYDROXIDE GEL 320 MG/5ML PO SUSP
10.0000 mL | Freq: Four times a day (QID) | ORAL | Status: DC | PRN
  Filled 2020-07-18: qty 30

## 2020-07-18 MED ORDER — BISACODYL 10 MG RE SUPP: 10.0000 mg | Freq: Every day | RECTAL | Status: DC | PRN

## 2020-07-18 MED ORDER — PNEUMOCOCCAL VAC POLYVALENT 25 MCG/0.5ML IJ INJ
0.5000 mL | INJECTION | INTRAMUSCULAR | Status: AC
  Administered 2020-07-21: 0.5 mL via INTRAMUSCULAR

## 2020-07-18 MED ORDER — ORAL CARE MOUTH RINSE
15.0000 mL | Freq: Two times a day (BID) | OROMUCOSAL | Status: DC
  Administered 2020-07-19 – 2020-07-23 (×9): 15 mL via OROMUCOSAL

## 2020-07-18 MED ORDER — SEVELAMER CARBONATE 800 MG PO TABS
800.0000 mg | ORAL_TABLET | Freq: Three times a day (TID) | ORAL | Status: DC
  Administered 2020-07-19 – 2020-07-23 (×15): 800 mg via ORAL
  Filled 2020-07-18 (×15): qty 1

## 2020-07-18 MED ORDER — INSULIN ASPART 100 UNIT/ML ~~LOC~~ SOLN
0.0000 [IU] | Freq: Three times a day (TID) | SUBCUTANEOUS | Status: DC
  Administered 2020-07-19 (×2): 2 [IU] via SUBCUTANEOUS
  Administered 2020-07-19: 3 [IU] via SUBCUTANEOUS
  Administered 2020-07-20: 1 [IU] via SUBCUTANEOUS
  Administered 2020-07-20: 2 [IU] via SUBCUTANEOUS
  Administered 2020-07-20 – 2020-07-21 (×2): 3 [IU] via SUBCUTANEOUS
  Administered 2020-07-21: 5 [IU] via SUBCUTANEOUS
  Administered 2020-07-21: 1 [IU] via SUBCUTANEOUS
  Administered 2020-07-22: 3 [IU] via SUBCUTANEOUS
  Administered 2020-07-22: 2 [IU] via SUBCUTANEOUS
  Administered 2020-07-22: 1 [IU] via SUBCUTANEOUS
  Administered 2020-07-23: 3 [IU] via SUBCUTANEOUS
  Administered 2020-07-23: 1 [IU] via SUBCUTANEOUS

## 2020-07-18 MED ORDER — DIPHENHYDRAMINE HCL 12.5 MG/5ML PO ELIX: 12.5000 mg | ORAL_SOLUTION | Freq: Four times a day (QID) | ORAL | Status: DC | PRN

## 2020-07-18 MED ORDER — MILK AND MOLASSES ENEMA
1.0000 | Freq: Every day | RECTAL | Status: DC | PRN
  Filled 2020-07-18: qty 240

## 2020-07-18 MED ORDER — CINACALCET HCL 30 MG PO TABS
60.0000 mg | ORAL_TABLET | Freq: Every day | ORAL | Status: DC
  Administered 2020-07-19 – 2020-07-23 (×5): 60 mg via ORAL
  Filled 2020-07-18 (×6): qty 2

## 2020-07-18 MED ORDER — LEVOTHYROXINE SODIUM 75 MCG PO TABS
75.0000 ug | ORAL_TABLET | Freq: Every day | ORAL | Status: DC
  Administered 2020-07-19 – 2020-07-23 (×5): 75 ug via ORAL
  Filled 2020-07-18 (×5): qty 1

## 2020-07-18 MED ORDER — CHLORHEXIDINE GLUCONATE CLOTH 2 % EX PADS
6.0000 | MEDICATED_PAD | Freq: Every day | CUTANEOUS | Status: DC
  Administered 2020-07-22: 6 via TOPICAL

## 2020-07-18 MED ORDER — TRAZODONE HCL 50 MG PO TABS: 25.0000 mg | ORAL_TABLET | Freq: Every evening | ORAL | Status: DC | PRN

## 2020-07-18 MED ORDER — ACETAMINOPHEN 325 MG PO TABS: 325.0000 mg | ORAL_TABLET | ORAL | Status: DC | PRN

## 2020-07-18 MED ORDER — POLYETHYLENE GLYCOL 3350 17 G PO PACK: 17.0000 g | PACK | Freq: Every day | ORAL | Status: DC | PRN

## 2020-07-18 MED ORDER — NYSTATIN 100000 UNIT/ML MT SUSP
5.0000 mL | Freq: Four times a day (QID) | OROMUCOSAL | Status: DC
  Administered 2020-07-19 – 2020-07-23 (×18): 500000 [IU] via ORAL
  Filled 2020-07-18 (×18): qty 5

## 2020-07-18 MED ORDER — PROCHLORPERAZINE EDISYLATE 10 MG/2ML IJ SOLN: 5.0000 mg | Freq: Four times a day (QID) | INTRAMUSCULAR | Status: DC | PRN

## 2020-07-18 MED ORDER — APIXABAN 5 MG PO TABS: 5.0000 mg | ORAL_TABLET | Freq: Two times a day (BID) | ORAL | Status: DC

## 2020-07-18 MED ORDER — INSULIN ASPART 100 UNIT/ML ~~LOC~~ SOLN: 0.0000 [IU] | Freq: Every day | SUBCUTANEOUS | Status: DC

## 2020-07-18 MED ORDER — SENNOSIDES-DOCUSATE SODIUM 8.6-50 MG PO TABS: 1.0000 | ORAL_TABLET | Freq: Every evening | ORAL | Status: DC | PRN

## 2020-07-18 MED ORDER — ATORVASTATIN CALCIUM 80 MG PO TABS
80.0000 mg | ORAL_TABLET | Freq: Every day | ORAL | Status: DC
  Administered 2020-07-19 – 2020-07-23 (×5): 80 mg via ORAL
  Filled 2020-07-18 (×5): qty 1

## 2020-07-18 MED ORDER — PROCHLORPERAZINE 25 MG RE SUPP: 12.5000 mg | Freq: Four times a day (QID) | RECTAL | Status: DC | PRN

## 2020-07-18 MED ORDER — NEPRO/CARBSTEADY PO LIQD
237.0000 mL | Freq: Three times a day (TID) | ORAL | Status: DC
  Administered 2020-07-19 – 2020-07-22 (×8): 237 mL via ORAL

## 2020-07-18 MED ORDER — LOSARTAN POTASSIUM 50 MG PO TABS
50.0000 mg | ORAL_TABLET | Freq: Every day | ORAL | Status: DC
  Administered 2020-07-19 – 2020-07-21 (×2): 50 mg via ORAL
  Filled 2020-07-18 (×3): qty 1

## 2020-07-18 MED ORDER — INSULIN GLARGINE 100 UNIT/ML ~~LOC~~ SOLN
15.0000 [IU] | Freq: Two times a day (BID) | SUBCUTANEOUS | Status: DC
  Administered 2020-07-18 – 2020-07-19 (×2): 15 [IU] via SUBCUTANEOUS
  Filled 2020-07-18 (×3): qty 0.15

## 2020-07-18 MED ORDER — APIXABAN 5 MG PO TABS
5.0000 mg | ORAL_TABLET | Freq: Two times a day (BID) | ORAL | Status: DC
  Administered 2020-07-18 – 2020-07-23 (×10): 5 mg via ORAL
  Filled 2020-07-18 (×10): qty 1

## 2020-07-18 MED ORDER — INSULIN ASPART 100 UNIT/ML ~~LOC~~ SOLN: 0.0000 [IU] | Freq: Three times a day (TID) | SUBCUTANEOUS | Status: DC

## 2020-07-18 MED ORDER — PROCHLORPERAZINE MALEATE 5 MG PO TABS: 5.0000 mg | ORAL_TABLET | Freq: Four times a day (QID) | ORAL | Status: DC | PRN

## 2020-07-18 MED ORDER — AMITRIPTYLINE HCL 25 MG PO TABS
25.0000 mg | ORAL_TABLET | Freq: Every day | ORAL | Status: DC
  Administered 2020-07-18 – 2020-07-22 (×5): 25 mg via ORAL
  Filled 2020-07-18 (×5): qty 1

## 2020-07-18 MED ORDER — RENA-VITE PO TABS
1.0000 | ORAL_TABLET | Freq: Every day | ORAL | Status: DC
  Administered 2020-07-18 – 2020-07-22 (×5): 1 via ORAL
  Filled 2020-07-18 (×5): qty 1

## 2020-07-18 MED ORDER — INSULIN GLARGINE 100 UNIT/ML ~~LOC~~ SOLN
15.0000 [IU] | Freq: Two times a day (BID) | SUBCUTANEOUS | Status: DC
  Filled 2020-07-18: qty 0.15

## 2020-07-18 MED ORDER — GUAIFENESIN-DM 100-10 MG/5ML PO SYRP: 5.0000 mL | ORAL_SOLUTION | Freq: Four times a day (QID) | ORAL | Status: DC | PRN

## 2020-07-18 MED ORDER — INSULIN ASPART 100 UNIT/ML ~~LOC~~ SOLN
0.0000 [IU] | Freq: Every day | SUBCUTANEOUS | Status: DC
  Administered 2020-07-18 – 2020-07-20 (×3): 2 [IU] via SUBCUTANEOUS
  Administered 2020-07-21: 3 [IU] via SUBCUTANEOUS
  Administered 2020-07-22: 2 [IU] via SUBCUTANEOUS

## 2020-07-18 MED ORDER — AMLODIPINE BESYLATE 10 MG PO TABS
10.0000 mg | ORAL_TABLET | Freq: Every day | ORAL | Status: DC
  Administered 2020-07-19 – 2020-07-23 (×4): 10 mg via ORAL
  Filled 2020-07-18 (×5): qty 1

## 2020-07-18 NOTE — Progress Notes (Signed)
Inpatient Rehabilitation  Patient information reviewed and entered into eRehab system by Kinisha Soper M. Loraina Stauffer, M.A., CCC/SLP, PPS Coordinator.  Information including medical coding, functional ability and quality indicators will be reviewed and updated through discharge.    

## 2020-07-18 NOTE — Plan of Care (Signed)
  Problem: Education: Goal: Knowledge of disease or condition will improve Outcome: Progressing Goal: Knowledge of secondary prevention will improve Outcome: Progressing Goal: Knowledge of patient specific risk factors addressed and post discharge goals established will improve Outcome: Progressing   Problem: Coping: Goal: Will verbalize positive feelings about self Outcome: Progressing Goal: Will identify appropriate support needs Outcome: Progressing   Problem: Health Behavior/Discharge Planning: Goal: Ability to manage health-related needs will improve Outcome: Progressing   Problem: Self-Care: Goal: Ability to participate in self-care as condition permits will improve Outcome: Progressing Goal: Verbalization of feelings and concerns over difficulty with self-care will improve Outcome: Progressing Goal: Ability to communicate needs accurately will improve Outcome: Progressing   Problem: Nutrition: Goal: Risk of aspiration will decrease Outcome: Progressing Goal: Dietary intake will improve Outcome: Progressing   Problem: Ischemic Stroke/TIA Tissue Perfusion: Goal: Complications of ischemic stroke/TIA will be minimized Outcome: Progressing

## 2020-07-18 NOTE — Progress Notes (Signed)
Nephrology Follow-Up Consult note     Interval History/Subjective:  Seen and examined on dialysis.  Blood pressure 163/66 and HR 46 left AVF.  Tolerating goal.  She offers no additional hx - still with aphasia  Review of systems: limited by aphasia   Medications:  Current Facility-Administered Medications  Medication Dose Route Frequency Provider Last Rate Last Admin  . acetaminophen (TYLENOL) tablet 650 mg  650 mg Oral Q4H PRN Bailey-Modzik, Delila A, NP   650 mg at 07/14/20 2200   Or  . acetaminophen (TYLENOL) 160 MG/5ML solution 650 mg  650 mg Per Tube Q4H PRN Bailey-Modzik, Delila A, NP       Or  . acetaminophen (TYLENOL) suppository 650 mg  650 mg Rectal Q4H PRN Bailey-Modzik, Delila A, NP   650 mg at 07/11/20 1723  . amitriptyline (ELAVIL) tablet 25 mg  25 mg Oral QHS Bailey-Modzik, Delila A, NP   25 mg at 07/17/20 2136  . amLODipine (NORVASC) tablet 10 mg  10 mg Oral Daily [name], MD   10 mg at 07/18/20 0940  . apixaban (ELIQUIS) tablet 5 mg  5 mg Oral BID Skeet Simmer, RPH   5 mg at 07/18/20 0940  . atorvastatin (LIPITOR) tablet 80 mg  80 mg Oral Daily Bailey-Modzik, Delila A, NP   80 mg at 07/18/20 0940  . Chlorhexidine Gluconate Cloth 2 % PADS 6 each  6 each Topical Daily Bailey-Modzik, Delila A, NP   6 each at 07/17/20 0856  . Chlorhexidine Gluconate Cloth 2 % PADS 6 each  6 each Topical Q0600 Claudia Desanctis, MD   6 each at 07/17/20 0503  . Chlorhexidine Gluconate Cloth 2 % PADS 6 each  6 each Topical Q0600 Claudia Desanctis, MD   6 each at 07/18/20 0600  . cinacalcet (SENSIPAR) tablet 60 mg  60 mg Oral Q breakfast Bailey-Modzik, Delila A, NP   60 mg at 07/18/20 0947  . feeding supplement (NEPRO CARB STEADY) liquid 237 mL  237 mL Oral TID BM Bailey-Modzik, Delila A, NP 0 mL/hr at 07/17/20 0806 237 mL at 07/17/20 2028  . insulin aspart (novoLOG) injection 0-5 Units  0-5 Units Subcutaneous QHS [name], MD   4 Units at 07/17/20 2137  . insulin aspart (novoLOG) injection  0-9 Units  0-9 Units Subcutaneous TID WC [name], MD   3 Units at 07/18/20 0701  . insulin glargine (LANTUS) injection 15 Units  15 Units Subcutaneous BID [name], MD   15 Units at 07/18/20 [phone]  . levothyroxine (SYNTHROID) tablet 75 mcg  75 mcg Oral QAC breakfast [name], MD   75 mcg at 07/18/20 0600  . losartan (COZAAR) tablet 50 mg  50 mg Oral Daily [name], MD   50 mg at 07/18/20 0940  . MEDLINE mouth rinse  15 mL Mouth Rinse BID Bailey-Modzik, Delila A, NP   15 mL at 07/18/20 0941  . multivitamin (RENA-VIT) tablet 1 tablet  1 tablet Oral QHS Bailey-Modzik, Delila A, NP   1 tablet at 07/17/20 2136  . nystatin (MYCOSTATIN) 100000 UNIT/ML suspension 500,000 Units  5 mL Oral QID [name], MD   500,000 Units at 07/18/20 0940  . senna-docusate (Senokot-S) tablet 1 tablet  1 tablet Oral QHS PRN Bailey-Modzik, Delila A, NP      . sevelamer carbonate (RENVELA) tablet 800 mg  800 mg Oral TID WC Claudia Desanctis, MD   800 mg at 07/18/20 0940    Physical Exam: Vitals:  07/18/20 1200 07/18/20 1230  BP: (!) 152/68 (!) 163/66  Pulse:    Resp:    Temp:    SpO2:     No intake/output data recorded. No intake or output data in the 24 hours ending 07/18/20 1249 General adult female in bed in no acute distress  HEENT normocephalic atraumatic Neck supple trachea midline Lungs clear to auscultation bilaterally normal work of breathing at rest  Heart S1S2 no rub Abdomen soft nontender nondistended; obese habitus Extremities no pitting edema  Psych no anxiety or agitation  Neuro aphasia/word finding difficulty - does not speak but nods/shakes head Access AVF LUE in use    Test Results I personally reviewed new and old clinical labs and radiology tests Lab Results  Component Value Date   NA 132 (L) 07/18/2020   K 4.5 07/18/2020   CL 93 (L) 07/18/2020   CO2 25 07/18/2020   BUN 44 (H) 07/18/2020   CREATININE 9.38 (H) 07/18/2020   CALCIUM 8.7 (L) 07/18/2020   ALBUMIN 3.6  07/12/2020   PHOS 4.4 07/12/2020    Outpt Records: NEPHROLOGIST: Cay Schillings MD  LOCATION: [facility]  SCHEDULE: T-Th-S 1st Shift  EDW: 273 kg.  KIDNEY: [facility] F200N  LITERS PROC: 73.5 liters/treatment  HD TIME: 210  ACCESS: R IJ PC  NEEDLE SIZE:  ANTICOAG: Standard Heparin Per Protocol  BATH: 3.0CA-HCO3  QB: 350 ml/min  QD: 700 ml/min   Assessment/Recommendations: Rachael Herrera is a/an 60 y.o. female with a past medical history significant for ESRD, admitted for CVA after dialysis.     ESRD: - HD per TTS schedule while here  Hyperkalemia: resolved with Lokelma and HD  Hypertension/volume: optimize volume with HD  Anemia: hgb acceptable; defer ESA setting of recent CVA  Secondary hyperparathyroidism: Would continue Sensipar 60 mg daily  DM2: mgmt per primary  CVA: s/p thrombectomy. Mgmt per primary  Bradycardia - noted. Confirmed not on beta blocker.  Per primary team   Claudia Desanctis, MD 07/18/2020 12:52 PM

## 2020-07-18 NOTE — Progress Notes (Signed)
Patient admitted to [phone]. Two nurse skin assessment performed upon admission. No complaints of any pain.

## 2020-07-18 NOTE — Progress Notes (Signed)
Kirsteins, Luanna Salk, MD  Physician  Physical Medicine and Rehabilitation  Consult Note     Signed  Date of Service:  07/15/2020 5:23 AM      Related encounter: ED to Hosp-Admission (Current) from 07/11/2020 in [place] 3W Progressive [facility]       Signed      Expand All Collapse All     Show:Clear all '[x]'$ Manual'[x]'$ Template'[]'$ Copied  Added by: '[x]'$ Angiulli, Lavon Paganini, PA-C'[x]'$ Kirsteins, Luanna Salk, MD   '[]'$ Hover for details       Physical Medicine and Rehabilitation Consult Reason for Consult: Right side weakness and gaze preference as well as inability to communicate Referring Physician: Dr.Xu   HPI: ZURRI OBRYAN is a 59 y.o. right-handed female with history of hypertension as well as hyperlipidemia, end-stage renal disease with hemodialysis type 2 diabetes mellitus, NSVT /PVCs and bradycardia OSA, morbid obesity BMI 39.95.  Presented 07/11/2020 with right side weakness gaze preference and inability to communicate.  BP in the 170s.  Of note she was seen 07/09/2020 in the ED at [facility] for another episode that occurred during dialysis as well as bouts of bradycardia.  CT the head was suggested but she left without this test.  Patient had been followed in the past for bradycardia consider plans for pacemaker.  Cranial CT scan showed acute left occipital lobe infarction.  Suspect acute infarction of the body of the left caudate.  CTA showed no large vessel occlusion or hemodynamically significant stenosis in the neck.  Nonocclusive thrombus at the left MCA bifurcation.  Occlusion of proximal left M3 MCA branch.  Patient underwent successful thrombectomy per interventional radiology for M2 occlusion.  Echocardiogram with ejection fraction of 55 to 60% no wall motion abnormalities.  Latest MRI imaging showed acute ischemic infarct within the left MCA and PCA territories with a small amount of petechial blood products in left basal ganglia.  No midline shift or  mass-effect as of 07/12/2020.  Patient is currently maintained on intravenous heparin and cardiology continues to follow for patient's history of bradycardia as well as a flutter.  She is currently on a dysphagia #2 thin liquid diet.  Hemodialysis ongoing as per renal services.  Therapy evaluations completed due to patient's right side weakness and inability to communicate recommendations for physical medicine rehab consult.  According to daughter would like to go to [facility] for her rehabilitation Patient is aphasic cannot express her needs  Review of Systems  Unable to perform ROS: Acuity of condition   History reviewed. No pertinent past medical history.      Past Surgical History:  Procedure Laterality Date   IR PERCUTANEOUS ART THROMBECTOMY/INFUSION INTRACRANIAL INC DIAG ANGIO  07/11/2020   IR US GUIDE VASC ACCESS LEFT  07/12/2020   RADIOLOGY WITH ANESTHESIA N/A 07/11/2020   Procedure: IR WITH ANESTHESIA;  Surgeon: Radiologist, Medication, MD;  Location: [place];  Service: Radiology;  Laterality: N/A;   History reviewed. No pertinent family history. Social History:  has no history on file for tobacco use, alcohol use, and drug use. Allergies: No Known Allergies       Medications Prior to Admission  Medication Sig Dispense Refill   amitriptyline (ELAVIL) 25 MG tablet Take 25 mg by mouth at bedtime.     amLODipine (NORVASC) 10 MG tablet Take 10 mg by mouth daily.     atorvastatin (LIPITOR) 80 MG tablet Take 80 mg by mouth every evening.     celecoxib (CELEBREX) 100 MG capsule Take 100 mg by  mouth daily.     cholecalciferol (VITAMIN D3) 25 MCG (1000 UNIT) tablet Take 1,000 Units by mouth daily.     cinacalcet (SENSIPAR) 60 MG tablet Take 60 mg by mouth daily.     gabapentin (NEURONTIN) 300 MG capsule Take 300 mg by mouth daily.     HUMALOG 100 UNIT/ML injection Inject 3-13 Units into the skin 3 (three) times daily before meals. Per sliding scale      insulin glargine (LANTUS) 100 UNIT/ML Solostar Pen Inject 80 Units into the skin at bedtime.     losartan (COZAAR) 50 MG tablet Take 50 mg by mouth daily.     sevelamer carbonate (RENVELA) 800 MG tablet Take 800 mg by mouth 3 (three) times daily.     SYNTHROID 75 MCG tablet Take 75 mcg by mouth daily.     torsemide (DEMADEX) 20 MG tablet Take 20 mg by mouth daily.     vitamin B-12 (CYANOCOBALAMIN) 1000 MCG tablet Take 2,000 mcg by mouth daily.     vitamin E 180 MG (400 UNITS) capsule Take 400 Units by mouth daily.      Home: Home Living Family/patient expects to be discharged to:: Unsure Additional Comments: pt unable to assist with history due to aphasia, no family present  Functional History: Prior Function Level of Independence: Independent Comments: pt driving herself to dialysis prior to admission Functional Status:  Mobility: Bed Mobility Overal bed mobility: Needs Assistance Bed Mobility: Supine to Sit,Sit to Supine Rolling: Min assist Supine to sit: Mod assist Sit to supine: HOB elevated,Mod assist General bed mobility comments: modA to complete movement of BLE and elevate trunk from HOB. pt with good use of LUE to pull up. able to initiate return to supine, but benefits from minA to BLE to clear over edge of bed Transfers Overall transfer level: Needs assistance Equipment used: Rolling walker (2 wheeled) Transfers: Sit to/from Stand Sit to Stand: Mod assist,From elevated surface Stand pivot transfers: +2 physical assistance,Mod assist General transfer comment: modA with cues for hand positioning, use of momentum, and use of RW to power up from EOB to standing. completed x10 through session with assist to raise RUE to RW grip, then minA at times to assist with pt maintaining grip Ambulation/Gait Ambulation/Gait assistance: Mod assist,+2 physical assistance Gait Distance (Feet): 3 Feet (x2) Assistive device: Rolling walker (2 wheeled) Gait  Pattern/deviations: Step-to pattern,Decreased stride length General Gait Details: pt with small lateral steps along EOB. benefits from facilitation of wt shift at hips to allow for clearance of LLE for each step. Gait velocity: decreased  ADL: ADL Overall ADL's : Needs assistance/impaired Eating/Feeding: Moderate assistance Eating/Feeding Details (indicate cue type and reason): spilling cup tryign to pick it up Grooming: Wash/dry face,Minimal assistance,Sitting Grooming Details (indicate cue type and reason): only washing L side of face. pt with wash cloth placed on face to see if pt would remove it due to communication deficits. pt shaking head to make it fall instead of using either arm Upper Body Bathing: Maximal assistance Lower Body Bathing: Moderate assistance Upper Body Dressing : Moderate assistance Lower Body Dressing: Moderate assistance Toilet Transfer: +2 for physical assistance,Moderate assistance,Stand-pivot Toilet Transfer Details (indicate cue type and reason): simulate EOB to chair General ADL Comments: pt motivated by [employer]. pt with x4 solitaire games loaded on ipad.  Cognition: Cognition Overall Cognitive Status: Impaired/Different from baseline Arousal/Alertness: Awake/alert Orientation Level: Other (comment) Attention: Sustained Sustained Attention: Impaired Sustained Attention Impairment: Verbal basic,Functional basic Memory:  (TBA) Awareness: Impaired  Awareness Impairment: Emergent impairment Problem Solving: Impaired Problem Solving Impairment: Functional basic Behaviors: Impulsive Safety/Judgment: Impaired Cognition Arousal/Alertness: Lethargic,Awake/alert (initially sleeping, but increased alertness easily) Behavior During Therapy: Impulsive Overall Cognitive Status: Impaired/Different from baseline Area of Impairment: Following commands,Safety/judgement,Awareness Following Commands: Follows one step commands inconsistently,Follows one step  commands with increased time Safety/Judgement: Decreased awareness of safety,Decreased awareness of deficits Awareness: Intellectual General Comments: Pt nodding at times or using facial expressions such as smiling or eye roll to communicate through session. Pt eaer to move, able to nod affirmative after being told to wait and given instructions for movement.  Blood pressure (!) 150/65, pulse 70, temperature 98.9 F (37.2 C), temperature source Oral, resp. rate 16, height '5\' 7"'$  (1.702 m), weight (P) 116.8 kg, SpO2 96 %. Physical Exam Vitals and nursing note reviewed.  Constitutional:      Appearance: She is obese.  HENT:     Head: Normocephalic and atraumatic.  Eyes:     Extraocular Movements: Extraocular movements intact.     Conjunctiva/sclera: Conjunctivae normal.     Pupils: Pupils are equal, round, and reactive to light.  Cardiovascular:     Rate and Rhythm: Normal rate and regular rhythm.     Heart sounds: No murmur heard.   Pulmonary:     Effort: Pulmonary effort is normal. No respiratory distress.     Breath sounds: Normal breath sounds. No stridor. No wheezing.  Abdominal:     General: Abdomen is flat. Bowel sounds are normal. There is no distension.     Palpations: Abdomen is soft.     Tenderness: There is no abdominal tenderness.  Musculoskeletal:     Comments: No pain with upper extremity or lower extremity range of motion no evidence of joint swelling in the knees fingers or wrists  Skin:    General: Skin is warm and dry.  Neurological:     Mental Status: She is alert and oriented to person, place, and time.     Comments: Patient is alert.  Severely aphasic.  She did not follow verbal or motor commands.  Examination overall limited due to aphasia.  Motor strength is 4/5 in the right deltoid, bicep, tricep, grip, hip flexor, knee extensor, ankle dorsiflexor and plantar flexor 5/5 in the left deltoid, bicep, tricep, grip, hip flexor knee extensor ankle dorsiflexion  plantarflexion Manual muscle testing is accomplished with gestural cues patient is unable to follow verbal cues including differentiating right versus left. Sensation could not be assessed secondary to aphasia. Tone is normal in bilateral upper and lower extremities  Psychiatric:        Mood and Affect: Mood normal.        Behavior: Behavior normal.     Lab Results Last 24 Hours       Results for orders placed or performed during the hospital encounter of 07/11/20 (from the past 24 hour(s))  Glucose, capillary     Status: Abnormal   Collection Time: 07/14/20  7:56 AM  Result Value Ref Range   Glucose-Capillary 185 (H) 70 - 99 mg/dL  Heparin level (unfractionated)     Status: Abnormal   Collection Time: 07/14/20 10:39 AM  Result Value Ref Range   Heparin Unfractionated 0.22 (L) 0.30 - 0.70 IU/mL  Glucose, capillary     Status: Abnormal   Collection Time: 07/14/20 12:02 PM  Result Value Ref Range   Glucose-Capillary 240 (H) 70 - 99 mg/dL  Hepatitis B surface antigen     Status: None   Collection  Time: 07/14/20  5:20 PM  Result Value Ref Range   Hepatitis B Surface Ag NON REACTIVE NON REACTIVE  Hepatitis B surface antibody,qualitative     Status: Abnormal   Collection Time: 07/14/20  5:21 PM  Result Value Ref Range   Hep B S Ab Reactive (A) NON REACTIVE  Hepatitis B core antibody, total     Status: None   Collection Time: 07/14/20  5:21 PM  Result Value Ref Range   Hep B Core Total Ab NON REACTIVE NON REACTIVE  Glucose, capillary     Status: Abnormal   Collection Time: 07/14/20  8:16 PM  Result Value Ref Range   Glucose-Capillary 272 (H) 70 - 99 mg/dL  Heparin level (unfractionated)     Status: Abnormal   Collection Time: 07/14/20  8:42 PM  Result Value Ref Range   Heparin Unfractionated 0.86 (H) 0.30 - 0.70 IU/mL  Glucose, capillary     Status: Abnormal   Collection Time: 07/14/20 11:59 PM  Result Value Ref Range   Glucose-Capillary 460 (H) 70 - 99  mg/dL  Glucose, capillary     Status: Abnormal   Collection Time: 07/15/20  4:15 AM  Result Value Ref Range   Glucose-Capillary 310 (H) 70 - 99 mg/dL     Imaging Results (Last 48 hours)  No results found.     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 Does the need for close, 24 hr/day medical supervision in concert with the patient's rehab needs make it unreasonable for this patient to be served in a less intensive setting? Yes 2. Co-Morbidities requiring supervision/potential complications: End-stage renal disease on hemodialysis, type 2 diabetes uncontrolled, morbid obesity, hypertension uncontrolled 3. Due to bladder management, bowel management, safety, skin/wound care, disease management, medication administration, pain management and patient education, does the patient require 24 hr/day rehab nursing? Yes 4. Does the patient require coordinated care of a physician, rehab nurse, therapy disciplines of PT, OT, speech to address physical and functional deficits in the context of the above medical diagnosis(es)? Yes Addressing deficits in the following areas: balance, endurance, locomotion, strength, transferring, bowel/bladder control, bathing, dressing, toileting, cognition, speech, language and psychosocial support 5. Can the patient actively participate in an intensive therapy program of at least 3 hrs of therapy per day at least 5 days per week? Yes 6. The potential for patient to make measurable gains while on inpatient rehab is good 7. Anticipated functional outcomes upon discharge from inpatient rehab are supervision  with PT, supervision with OT, supervision with SLP. 8. Estimated rehab length of stay to reach the above functional goals is: 16 to 21 days 9. Anticipated discharge destination: Home 10. Overall Rehab/Functional Prognosis: good  RECOMMENDATIONS: This patient's  condition is appropriate for continued rehabilitative care in the following setting: CIR Patient has agreed to participate in recommended program. Yes Note that insurance prior authorization may be required for reimbursement for recommended care.  Comment: Patient/family would like to have rehabilitation in [place], [place] 07/15/2020   "I have personally performed a face to face diagnostic evaluation of this patient.  Additionally, I have reviewed and concur with the physician assistant's documentation above." Charlett Blake M.D. [place] Fellow Am Acad of [employer] Med and Rehab Diplomate Am Board of Electrodiagnostic Med Fellow Am Board of Interventional Pain            Revision History  Note Details  Author Charlett Blake, MD File Time 07/15/2020 1:43 PM  Author Type Physician Status Signed  Last Editor Charlett Blake, MD Service Physical Medicine and Rehabilitation

## 2020-07-18 NOTE — TOC Transition Note (Signed)
Transition of Care [facility]) - CM/SW Discharge Note   Patient Details  Name: KAROLINA OCEAN MRN: [id number] Date of Birth: Aug 06, 1960  [facility]) CM/SW Contact:  Pollie Friar, RN Phone Number: 07/18/2020, 11:34 AM   Clinical Narrative:    Pt is discharging to CIR today. TOC signing off.    Final next level of care: IP Rehab Facility Barriers to Discharge: No Barriers Identified   Patient Goals and CMS Choice   CMS Medicare.gov Compare Post Acute Care list provided to:: Patient Represenative (must comment) Choice offered to / list presented to : Adult Children  Discharge Placement                       Discharge Plan and Services   Discharge Planning Services: CM Consult Post Acute Care Choice: IP Rehab                               Social Determinants of Health (SDOH) Interventions     Readmission Risk Interventions No flowsheet data found.

## 2020-07-18 NOTE — Progress Notes (Signed)
PT Cancellation Note  Patient Details Name: Rachael Herrera MRN: [id number] DOB: 1960-10-01   Cancelled Treatment:    Reason Eval/Treat Not Completed: Patient at procedure or test/unavailable. Pt at HD treatment at time of attempted session and likely to d/c today. Will follow-up as able.  Moishe Spice, PT, DPT Acute Rehabilitation Services  Pager: [phone] Office: [place] 07/18/2020, 1:46 PM

## 2020-07-18 NOTE — Progress Notes (Signed)
Retta Diones, RN  Rehab Admission Coordinator  Physical Medicine and Rehabilitation  PMR Pre-admission     Signed  Date of Service:  07/17/2020 5:31 PM      Related encounter: ED to Hosp-Admission (Current) from 07/11/2020 in [facility]       Signed          Show:Clear all [x] Manual[x] Template[x] Copied  Added by: [x] Retta Diones, RN[x] Merlyn Albert Farley Ly, CCC-SLP   [] Hover for details  PMR Admission Coordinator Pre-Admission Assessment  Patient: Rachael Herrera is an 60 y.o., female MRN: [id number] DOB: [birth year] Height: 5\' 7"  (170.2 cm) Weight: 117.5 kg                                                                                                                                                  Insurance Information HMO:    PPO: yes     PCP:      IPA:      80/20:      OTHER:  PRIMARY: UHC Medicare      Policy#: [id number]      Subscriber: Pt CM Name: Rachael Herrera (Auth done for [facility] CIR to begin with, then transferred to [facility] CIR)     Phone#: [phone]    Fax#: [phone] Pre-Cert#: [id number] for 7 days with update due 07/24/20      Employer: Not employed Benefits:  Phone #: [phone]     Name: Checked in online portal Eff. Date: 05/25/20     Deduct: $0      Out of Pocket Max: [phone] (met $0)      Life Max: N/A  CIR: $1480 admission copay      SNF: $0 copay for days 1-20; $194.50 for days 21-100 Outpatient: 100%     Co-Pay: none Home Health: 100%      Co-Pay: none DME: 80%     Co-Pay: 20% Providers: in network  SECONDARY: Medicaid of [place] with coverage code [facility] and is eligible on 5/46/27     Policy#: [id number]      Phone#: [phone]  Financial Counselor:        Phone#:    The "Data Collection Information Summary" for patients in Inpatient Rehabilitation Facilities with attached "Privacy Act [place] Records" was provided and verbally reviewed with: Patient  Emergency Contact Information         Contact  Information    Name Relation Home Work Mobile   Rachael Herrera Daughter   [phone]   Jamison Oka Sister   [phone]     Current Medical History  Patient Admitting Diagnosis: CVA  History of Present Illness: Shallyn V Ellisonis a 60 y.o.right-handed femalewith history of hypertension as well as hyperlipidemia, end-stage renal disease with hemodialysis type 2 diabetes mellitus, NSVT /PVCs and bradycardia OSA, morbid obesity BMI 39.95. Presented 07/11/2020 with right side weakness  gaze preference and inability to communicate. BP in the 170s. Of note she was seen 07/09/2020 in the ED at [facility] for another episode that occurred during dialysis as well as bouts of bradycardia. CT the head was suggested but she left without this test. Patient had been followed in the past for bradycardia consider plans for pacemaker. Cranial CT scan showed acute left occipital lobe infarction. Suspect acute infarction of the body of the left caudate. CTA showed no large vessel occlusion or hemodynamically significant stenosis in the neck. Nonocclusive thrombus at the left MCA bifurcation. Occlusion of proximal left M3 MCA branch. Patient underwent successful thrombectomy per interventional radiology for M2 occlusion. Echocardiogram with ejection fraction of 55 to 60% no wall motion abnormalities. Latest MRI imaging showed acute ischemic infarct within the left MCA and PCA territories with a small amount of petechial blood products in left basal ganglia. No midline shift or mass-effect as of 07/12/2020. Patient is currently maintained on intravenous heparin and cardiology continues to follow for patient's history of bradycardia as well as a flutter. She is currently on a dysphagia #2 thin liquid diet. Hemodialysis ongoing as per renal services. Therapy evaluations completed due to patient's right side weakness and inability to communicate recommendations for physical medicine rehab  consult.  Complete NIHSS TOTAL: 12 Glasgow Coma Scale Score: 14  Past Medical History  History reviewed. No pertinent past medical history.  Family History  family history is not on file.  Prior Rehab/Hospitalizations:  Has the patient had prior rehab or hospitalizations prior to admission? Yes  Has the patient had major surgery during 100 days prior to admission? Yes  Current Medications   Current Facility-Administered Medications:  .  acetaminophen (TYLENOL) tablet 650 mg, 650 mg, Oral, Q4H PRN, 650 mg at 07/14/20 2200 **OR** acetaminophen (TYLENOL) 160 MG/5ML solution 650 mg, 650 mg, Per Tube, Q4H PRN **OR** acetaminophen (TYLENOL) suppository 650 mg, 650 mg, Rectal, Q4H PRN, Bailey-Modzik, Delila A, NP, 650 mg at 07/11/20 1723 .  amitriptyline (ELAVIL) tablet 25 mg, 25 mg, Oral, QHS, Bailey-Modzik, Delila A, NP, 25 mg at 07/17/20 2136 .  amLODipine (NORVASC) tablet 10 mg, 10 mg, Oral, Daily, [name], MD, 10 mg at 07/18/20 0940 .  apixaban (ELIQUIS) tablet 5 mg, 5 mg, Oral, BID, Skeet Simmer, RPH, 5 mg at 07/18/20 0940 .  atorvastatin (LIPITOR) tablet 80 mg, 80 mg, Oral, Daily, Bailey-Modzik, Delila A, NP, 80 mg at 07/18/20 0940 .  Chlorhexidine Gluconate Cloth 2 % PADS 6 each, 6 each, Topical, Daily, Bailey-Modzik, Delila A, NP, 6 each at 07/17/20 0856 .  Chlorhexidine Gluconate Cloth 2 % PADS 6 each, 6 each, Topical, Q0600, Claudia Desanctis, MD, 6 each at 07/17/20 0503 .  Chlorhexidine Gluconate Cloth 2 % PADS 6 each, 6 each, Topical, Q0600, Claudia Desanctis, MD, 6 each at 07/18/20 0600 .  cinacalcet (SENSIPAR) tablet 60 mg, 60 mg, Oral, Q breakfast, Bailey-Modzik, Delila A, NP, 60 mg at 07/18/20 0947 .  feeding supplement (NEPRO CARB STEADY) liquid 237 mL, 237 mL, Oral, TID BM, Bailey-Modzik, Delila A, NP, Last Rate: 0 mL/hr at 07/17/20 0806, 237 mL at 07/17/20 2028 .  insulin aspart (novoLOG) injection 0-5 Units, 0-5 Units, Subcutaneous, QHS, [name], MD, 4 Units at  07/17/20 2137 .  insulin aspart (novoLOG) injection 0-9 Units, 0-9 Units, Subcutaneous, TID WC, [name], MD, 3 Units at 07/18/20 0701 .  insulin glargine (LANTUS) injection 15 Units, 15 Units, Subcutaneous, BID, [name], MD, 15  Units at 07/18/20 0947 .  levothyroxine (SYNTHROID) tablet 75 mcg, 75 mcg, Oral, QAC breakfast, [name], MD, 75 mcg at 07/18/20 0600 .  losartan (COZAAR) tablet 50 mg, 50 mg, Oral, Daily, [name], MD, 50 mg at 07/18/20 0940 .  MEDLINE mouth rinse, 15 mL, Mouth Rinse, BID, Bailey-Modzik, Delila A, NP, 15 mL at 07/18/20 0941 .  multivitamin (RENA-VIT) tablet 1 tablet, 1 tablet, Oral, QHS, Bailey-Modzik, Delila A, NP, 1 tablet at 07/17/20 2136 .  nystatin (MYCOSTATIN) 100000 UNIT/ML suspension 500,000 Units, 5 mL, Oral, QID, [name], MD, 500,000 Units at 07/18/20 0940 .  senna-docusate (Senokot-S) tablet 1 tablet, 1 tablet, Oral, QHS PRN, Bailey-Modzik, Delila A, NP .  sevelamer carbonate (RENVELA) tablet 800 mg, 800 mg, Oral, TID WC, Claudia Desanctis, MD, 800 mg at 07/18/20 0940  Patients Current Diet:     Diet Order                  DIET DYS 2 Room service appropriate? No; Fluid consistency: Thin  Diet effective now                  Precautions / Restrictions Precautions Precautions: Fall Restrictions Weight Bearing Restrictions: No   Has the patient had 2 or more falls or a fall with injury in the past year?No  Prior Activity Level Community (5-7x/wk): Pt. was active in the community PTA  Prior Functional Level Prior Function Level of Independence: Independent Comments: pt driving herself to dialysis prior to admission  Self Care: Did the patient need help bathing, dressing, using the toilet or eating?  Independent  Indoor Mobility: Did the patient need assistance with walking from room to room (with or without device)? Independent  Stairs: Did the patient need assistance with internal or external stairs (with or  without device)? Independent  Functional Cognition: Did the patient need help planning regular tasks such as shopping or remembering to take medications? Independent  Home Assistive Devices / Equipment  Prior Device Use: Indicate devices/aids used by the patient prior to current illness, exacerbation or injury? None of the above  Current Functional Level Cognition  Arousal/Alertness: Awake/alert Overall Cognitive Status: Impaired/Different from baseline Orientation Level: Oriented X4 Following Commands: Follows one step commands inconsistently,Follows one step commands with increased time Safety/Judgement: Decreased awareness of safety,Decreased awareness of deficits General Comments: Pt impulsive in starting transitions prior to being cued at times. Needs repeated simple commands to direct pt to attend to R side and maintain safety. Attention: Sustained Sustained Attention: Impaired Sustained Attention Impairment: Verbal basic,Functional basic Memory:  (TBA) Awareness: Impaired Awareness Impairment: Emergent impairment Problem Solving: Impaired Problem Solving Impairment: Functional basic Behaviors: Impulsive Safety/Judgment: Impaired    Extremity Assessment (includes Sensation/Coordination)  Upper Extremity Assessment: Defer to OT evaluation RUE Deficits / Details: AROM 80 shoulder flexion with automatic task and strong encouragement. Pt decreased grasp and using L hand instead of using R UE for task RUE Coordination: decreased fine motor,decreased gross motor  Lower Extremity Assessment: Generalized weakness (pt slow to move BLE, but able to use funtionally for transfer. MMT limited due to impariged cognition)    ADLs  Overall ADL's : Needs assistance/impaired Eating/Feeding: Moderate assistance Eating/Feeding Details (indicate cue type and reason): spilling cup tryign to pick it up Grooming: Wash/dry face,Minimal assistance,Sitting Grooming Details (indicate cue  type and reason): only washing L side of face. pt with wash cloth placed on face to see if pt would remove it due to communication deficits. pt shaking  head to make it fall instead of using either arm Upper Body Bathing: Maximal assistance Lower Body Bathing: Moderate assistance Upper Body Dressing : Moderate assistance Lower Body Dressing: Moderate assistance Toilet Transfer: +2 for physical assistance,Moderate assistance,Stand-pivot Toilet Transfer Details (indicate cue type and reason): simulate EOB to chair General ADL Comments: pt motivated by [employer]. pt with x4 solitaire games loaded on ipad.    Mobility  Overal bed mobility: Needs Assistance Bed Mobility: Supine to Sit Rolling: Min assist Supine to sit: HOB elevated,Min assist Sit to supine: HOB elevated,Mod assist General bed mobility comments: HOB elevated, pt initiating moving legs towards EOB but needing minA for the trunk with HHA pt pulling on PT's hand to sit up EOB.    Transfers  Overall transfer level: Needs assistance Equipment used: Rolling walker (2 wheeled) Transfers: Sit to/from Stand Sit to Stand: From elevated surface,Min assist Stand pivot transfers: +2 safety/equipment,Min assist General transfer comment: Sit to stand 1x from EOB and 4x from recliner with minA for stability and cueing hand-over-hand for pt to use R hand to push up to stand and reach back to sit. MinAx2 for safety with stand step to R.    Ambulation / Gait / Stairs / Wheelchair Mobility  Ambulation/Gait Ambulation/Gait assistance: Min assist,+2 safety/equipment Gait Distance (Feet): 25 Feet Assistive device: Rolling walker (2 wheeled) Gait Pattern/deviations: Step-through pattern,Decreased step length - right,Decreased stride length General Gait Details: Pt ambulates slowly with some unsteadiness, especially when stepping posteriorly, needing minAx2 for safety. She tends to push RW distal anteriorly and needs physical A to keep it  proximal. Hand-over-hand directing of pt to attend to R hand for proper placement on RW. Gait velocity: decreased Gait velocity interpretation: <1.8 ft/sec, indicate of risk for recurrent falls    Posture / Balance Balance Overall balance assessment: Needs assistance Sitting-balance support: Single extremity supported,Feet supported Sitting balance-Leahy Scale: Fair Standing balance support: Bilateral upper extremity supported,During functional activity Standing balance-Leahy Scale: Poor Standing balance comment: External assistance and UE support on RW.    Special needs/care consideration Skin WDL, Diabetic management On insulin for DM and Special service needs HD T-Th-Sat - in HD today 07/18/20     Previous Home Environment (from acute therapy documentation) Living Arrangements: Other (Comment)  Lives With: Daughter Available Help at Discharge: Family Type of Home: House Home Layout: One level Home Access: Stairs to enter Entrance Stairs-Rails: Can reach both,Right,Left Entrance Stairs-Number of Steps: 8 Bathroom Shower/Tub: Multimedia programmer: Handicapped height Bathroom Accessibility: Yes How Accessible: Accessible via walker [place]: No Additional Comments: pt unable to assist with history due to aphasia, no family present  Discharge Living Setting Plans for Discharge Living Setting: Patient's home Type of Home at Discharge: House Discharge Home Layout: One level Discharge Home Access: Stairs to enter Entrance Stairs-Rails: Blades reach both Entrance Stairs-Number of Steps: 8 Discharge Bathroom Shower/Tub: Walk-in shower Discharge Bathroom Toilet: Handicapped height Discharge Bathroom Accessibility: Yes How Accessible: Accessible via walker  Eva Patient Roles: Other (Comment) Contact Information: [phone] Anticipated Caregiver: Jemimah Cressy Anticipated Caregiver's Contact Information:  [phone] Ability/Limitations of Caregiver: Can provide Min A after a few weeks, but giving birth 2nd week of March Caregiver Availability: 24/7 Discharge Plan Discussed with Primary Caregiver: Yes Is Caregiver In Agreement with Plan?: Yes Does Caregiver/Family have Issues with Lodging/Transportation while Pt is in Rehab?: No  Goals Patient/Family Goal for Rehab: PT/OT/SLP Supervision Expected length of stay: 16-21 days Pt/Family Agrees to Admission and willing to participate: Yes  Program Orientation Provided & Reviewed with Pt/Caregiver Including Roles  & Responsibilities: Yes  Decrease burden of Care through IP rehab admission: Specialzed equipment needs, Decrease number of caregivers, Bowel and bladder program and Patient/family education  Possible need for SNF placement upon discharge: not anticipated  Patient Condition: This patient's medical and functional status has changed since the consult dated: 07/15/20 in which the Rehabilitation Physician determined and documented that the patient's condition is appropriate for intensive rehabilitative care in an inpatient rehabilitation facility. However, rehab MD thought patient was going to [facility].  [facility] did not admit patient due to HR low.  Rehab MD reviewed chart and spoke with acute hospital MD and approved CIR admit.  See "History of Present Illness" (above) for medical update. Functional changes are: Currently requiring min assist to ambulate 25 feet and mod/max assist for ADL tasks. Patient's medical and functional status update has been discussed with the Rehabilitation physician and patient remains appropriate for inpatient rehabilitation. Will admit to inpatient rehab today.  Preadmission Screen Completed By:  Retta Diones, RN, 07/18/2020 12:01 PM ______________________________________________________________________   Discussed status with Dr. Ranell Patrick on 07/18/20 at 61 and received approval for admission  today.  Admission Coordinator:  Retta Diones, time 1332/Date 07/18/20           Cosigned by: Izora Ribas, MD at 07/18/2020 1:48 PM    Revision History                                                 Note Details  Author Retta Diones, RN File Time 07/18/2020 1:33 PM  Author Type Rehab Admission Coordinator Status Signed  Last Editor Retta Diones, RN Service Physical Medicine and Rehabilitation

## 2020-07-18 NOTE — Progress Notes (Signed)
  Speech Language Pathology Treatment: Cognitive-Linquistic (Pt ate breakfast recently; kindly refused POs)  Patient Details Name: Rachael Herrera MRN: [id number] DOB: 1960/07/04 Today's Date: 07/18/2020 Time: [id number] (session decreased d/t pt transferring to dialysis during session) SLP Time Calculation (min) (ACUTE ONLY): 18 min  Assessment / Plan / Recommendation Clinical Impression  SLP utilized melodic intonation therapy, multimodal cues for speech/verbal expression and auditory comprehension tasks.  Reading/augmentative device and functional objects within environment utilized for prompting for simple word repetition with max phonemic/semantic cues provided with 20% accuracy obtained; groping and part-word dysfluencies noted during speech attempts.  Automatic words voiced including "right", "yep", "bye bye" and "nah."  Pt followed simple 1-2 step directives with mod-max multimodal cues provided with 50% gained, but some were oral motor tasks which are impacted by motor planning being decreased overall.  Processing of information decreased as well impacting overall performance within session.  No dysphagia tx completed this session as pt had finished breakfast tray and kindly refused POs this session.  ST recommended to continue when pt is transferred to CIR to continue aphasia/dysphagia tx (imminent d/c today per nursing).  If plans change to transfer pt to CIR, acute ST will continue to provide tx in this setting.    HPI HPI: 60 yr old experienced difficulty speaking and moving the right side. MRI revelaed acute ischemic infarcts within the left MCA and PCA territories with a small amount of petechial blood products in the left basal  ganglia. Underwent emergent thrombectomy of left MCA M2 occlusion and revascularization. Intubated 2/17 during procedure.      SLP Plan  Continue with current plan of care       Recommendations  Diet recommendations: Dysphagia 2 (fine chop) Liquids provided  via: Cup;No straw (straws noted in room when SLP arrived) Medication Administration: Whole meds with puree Supervision: Staff to assist with self feeding;Full supervision/cueing for compensatory strategies Compensations: Minimize environmental distractions;Slow rate;Small sips/bites;Lingual sweep for clearance of pocketing Postural Changes and/or Swallow Maneuvers: Seated upright 90 degrees                General recommendations: Other(comment) (Pt being transferred to CIR) Oral Care Recommendations: Oral care BID Follow up Recommendations: Inpatient Rehab SLP Visit Diagnosis: Dysphagia, unspecified (R13.10);Aphasia (R47.01) Plan: Continue with current plan of care                       Elvina Sidle, M.S., CCC-SLP 07/18/2020, 12:13 PM

## 2020-07-18 NOTE — H&P (Signed)
Physical Medicine and Rehabilitation Admission H&P    CC: Stroke with functional deficits.    HPI: Rachael Herrera is a 60 year old female with history of HTN, T2DM with peripheral neuropathy, remote seizures due to hypoglycemia, Tachybrady syndrome--failed attempts at [facility] 03/2020, OSA, seizure disorder, who was admitted on 07/11/20 with right sided weakness with left gaze prefrence and inability to communicate. Per reports patient had been seen at [facility] after bradycardia with dizziness and left eye blindness and plans for PPM but she left without CT head as was feeling better and elected to wait for PPM on outpatient basis. CTA head showed acute left occipital lobe infarct, suspected left caudate infarct and nonocclusive thrombus at [facility] bifurcation and occlusion of left M2 MCA branch. She underwent cerebral angio with mechanical thrombectomy and TICI 3 revascularization. Follow up MRI brain showed acute ischemic infarcts in L-MCA/PCA territories with small amount of petechial blood in left  basal ganglia. She developed atrial flutter and Dr. Quentin Ore recommended [facility] once cleared by neurology.  She was started on IV heparin on 02/18 and transitioned to eliquis. Episodic hypoglycemia has resolved with initiation of diet. BP treated with cleviprex and medications added back for better control. Heart rate has been stable in 40's for past 24 hours and cardiology reconsulted 02/23--> "no urgent need for pacing and can be visited if interfering with therapy". She continues to have limitations due to global aphasia with right sided weakness with sensory deficits, left gaze preference affecting ADLs and mobility. CIR recommended due to functional decline. She currently has no complaints, receiving HD, moaning.    Review of Systems  Unable to perform ROS: Medical condition    Past Medical History:  Diagnosis Date  . Cavitary pneumonia 06/2017   due to MSSA  . Claudication of both lower extremities ([place])    . DDD (degenerative disc disease), cervical   . DDD (degenerative disc disease), lumbosacral   . Diabetes mellitus ([place])   . Diabetic neuropathy ([place])   . ESRD (end stage renal disease) on dialysis ([place])   . GERD (gastroesophageal reflux disease)   . NSVT (nonsustained ventricular tachycardia) ([place]) 10/20201  . OSA on CPAP   . Ulnar neuropathy    BUE  . Vitamin B 12 deficiency      Past Surgical History:  Procedure Laterality Date  . IR ANGIO VERTEBRAL SEL VERTEBRAL UNI L MOD SED  07/11/2020  . IR CT HEAD LTD  07/11/2020  . IR PERCUTANEOUS ART THROMBECTOMY/INFUSION INTRACRANIAL INC DIAG ANGIO  07/11/2020  . IR US GUIDE VASC ACCESS LEFT  07/12/2020  . RADIOLOGY WITH ANESTHESIA N/A 07/11/2020   Procedure: IR WITH ANESTHESIA;  Surgeon: Radiologist, Medication, MD;  Location: [facility];  Service: Radiology;  Laterality: N/A;    Family History  Problem Relation Age of Onset  . Diabetes Mother   . Heart disease Mother   . Diabetes Father   . Heart disease Father   . Diabetes Sister     Social History: Patient aphasic-- >Single? She used to smoke 1/2 PPDX 27 years and quit in 2019. No records on file for alcohol use, and drug use.    Allergies: No Known Allergies    Medications Prior to Admission  Medication Sig Dispense Refill  . amitriptyline (ELAVIL) 25 MG tablet Take 25 mg by mouth at bedtime.    Marland Kitchen amLODipine (NORVASC) 10 MG tablet Take 10 mg by mouth daily.    Marland Kitchen atorvastatin (LIPITOR) 80 MG tablet Take  80 mg by mouth every evening.    . celecoxib (CELEBREX) 100 MG capsule Take 100 mg by mouth daily.    . cholecalciferol (VITAMIN D3) 25 MCG (1000 UNIT) tablet Take 1,000 Units by mouth daily.    . cinacalcet (SENSIPAR) 60 MG tablet Take 60 mg by mouth daily.    Marland Kitchen gabapentin (NEURONTIN) 300 MG capsule Take 300 mg by mouth daily.    Marland Kitchen HUMALOG 100 UNIT/ML injection Inject 3-13 Units into the skin 3 (three) times daily before meals. Per sliding scale    . insulin glargine (LANTUS)  100 UNIT/ML Solostar Pen Inject 80 Units into the skin at bedtime.    Marland Kitchen losartan (COZAAR) 50 MG tablet Take 50 mg by mouth daily.    . sevelamer carbonate (RENVELA) 800 MG tablet Take 800 mg by mouth 3 (three) times daily.    Marland Kitchen SYNTHROID 75 MCG tablet Take 75 mcg by mouth daily.    Marland Kitchen torsemide (DEMADEX) 20 MG tablet Take 20 mg by mouth daily.    . vitamin B-12 (CYANOCOBALAMIN) 1000 MCG tablet Take 2,000 mcg by mouth daily.    . vitamin E 180 MG (400 UNITS) capsule Take 400 Units by mouth daily.      Drug Regimen Review  Drug regimen was reviewed and remains appropriate with no significant issues identified  Home: Home Living Family/patient expects to be discharged to:: Unsure Living Arrangements: Other (Comment) Available Help at Discharge: Family Type of Home: House Home Access: Stairs to enter Technical brewer of Steps: 8 Entrance Stairs-Rails: Can reach both,Right,Left Home Layout: One level Bathroom Shower/Tub: Multimedia programmer: Handicapped height Bathroom Accessibility: Yes Additional Comments: pt unable to assist with history due to aphasia, no family present  Lives With: Daughter   Functional History: Prior Function Level of Independence: Independent Comments: pt driving herself to dialysis prior to admission  Functional Status:  Mobility: Bed Mobility Overal bed mobility: Needs Assistance Bed Mobility: Supine to Sit,Sit to Supine Rolling: Min assist Supine to sit: HOB elevated,Min guard Sit to supine: Min guard General bed mobility comments: Extra time and effort but pt able to perform bed mob with use of controls and rails with min guard today. Cues for hand placement on rails and to attend to R side. Transfers Overall transfer level: Needs assistance Equipment used: Rolling walker (2 wheeled) Transfers: Sit to/from Stand Sit to Stand: From elevated surface,Min assist Stand pivot transfers: +2 safety/equipment,Min assist General transfer  comment: Sit to stand 2x from EOB with minA for stability and cueing pt to push up at least with L hand on bed rather than pull up on RW. Ambulation/Gait Ambulation/Gait assistance: Min assist,Mod assist Gait Distance (Feet): 30 Feet (x2 bouts) Assistive device: Rolling walker (2 wheeled) Gait Pattern/deviations: Step-through pattern,Decreased step length - right,Decreased stride length,Wide base of support General Gait Details: Unsteady, slow gait with decreased R step length and foot clearance. Poor attention to R side as needed continual reminders to look at R hand and foot to ensure safe positioning. R hand tends to slip from RW and R foot gets lateral to RW leg during turns towards the R. Premature sitting on bed before fully turning each time despite cues, needing min-modA for safety. Gait velocity: decreased Gait velocity interpretation: <1.8 ft/sec, indicate of risk for recurrent falls  ADL: ADL Overall ADL's : Needs assistance/impaired Eating/Feeding: Moderate assistance Eating/Feeding Details (indicate cue type and reason): spilling cup tryign to pick it up Grooming: Wash/dry face,Minimal assistance,Sitting Grooming Details (indicate cue  type and reason): only washing L side of face. pt with wash cloth placed on face to see if pt would remove it due to communication deficits. pt shaking head to make it fall instead of using either arm Upper Body Bathing: Maximal assistance Lower Body Bathing: Moderate assistance Upper Body Dressing : Moderate assistance Lower Body Dressing: Moderate assistance Toilet Transfer: +2 for physical assistance,Moderate assistance,Stand-pivot Toilet Transfer Details (indicate cue type and reason): simulate EOB to chair General ADL Comments: pt motivated by [employer]. pt with x4 solitaire games loaded on ipad.  Cognition: Cognition Overall Cognitive Status: Impaired/Different from baseline Arousal/Alertness: Awake/alert Orientation Level: Oriented  X4 Attention: Sustained Sustained Attention: Impaired Sustained Attention Impairment: Verbal basic,Functional basic Memory:  (TBA) Awareness: Impaired Awareness Impairment: Emergent impairment Problem Solving: Impaired Problem Solving Impairment: Functional basic Behaviors: Impulsive Safety/Judgment: Impaired Cognition Arousal/Alertness: Awake/alert Behavior During Therapy: Impulsive Overall Cognitive Status: Impaired/Different from baseline Area of Impairment: Following commands,Safety/judgement,Awareness,Memory,Attention,Problem solving Current Attention Level: Sustained Memory: Decreased short-term memory,Decreased recall of precautions Following Commands: Follows one step commands inconsistently,Follows one step commands with increased time Safety/Judgement: Decreased awareness of safety,Decreased awareness of deficits Awareness: Emergent Problem Solving: Slow processing,Requires verbal cues,Requires tactile cues General Comments: Pt impulsive in starting transitions prior to being cued at times and especially with premature sitting. Needs repeated simple commands to direct pt to attend to R side and maintain safety.  Blood pressure (!) 160/59, pulse (!) 49, temperature 98.3 F (36.8 C), temperature source Oral, resp. rate 20, height '5\' 8"'$  (1.727 m), weight 116.8 kg, SpO2 96 %. Physical Exam Gen: no distress, normal appearing HEENT: oral mucosa pink and moist, NCAT Cardio:    Rate and Rhythm: Bradycardia present.  Neurological:     Mental Status: She is alert.     Comments: Globally aphasic--tends to make humming yes sounds to  most questions. She was able to follow simple motor commands with tactile cues but unable to perform MMT and was unable to point to items. RUE>RLE weakness.      Results for orders placed or performed during the hospital encounter of 07/11/20 (from the past 48 hour(s))  Glucose, capillary     Status: Abnormal   Collection Time: 07/16/20  8:15 PM   Result Value Ref Range   Glucose-Capillary 347 (H) 70 - 99 mg/dL    Comment: Glucose reference range applies only to samples taken after fasting for at least 8 hours.  Glucose, capillary     Status: Abnormal   Collection Time: 07/16/20 11:37 PM  Result Value Ref Range   Glucose-Capillary 236 (H) 70 - 99 mg/dL    Comment: Glucose reference range applies only to samples taken after fasting for at least 8 hours.  Glucose, capillary     Status: Abnormal   Collection Time: 07/17/20  3:54 AM  Result Value Ref Range   Glucose-Capillary 197 (H) 70 - 99 mg/dL    Comment: Glucose reference range applies only to samples taken after fasting for at least 8 hours.  Basic metabolic panel     Status: Abnormal   Collection Time: 07/17/20  4:20 AM  Result Value Ref Range   Sodium 134 (L) 135 - 145 mmol/L   Potassium 4.3 3.5 - 5.1 mmol/L   Chloride 93 (L) 98 - 111 mmol/L   CO2 24 22 - 32 mmol/L   Glucose, Bld 195 (H) 70 - 99 mg/dL    Comment: Glucose reference range applies only to samples taken after fasting for at least 8 hours.  BUN 30 (H) 8 - 23 mg/dL   Creatinine, Ser 7.96 (H) 0.44 - 1.00 mg/dL   Calcium 8.6 (L) 8.9 - 10.3 mg/dL   GFR, Estimated 5 (L) >60 mL/min    Comment: (NOTE) Calculated using the CKD-EPI Creatinine Equation (2021)    Anion gap 17 (H) 5 - 15    Comment: Performed at [facility] [address] [place], [place] [address]  CBC     Status: Abnormal   Collection Time: 07/17/20  4:20 AM  Result Value Ref Range   WBC 6.3 4.0 - 10.5 K/uL   RBC 3.47 (L) 3.87 - 5.11 MIL/uL   Hemoglobin 10.9 (L) 12.0 - 15.0 g/dL   HCT 33.3 (L) 36.0 - 46.0 %   MCV 96.0 80.0 - 100.0 fL   MCH 31.4 26.0 - 34.0 pg   MCHC 32.7 30.0 - 36.0 g/dL   RDW 15.7 (H) 11.5 - 15.5 %   Platelets 184 150 - 400 K/uL   nRBC 0.0 0.0 - 0.2 %    Comment: Performed at [facility], [place] [address] [place], [place] [address]  Glucose, capillary     Status: Abnormal   Collection Time: 07/17/20  9:16  AM  Result Value Ref Range   Glucose-Capillary 235 (H) 70 - 99 mg/dL    Comment: Glucose reference range applies only to samples taken after fasting for at least 8 hours.  Glucose, capillary     Status: Abnormal   Collection Time: 07/17/20 11:20 AM  Result Value Ref Range   Glucose-Capillary 264 (H) 70 - 99 mg/dL    Comment: Glucose reference range applies only to samples taken after fasting for at least 8 hours.  Glucose, capillary     Status: Abnormal   Collection Time: 07/17/20  4:05 PM  Result Value Ref Range   Glucose-Capillary 351 (H) 70 - 99 mg/dL    Comment: Glucose reference range applies only to samples taken after fasting for at least 8 hours.  Glucose, capillary     Status: Abnormal   Collection Time: 07/17/20  9:14 PM  Result Value Ref Range   Glucose-Capillary 344 (H) 70 - 99 mg/dL    Comment: Glucose reference range applies only to samples taken after fasting for at least 8 hours.  Basic metabolic panel     Status: Abnormal   Collection Time: 07/18/20  3:21 AM  Result Value Ref Range   Sodium 132 (L) 135 - 145 mmol/L   Potassium 4.5 3.5 - 5.1 mmol/L   Chloride 93 (L) 98 - 111 mmol/L   CO2 25 22 - 32 mmol/L   Glucose, Bld 237 (H) 70 - 99 mg/dL    Comment: Glucose reference range applies only to samples taken after fasting for at least 8 hours.   BUN 44 (H) 8 - 23 mg/dL   Creatinine, Ser 9.38 (H) 0.44 - 1.00 mg/dL   Calcium 8.7 (L) 8.9 - 10.3 mg/dL   GFR, Estimated 4 (L) >60 mL/min    Comment: (NOTE) Calculated using the CKD-EPI Creatinine Equation (2021)    Anion gap 14 5 - 15    Comment: Performed at [facility] [address] [place] [address]  CBC     Status: Abnormal   Collection Time: 07/18/20  3:21 AM  Result Value Ref Range   WBC 7.4 4.0 - 10.5 K/uL   RBC 3.50 (L) 3.87 - 5.11 MIL/uL   Hemoglobin 10.7 (L) 12.0 -  15.0 g/dL   HCT 33.8 (L) 36.0 - 46.0 %   MCV 96.6 80.0 - 100.0 fL   MCH 30.6 26.0 - 34.0 pg   MCHC 31.7 30.0 - 36.0 g/dL    RDW 15.3 11.5 - 15.5 %   Platelets 214 150 - 400 K/uL   nRBC 0.0 0.0 - 0.2 %    Comment: Performed at [facility], [place] [address], [place], [place] [address]  Glucose, capillary     Status: Abnormal   Collection Time: 07/18/20  6:08 AM  Result Value Ref Range   Glucose-Capillary 248 (H) 70 - 99 mg/dL    Comment: Glucose reference range applies only to samples taken after fasting for at least 8 hours.  Glucose, capillary     Status: Abnormal   Collection Time: 07/18/20 11:04 AM  Result Value Ref Range   Glucose-Capillary 236 (H) 70 - 99 mg/dL    Comment: Glucose reference range applies only to samples taken after fasting for at least 8 hours.  Glucose, capillary     Status: Abnormal   Collection Time: 07/18/20  3:31 PM  Result Value Ref Range   Glucose-Capillary 175 (H) 70 - 99 mg/dL    Comment: Glucose reference range applies only to samples taken after fasting for at least 8 hours.   No results found.     Medical Problem List and Plan: 1.  Ischemic left MCA stroke  -patient may shower  -ELOS/Goals: S 8-10 days 2.  Antithrombotics: -DVT/anticoagulation:  Pharmaceutical: Other (comment)--Eliquis  -antiplatelet therapy: N/A 3. Pain Management: Tylenol prn. 4. Mood: LCSW to follow for evaluation and support.   -antipsychotic agents: N/A 5. Neuropsych: This patient is capable of making decisions on her own behalf. 6. Skin/Wound Care: Routine pressure relief measures.  7. Fluids/Electrolytes/Nutrition: Strict I/O. Monitor weights daily   --Renal/CM diet with 1200 cc/FR 8. ESRD: Schedule HD at the end of the day to help with tolerance of therapy.  -- TTS during hospitalization.   --hyponatremia stable and hyperkalemia resolved (post lokelma/HD).  9. A lutter/Bradycardia: Has failed attempts at R-PPM and to follow up with primary cardiology for leadless pacemaker  --continue to monitor HR tid (Norvasc back on board) 10. HTN: Monitor BP tid --Amlodipine and Cozaar resumed  on 02/23. Systolic elevated and diastolic soft. Continue as per nephrology recommendations  --monitor for recurrent bradycardia. 11. T2DM: Hgb A1C- 8.5. Was on Lantus 80 units/hs PTA.   --Monitor BS ac/hs and titrate lantus for better control.   CBGS 175-344: increase Lantus to 16U 12. OSA: Resume CPAP--question compliance. 13. Obesity (BMI 39.15): provide dietary counseling  I have personally performed a face to face diagnostic evaluation, including, but not limited to relevant history and physical exam findings, of this patient and developed relevant assessment and plan.  Additionally, I have reviewed and concur with the physician assistant's documentation above.  Izora Ribas, MD 07/18/2020   Bary Leriche, PA-C

## 2020-07-18 NOTE — Progress Notes (Signed)
Inpatient Rehabilitation Medication Review by a Pharmacist  A complete drug regimen review was completed for this patient to identify any potential clinically significant medication issues.  Clinically significant medication issues were identified:  no  Check AMION for pharmacist assigned to patient if future medication questions/issues arise during this admission.  Pharmacist comments:   Time spent performing this drug regimen review (minutes):  5   Nicole Cella, [facility] Clinical Pharmacist Please check AMION for all [facility] phone numbers After 10:00 PM, call [facility] [phone]  07/18/2020 10:15 PM

## 2020-07-18 NOTE — Discharge Summary (Signed)
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 with a small amount of petechial blood products in the left basal ganglia d/t L M2 occlusion s/p successful thrombectomy  Active Problems:   Aflutter    Bradycardia failed R sided PPM implant   HTN   HLD   DM, uncontrolled   ESRD on HD   Morbid obesity   OSA on CAPA   Oral thrash   Medications to be continued on Rehab Allergies as of 07/18/2020   No Known Allergies     LABORATORY STUDIES CBC    Component Value Date/Time   WBC 7.4 07/18/2020 0321   RBC 3.50 (L) 07/18/2020 0321   HGB 10.7 (L) 07/18/2020 0321   HCT 33.8 (L) 07/18/2020 0321   PLT 214 07/18/2020 0321   MCV 96.6 07/18/2020 0321   MCH 30.6 07/18/2020 0321   MCHC 31.7 07/18/2020 0321   RDW 15.3 07/18/2020 0321   LYMPHSABS 1.8 07/11/2020 1350   MONOABS 1.1 (H) 07/11/2020 1350   EOSABS 0.2 07/11/2020 1350   BASOSABS 0.0 07/11/2020 1350   CMP    Component Value Date/Time   NA 132 (L) 07/18/2020 0321   K 4.5 07/18/2020 0321   CL 93 (L) 07/18/2020 0321   CO2 25 07/18/2020 0321   GLUCOSE 237 (H) 07/18/2020 0321   BUN 44 (H) 07/18/2020 0321   CREATININE 9.38 (H) 07/18/2020 0321   CALCIUM 8.7 (L) 07/18/2020 0321   PROT 6.9 07/12/2020 1141   ALBUMIN 3.6 07/12/2020 1141   AST 22 07/12/2020 1141   ALT 13 07/12/2020 1141   ALKPHOS 79 07/12/2020 1141   BILITOT 0.9 07/12/2020 1141   GFRNONAA 4 (L) 07/18/2020 0321   COAGS Lab Results  Component Value Date   INR 1.0 07/11/2020   Lipid Panel    Component Value Date/Time   CHOL  156 07/12/2020 0229   TRIG 382 (H) 07/12/2020 0229   HDL 31 (L) 07/12/2020 0229   CHOLHDL 5.0 07/12/2020 0229   VLDL 76 (H) 07/12/2020 0229   LDLCALC 49 07/12/2020 0229   HgbA1C  Lab Results  Component Value Date   HGBA1C 8.5 (H) 07/12/2020   Urinalysis No results found for: COLORURINE, APPEARANCEUR, LABSPEC, PHURINE, GLUCOSEU, HGBUR, BILIRUBINUR, KETONESUR, PROTEINUR, UROBILINOGEN, NITRITE, LEUKOCYTESUR Urine Drug Screen No results found for: LABOPIA, COCAINSCRNUR, LABBENZ, AMPHETMU, THCU, LABBARB  Alcohol Level No results found for: [facility]   SIGNIFICANT DIAGNOSTIC STUDIES CT Code Stroke CTA Head W/WO contrast  Result Date: 07/11/2020 CLINICAL DATA:  Right-sided deficits, slurred speech EXAM: CT HEAD WITHOUT CONTRAST CT ANGIOGRAPHY HEAD AND NECK TECHNIQUE: Multidetector CT imaging of the head was performed without contrast. Multidetector CT imaging of the head and neck was performed using the standard protocol during bolus administration of intravenous contrast. Multiplanar CT image reconstructions and MIPs were obtained to evaluate the vascular anatomy. Carotid stenosis measurements (when applicable) are obtained utilizing NASCET criteria, using the distal internal carotid diameter as the denominator. CONTRAST:  100 mL Omnipaque 350 COMPARISON:  None. FINDINGS: CT HEAD FINDINGS Brain:  There is no acute intracranial hemorrhage. Hypoattenuation with loss of gray-white differentiation in the parasagittal left occipital lobe. Possible loss of gray-white differentiation along the body of the left caudate. Ventricles and sulci are within normal limits in size and configuration. Vascular: No hyperdense vessel. Skull: Unremarkable. Sinuses/Orbits: Paranasal sinus mucosal thickening. No significant orbital abnormality. Other: Mastoid air cells are clear. ASPECTS ([place] Stroke Program Early CT Score) - Ganglionic level infarction (caudate, lentiform nuclei, internal capsule, insula, M1-M3 cortex): 6 -  Supraganglionic infarction (M4-M6 cortex): 3 Total score (0-10 with 10 being normal): 9 Review of the MIP images confirms the above findings CTA NECK FINDINGS Aortic arch: Mild calcified plaque along the aortic arch. Patent great vessel origins. Right carotid system: Patent. Calcified plaque at the ICA origin without measurable stenosis. Left carotid system: Patent. Calcified plaque at the ICA origin without measurable stenosis. Vertebral arteries: Patent and codominant. Skeleton: Degenerative changes of the cervical spine. Other neck: No mass or adenopathy. Upper chest: No apical lung mass. Review of the MIP images confirms the above findings CTA HEAD FINDINGS Anterior circulation: Intracranial internal carotid arteries are patent with calcified plaque causing mild stenosis. Proximal left M1 MCA is patent. There is nonocclusive thrombus at the bifurcation. There is occlusion of a proximal M3 branch. Right middle and both anterior cerebral arteries are patent. Posterior circulation: Intracranial vertebral arteries, basilar artery, and posterior cerebral arteries are patent. A left posterior communicating artery is present. Venous sinuses: As permitted by contrast timing, patent. Review of the MIP images confirms the above findings IMPRESSION: Acute left occipital lobe infarction. Suspected acute infarction of the body of the left caudate. No large vessel occlusion or hemodynamically significant stenosis in the neck. Nonocclusive thrombus at the left MCA bifurcation. Occlusion of a proximal left M3 MCA branch. Patent proximal left PCA. These results were communicated to Dr. Charlsie Merles at 2:25 pm on 07/11/2020 by text page via the [facility] messaging system. Electronically Signed   By: Macy Mis M.D.   On: 07/11/2020 14:51   CT Code Stroke CTA Neck W/WO contrast  Result Date: 07/11/2020 CLINICAL DATA:  Right-sided deficits, slurred speech EXAM: CT HEAD WITHOUT CONTRAST CT ANGIOGRAPHY HEAD AND NECK TECHNIQUE:  Multidetector CT imaging of the head was performed without contrast. Multidetector CT imaging of the head and neck was performed using the standard protocol during bolus administration of intravenous contrast. Multiplanar CT image reconstructions and MIPs were obtained to evaluate the vascular anatomy. Carotid stenosis measurements (when applicable) are obtained utilizing NASCET criteria, using the distal internal carotid diameter as the denominator. CONTRAST:  100 mL Omnipaque 350 COMPARISON:  None. FINDINGS: CT HEAD FINDINGS Brain: There is no acute intracranial hemorrhage. Hypoattenuation with loss of gray-white differentiation in the parasagittal left occipital lobe. Possible loss of gray-white differentiation along the body of the left caudate. Ventricles and sulci are within normal limits in size and configuration. Vascular: No hyperdense vessel. Skull: Unremarkable. Sinuses/Orbits: Paranasal sinus mucosal thickening. No significant orbital abnormality. Other: Mastoid air cells are clear. ASPECTS ([place] Stroke Program Early CT Score) - Ganglionic level infarction (caudate, lentiform nuclei, internal capsule, insula, M1-M3 cortex): 6 - Supraganglionic infarction (M4-M6 cortex): 3 Total score (0-10 with 10 being normal): 9 Review of the MIP images confirms the above findings CTA NECK FINDINGS Aortic arch: Mild calcified plaque along the aortic arch. Patent great vessel origins. Right carotid system: Patent. Calcified plaque at the ICA origin without measurable stenosis. Left carotid system: Patent. Calcified plaque at the ICA origin without measurable stenosis.  Vertebral arteries: Patent and codominant. Skeleton: Degenerative changes of the cervical spine. Other neck: No mass or adenopathy. Upper chest: No apical lung mass. Review of the MIP images confirms the above findings CTA HEAD FINDINGS Anterior circulation: Intracranial internal carotid arteries are patent with calcified plaque causing mild stenosis.  Proximal left M1 MCA is patent. There is nonocclusive thrombus at the bifurcation. There is occlusion of a proximal M3 branch. Right middle and both anterior cerebral arteries are patent. Posterior circulation: Intracranial vertebral arteries, basilar artery, and posterior cerebral arteries are patent. A left posterior communicating artery is present. Venous sinuses: As permitted by contrast timing, patent. Review of the MIP images confirms the above findings IMPRESSION: Acute left occipital lobe infarction. Suspected acute infarction of the body of the left caudate. No large vessel occlusion or hemodynamically significant stenosis in the neck. Nonocclusive thrombus at the left MCA bifurcation. Occlusion of a proximal left M3 MCA branch. Patent proximal left PCA. These results were communicated to Dr. Charlsie Merles at 2:25 pm on 07/11/2020 by text page via the [facility] messaging system. Electronically Signed   By: Macy Mis M.D.   On: 07/11/2020 14:51   MR BRAIN WO CONTRAST  Result Date: 07/12/2020 CLINICAL DATA:  Acute infarct EXAM: MRI HEAD WITHOUT CONTRAST TECHNIQUE: Multiplanar, multiecho pulse sequences of the brain and surrounding structures were obtained without intravenous contrast. COMPARISON:  Head CT 07/11/2020 FINDINGS: Brain: There is multifocal acute ischemia the left MCA and PCA territories, including a large MCA territory infarct that involves the corpus striatum, insula anterior temporal cortex and left frontal operculum. Petechial blood products at the left basal ganglia. There is moderate cytotoxic edema. The midline structures are normal. Vascular: Major flow voids are preserved. Skull and upper cervical spine: Normal calvarium and skull base. Visualized upper cervical spine and soft tissues are normal. Sinuses/Orbits:No paranasal sinus fluid levels or advanced mucosal thickening. No mastoid or middle ear effusion. Normal orbits. IMPRESSION: Acute ischemic infarcts within the left MCA and PCA  territories with a small amount of petechial blood products in the left basal ganglia. No midline shift or other mass effect. Electronically Signed   By: Ulyses Jarred M.D.   On: 07/12/2020 02:11   DG Pelvis Portable  Result Date: 07/12/2020 CLINICAL DATA:  MRI clearance EXAM: PORTABLE PELVIS 1-2 VIEWS COMPARISON:  None. FINDINGS: There is no evidence of pelvic fracture or diastasis. No pelvic bone lesions are seen. IMPRESSION: Negative.  No metallic foreign body. Electronically Signed   By: Ulyses Jarred M.D.   On: 07/12/2020 01:33   IR CT Head Ltd  Result Date: 07/17/2020 Narrative & Impression INDICATION: 60 year old female with past medical history significant for Type 2 diabetes mellitus, chronic kidney disease stage 4, hypertension, obesity and OSA. Presenting with right-sided weakness and aphasia. Last known well at 8 a.m. on 07/11/2020. NIHSS 24; modified Rankin scale 1. Head CT showed acute/subacute infarct in the left occipital lobe, nonocclusive distal left M1 thrombosis and occlusion of a distal left M2/MCA posterior division branch. She was taken to our service for a diagnostic cerebral angiogram and mechanical thrombectomy.  EXAM: Ultrasound-guided vascular access  Diagnostic cerebral angiogram  Mechanical thrombectomy  Flat panel head CT  COMPARISON:  CT/CT angiogram of the head and neck July 11, 2020.  MEDICATIONS: Refer to anesthesia documentation.  ANESTHESIA/SEDATION: The procedure was performed in the general anesthesia.  CONTRAST:  45 mL of Omnipaque 240 milligram/mL  FLUOROSCOPY TIME:  Fluoroscopy Time: 9 minutes 30 seconds (678 mGy).  COMPLICATIONS: None  immediate.  TECHNIQUE: Informed written consent was obtained from the patient's daughter after a thorough discussion of the procedural risks, benefits and alternatives. All questions were addressed. Maximal Sterile Barrier Technique was utilized including caps, mask, sterile gowns, sterile gloves, sterile drape, hand  hygiene and skin antiseptic. A timeout was performed prior to the initiation of the procedure.  The right groin was prepped and draped in the usual sterile fashion. Using a micropuncture kit and the modified Seldinger technique, access was gained to the left common femoral artery and an 8 French sheath was placed. Real-time ultrasound guidance was utilized for vascular access including the acquisition of a permanent ultrasound image documenting patency of the accessed vessel.  Under fluoroscopy, a Zoom 88 guide catheter was navigated over a 6 [place] Berenstein 2 catheter and a 0.035" Terumo Glidewire into the aortic arch. The catheter was placed into the left common carotid artery and then advanced into the left internal carotid artery. The inner catheter was removed. Frontal and lateral angiograms of the head were obtained.  FINDINGS: 1. Heavily calcified plaques in the right common femoral artery with high-grade stenosis. 2. Heavily calcified plaques in the left common femoral artery without significant stenosis. 3. Occlusion of a distal left M2/MCA posterior division branch.  PROCEDURE: Under biplane roadmap, a zoom 71 aspiration catheter was navigated over a phenom 21 microcatheter and a synchro support microguidewire into the cavernous segment of the left ICA. The microcatheter was then navigated over the wire into the left M2/MCA posterior division branch. Then, a 4 x 40 mm solitaire stent retriever was deployed spanning the M2 segment. The device was allowed to intercalated with the clot for 4 minutes. The microcatheter was removed. The aspiration catheter was advanced to the level of occlusion and connected to a penumbra asp a iration pump. The guiding catheter was advanced to the level of the supraclinoid left ICA. The thrombectomy device and aspiration catheter were removed under constant aspiration.  Follow-up left ICA angiograms with frontal lateral views of the head showed complete recanalization  of the left MCA territory (TICI3).  Flat panel CT of the head was obtained and post processed in a separate workstation with concurrent attending physician supervision. Selected images were sent to PACS. Minimal left to in hyperdensity may represent minimal contrast staining.  The guide catheter was withdrawn.  A 6 [place] Berenstein 2 catheter was navigated over a 0.035 inch Terumo Glidewire into the aortic arch. The catheter was then placed in the left subclavian artery and subsequently in the left vertebral artery. Townes and lateral angiograms of the head were obtained. The left vertebral artery, basilar and bilateral posterior cerebral arteries are patent. The intracranial right vertebral artery was seen by contrast reflux in appear normal.  The catheter was subsequently withdrawn.  Left common femoral artery angiograms were obtained and lateral left anterior oblique view. The puncture is at the level of the left femoral artery. The femoral sheath was exchanged for an 8 [place] Angio-Seal which was utilized for access closure. Immediate hemostasis was achieved  IMPRESSION: Successful and uncomplicated mechanical thrombectomy for treatment of a left M2/MCA occlusion. One pass performed with complete recanalization (TICI 3)  PLAN: Patient extubated and transferred to ICU for continued care.   Electronically Signed   By: Pedro Earls M.D.   On: 07/12/2020 12:15    IR US Guide Vasc Access Left  Result Date: 07/12/2020 INDICATION: 60 year old female with past medical history significant for Type 2 diabetes mellitus, chronic kidney  disease stage 4, hypertension, obesity and OSA. Presenting with right-sided weakness and aphasia. Last known well at 8 a.m. on 07/11/2020. NIHSS 24; modified Rankin scale 1. Head CT showed acute/subacute infarct in the left occipital lobe, nonocclusive distal left M1 thrombosis and occlusion of a distal left M2/MCA posterior division branch. She was taken to  our service for a diagnostic cerebral angiogram and mechanical thrombectomy. EXAM: Ultrasound-guided vascular access Diagnostic cerebral angiogram Mechanical thrombectomy Flat panel head CT COMPARISON:  CT/CT angiogram of the head and neck July 11, 2020. MEDICATIONS: Refer to anesthesia documentation. ANESTHESIA/SEDATION: The procedure was performed in the general anesthesia. CONTRAST:  45 mL of Omnipaque 240 milligram/mL FLUOROSCOPY TIME:  Fluoroscopy Time: 9 minutes 30 seconds (678 mGy). COMPLICATIONS: None immediate. TECHNIQUE: Informed written consent was obtained from the patient's daughter after a thorough discussion of the procedural risks, benefits and alternatives. All questions were addressed. Maximal Sterile Barrier Technique was utilized including caps, mask, sterile gowns, sterile gloves, sterile drape, hand hygiene and skin antiseptic. A timeout was performed prior to the initiation of the procedure. The right groin was prepped and draped in the usual sterile fashion. Using a micropuncture kit and the modified Seldinger technique, access was gained to the left common femoral artery and an 8 French sheath was placed. Real-time ultrasound guidance was utilized for vascular access including the acquisition of a permanent ultrasound image documenting patency of the accessed vessel. Under fluoroscopy, a Zoom 88 guide catheter was navigated over a 6 [place] Berenstein 2 catheter and a 0.035" Terumo Glidewire into the aortic arch. The catheter was placed into the left common carotid artery and then advanced into the left internal carotid artery. The inner catheter was removed. Frontal and lateral angiograms of the head were obtained. FINDINGS: 1. Heavily calcified plaques in the right common femoral artery with high-grade stenosis. 2. Heavily calcified plaques in the left common femoral artery without significant stenosis. 3. Occlusion of a distal left M2/MCA posterior division branch. PROCEDURE: Under  biplane roadmap, a zoom 71 aspiration catheter was navigated over a phenom 21 microcatheter and a synchro support microguidewire into the cavernous segment of the left ICA. The microcatheter was then navigated over the wire into the left M2/MCA posterior division branch. Then, a 4 x 40 mm solitaire stent retriever was deployed spanning the M2 segment. The device was allowed to intercalated with the clot for 4 minutes. The microcatheter was removed. The aspiration catheter was advanced to the level of occlusion and connected to a penumbra asp a iration pump. The guiding catheter was advanced to the level of the supraclinoid left ICA. The thrombectomy device and aspiration catheter were removed under constant aspiration. Follow-up left ICA angiograms with frontal lateral views of the head showed complete recanalization of the left MCA territory (TICI3). Flat panel CT of the head was obtained and post processed in a separate workstation with concurrent attending physician supervision. Selected images were sent to PACS. Minimal left to in hyperdensity may represent minimal contrast staining. The guide catheter was withdrawn. A 6 [place] Berenstein 2 catheter was navigated over a 0.035 inch Terumo Glidewire into the aortic arch. The catheter was then placed in the left subclavian artery and subsequently in the left vertebral artery. Townes and lateral angiograms of the head were obtained. The left vertebral artery, basilar and bilateral posterior cerebral arteries are patent. The intracranial right vertebral artery was seen by contrast reflux in appear normal. The catheter was subsequently withdrawn. Left common femoral artery angiograms were obtained and  lateral left anterior oblique view. The puncture is at the level of the left femoral artery. The femoral sheath was exchanged for an 8 [place] Angio-Seal which was utilized for access closure. Immediate hemostasis was achieved IMPRESSION: Successful and uncomplicated  mechanical thrombectomy for treatment of a left M2/MCA occlusion. One pass performed with complete recanalization (TICI 3) PLAN: Patient extubated and transferred to ICU for continued care. Electronically Signed   By: Pedro Earls M.D.   On: 07/12/2020 12:15   DG CHEST PORT 1 VIEW  Result Date: 07/12/2020 CLINICAL DATA:  MRI screening EXAM: PORTABLE CHEST 1 VIEW COMPARISON:  None. FINDINGS: The right side of the image is labeled incorrectly. There is mild cardiomegaly with left basilar mild opacity. No metallic foreign body. IMPRESSION: 1. No metallic foreign body. Electronically Signed   By: Ulyses Jarred M.D.   On: 07/12/2020 01:35   DG Abd Portable 1V  Result Date: 07/12/2020 CLINICAL DATA:  MRI clearance EXAM: PORTABLE ABDOMEN - 1 VIEW COMPARISON:  None. FINDINGS: The bowel gas pattern is normal. No radio-opaque calculi or other significant radiographic abnormality are seen. IMPRESSION: No metallic foreign body. Electronically Signed   By: Ulyses Jarred M.D.   On: 07/12/2020 01:35   ECHOCARDIOGRAM COMPLETE  Result Date: 07/12/2020    ECHOCARDIOGRAM REPORT   Patient Name:   Rachael Herrera Date of Exam: 07/12/2020 Medical Rec #:  [id number]     Height: Accession #:    [id number]    Weight:       255.1 lb Date of Birth:  06-Jan-1961     BSA:          2.375 m Patient Age:    89 years      BP:           131/73 mmHg Patient Gender: F             HR:           71 bpm. Exam Location:  Inpatient Procedure: 2D Echo, Cardiac Doppler, Color Doppler and Intracardiac            Opacification Agent Indications:    Stroke  History:        Patient has no prior history of Echocardiogram examinations.                 Stroke; Risk Factors:Hypertension, Dyslipidemia and Sleep Apnea.  Sonographer:    Roseanna Rainbow RDCS Referring Phys: [id number] Minette Brine [facility]  Sonographer Comments: Technically difficult study due to poor echo windows and patient is morbidly obese. Image acquisition challenging due to patient body  habitus. IMPRESSIONS  1. Left ventricular ejection fraction, by estimation, is 55 to 60%. The left ventricle has normal function. The left ventricle has no regional wall motion abnormalities. There is moderate left ventricular hypertrophy. Left ventricular diastolic parameters are indeterminate.  2. Right ventricular systolic function is normal. The right ventricular size is normal.  3. The mitral valve is normal in structure. No evidence of mitral valve regurgitation. No evidence of mitral stenosis.  4. The aortic valve is tricuspid. Aortic valve regurgitation is trivial. Mild aortic valve sclerosis is present, with no evidence of aortic valve stenosis.  5. The inferior vena cava is dilated in size with >50% respiratory variability, suggesting right atrial pressure of 8 mmHg. FINDINGS  Left Ventricle: Left ventricular ejection fraction, by estimation, is 55 to 60%. The left ventricle has normal function. The left ventricle has no regional wall motion abnormalities. Definity contrast  agent was given IV to delineate the left ventricular  endocardial borders. The left ventricular internal cavity size was normal in size. There is moderate left ventricular hypertrophy. Left ventricular diastolic parameters are indeterminate. Right Ventricle: The right ventricular size is normal. No increase in right ventricular wall thickness. Right ventricular systolic function is normal. Left Atrium: Left atrial size was normal in size. Right Atrium: Right atrial size was normal in size. Pericardium: There is no evidence of pericardial effusion. Mitral Valve: The mitral valve is normal in structure. No evidence of mitral valve regurgitation. No evidence of mitral valve stenosis. Tricuspid Valve: The tricuspid valve is normal in structure. Tricuspid valve regurgitation is not demonstrated. No evidence of tricuspid stenosis. Aortic Valve: The aortic valve is tricuspid. Aortic valve regurgitation is trivial. Mild aortic valve sclerosis  is present, with no evidence of aortic valve stenosis. Pulmonic Valve: The pulmonic valve was normal in structure. Pulmonic valve regurgitation is trivial. No evidence of pulmonic stenosis. Aorta: The aortic root is normal in size and structure. Venous: The inferior vena cava is dilated in size with greater than 50% respiratory variability, suggesting right atrial pressure of 8 mmHg. IAS/Shunts: No atrial level shunt detected by color flow Doppler.  LEFT VENTRICLE PLAX 2D LVIDd:         4.40 cm LVIDs:         3.30 cm LV PW:         1.40 cm LV IVS:        1.40 cm LVOT diam:     2.30 cm LV SV:         83 LV SV Index:   35 LVOT Area:     4.15 cm  LV Volumes (MOD) LV vol d, MOD A2C: 57.1 ml LV vol d, MOD A4C: 84.3 ml LV vol s, MOD A2C: 20.0 ml LV vol s, MOD A4C: 39.0 ml LV SV MOD A2C:     37.1 ml LV SV MOD A4C:     84.3 ml LV SV MOD BP:      41.7 ml RIGHT VENTRICLE             IVC RV S prime:     15.20 cm/s  IVC diam: 2.10 cm TAPSE (M-mode): 2.6 cm LEFT ATRIUM             Index       RIGHT ATRIUM           Index LA diam:        4.00 cm 1.68 cm/m  RA Area:     14.50 cm LA Vol (A2C):   32.1 ml 13.52 ml/m RA Volume:   34.00 ml  14.32 ml/m LA Vol (A4C):   44.1 ml 18.57 ml/m LA Biplane Vol: 40.5 ml 17.05 ml/m  AORTIC VALVE LVOT Vmax:   104.00 cm/s LVOT Vmean:  79.200 cm/s LVOT VTI:    0.200 m  AORTA Ao Root diam: 3.30 cm Ao Asc diam:  3.60 cm MITRAL VALVE MV Area (PHT): 4.34 cm     SHUNTS MV Decel Time: 175 msec     Systemic VTI:  0.20 m MV E velocity: 121.00 cm/s  Systemic Diam: 2.30 cm Candee Furbish MD Electronically signed by Candee Furbish MD Signature Date/Time: 07/12/2020/11:51:00 AM    Final    IR PERCUTANEOUS ART THROMBECTOMY/INFUSION INTRACRANIAL INC DIAG ANGIO  Result Date: 07/12/2020 INDICATION: 60 year old female with past medical history significant for Type 2 diabetes mellitus, chronic kidney disease stage 4, hypertension, obesity  and OSA. Presenting with right-sided weakness and aphasia. Last known well  at 8 a.m. on 07/11/2020. NIHSS 24; modified Rankin scale 1. Head CT showed acute/subacute infarct in the left occipital lobe, nonocclusive distal left M1 thrombosis and occlusion of a distal left M2/MCA posterior division branch. She was taken to our service for a diagnostic cerebral angiogram and mechanical thrombectomy. EXAM: Ultrasound-guided vascular access Diagnostic cerebral angiogram Mechanical thrombectomy Flat panel head CT COMPARISON:  CT/CT angiogram of the head and neck July 11, 2020. MEDICATIONS: Refer to anesthesia documentation. ANESTHESIA/SEDATION: The procedure was performed in the general anesthesia. CONTRAST:  45 mL of Omnipaque 240 milligram/mL FLUOROSCOPY TIME:  Fluoroscopy Time: 9 minutes 30 seconds (678 mGy). COMPLICATIONS: None immediate. TECHNIQUE: Informed written consent was obtained from the patient's daughter after a thorough discussion of the procedural risks, benefits and alternatives. All questions were addressed. Maximal Sterile Barrier Technique was utilized including caps, mask, sterile gowns, sterile gloves, sterile drape, hand hygiene and skin antiseptic. A timeout was performed prior to the initiation of the procedure. The right groin was prepped and draped in the usual sterile fashion. Using a micropuncture kit and the modified Seldinger technique, access was gained to the left common femoral artery and an 8 French sheath was placed. Real-time ultrasound guidance was utilized for vascular access including the acquisition of a permanent ultrasound image documenting patency of the accessed vessel. Under fluoroscopy, a Zoom 88 guide catheter was navigated over a 6 [place] Berenstein 2 catheter and a 0.035" Terumo Glidewire into the aortic arch. The catheter was placed into the left common carotid artery and then advanced into the left internal carotid artery. The inner catheter was removed. Frontal and lateral angiograms of the head were obtained. FINDINGS: 1. Heavily calcified  plaques in the right common femoral artery with high-grade stenosis. 2. Heavily calcified plaques in the left common femoral artery without significant stenosis. 3. Occlusion of a distal left M2/MCA posterior division branch. PROCEDURE: Under biplane roadmap, a zoom 71 aspiration catheter was navigated over a phenom 21 microcatheter and a synchro support microguidewire into the cavernous segment of the left ICA. The microcatheter was then navigated over the wire into the left M2/MCA posterior division branch. Then, a 4 x 40 mm solitaire stent retriever was deployed spanning the M2 segment. The device was allowed to intercalated with the clot for 4 minutes. The microcatheter was removed. The aspiration catheter was advanced to the level of occlusion and connected to a penumbra asp a iration pump. The guiding catheter was advanced to the level of the supraclinoid left ICA. The thrombectomy device and aspiration catheter were removed under constant aspiration. Follow-up left ICA angiograms with frontal lateral views of the head showed complete recanalization of the left MCA territory (TICI3). Flat panel CT of the head was obtained and post processed in a separate workstation with concurrent attending physician supervision. Selected images were sent to PACS. Minimal left to in hyperdensity may represent minimal contrast staining. The guide catheter was withdrawn. A 6 [place] Berenstein 2 catheter was navigated over a 0.035 inch Terumo Glidewire into the aortic arch. The catheter was then placed in the left subclavian artery and subsequently in the left vertebral artery. Townes and lateral angiograms of the head were obtained. The left vertebral artery, basilar and bilateral posterior cerebral arteries are patent. The intracranial right vertebral artery was seen by contrast reflux in appear normal. The catheter was subsequently withdrawn. Left common femoral artery angiograms were obtained and lateral left anterior  oblique  view. The puncture is at the level of the left femoral artery. The femoral sheath was exchanged for an 8 [place] Angio-Seal which was utilized for access closure. Immediate hemostasis was achieved IMPRESSION: Successful and uncomplicated mechanical thrombectomy for treatment of a left M2/MCA occlusion. One pass performed with complete recanalization (TICI 3) PLAN: Patient extubated and transferred to ICU for continued care. Electronically Signed   By: Pedro Earls M.D.   On: 07/12/2020 12:15   CT HEAD CODE STROKE WO CONTRAST  Result Date: 07/11/2020 CLINICAL DATA:  Right-sided deficits, slurred speech EXAM: CT HEAD WITHOUT CONTRAST CT ANGIOGRAPHY HEAD AND NECK TECHNIQUE: Multidetector CT imaging of the head was performed without contrast. Multidetector CT imaging of the head and neck was performed using the standard protocol during bolus administration of intravenous contrast. Multiplanar CT image reconstructions and MIPs were obtained to evaluate the vascular anatomy. Carotid stenosis measurements (when applicable) are obtained utilizing NASCET criteria, using the distal internal carotid diameter as the denominator. CONTRAST:  100 mL Omnipaque 350 COMPARISON:  None. FINDINGS: CT HEAD FINDINGS Brain: There is no acute intracranial hemorrhage. Hypoattenuation with loss of gray-white differentiation in the parasagittal left occipital lobe. Possible loss of gray-white differentiation along the body of the left caudate. Ventricles and sulci are within normal limits in size and configuration. Vascular: No hyperdense vessel. Skull: Unremarkable. Sinuses/Orbits: Paranasal sinus mucosal thickening. No significant orbital abnormality. Other: Mastoid air cells are clear. ASPECTS ([place] Stroke Program Early CT Score) - Ganglionic level infarction (caudate, lentiform nuclei, internal capsule, insula, M1-M3 cortex): 6 - Supraganglionic infarction (M4-M6 cortex): 3 Total score (0-10 with 10 being  normal): 9 Review of the MIP images confirms the above findings CTA NECK FINDINGS Aortic arch: Mild calcified plaque along the aortic arch. Patent great vessel origins. Right carotid system: Patent. Calcified plaque at the ICA origin without measurable stenosis. Left carotid system: Patent. Calcified plaque at the ICA origin without measurable stenosis. Vertebral arteries: Patent and codominant. Skeleton: Degenerative changes of the cervical spine. Other neck: No mass or adenopathy. Upper chest: No apical lung mass. Review of the MIP images confirms the above findings CTA HEAD FINDINGS Anterior circulation: Intracranial internal carotid arteries are patent with calcified plaque causing mild stenosis. Proximal left M1 MCA is patent. There is nonocclusive thrombus at the bifurcation. There is occlusion of a proximal M3 branch. Right middle and both anterior cerebral arteries are patent. Posterior circulation: Intracranial vertebral arteries, basilar artery, and posterior cerebral arteries are patent. A left posterior communicating artery is present. Venous sinuses: As permitted by contrast timing, patent. Review of the MIP images confirms the above findings IMPRESSION: Acute left occipital lobe infarction. Suspected acute infarction of the body of the left caudate. No large vessel occlusion or hemodynamically significant stenosis in the neck. Nonocclusive thrombus at the left MCA bifurcation. Occlusion of a proximal left M3 MCA branch. Patent proximal left PCA. These results were communicated to Dr. Charlsie Merles at 2:25 pm on 07/11/2020 by text page via the [facility] messaging system. Electronically Signed   By: Macy Mis M.D.   On: 07/11/2020 14:51   IR ANGIO VERTEBRAL SEL VERTEBRAL UNI L MOD SED  Result Date: 07/17/2020 Narrative & Impression INDICATION: 60 year old female with past medical history significant for Type 2 diabetes mellitus, chronic kidney disease stage 4, hypertension, obesity and OSA. Presenting with  right-sided weakness and aphasia. Last known well at 8 a.m. on 07/11/2020. NIHSS 24; modified Rankin scale 1. Head CT showed acute/subacute infarct in the left occipital  lobe, nonocclusive distal left M1 thrombosis and occlusion of a distal left M2/MCA posterior division branch. She was taken to our service for a diagnostic cerebral angiogram and mechanical thrombectomy.  EXAM: Ultrasound-guided vascular access  Diagnostic cerebral angiogram  Mechanical thrombectomy  Flat panel head CT  COMPARISON:  CT/CT angiogram of the head and neck July 11, 2020.  MEDICATIONS: Refer to anesthesia documentation.  ANESTHESIA/SEDATION: The procedure was performed in the general anesthesia.  CONTRAST:  45 mL of Omnipaque 240 milligram/mL  FLUOROSCOPY TIME:  Fluoroscopy Time: 9 minutes 30 seconds (678 mGy).  COMPLICATIONS: None immediate.  TECHNIQUE: Informed written consent was obtained from the patient's daughter after a thorough discussion of the procedural risks, benefits and alternatives. All questions were addressed. Maximal Sterile Barrier Technique was utilized including caps, mask, sterile gowns, sterile gloves, sterile drape, hand hygiene and skin antiseptic. A timeout was performed prior to the initiation of the procedure.  The right groin was prepped and draped in the usual sterile fashion. Using a micropuncture kit and the modified Seldinger technique, access was gained to the left common femoral artery and an 8 French sheath was placed. Real-time ultrasound guidance was utilized for vascular access including the acquisition of a permanent ultrasound image documenting patency of the accessed vessel.  Under fluoroscopy, a Zoom 88 guide catheter was navigated over a 6 [place] Berenstein 2 catheter and a 0.035" Terumo Glidewire into the aortic arch. The catheter was placed into the left common carotid artery and then advanced into the left internal carotid artery. The inner catheter was removed. Frontal and  lateral angiograms of the head were obtained.  FINDINGS: 1. Heavily calcified plaques in the right common femoral artery with high-grade stenosis. 2. Heavily calcified plaques in the left common femoral artery without significant stenosis. 3. Occlusion of a distal left M2/MCA posterior division branch.  PROCEDURE: Under biplane roadmap, a zoom 71 aspiration catheter was navigated over a phenom 21 microcatheter and a synchro support microguidewire into the cavernous segment of the left ICA. The microcatheter was then navigated over the wire into the left M2/MCA posterior division branch. Then, a 4 x 40 mm solitaire stent retriever was deployed spanning the M2 segment. The device was allowed to intercalated with the clot for 4 minutes. The microcatheter was removed. The aspiration catheter was advanced to the level of occlusion and connected to a penumbra asp a iration pump. The guiding catheter was advanced to the level of the supraclinoid left ICA. The thrombectomy device and aspiration catheter were removed under constant aspiration.  Follow-up left ICA angiograms with frontal lateral views of the head showed complete recanalization of the left MCA territory (TICI3).  Flat panel CT of the head was obtained and post processed in a separate workstation with concurrent attending physician supervision. Selected images were sent to PACS. Minimal left to in hyperdensity may represent minimal contrast staining.  The guide catheter was withdrawn.  A 6 [place] Berenstein 2 catheter was navigated over a 0.035 inch Terumo Glidewire into the aortic arch. The catheter was then placed in the left subclavian artery and subsequently in the left vertebral artery. Townes and lateral angiograms of the head were obtained. The left vertebral artery, basilar and bilateral posterior cerebral arteries are patent. The intracranial right vertebral artery was seen by contrast reflux in appear normal.  The catheter was subsequently  withdrawn.  Left common femoral artery angiograms were obtained and lateral left anterior oblique view. The puncture is at the level of the left femoral  artery. The femoral sheath was exchanged for an 8 [place] Angio-Seal which was utilized for access closure. Immediate hemostasis was achieved  IMPRESSION: Successful and uncomplicated mechanical thrombectomy for treatment of a left M2/MCA occlusion. One pass performed with complete recanalization (TICI 3)  PLAN: Patient extubated and transferred to ICU for continued care.   Electronically Signed   By: Pedro Earls M.D.   On: 07/12/2020 12:15        HISTORY OF PRESENT ILLNESS Rachael Herrera is a 60 y.o. female who was LKW at dialysis today. She drove herself to dialysis and then back home again. Soon thereafter, she was noted to have right sided weakness with gaze preference and inability to communicate. EMS was summoned and she was brought in under stroke alert. BP in the 170s, glucose OK. Not moving the right side. Of note, she was seen on 07/09/20 in the ED at [facility] for another episode that occurred during dialysis. She became bradycardic and went "blind in the left eye." A CT head was suggested but she left without this test. She had bradycardia and dizziness at the time, perhaps precipitating the spell. Plans were initiated to have her pacemaker replaced. The note from that visit states, "Dr. Myles Rosenthal was consulted and stated that patient was very symptomatic when she got bradycardic this morning a.m. wanted [place] to make sure to possibly get a CT of her head and admit her to the hospital and let Dr. Minna Merritts See her and decide if he wants to put in a pacemaker now. Dr. Minna Merritts agreed to this plan but the patient stated that she is not going to stay in the hospital, and she will call Dr. Talbot Grumbling office and make arrangements for him to redo her pacemaker. Patient states that she is tired, her symptoms have been completely back  to normal, she did not make arrangements to stay here today, and she will just follow-up as an outpatient. She also refused to wait to do a CT of the head."  LKW: 8 AM tpa given?: no, out of time window (6 hours, possibly 2 days) Premorbid modified rankin scale: 1 ICH Score: n/a mRS baseline = 1  HOSPITAL COURSE Rachael V Ellisonis a 60 y.o.female with PMH significant for ESRD, HTN, OSA on CPAP, HLD, DM2 with peripheral neuropathy, remote seizure d/t hypoglycemia, morbid obesity, BLE claudication, tachybrady syndrome with pacemaker, NSVT, anemia, Vitamin B12 deficiency, MRSA PNA, chronic bronchitis, hypothyroidism, and  bilat ulnar neuropathy who was LKW at dialysis 2/17. She drove herself to dialysis and then back home again. Soon thereafter, she was noted to have right sided weakness with gaze preference and inability to communicate. EMS was called and she was brought in as a code stroke. BP in the 170s, glucose wnl.   Stroke - Acute ischemic infarcts within the left MCA and PCA territories with a small amount of petechial blood products in the left basal ganglia d/t L MCA M2 occlusion s/p successful thrombectomy etiology likely cardioembolic from atrial flutter/fibrillation  CT showed left occipital and left caudate infarct.   CTA head and neck left M1/M2 thrombus, left M3 occlusion.   IR performed with left M2 TICI3reperfusion.   MRI showed left MCA and PCA infarct with BG petechial hemorrhage.   2D Echo EF 55-60%, No shunt   LDL 49  HgbA1c 8.5  No antithrombotics PTA, now on eliquis. Continue on discharge  Therapy recommendations:  CIR  Disposition:  Pending    Atrial flutter, new  diagnosis  Known hx of some degree of PVCs, NSVT, and bradycardia with pacemaker and recent history of aborted replacement attempt.Also declined admission and MICRA implant  2/15 at [facility] [place].  EP consult called for possible aflutter. Appreciate recommendations.   Now on Eliquis  No  bradycardia episodes seen  Continue cardiac monitoring   Bradycardia   HR high 40s  asymptomatic  failed R sided PPM implant (L sided AVF).   She is an established patient of [facility] and has an upcoming appointment for [facility]   Hypertension  Home meds:  Norvasc $Remove'10mg'MegmizJ$  daily, losartan $RemoveBeforeD'50mg'LmdrtYSkanjiYR$  daily torsemide $RemoveBeforeD'20mg'zCJWNsDCmaEHwP$  daily.    BP stable  Resume norvasc 10 and losartan 50  Long-term BP goal normotensive   Hyperlipidemia  Home meds:  80 mg Lipitor daily   LDL 49 at goal < 70  On lipitor 80 now  Continue statin at discharge  Diabetes type II Uncontrolled  Home meds:  Lantus 80 units at bedtime   HgbA1c 8.5, goal < 7.0  CBGs with labile blood glucose with hypoglycemic episodes x 2 treated with D50 on 2/18 now improved -> hyperglycemia  DM coordinator on board - on lantus 20 daily -> 15 bid   SSI   ESRD on HD  Nephrology consulted and following. Appreciate assistance.  Hyperkalemia: HD today  Daily renal panel   Daily weights, Daily Renal Panel, Strict I/Os, Avoid nephrotoxins (NSAIDs, judicious IV Contrast)  Other Stroke Risk Factors  Former Cigarette smoker x 27 years, quit 2019  Obesity, Body mass index is 40.36 kg/m., BMI >/= 30 associated with increased stroke risk, recommend weight loss, diet and exercise as appropriate   Family hx stroke (Sister)  Obstructive sleep apnea, on CPAP at home  Other Active Problems:  Oral thrash - on nystatin oral suspension   DISCHARGE EXAM Blood pressure (!) 152/68, pulse (!) 46, temperature 98 F (36.7 C), temperature source Oral, resp. rate 15, height $RemoveBe'5\' 7"'ialKQLWZf$  (1.702 m), weight 117.7 kg, SpO2 98 %.  Constitutional: Obese, eyes open, unable to vocalize  Psych: Affect appropriate to situation Eyes: No scleral injection HENT: No OP obstrucion MSK: no joint deformities.  Cardiovascular: Normal rate and regular rhythm.  Respiratory: Effort normal, non-labored breathing, apneic at times. GI: Soft.  No distension.  There is no tenderness.  Skin: WDI  Neuro: Mental Status: Patient is awake, alert, mute, not following verbal commands, but will at times follow gestures Cranial Nerves: II: Visual Fields notable for no response to visual threat from the right. Pupils are equal, round, and reactive to light. No RAPD, no Horner's. III,IV, VI: Left gaze preference V: unable to test VII: Facial movement is asymmetric with flattening right > left.  VIII: deferred X: gag reflex intact XI: deferred XII: unable to cooperate Motor: Plegic on the right. Some withdrawal of right UE to pain. No movement in right leg to deep nailbed pressure. Moves left arm/leg well and has 5/5 power in the left arm with no drift. Sensory: Sensation is diminished to noxious stimulation throughout right side, lesser degree on the left Deep Tendon Reflexes: areflexic Plantars: deferred Cerebellar: Unable to test  Discharge Diet      Diet   DIET DYS 2 Room service appropriate? No; Fluid consistency: Thin   liquids  DISCHARGE PLAN  Disposition:  Transfer to [facility] for ongoing PT, OT and ST  Eliquis (apixaban) daily for secondary stroke prevention.  Recommend ongoing stroke risk factor control by Primary Care Physician at time of discharge from  inpatient rehabilitation.  Follow-up PCP in 2 weeks following discharge from rehab.  Follow-up in [facility] in 4 weeks following discharge from rehab, office to schedule an appointment.   35 minutes were spent preparing discharge.  [name], MD PhD Stroke Neurology 07/18/2020 12:40 PM

## 2020-07-18 NOTE — Plan of Care (Signed)
Mod to max assist with adls.

## 2020-07-18 NOTE — Progress Notes (Addendum)
Pt's "son" called for updates, and upset that staff will not give him information. This RN only able to listen and give basic updates based on what he already knew w/o dx or care plan. The caller later spoke of family dynamics, which prompted me to request that he gather more information from his sister whom is on pt's file.

## 2020-07-18 NOTE — Progress Notes (Signed)
Physical Therapy Treatment Patient Details Name: Rachael Herrera MRN: [id number] DOB: Nov 26, 1960 Today's Date: 07/18/2020    History of Present Illness The pt is a 60 yo female presenting with right-sided weakenss and gaze preference. 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 Pt s/p thrombectomy 2/17.  PMH includes: DM II, OSA, CKD IV, HTN, HLD, and morbid obesity.    PT Comments    Pt continues to make progress with mobility independence, distance, and safety as she was able to ambulate x2 bouts of ~30 ft each bout with a RW and only minAx1 today. However, pt continues to display unsafe gait deviations and impulsive tendencies that place her at risk for falls, such as premature sitting before fully turned anterior to sitting surface and neglect/inattention of R side allowing for R hand to slip off RW and R foot to get lateral to leg of RW, especially during turns to the R. Pt has difficulty tracking eyes across midline and maintaining R gaze. Will continue to follow acutely. Current recommendations remain appropriate.  Follow Up Recommendations  CIR     Equipment Recommendations   (defer to post acute)    Recommendations for Other Services       Precautions / Restrictions Precautions Precautions: Fall Precaution Comments: R inattention Restrictions Weight Bearing Restrictions: No    Mobility  Bed Mobility Overal bed mobility: Needs Assistance Bed Mobility: Supine to Sit;Sit to Supine     Supine to sit: HOB elevated;Min guard Sit to supine: Min guard   General bed mobility comments: Extra time and effort but pt able to perform bed mob with use of controls and rails with min guard today. Cues for hand placement on rails and to attend to R side.    Transfers Overall transfer level: Needs assistance Equipment used: Rolling walker (2 wheeled) Transfers: Sit to/from Stand Sit to Stand: From elevated surface;Min assist          General transfer comment: Sit to stand 2x from EOB with minA for stability and cueing pt to push up at least with L hand on bed rather than pull up on RW.  Ambulation/Gait Ambulation/Gait assistance: Min assist;Mod assist Gait Distance (Feet): 30 Feet (x2 bouts) Assistive device: Rolling walker (2 wheeled) Gait Pattern/deviations: Step-through pattern;Decreased step length - right;Decreased stride length;Wide base of support Gait velocity: decreased Gait velocity interpretation: <1.8 ft/sec, indicate of risk for recurrent falls General Gait Details: Unsteady, slow gait with decreased R step length and foot clearance. Poor attention to R side as needed continual reminders to look at R hand and foot to ensure safe positioning. R hand tends to slip from RW and R foot gets lateral to RW leg during turns towards the R. Premature sitting on bed before fully turning each time despite cues, needing min-modA for safety.   Stairs             Wheelchair Mobility    Modified Rankin (Stroke Patients Only) Modified Rankin (Stroke Patients Only) Pre-Morbid Rankin Score: No significant disability Modified Rankin: Moderately severe disability     Balance Overall balance assessment: Needs assistance Sitting-balance support: Feet supported;No upper extremity supported Sitting balance-Leahy Scale: Fair     Standing balance support: Bilateral upper extremity supported;During functional activity Standing balance-Leahy Scale: Poor Standing balance comment: External assistance and UE support on RW.  Cognition Arousal/Alertness: Awake/alert Behavior During Therapy: Impulsive Overall Cognitive Status: Impaired/Different from baseline Area of Impairment: Following commands;Safety/judgement;Awareness;Memory;Attention;Problem solving                   Current Attention Level: Sustained Memory: Decreased short-term memory;Decreased recall of  precautions Following Commands: Follows one step commands inconsistently;Follows one step commands with increased time Safety/Judgement: Decreased awareness of safety;Decreased awareness of deficits Awareness: Emergent Problem Solving: Slow processing;Requires verbal cues;Requires tactile cues General Comments: Pt impulsive in starting transitions prior to being cued at times and especially with premature sitting. Needs repeated simple commands to direct pt to attend to R side and maintain safety.      Exercises      General Comments        Pertinent Vitals/Pain Pain Assessment: No/denies pain Pain Intervention(s): Monitored during session    Home Living                      Prior Function            PT Goals (current goals can now be found in the care plan section) Acute Rehab PT Goals Patient Stated Goal: did not state but agreeable to gait training PT Goal Formulation: With patient Time For Goal Achievement: 07/26/20 Potential to Achieve Goals: Good Progress towards PT goals: Progressing toward goals    Frequency    Min 4X/week      PT Plan Current plan remains appropriate    Co-evaluation              AM-PAC PT "6 Clicks" Mobility   Outcome Measure  Help needed turning from your back to your side while in a flat bed without using bedrails?: A Little Help needed moving from lying on your back to sitting on the side of a flat bed without using bedrails?: A Little Help needed moving to and from a bed to a chair (including a wheelchair)?: A Little Help needed standing up from a chair using your arms (e.g., wheelchair or bedside chair)?: A Little Help needed to walk in hospital room?: A Little Help needed climbing 3-5 steps with a railing? : A Lot 6 Click Score: 17    End of Session Equipment Utilized During Treatment: Gait belt Activity Tolerance: Patient tolerated treatment well Patient left: with call bell/phone within reach;in bed;with bed  alarm set   PT Visit Diagnosis: Muscle weakness (generalized) (M62.81);Other abnormalities of gait and mobility (R26.89);Unsteadiness on feet (R26.81);Difficulty in walking, not elsewhere classified (R26.2)     Time: [id number] PT Time Calculation (min) (ACUTE ONLY): 10 min  Charges:  $Gait Training: 8-22 mins                     Moishe Spice, PT, DPT Acute Rehabilitation Services  Pager: [phone] Office: [place] 07/18/2020, 4:44 PM

## 2020-07-19 LAB — COMPREHENSIVE METABOLIC PANEL
ALT: 16 U/L (ref 0–44)
AST: 17 U/L (ref 15–41)
Albumin: 3.3 g/dL — ABNORMAL LOW (ref 3.5–5.0)
Alkaline Phosphatase: 76 U/L (ref 38–126)
Anion gap: 14 (ref 5–15)
BUN: 32 mg/dL — ABNORMAL HIGH (ref 8–23)
CO2: 25 mmol/L (ref 22–32)
Calcium: 8.8 mg/dL — ABNORMAL LOW (ref 8.9–10.3)
Chloride: 95 mmol/L — ABNORMAL LOW (ref 98–111)
Creatinine, Ser: 7.66 mg/dL — ABNORMAL HIGH (ref 0.44–1.00)
GFR, Estimated: 6 mL/min — ABNORMAL LOW (ref >60)
Glucose, Bld: 192 mg/dL — ABNORMAL HIGH (ref 70–99)
Potassium: 4.2 mmol/L (ref 3.5–5.1)
Sodium: 134 mmol/L — ABNORMAL LOW (ref 135–145)
Total Bilirubin: 0.8 mg/dL (ref 0.3–1.2)
Total Protein: 7 g/dL (ref 6.5–8.1)

## 2020-07-19 LAB — GLUCOSE, CAPILLARY
Glucose-Capillary: 181 mg/dL — ABNORMAL HIGH (ref 70–99)
Glucose-Capillary: 189 mg/dL — ABNORMAL HIGH (ref 70–99)
Glucose-Capillary: 226 mg/dL — ABNORMAL HIGH (ref 70–99)
Glucose-Capillary: 235 mg/dL — ABNORMAL HIGH (ref 70–99)

## 2020-07-19 LAB — CBC WITH DIFFERENTIAL/PLATELET
Abs Immature Granulocytes: 0.04 10*3/uL (ref 0.00–0.07)
Basophils Absolute: 0.1 10*3/uL (ref 0.0–0.1)
Basophils Relative: 1 %
Eosinophils Absolute: 0.2 10*3/uL (ref 0.0–0.5)
Eosinophils Relative: 4 %
HCT: 35.9 % — ABNORMAL LOW (ref 36.0–46.0)
Hemoglobin: 11.3 g/dL — ABNORMAL LOW (ref 12.0–15.0)
Immature Granulocytes: 1 %
Lymphocytes Relative: 22 %
Lymphs Abs: 1.4 10*3/uL (ref 0.7–4.0)
MCH: 30.4 pg (ref 26.0–34.0)
MCHC: 31.5 g/dL (ref 30.0–36.0)
MCV: 96.5 fL (ref 80.0–100.0)
Monocytes Absolute: 1.1 10*3/uL — ABNORMAL HIGH (ref 0.1–1.0)
Monocytes Relative: 17 %
Neutro Abs: 3.6 10*3/uL (ref 1.7–7.7)
Neutrophils Relative %: 55 %
Platelets: 216 10*3/uL (ref 150–400)
RBC: 3.72 MIL/uL — ABNORMAL LOW (ref 3.87–5.11)
RDW: 15.1 % (ref 11.5–15.5)
WBC: 6.4 10*3/uL (ref 4.0–10.5)
nRBC: 0 % (ref 0.0–0.2)

## 2020-07-19 MED ORDER — INSULIN GLARGINE 100 UNIT/ML ~~LOC~~ SOLN
16.0000 [IU] | Freq: Two times a day (BID) | SUBCUTANEOUS | Status: DC
  Administered 2020-07-19 – 2020-07-21 (×4): 16 [IU] via SUBCUTANEOUS
  Filled 2020-07-19 (×6): qty 0.16

## 2020-07-19 MED ORDER — CHLORHEXIDINE GLUCONATE CLOTH 2 % EX PADS
6.0000 | MEDICATED_PAD | Freq: Every day | CUTANEOUS | Status: DC
  Administered 2020-07-20 – 2020-07-23 (×4): 6 via TOPICAL

## 2020-07-19 MED ORDER — POLYETHYLENE GLYCOL 3350 17 G PO PACK: 17.0000 g | PACK | Freq: Every day | ORAL | Status: DC | PRN

## 2020-07-19 NOTE — Progress Notes (Signed)
Nephrology Follow-Up Consult note     Interval History/Subjective:   last HD on 2/24 with 1.8 kg UF.  Patient offers no hx due to aphasia.  Her daughter is at bedside and speaking with SW.  I commented that if this ordeal has seemed to have been frustrating for the patient - especially with the aphasia - that I'm sure that she could reach out to the team re: something for depression.  Her daughter was very frustrated by this comment and said that they've been through this before and we're not going to talk about depression.  I apologized for bringing that up and excused myself from the room.  Review of systems: limited by aphasia   Medications:  Current Facility-Administered Medications  Medication Dose Route Frequency Provider Last Rate Last Admin  . acetaminophen (TYLENOL) tablet 325-650 mg  325-650 mg Oral Q4H PRN Love, Pamela S, PA-C      . aluminum hydroxide (AMPHOJEL/ALTERNAGEL) suspension 10 mL  10 mL Oral Q6H PRN Love, Pamela S, PA-C      . amitriptyline (ELAVIL) tablet 25 mg  25 mg Oral Jackelyn Poling, MD   25 mg at 07/18/20 2203  . amLODipine (NORVASC) tablet 10 mg  10 mg Oral Daily [name], MD   10 mg at 07/19/20 0759  . apixaban (ELIQUIS) tablet 5 mg  5 mg Oral BID Meredith Staggers, MD   5 mg at 07/19/20 0759  . atorvastatin (LIPITOR) tablet 80 mg  80 mg Oral Daily [name], MD   80 mg at 07/19/20 0759  . bisacodyl (DULCOLAX) suppository 10 mg  10 mg Rectal Daily PRN Bary Leriche, PA-C      . Chlorhexidine Gluconate Cloth 2 % PADS 6 each  6 each Topical Daily Love, Pamela S, PA-C      . cinacalcet (SENSIPAR) tablet 60 mg  60 mg Oral Q breakfast [name], MD   60 mg at 07/19/20 0759  . diphenhydrAMINE (BENADRYL) 12.5 MG/5ML elixir 12.5-25 mg  12.5-25 mg Oral Q6H PRN Love, Pamela S, PA-C      . feeding supplement (NEPRO CARB STEADY) liquid 237 mL  237 mL Oral TID BM [name], MD   237 mL at 07/19/20 1330  . guaiFENesin-dextromethorphan (ROBITUSSIN DM) 100-10 MG/5ML  syrup 5-10 mL  5-10 mL Oral Q6H PRN Love, Pamela S, PA-C      . insulin aspart (novoLOG) injection 0-5 Units  0-5 Units Subcutaneous QHS Raulkar, Clide Deutscher, MD   2 Units at 07/18/20 2202  . insulin aspart (novoLOG) injection 0-9 Units  0-9 Units Subcutaneous TID WC Raulkar, Clide Deutscher, MD   2 Units at 07/19/20 1223  . insulin glargine (LANTUS) injection 16 Units  16 Units Subcutaneous BID Raulkar, Clide Deutscher, MD      . levothyroxine (SYNTHROID) tablet 75 mcg  75 mcg Oral QAC breakfast [name], MD   75 mcg at 07/19/20 0519  . losartan (COZAAR) tablet 50 mg  50 mg Oral Daily [name], MD   50 mg at 07/19/20 0759  . MEDLINE mouth rinse  15 mL Mouth Rinse BID [name], MD   15 mL at 07/19/20 0804  . milk and molasses enema  1 enema Rectal Daily PRN Love, Pamela S, PA-C      . multivitamin (RENA-VIT) tablet 1 tablet  1 tablet Oral QHS [name], MD   1 tablet at 07/18/20 2203  . nystatin (MYCOSTATIN) 100000 UNIT/ML suspension 500,000 Units  5 mL  Oral QID [name], MD   500,000 Units at 07/19/20 1223  . pneumococcal 23 valent vaccine (PNEUMOVAX-23) injection 0.5 mL  0.5 mL Intramuscular Tomorrow-1000 Alger Simons T, MD      . polyethylene glycol (MIRALAX / GLYCOLAX) packet 17 g  17 g Oral Daily PRN Meredith Staggers, MD      . prochlorperazine (COMPAZINE) tablet 5-10 mg  5-10 mg Oral Q6H PRN Love, Pamela S, PA-C       Or  . prochlorperazine (COMPAZINE) injection 5-10 mg  5-10 mg Intramuscular Q6H PRN Love, Pamela S, PA-C       Or  . prochlorperazine (COMPAZINE) suppository 12.5 mg  12.5 mg Rectal Q6H PRN Love, Pamela S, PA-C      . senna-docusate (Senokot-S) tablet 1 tablet  1 tablet Oral QHS PRN [name], MD      . sevelamer carbonate (RENVELA) tablet 800 mg  800 mg Oral TID WC [name], MD   800 mg at 07/19/20 1223  . traZODone (DESYREL) tablet 25-50 mg  25-50 mg Oral QHS PRN Flora Lipps        Physical Exam: Vitals:   07/19/20 0436 07/19/20 1255  BP: (!) 148/54 (!)  153/58  Pulse: (!) 47 (!) 45  Resp: 18 17  Temp: 97.7 F (36.5 C) 97.8 F (36.6 C)  SpO2: 93% 96%   Total I/O In: 200 [P.O.:200] Out: -   Intake/Output Summary (Last 24 hours) at 07/19/2020 1530 Last data filed at 07/19/2020 1300 Gross per 24 hour  Intake 200 ml  Output --  Net 200 ml   General adult female in bed in no acute distress HEENT normocephalic atraumatic Neck supple trachea midline Lungs clear to auscultation bilaterally normal work of breathing at rest  Heart S1S2 no rub Abdomen soft nontender nondistended; obese habitus Extremities no pitting edema  Psych no anxiety or agitation  Neuro aphasia/word finding difficulty - does not speak but nods/shakes head - seems more consistently interactive today.  Access AVF LUE      Test Results I personally reviewed new and old clinical labs and radiology tests Lab Results  Component Value Date   NA 134 (L) 07/19/2020   K 4.2 07/19/2020   CL 95 (L) 07/19/2020   CO2 25 07/19/2020   BUN 32 (H) 07/19/2020   CREATININE 7.66 (H) 07/19/2020   CALCIUM 8.8 (L) 07/19/2020   ALBUMIN 3.3 (L) 07/19/2020   PHOS 4.4 07/12/2020    Outpt Records: NEPHROLOGIST: Cay Schillings MD  LOCATION: [facility]  SCHEDULE: T-Th-S 1st Shift  EDW: 273 kg.  KIDNEY: [facility] F200N  LITERS PROC: 73.5 liters/treatment  HD TIME: 210  ACCESS: R IJ PC  NEEDLE SIZE:  ANTICOAG: Standard Heparin Per Protocol  BATH: 3.0CA-HCO3  QB: 350 ml/min  QD: 700 ml/min   Assessment/Recommendations: AZERA MCCLARIN is a/an 60 y.o. female with a past medical history significant for ESRD, admitted for CVA after dialysis.     ESRD: - HD per TTS schedule while here  Hyperkalemia: resolved with Lokelma and HD  Hypertension/volume: optimize volume with HD  Anemia: hgb acceptable; defer ESA setting of recent CVA  Secondary hyperparathyroidism: Would continue Sensipar 60 mg daily  DM2: mgmt per primary  CVA: s/p thrombectomy. Mgmt per  primary  Bradycardia - noted. Confirmed not on beta blocker.  Per primary team   Claudia Desanctis, MD 07/19/2020 3:45 PM

## 2020-07-19 NOTE — Plan of Care (Signed)
Problem: RH Balance Goal: LTG: Patient will maintain dynamic sitting balance (OT) Description: LTG:  Patient will maintain dynamic sitting balance with assistance during activities of daily living (OT) Flowsheets (Taken 07/19/2020 1045) LTG: Pt will maintain dynamic sitting balance during ADLs with: Independent Goal: LTG Patient will maintain dynamic standing with ADLs (OT) Description: LTG:  Patient will maintain dynamic standing balance with assist during activities of daily living (OT)  Flowsheets (Taken 07/19/2020 1045) LTG: Pt will maintain dynamic standing balance during ADLs with: Independent with assistive device   Problem: Sit to Stand Goal: LTG:  Patient will perform sit to stand in prep for activites of daily living with assistance level (OT) Description: LTG:  Patient will perform sit to stand in prep for activites of daily living with assistance level (OT) Flowsheets (Taken 07/19/2020 1045) LTG: PT will perform sit to stand in prep for activites of daily living with assistance level: Independent with assistive device   Problem: RH Eating Goal: LTG Patient will perform eating w/assist, cues/equip (OT) Description: LTG: Patient will perform eating with assist, with/without cues using equipment (OT) Flowsheets (Taken 07/19/2020 1045) LTG: Pt will perform eating with assistance level of: Set up assist    Problem: RH Grooming Goal: LTG Patient will perform grooming w/assist,cues/equip (OT) Description: LTG: Patient will perform grooming with assist, with/without cues using equipment (OT) Flowsheets (Taken 07/19/2020 1045) LTG: Pt will perform grooming with assistance level of: Set up assist    Problem: RH Bathing Goal: LTG Patient will bathe all body parts with assist levels (OT) Description: LTG: Patient will bathe all body parts with assist levels (OT) Flowsheets (Taken 07/19/2020 1045) LTG: Pt will perform bathing with assistance level/cueing: Supervision/Verbal cueing    Problem: RH Dressing Goal: LTG Patient will perform upper body dressing (OT) Description: LTG Patient will perform upper body dressing with assist, with/without cues (OT). Flowsheets (Taken 07/19/2020 1045) LTG: Pt will perform upper body dressing with assistance level of: Independent with assistive device Goal: LTG Patient will perform lower body dressing w/assist (OT) Description: LTG: Patient will perform lower body dressing with assist, with/without cues in positioning using equipment (OT) Flowsheets (Taken 07/19/2020 1045) LTG: Pt will perform lower body dressing with assistance level of: Supervision/Verbal cueing   Problem: RH Toileting Goal: LTG Patient will perform toileting task (3/3 steps) with assistance level (OT) Description: LTG: Patient will perform toileting task (3/3 steps) with assistance level (OT)  Flowsheets (Taken 07/19/2020 1045) LTG: Pt will perform toileting task (3/3 steps) with assistance level: Supervision/Verbal cueing   Problem: RH Functional Use of Upper Extremity Goal: LTG Patient will use RT/LT upper extremity as a (OT) Description: LTG: Patient will use right/left upper extremity as a stabilizer/gross assist/diminished/nondominant/dominant level with assist, with/without cues during functional activity (OT) Flowsheets (Taken 07/19/2020 1045) LTG: Use of upper extremity in functional activities: RUE as nondominant level   Problem: RH Toilet Transfers Goal: LTG Patient will perform toilet transfers w/assist (OT) Description: LTG: Patient will perform toilet transfers with assist, with/without cues using equipment (OT) Flowsheets (Taken 07/19/2020 1045) LTG: Pt will perform toilet transfers with assistance level of: Supervision/Verbal cueing   Problem: RH Tub/Shower Transfers Goal: LTG Patient will perform tub/shower transfers w/assist (OT) Description: LTG: Patient will perform tub/shower transfers with assist, with/without cues using equipment  (OT) Flowsheets (Taken 07/19/2020 1045) LTG: Pt will perform tub/shower stall transfers with assistance level of: Supervision/Verbal cueing   Problem: RH Awareness Goal: LTG: Patient will demonstrate awareness during functional activites type of (OT) Description: LTG: Patient  will demonstrate awareness during functional activites type of (OT) Flowsheets (Taken 07/19/2020 1045) LTG: Patient will demonstrate awareness during functional activites type of (OT): Supervision

## 2020-07-19 NOTE — Evaluation (Signed)
Physical Therapy Assessment and Plan  Patient Details  Name: Rachael Herrera MRN: [id number] Date of Birth: Dec 08, 1960  PT Diagnosis: Difficulty walking, Hemiplegia dominant and Muscle weakness Rehab Potential: Good ELOS: 2-2.5 weeks   Today's Date: 07/19/2020 PT Individual Time: 1104-1200 PT Individual Time Calculation (min): 88 min    Hospital Problem: Principal Problem:   Acute ischemic left MCA stroke [facility])   Past Medical History:  Past Medical History:  Diagnosis Date   Cavitary pneumonia 06/2017   due to MSSA   Claudication of both lower extremities ([place])    DDD (degenerative disc disease), cervical    DDD (degenerative disc disease), lumbosacral    Diabetes mellitus ([place])    Diabetic neuropathy ([place])    ESRD (end stage renal disease) on dialysis ([place])    GERD (gastroesophageal reflux disease)    NSVT (nonsustained ventricular tachycardia) ([place]) 10/20201   OSA on CPAP    Ulnar neuropathy    BUE   Vitamin B 12 deficiency    Past Surgical History:  Past Surgical History:  Procedure Laterality Date   IR ANGIO VERTEBRAL SEL VERTEBRAL UNI L MOD SED  07/11/2020   IR CT HEAD LTD  07/11/2020   IR PERCUTANEOUS ART THROMBECTOMY/INFUSION INTRACRANIAL INC DIAG ANGIO  07/11/2020   IR US GUIDE VASC ACCESS LEFT  07/12/2020   RADIOLOGY WITH ANESTHESIA N/A 07/11/2020   Procedure: IR WITH ANESTHESIA;  Surgeon: Radiologist, Medication, MD;  Location: [place];  Service: Radiology;  Laterality: N/A;    Assessment & Plan Clinical Impression: Patient is a 60 year old female with history of HTN, T2DM with peripheral neuropathy, remote seizures due to hypoglycemia, Tachybrady syndrome--failed attempts at [facility] 03/2020, OSA, seizure disorder, who was admitted on 07/11/20 with right sided weakness with left gaze prefrence and inability to communicate. Per reports patient had been seen at [facility] after bradycardia with dizziness and left eye blindness and plans for PPM but she left without  CT head as was feeling better and elected to wait for PPM on outpatient basis. CTA head showed acute left occipital lobe infarct, suspected left caudate infarct and nonocclusive thrombus at [facility] bifurcation and occlusion of left M2 MCA branch. She underwent cerebral angio with mechanical thrombectomy and TICI 3 revascularization. Follow up MRI brain showed acute ischemic infarcts in L-MCA/PCA territories with small amount of petechial blood in left  basal ganglia. She developed atrial flutter and Dr. Quentin Ore recommended [facility] once cleared by neurology.  She was started on IV heparin on 02/18 and transitioned to eliquis. Episodic hypoglycemia has resolved with initiation of diet. BP treated with cleviprex and medications added back for better control. Heart rate has been stable in 40's for past 24 hours and cardiology reconsulted 02/23--> "no urgent need for pacing and can be visited if interfering with therapy". She continues to have limitations due to global aphasia with right sided weakness with sensory deficits, left gaze preference affecting ADLs and mobility.  Patient transferred to CIR on 07/18/2020 .   Patient currently requires min with mobility secondary to muscle weakness, decreased cardiorespiratoy endurance, motor apraxia and decreased motor planning, decreased attention to right, decreased attention, decreased awareness, decreased problem solving and decreased safety awareness and decreased standing balance, decreased postural control, hemiplegia and decreased balance strategies.  Prior to hospitalization, patient was independent  with mobility and lived with Daughter in a House home.  Home access is 8Stairs to enter.  Patient will benefit from skilled PT intervention to maximize safe functional mobility, minimize fall  risk and decrease caregiver burden for planned discharge home with 24 hour supervision.  Anticipate patient will benefit from follow up OP at discharge.  PT - End of Session Activity  Tolerance: Tolerates 30+ min activity with multiple rests Endurance Deficit: Yes Endurance Deficit Description: Rest breaks frequently with mobility tasks PT Assessment Rehab Potential (ACUTE/IP ONLY): Good PT Barriers to Discharge: Home environment access/layout;Hemodialysis PT Patient demonstrates impairments in the following area(s): Balance;Behavior;Endurance;Motor;Pain;Perception;Safety PT Transfers Functional Problem(s): Bed Mobility;Bed to Chair;Car;Furniture PT Locomotion Functional Problem(s): Ambulation PT Plan PT Intensity: Minimum of 1-2 x/day ,45 to 90 minutes PT Frequency: 5 out of 7 days PT Duration Estimated Length of Stay: 2-2.5 weeks PT Treatment/Interventions: Ambulation/gait training;Community reintegration;DME/adaptive equipment instruction;Neuromuscular re-education;Psychosocial support;Stair training;UE/LE Strength taining/ROM;UE/LE Coordination activities;Therapeutic Activities;Skin care/wound management;Pain management;Discharge planning;Balance/vestibular training;Cognitive remediation/compensation;Disease management/prevention;Functional mobility training;Patient/family education;Therapeutic Exercise;Visual/perceptual remediation/compensation;Functional electrical stimulation PT Transfers Anticipated Outcome(s): Supervision PT Locomotion Anticipated Outcome(s): Supervision PT Recommendation Follow Up Recommendations: 24 hour supervision/assistance;Outpatient PT Patient destination: Home Equipment Recommended: To be determined   PT Evaluation Precautions/Restrictions Precautions Precautions: Fall Precaution Comments: R inattention Restrictions Weight Bearing Restrictions: No General Chart Reviewed: Yes Family/Caregiver Present: No Vital Signs Pain Pain Assessment Pain Scale: 0-10 Home Living/Prior Functioning Home Living Available Help at Discharge: Family Type of Home: House Home Access: Stairs to enter Technical brewer of Steps: 8 Entrance  Stairs-Rails: Can reach both;Right;Left Home Layout: One level  Lives With: Daughter Prior Function Level of Independence: Independent with homemaking with ambulation;Independent with basic ADLs  Able to Take Stairs?: Yes Driving: Yes Comments: pt driving herself to dialysis prior to admission Vision/Perception  Perception Perception: Impaired Praxis Praxis: Impaired Praxis Impairment Details: Motor planning  Cognition Overall Cognitive Status: Impaired/Different from baseline Arousal/Alertness: Awake/alert Orientation Level: Oriented to person (unable to verify due to aphasia) Safety/Judgment: Impaired Comments: Unable to obtain orientation questions 2/2 expressive aphasia. Pt was able to follow 75% of one step commands but did seem to demonstrate some receptive language difficulties as well. Pt impulsive with poor safey awareness within BADL sesson. Sensation Sensation Light Touch: Appears Intact Coordination Gross Motor Movements are Fluid and Coordinated: No Fine Motor Movements are Fluid and Coordinated: No Coordination and Movement Description: decreased smoothness and accuracy wih R Hand and R lower extremity Motor  Motor Motor: Hemiplegia  Trunk/Postural Assessment  Cervical Assessment Cervical Assessment:  (forward head) Thoracic Assessment Thoracic Assessment:  (rounded shoulders) Lumbar Assessment Lumbar Assessment:  (posterior pelvic tilt) Postural Control Postural Control: Deficits on evaluation Protective Responses: delayed and inadequate  Balance Balance Balance Assessed: Yes Static Sitting Balance Static Sitting - Balance Support: Feet supported Static Sitting - Level of Assistance: 5: Stand by assistance Dynamic Sitting Balance Dynamic Sitting - Balance Support: During functional activity Dynamic Sitting - Level of Assistance: 5: Stand by assistance Static Standing Balance Static Standing - Balance Support: During functional activity Static Standing  - Level of Assistance: 4: Min assist Dynamic Standing Balance Dynamic Standing - Balance Support: During functional activity Dynamic Standing - Level of Assistance: 4: Min assist Extremity Assessment      RLE Assessment RLE Assessment: Exceptions to [facility] Strength Comments: Grossly 4/5. Limited by motor planning deficits LLE Assessment LLE Assessment: Within Functional Limits  Care Tool Care Tool Bed Mobility Roll left and right activity   Roll left and right assist level: Minimal Assistance - Patient > 75%    Sit to lying activity   Sit to lying assist level: Minimal Assistance - Patient > 75%    Lying to sitting edge of  bed activity   Lying to sitting edge of bed assist level: Minimal Assistance - Patient > 75%     Care Tool Transfers Sit to stand transfer   Sit to stand assist level: Minimal Assistance - Patient > 75%    Chair/bed transfer   Chair/bed transfer assist level: Minimal Assistance - Patient > 75%     Toilet transfer   Assist Level: Minimal Assistance - Patient > 75%    Car transfer   Car transfer assist level: Minimal Assistance - Patient > 75%      Care Tool Locomotion Ambulation   Assist level: Moderate Assistance - Patient 50 - 74% Assistive device: Hand held assist Max distance: 100'  Walk 10 feet activity   Assist level: Moderate Assistance - Patient - 50 - 74% Assistive device: Hand held assist   Walk 50 feet with 2 turns activity   Assist level: Moderate Assistance - Patient - 50 - 74% Assistive device: Hand held assist  Walk 150 feet activity Walk 150 feet activity did not occur: Safety/medical concerns      Walk 10 feet on uneven surfaces activity   Assist level: Moderate Assistance - Patient - 50 - 74% Assistive device: Hand held assist  Stairs   Assist level: Minimal Assistance - Patient > 75% Stairs assistive device: 2 hand rails Max number of stairs: 4  Walk up/down 1 step activity   Walk up/down 1 step (curb) assist  level: Minimal Assistance - Patient > 75% Walk up/down 1 step or curb assistive device: 2 hand rails    Walk up/down 4 steps activity Walk up/down 4 steps assist level: Minimal Assistance - Patient > 75% Walk up/down 4 steps assistive device: 2 hand rails  Walk up/down 12 steps activity Walk up/down 12 steps activity did not occur: Safety/medical concerns      Pick up small objects from floor Pick up small object from the floor (from standing position) activity did not occur: Safety/medical concerns      Wheelchair Will patient use wheelchair at discharge?: No          Wheel 50 feet with 2 turns activity      Wheel 150 feet activity        Refer to Care Plan for Long Term Goals  SHORT TERM GOAL WEEK 1 PT Short Term Goal 1 (Week 1): Pt will perform bed mobility with CGA. PT Short Term Goal 2 (Week 1): Pt will perform bed to chair wtih CGA. PT Short Term Goal 3 (Week 1): Pt will ambulate 150' with minA. PT Short Term Goal 4 (Week 1): PT will perform 8 steps with BHRs and minA.  Recommendations for other services: None   Skilled Therapeutic Intervention  Evaluation completed (see details above and below) with education on PT POC and goals and individual treatment initiated with focus on bed mobility, balance, transfers, ambulation, and stair training. Pt received supine in bed and agrees to therapy. No complaint of pain. Pt able to nod or shake head ~50% of time appropriately when asked questions. Supine to sit with minA. MinA for sit to stand and stand step transfer to [facility]. WC transport to gym for time management. Pt completes car transfer with minA and PT cues for sequencing and positioning. Pt ascends/descends ramp with modA and use of hand rails, demonstrating impulsivity by reaching far outside BOS for hand rail and flexing forward multiple times during ramp navigation. Pt ambulates x100' with modA for several lateral LOBs and reaching out  for objects to support herself. Following  extended seated rest break, pt completes x4 6" steps with B hand rails and minA.   Stand step transfer back to bed with minA. Sit to supine with minA. Left supine in bed with alarm intact and all needs within reach.   Mobility Bed Mobility Bed Mobility: Supine to Sit;Sit to Supine Supine to Sit: Minimal Assistance - Patient > 75% Sit to Supine: Minimal Assistance - Patient > 75% Transfers Transfers: Stand Pivot Transfers;Stand to Sit;Sit to Stand Sit to Stand: Minimal Assistance - Patient > 75% Stand to Sit: Minimal Assistance - Patient > 75% Stand Pivot Transfers: Minimal Assistance - Patient > 75% Stand Pivot Transfer Details: Verbal cues for technique;Verbal cues for precautions/safety;Tactile cues for posture;Tactile cues for sequencing;Tactile cues for initiation Transfer (Assistive device): None Locomotion  Gait Ambulation: Yes Gait Assistance: Moderate Assistance - Patient 50-74% Gait Distance (Feet): 100 Feet Assistive device: None Gait Assistance Details: Verbal cues for sequencing;Verbal cues for precautions/safety;Tactile cues for posture;Manual facilitation for weight shifting;Verbal cues for technique Gait Gait: Yes Gait Pattern: Wide base of support;Decreased stride length Gait velocity: decreased Stairs / Additional Locomotion Stairs: Yes Stairs Assistance: Minimal Assistance - Patient > 75% Stair Management Technique: Two rails Number of Stairs: 4 Height of Stairs: 6 Ramp: Moderate Assistance - Patient 50 - 74% Curb: Minimal Assistance - Patient >75% Wheelchair Mobility Wheelchair Mobility: No   Discharge Criteria: Patient will be discharged from PT if patient refuses treatment 3 consecutive times without medical reason, if treatment goals not met, if there is a change in medical status, if patient makes no progress towards goals or if patient is discharged from hospital.  The above assessment, treatment plan, treatment alternatives and goals were discussed  and mutually agreed upon: by patient  Breck Coons, PT, DPT 07/19/2020, 12:53 PM

## 2020-07-19 NOTE — Evaluation (Addendum)
Occupational Therapy Assessment and Plan  Patient Details  Name: Rachael Herrera MRN: [id number] Date of Birth: 1961-03-21  OT Diagnosis: apraxia, ataxia, cognitive deficits, hemiplegia affecting dominant side and muscle weakness (generalized) Rehab Potential: Rehab Potential (ACUTE ONLY): Excellent ELOS: 12-14 days   Today's Date: 07/19/2020 OT Individual Time: 1003-1103 OT Individual Time Calculation (min): 60 min     Hospital Problem: Principal Problem:   Acute ischemic left MCA stroke [facility])   Past Medical History:  Past Medical History:  Diagnosis Date  . Cavitary pneumonia 06/2017   due to MSSA  . Claudication of both lower extremities ([place])   . DDD (degenerative disc disease), cervical   . DDD (degenerative disc disease), lumbosacral   . Diabetes mellitus ([place])   . Diabetic neuropathy ([place])   . ESRD (end stage renal disease) on dialysis ([place])   . GERD (gastroesophageal reflux disease)   . NSVT (nonsustained ventricular tachycardia) ([place]) 10/20201  . OSA on CPAP   . Ulnar neuropathy    BUE  . Vitamin B 12 deficiency    Past Surgical History:  Past Surgical History:  Procedure Laterality Date  . IR ANGIO VERTEBRAL SEL VERTEBRAL UNI L MOD SED  07/11/2020  . IR CT HEAD LTD  07/11/2020  . IR PERCUTANEOUS ART THROMBECTOMY/INFUSION INTRACRANIAL INC DIAG ANGIO  07/11/2020  . IR US GUIDE VASC ACCESS LEFT  07/12/2020  . RADIOLOGY WITH ANESTHESIA N/A 07/11/2020   Procedure: IR WITH ANESTHESIA;  Surgeon: Radiologist, Medication, MD;  Location: [place];  Service: Radiology;  Laterality: N/A;    Assessment & Plan Clinical Impression: Patient is a 60 y.o. year old female with recent admission to the hospital on 07/11/20 with right sided weakness with left gaze prefrence and inability to communicate. Per reports patient had been seen at [facility] after bradycardia with dizziness and left eye blindness and plans for PPM but she left without CT head as was feeling better and elected to wait for  PPM on outpatient basis. CTA head showed acute left occipital lobe infarct, suspected left caudate infarct and nonocclusive thrombus at [facility] bifurcation and occlusion of left M2 MCA branch. She underwent cerebral angio with mechanical thrombectomy and TICI 3 revascularization. Follow up MRI brain showed acute ischemic infarcts in L-MCA/PCA territories with small amount of petechial blood in left  basal ganglia. She developed atrial flutter and Dr. Quentin Ore recommended [facility] once cleared by neurology.  She was started on IV heparin on 02/18 and transitioned to eliquis. Episodic hypoglycemia has resolved with initiation of diet. BP treated with cleviprex and medications added back for better control. Heart rate has been stable in 40's for past 24 hours and cardiology reconsulted 02/23--> "no urgent need for pacing and can be visited if interfering with therapy". She continues to have limitations due to global aphasia with right sided weakness with sensory deficits, left gaze preference affecting ADLs and mobility. CIR recommended due to functional decline. She currently has no complaints, receiving HD, moaning .  Patient transferred to CIR on 07/18/2020 .    Patient currently requires mod with basic self-care skills secondary to muscle weakness, decreased cardiorespiratoy endurance, impaired timing and sequencing, motor apraxia, ataxia, decreased coordination and decreased motor planning, decreased attention to right, decreased attention, decreased awareness, decreased problem solving and decreased safety awareness and decreased sitting balance, decreased standing balance, decreased postural control, hemiplegia and decreased balance strategies.  Prior to hospitalization, patient could complete BADL with independent .  Patient will benefit from skilled intervention to increase  independence with basic self-care skills prior to discharge home with care partner.  Anticipate patient will require 24 hour supervision and  follow up home health.  OT - End of Session Endurance Deficit: Yes Endurance Deficit Description: Rest breaks within BADL tasks OT Assessment Rehab Potential (ACUTE ONLY): Excellent OT Patient demonstrates impairments in the following area(s): Balance;Behavior;Cognition;Endurance;Motor;Perception;Safety;Vision OT Basic ADL's Functional Problem(s): Bathing;Eating;Grooming;Dressing;Toileting OT Transfers Functional Problem(s): Toilet;Tub/Shower OT Additional Impairment(s): Fuctional Use of Upper Extremity OT Plan OT Intensity: Minimum of 1-2 x/day, 45 to 90 minutes OT Frequency: 5 out of 7 days OT Duration/Estimated Length of Stay: 14-18 days OT Treatment/Interventions: Balance/vestibular training;Cognitive remediation/compensation;Community reintegration;Discharge planning;Disease mangement/prevention;DME/adaptive equipment instruction;Functional electrical stimulation;Functional mobility training;Neuromuscular re-education;Patient/family education;Psychosocial support;Self Care/advanced ADL retraining;Pain management;Splinting/orthotics;Therapeutic Activities;Therapeutic Exercise;UE/LE Strength taining/ROM;UE/LE Coordination activities;Skin care/wound managment;Wheelchair propulsion/positioning;Visual/perceptual remediation/compensation OT Self Feeding Anticipated Outcome(s): se-up A OT Basic Self-Care Anticipated Outcome(s): Supervision/mod I OT Toileting Anticipated Outcome(s): Supervision OT Bathroom Transfers Anticipated Outcome(s): Supervision OT Recommendation Patient destination: Home Follow Up Recommendations: Home health OT Equipment Recommended: To be determined   OT Evaluation Precautions/Restrictions  Precautions Precautions: Fall Precaution Comments: R inattention Restrictions Weight Bearing Restrictions: No Pain   denies pain Home Living/Prior Functioning Home Living Family/patient expects to be discharged to:: Private residence Living Arrangements:  Children Available Help at Discharge: Family Type of Home: House Home Access: Stairs to enter Technical brewer of Steps: 8 Entrance Stairs-Rails: Can reach both,Right,Left Home Layout: One level Bathroom Shower/Tub: Multimedia programmer: Handicapped height Bathroom Accessibility: Yes Additional Comments: history taken from chart review  Lives With: Daughter Prior Function Level of Independence: Independent with homemaking with ambulation,Independent with basic ADLs Comments: pt driving herself to dialysis prior to admission Vision Vision Assessment?: Vision impaired- to be further tested in functional context Eye Alignment: Impaired (comment) Tracking/Visual Pursuits: Requires cues, head turns, or add eye shifts to track;Impaired - to be further tested in functional context Perception  Perception: Impaired Inattention/Neglect:  (mild R inatention) Cognition Overall Cognitive Status: Impaired/Different from baseline Arousal/Alertness: Awake/alert Orientation Level: Other(comment) (UTA 2/2 expressive aphasia) Year:  (UTA 2/2 expressive aphasia) Memory:  (UTA 2/2 expressive aphasia) Attention: Sustained Sustained Attention: Impaired Sustained Attention Impairment: Verbal basic;Functional basic Awareness: Impaired Awareness Impairment: Emergent impairment Problem Solving: Impaired Problem Solving Impairment: Functional basic Behaviors: Impulsive Safety/Judgment: Impaired Comments: Unable to obtain orientation questions 2/2 expressive aphasia. Pt was able to follow 75% of one step commands but did seem to demonstrate some receptive language difficulties as well. Pt impulsive with poor safey awareness within BADL sesson. Sensation Sensation Light Touch: Not tested Coordination Gross Motor Movements are Fluid and Coordinated: No Fine Motor Movements are Fluid and Coordinated: No Coordination and Movement Description: decreased smoothness and accuracy wih R  Hand Motor  Motor Motor: Hemiplegia  Balance Balance Balance Assessed: Yes Static Sitting Balance Static Sitting - Balance Support: Feet supported Static Sitting - Level of Assistance: 5: Stand by assistance Dynamic Sitting Balance Dynamic Sitting - Balance Support: During functional activity Dynamic Sitting - Level of Assistance: 5: Stand by assistance Static Standing Balance Static Standing - Balance Support: During functional activity Static Standing - Level of Assistance: 4: Min assist Dynamic Standing Balance Dynamic Standing - Balance Support: During functional activity Dynamic Standing - Level of Assistance: 4: Min assist Extremity/Trunk Assessment RUE Assessment RUE Assessment: Exceptions to [facility] General Strength Comments: Strength 4-/5, deficit mostly in coordination and R attention RUE Body System: Neuro Brunstrum levels for arm and hand: Arm;Hand Brunstrum level for arm: Stage V Relative Independence from Synergy Brunstrum level  for hand: Stage VI Isolated joint movements LUE Assessment LUE Assessment: Within Functional Limits  Care Tool Care Tool Self Care Eating   Eating Assist Level: Minimal Assistance - Patient > 75%    Oral Care    Oral Care Assist Level: Minimal Assistance - Patient > 75%    Bathing   Body parts bathed by patient: Right arm;Chest;Left arm;Front perineal area;Abdomen;Face;Right upper leg;Left upper leg Body parts bathed by helper: Right lower leg;Left lower leg;Buttocks   Assist Level: Moderate Assistance - Patient 50 - 74%    Upper Body Dressing(including orthotics)   What is the patient wearing?: Pull over shirt   Assist Level: Moderate Assistance - Patient 50 - 74%    Lower Body Dressing (excluding footwear)   What is the patient wearing?: Pants Assist for lower body dressing: Maximal Assistance - Patient 25 - 49%    Putting on/Taking off footwear   What is the patient wearing?: Non-skid slipper socks Assist for footwear: Total  Assistance - Patient < 25%       Care Tool Toileting Toileting activity   Assist for toileting: Minimal Assistance - Patient > 75%     Care Tool Bed Mobility Roll left and right activity        Sit to lying activity   Sit to lying assist level: Minimal Assistance - Patient > 75%    Lying to sitting edge of bed activity   Lying to sitting edge of bed assist level: Minimal Assistance - Patient > 75%     Care Tool Transfers Sit to stand transfer   Sit to stand assist level: Minimal Assistance - Patient > 75%    Chair/bed transfer   Chair/bed transfer assist level: Minimal Assistance - Patient > 75%     Toilet transfer   Assist Level: Minimal Assistance - Patient > 75%     Care Tool Cognition Expression of Ideas and Wants Expression of Ideas and Wants: Frequent difficulty - frequently exhibits difficulty with expressing needs and ideas   Understanding Verbal and Non-Verbal Content Understanding Verbal and Non-Verbal Content: Sometimes understands - understands only basic conversations or simple, direct phrases. Frequently requires cues to understand   Memory/Recall Ability *first 3 days only Memory/Recall Ability *first 3 days only: That he or she is in a hospital/hospital unit    Refer to Care Plan for [place] 1 OT Short Term Goal 1 (Week 1): Pt will maintain grip on toothbrish with R hand while placing toothpaste OT Short Term Goal 2 (Week 1): Pt will complete 2 steps of toileting task OT Short Term Goal 3 (Week 1): Pt will maintain standing balance within BADL task for 2 minutes  Recommendations for other services: None    Skilled Therapeutic Intervention Pt greeted semi-reclined in bed, groaning, but agreeable by head nod to OT eval and treat. OT eval completed addressing rehab process, OT purpose, POC, ELOS, and goals. Unable to obtain orientation questions 2/2 expressive aphasia. Pt was able to follow 25%  of one step commands  demonstrating some receptive language difficulties as well. Pt impulsive with poor safey awareness within BADL sesson. Overall min A for functional stand-pivot transfers, Max A for LB ADLs, and Min/mod A for UB ADLs. Pt with decreased smoothness and accuracy when using R hand for functional tasks. See below for further details on BADL performance. Pt left semi-reclined in bed with bed alarm on, call bell in reach, and needs met.   ADL ADL  Grooming: Minimal assistance Upper Body Bathing: Minimal assistance;Moderate assistance Lower Body Bathing: Maximal assistance Upper Body Dressing: Moderate assistance Lower Body Dressing: Maximal assistance Toileting: Minimal assistance Toilet Transfer: Minimal assistance Mobility  Bed Mobility Supine to Sit: Minimal Assistance - Patient > 75% Sit to Supine: Minimal Assistance - Patient > 75% Transfers Sit to Stand: Minimal Assistance - Patient > 75% Stand to Sit: Minimal Assistance - Patient > 75%   Discharge Criteria: Patient will be discharged from OT if patient refuses treatment 3 consecutive times without medical reason, if treatment goals not met, if there is a change in medical status, if patient makes no progress towards goals or if patient is discharged from hospital.    The above assessment, treatment plan, treatment alternatives and goals were discussed and mutually agreed upon: No family available/patient unable  Valma Cava 07/19/2020, 10:59 AM

## 2020-07-19 NOTE — Progress Notes (Signed)
Inpatient Rehabilitation Care Coordinator Assessment and Plan Patient Details  Name: Rachael Herrera MRN: [id number] Date of Birth: 02-23-1961  Today's Date: 07/19/2020  Hospital Problems: Principal Problem:   Acute ischemic left MCA stroke [facility])  Past Medical History:  Past Medical History:  Diagnosis Date  . Cavitary pneumonia 06/2017   due to MSSA  . Claudication of both lower extremities ([place])   . DDD (degenerative disc disease), cervical   . DDD (degenerative disc disease), lumbosacral   . Diabetes mellitus ([place])   . Diabetic neuropathy ([place])   . ESRD (end stage renal disease) on dialysis ([place])   . GERD (gastroesophageal reflux disease)   . NSVT (nonsustained ventricular tachycardia) ([place]) 10/20201  . OSA on CPAP   . Ulnar neuropathy    BUE  . Vitamin B 12 deficiency    Past Surgical History:  Past Surgical History:  Procedure Laterality Date  . IR ANGIO VERTEBRAL SEL VERTEBRAL UNI L MOD SED  07/11/2020  . IR CT HEAD LTD  07/11/2020  . IR PERCUTANEOUS ART THROMBECTOMY/INFUSION INTRACRANIAL INC DIAG ANGIO  07/11/2020  . IR US GUIDE VASC ACCESS LEFT  07/12/2020  . RADIOLOGY WITH ANESTHESIA N/A 07/11/2020   Procedure: IR WITH ANESTHESIA;  Surgeon: Radiologist, Medication, MD;  Location: [place];  Service: Radiology;  Laterality: N/A;   Social History:  reports that she has never smoked. She has never used smokeless tobacco. She reports previous alcohol use. She reports previous drug use.  Family / Support Systems Marital Status: Single Patient Roles: Parent Spouse/Significant Other: N/A Children: 1 adult dtr- Doris Other Supports: various family members Anticipated Caregiver: dtr Doris Ability/Limitations of Caregiver: Intermittent support from dtr due to giving birth; dtr reports pt sister/brother to help as able too. Caregiver Availability: 24/7 Family Dynamics: Pt lives with her dtr and grandson (dtr to give birth within 1-2 weeks).  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 Education: some college Read: Yes Write: Yes Employment Status: Retired Date Retired/Disabled/Unemployed: Dtr reports pt retired 3 yrs ago Age Retired: 57 Public relations account executive Issues: Denies Guardian/Conservator: N/A   Abuse/Neglect Abuse/Neglect Assessment Can Be Completed: Unable to assess, patient is non-responsive or altered mental status (Pt unable to respond due to aphasia)  Emotional Status Pt's affect, behavior and adjustment status: Pt appeared to be in good spirits at time of visit Recent Psychosocial Issues: Pt dtr denied Psychiatric History: Pt dtr denies Substance Abuse History: Pt dtr denies  Patient / Family Perceptions, Expectations & Goals Pt/Family understanding of illness & functional limitations: Pt dtr has general understanding of care needs Premorbid pt/family roles/activities: Independent Anticipated changes in roles/activities/participation: Assistance with ADLs/IADLs Pt/family expectations/goals: Pt daughter's goal is to get pt "mobility back."  [employer]: None Premorbid Home Care/DME Agencies: None Transportation available at discharge: Pt dtr Resource referrals recommended: Neuropsychology  Discharge Planning Living Arrangements: Children Support Systems: Children,Parent Type of Residence: Private residence Insurance Resources: Medicaid (specify [employer] (specify) (UHC Medicare) Museum/gallery curator Resources: Scientist, physiological (Comment) (savings) Financial Screen Referred: No Living Expenses: Own Money Management: Patient Does the patient have any problems obtaining your medications?: No Home Management: Pt managed all homecare needs Patient/Family Preliminary Plans: Dtr to manage home care needs Care Coordinator Barriers to Discharge: Decreased caregiver support,Lack of/limited family support,Hemodialysis Care Coordinator  Anticipated Follow Up Needs: HH/OP  Clinical Impression SW met with pt and pt dtr Doris in room to introduce self, explain role, and discuss discharge process.  SW made attempts to complete assessment earlier, however due to aphasia, pt unable to complete. Pt dtr reports pt is not a English as a second language teacher. No HCPOA. DME:  motorized scooter([employer]). Pt dtr states pt was transporting self to/from dialysis; T[facility] 1st [facility]. Pt dtr mentioned pt was initially scheduled for pacemaker prior to admission (2/28). Pt dtr reports she is working creating a concrete walk way to back of home where it is flush with the ground, as there are steps from driveway (7 steps to deck, and another 4 steps and a long deck before entering the home). SW informed there will be follow-up after team conference on Tuesday.  Reagann Dolce A Oshea Percival 07/19/2020, 4:05 PM

## 2020-07-19 NOTE — Evaluation (Signed)
Speech Language Pathology Assessment and Plan  Patient Details  Name: Rachael Herrera MRN: [id number] Date of Birth: 1960/06/25  SLP Diagnosis: Apraxia;Aphasia;Dysphagia;Cognitive Impairments;Speech and Language deficits  Rehab Potential: Good ELOS: 2 - 2.5 weeks    Today's Date: 07/19/2020 SLP Individual Time: 0801-0900 SLP Individual Time Calculation (min): 59 min   Hospital Problem: Principal Problem:   Acute ischemic left MCA stroke [facility])  Past Medical History:  Past Medical History:  Diagnosis Date  . Cavitary pneumonia 06/2017   due to MSSA  . Claudication of both lower extremities ([place])   . DDD (degenerative disc disease), cervical   . DDD (degenerative disc disease), lumbosacral   . Diabetes mellitus ([place])   . Diabetic neuropathy ([place])   . ESRD (end stage renal disease) on dialysis ([place])   . GERD (gastroesophageal reflux disease)   . NSVT (nonsustained ventricular tachycardia) ([place]) 10/20201  . OSA on CPAP   . Ulnar neuropathy    BUE  . Vitamin B 12 deficiency    Past Surgical History:  Past Surgical History:  Procedure Laterality Date  . IR ANGIO VERTEBRAL SEL VERTEBRAL UNI L MOD SED  07/11/2020  . IR CT HEAD LTD  07/11/2020  . IR PERCUTANEOUS ART THROMBECTOMY/INFUSION INTRACRANIAL INC DIAG ANGIO  07/11/2020  . IR US GUIDE VASC ACCESS LEFT  07/12/2020  . RADIOLOGY WITH ANESTHESIA N/A 07/11/2020   Procedure: IR WITH ANESTHESIA;  Surgeon: Radiologist, Medication, MD;  Location: [place];  Service: Radiology;  Laterality: N/A;    Assessment / Plan / Recommendation Clinical Impression Rachael V Ellisonis a 60 y.o.right-handed femalewith history of hypertension as well as hyperlipidemia, end-stage renal disease with hemodialysis type 2 diabetes mellitus, NSVT /PVCs and bradycardia OSA, morbid obesity BMI 39.95. Presented 07/11/2020 with right side weakness gaze preference and inability to communicate. BP in the 170s. Of note she was seen 07/09/2020 in the ED at [facility] for another episode that occurred during dialysis as well as bouts of bradycardia. CT the head was suggested but she left without this test. Patient had been followed in the past for bradycardia consider plans for pacemaker. Cranial CT scan showed acute left occipital lobe infarction. Suspect acute infarction of the body of the left caudate. CTA showed no large vessel occlusion or hemodynamically significant stenosis in the neck. Nonocclusive thrombus at the left MCA bifurcation. Occlusion of proximal left M3 MCA branch. Patient underwent successful thrombectomy per interventional radiology for M2 occlusion. Echocardiogram with ejection fraction of 55 to 60% no wall motion abnormalities. Latest MRI imaging showed acute ischemic infarct within the left MCA and PCA territories with a small amount of petechial blood products in left basal ganglia. No midline shift or mass-effect as of 07/12/2020. Patient is currently maintained on intravenous heparin and cardiology continues to follow for patient's history of bradycardia as well as a flutter. She is currently on a dysphagia #2 thin liquid diet. Hemodialysis ongoing as per renal services. Therapy evaluations completed due to patient's right side weakness and inability to communicate recommendations for physical medicine rehab consult.  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 Pt is able to communicate greetings, social exchanges and filler words, with x1 occurrence of ability to repeat at word level. the rest of language is marked by neologistic and jargon paraphasia with no awareness of verbal errors. In automatic language task pt is able to maintain melody when singing "happy birthday" and counting  1-5 produce "2" only, with no ability to produce DOW of the week even in unison. Pt named 0 out 4 common objects (with no awareness of  errors), identified 1 out 5 objects in a field of 2 and matched word to object in a field of 2 in 4 out 5 opportunities.  Pt was only able to trace at word level during writing assessment (using non-dominant left hand), however on acute was noted to write first and last name. Receptive language and auditory comprehension was greater given a functional versus non functional task, for example pt could brush her teeth with following 60-70% of commands, however 1 step directions that were non-functional 20% accuracy. The same was true for response to yes/no questions, when expressing wants/needs pertaining to breakfast and positioning 60% accuracy but biographical/environmental questions 20% accuracy. Pt presents with oral apraxia, right side weakness and reduce sensation. Pt demonstrated mild-moderate pocketing with dys 2 textures and moderate pocketing with dys 3 textures. Pt was unable to follow commands to preform lingual or fingers sweeps. Residue was eventually removed with liquid washes and then oral care. Pt demonstrated no overt s/s aspiration of thin liquids via straw or cup with no anterior spillage when consuming small sips. SLP recommends continued diet of dys 2 textures and thin liquids (cup or straw), medication whole in puree with full supervision for oral clearance and oral care after meals.  Cognitive skills were not fully assessed due to severity of language deficits however poor intellectual/emergent awareness and problem solving skills were noted as well as occasional right side neglect. Pt would benefit from skilled ST services to maximize functional independence and reduce burden of care requiring 24 hour supervision and continued ST services at discharge.    Skilled Therapeutic Interventions          Skilled ST services focused on speech and language skills. SLP initiated melodic intonation therapy along with written cues, which improved fluency from neologisms to phonetic errors ( "how are  you" pt stated " owl are ew.") SLP also placed "help" sign on call bell since written cues for effective in other task, however pt still required demonstration cues to locate call bell. SLP reviewed plan for treatment. Pt was left with call bell within reach and bed alarm set. Recommend to continue skilled ST services.   SLP Assessment  Patient will need skilled Speech Lanaguage Pathology Services during CIR admission    Recommendations  SLP Diet Recommendations: Dysphagia 2 (Fine chop);Thin Liquid Administration via: Straw;Cup Medication Administration: Whole meds with puree Supervision: Staff to assist with self feeding;Full supervision/cueing for compensatory strategies Compensations: Minimize environmental distractions;Slow rate;Small sips/bites;Lingual sweep for clearance of pocketing Postural Changes and/or Swallow Maneuvers: Seated upright 90 degrees Oral Care Recommendations: Oral care BID;Oral care before and after PO Patient destination: Home Follow up Recommendations: 24 hour supervision/assistance;Home Health SLP;Outpatient SLP Equipment Recommended: None recommended by SLP    SLP Frequency 3 to 5 out of 7 days   SLP Duration  SLP Intensity  SLP Treatment/Interventions 2 - 2.5 weeks  Minumum of 1-2 x/day, 30 to 90 minutes  Cognitive remediation/compensation;Cueing hierarchy;Functional tasks;Environmental controls;Dysphagia/aspiration precaution training;Internal/external aids;Speech/Language facilitation;Patient/family education    Pain Pain Assessment Pain Scale: 0-10 Faces Pain Scale: No hurt  Prior Functioning Cognitive/Linguistic Baseline: Within functional limits Type of Home: House  Lives With: Daughter Available Help at Discharge: Family  SLP Evaluation Cognition Overall Cognitive Status: Impaired/Different from baseline Arousal/Alertness: Awake/alert Orientation Level: Oriented to person (unable to verify due to aphasia) Attention:  Focused Focused  Attention: Appears intact Memory:  (aphasia difficult to assess) Awareness: Impaired Awareness Impairment: Emergent impairment;Intellectual impairment Problem Solving: Impaired Problem Solving Impairment: Functional basic Safety/Judgment: Impaired Comments: Unable to obtain orientation questions 2/2 expressive aphasia. Pt was able to follow 75% of one step commands but did seem to demonstrate some receptive language difficulties as well. Pt impulsive with poor safey awareness within BADL sesson.  Comprehension Auditory Comprehension Overall Auditory Comprehension: Impaired Yes/No Questions: Impaired Basic Biographical Questions: 0-25% accurate (wants/needs appear to be 50%) Basic Immediate Environment Questions: 0-24% accurate Commands: Impaired One Step Basic Commands: 0-24% accurate (functional 60%) Conversation: Simple Visual Recognition/Discrimination Discrimination: Exceptions to WFL (1/5) Reading Comprehension Reading Status: Impaired Word level: Impaired (match word to obect in 4 out 5. read at word level neologistic paraphasia) Expression Expression Primary Mode of Expression: Verbal Verbal Expression Overall Verbal Expression: Impaired Initiation: Impaired Automatic Speech: Social Response Level of Generative/Spontaneous Verbalization: Word Repetition: Impaired Level of Impairment: Word level Naming: Impairment Confrontation: Impaired (0/4) Verbal Errors: Neologisms;Jargon;Not aware of errors Pragmatics: No impairment Oral Motor Oral Motor/Sensory Function Overall Oral Motor/Sensory Function: Mild impairment Facial ROM: Reduced right;Suspected CN VII (facial) dysfunction Facial Symmetry: Abnormal symmetry right;Suspected CN VII (facial) dysfunction Facial Strength: Reduced right;Suspected CN VII (facial) dysfunction Facial Sensation: Reduced right;Suspected CN V (Trigeminal) dysfunction Motor Speech Overall Motor Speech: Appears within functional limits for tasks  assessed Respiration: Within functional limits Phonation: Normal Resonance: Within functional limits Intelligibility: Intelligibility reduced Word: 0-24% accurate Motor Planning: Impaired Level of Impairment: Word Motor Speech Errors: Unaware  Care Tool Care Tool Cognition Expression of Ideas and Wants Expression of Ideas and Wants: Frequent difficulty - frequently exhibits difficulty with expressing needs and ideas   Understanding Verbal and Non-Verbal Content Understanding Verbal and Non-Verbal Content: Sometimes understands - understands only basic conversations or simple, direct phrases. Frequently requires cues to understand   Memory/Recall Ability *first 3 days only Memory/Recall Ability *first 3 days only: That he or she is in a hospital/hospital unit     PMSV Assessment  PMSV Trial Intelligibility: Intelligibility reduced Word: 0-24% accurate  Bedside Swallowing Assessment General Date of Onset: 07/11/20 Previous Swallow Assessment: none Diet Prior to this Study: Dysphagia 2 (chopped);Thin liquids Respiratory Status: Room air History of Recent Intubation: Yes Date extubated: 07/11/20 Behavior/Cognition: Alert;Cooperative;Requires cueing Oral Cavity - Dentition: Adequate natural dentition Self-Feeding Abilities: Able to feed self;Needs assist Vision: Functional for self-feeding Patient Positioning: Upright in bed Baseline Vocal Quality: Normal Volitional Cough: Other (Comment) Volitional Swallow: Unable to elicit  Oral Care Assessment Does patient have any of the following "high(er) risk" factors?: None of the above Does patient have any of the following "at risk" factors?: Other - dysphagia Patient is HIGH RISK: Non-ventilated: Order set for Adult Oral Care Protocol initiated - "High Risk Patients - Non-Ventilated" option selected  (see row information) Patient is AT RISK: Order set for Adult Oral Care Protocol initiated -  "At Risk Patients" option selected (see  row information) Ice Chips Ice chips: Not tested Thin Liquid Thin Liquid: Within functional limits Presentation: Cup;Straw;Self Fed Nectar Thick Nectar Thick Liquid: Not tested Honey Thick Honey Thick Liquid: Not tested Puree Puree: Within functional limits Presentation: Spoon Oral Phase Impairments: Reduced lingual movement/coordination Solid Solid: Impaired Oral Phase Impairments: Reduced lingual movement/coordination Oral Phase Functional Implications: Prolonged oral transit;Right lateral sulci pocketing BSE Assessment Risk for Aspiration Impact on safety and function: Mild aspiration risk;Moderate aspiration risk Other Related Risk Factors: Deconditioning  Short Term Goals: Week 1: SLP Short  Term Goal 1 (Week 1): Pt improve verbal fluency with automatic language as well as using melodic intonation strategies for simple phrases with 40% accuracy with max A multimodal cues. SLP Short Term Goal 2 (Week 1): Pt respond to yes/no questions pertaining to biographical information/immediate environment  and wants/needs with 60% accuracy with max A multimodal cues. SLP Short Term Goal 3 (Week 1): Pt will follow 1 step commands with 50% accuracy with max A multimodal cues. SLP Short Term Goal 4 (Week 1): Pt will match word to object in a field of 2 with 90% accuracy with min A semantic cues. SLP Short Term Goal 5 (Week 1): Pt will clear right buccal pocketing with max A verbal cues of dys 2 textures. SLP Short Term Goal 6 (Week 1): Pt will demonstrate awareness of verbal and functional errors with max A multimodal cues in 10% of opportunities.  Refer to Care Plan for Long Term Goals  Recommendations for other services: None   Discharge Criteria: Patient will be discharged from SLP if patient refuses treatment 3 consecutive times without medical reason, if treatment goals not met, if there is a change in medical status, if patient makes no progress towards goals or if patient is discharged  from hospital.  The above assessment, treatment plan, treatment alternatives and goals were discussed and mutually agreed upon: by patient  Mikinzie Maciejewski  [facility] 07/19/2020, 1:36 PM

## 2020-07-19 NOTE — Progress Notes (Signed)
PROGRESS NOTE   Subjective/Complaints: No complaints this morning Discussed with SLP, has expressive>receptive aphasia Did better job following commands today, still inconsistent She did well with matching as per SLP.   ROS: Denies pain Objective:   No results found. Recent Labs    07/18/20 0321 07/19/20 0505  WBC 7.4 6.4  HGB 10.7* 11.3*  HCT 33.8* 35.9*  PLT 214 216   Recent Labs    07/18/20 0321 07/19/20 0505  NA 132* 134*  K 4.5 4.2  CL 93* 95*  CO2 25 25  GLUCOSE 237* 192*  BUN 44* 32*  CREATININE 9.38* 7.66*  CALCIUM 8.7* 8.8*    Intake/Output Summary (Last 24 hours) at 07/19/2020 1016 Last data filed at 07/19/2020 0900 Gross per 24 hour  Intake 120 ml  Output --  Net 120 ml        Physical Exam: Vital Signs Blood pressure (!) 148/54, pulse (!) 47, temperature 97.7 F (36.5 C), temperature source Oral, resp. rate 18, height '5\' 8"'$  (1.727 m), weight 118.7 kg, SpO2 93 %. Gen: no distress, normal appearing HEENT: oral mucosa pink and moist, NCAT Cardio:    Rate and Rhythm: Bradycardia present.  Abdomen: Obese, no tenderness to palpation Extremities: n Neurological:     Mental Status: She is alert.     Comments: Globally aphasic--tends to make humming yes sounds to  most questions. She was able to follow simple motor commands with tactile cues but unable to perform MMT and was unable to point to items. RUE>RLE weakness.    Assessment/Plan: 1. Functional deficits which require 3+ hours per day of interdisciplinary therapy in a comprehensive inpatient rehab setting.  Physiatrist is providing close team supervision and 24 hour management of active medical problems listed below.  Physiatrist and rehab team continue to assess barriers to discharge/monitor patient progress toward functional and medical goals  Care Tool:  Bathing              Bathing assist       Upper Body  Dressing/Undressing Upper body dressing        Upper body assist      Lower Body Dressing/Undressing Lower body dressing            Lower body assist       Toileting Toileting    Toileting assist Assist for toileting: Minimal Assistance - Patient > 75%     Transfers Chair/bed transfer  Transfers assist           Locomotion Ambulation   Ambulation assist              Walk 10 feet activity   Assist           Walk 50 feet activity   Assist           Walk 150 feet activity   Assist           Walk 10 feet on uneven surface  activity   Assist           Wheelchair     Assist               Wheelchair 50 feet with  2 turns activity    Assist            Wheelchair 150 feet activity     Assist          Blood pressure (!) 148/54, pulse (!) 47, temperature 97.7 F (36.5 C), temperature source Oral, resp. rate 18, height '5\' 8"'$  (1.727 m), weight 118.7 kg, SpO2 93 %.    Medical Problem List and Plan: 1.  Ischemic left MCA stroke             -patient may shower             -ELOS/Goals: S 8-10 days  -Initial CIR evaluations today.  2.  Antithrombotics: -DVT/anticoagulation:  Pharmaceutical: Other (comment)--Continue Eliquis             -antiplatelet therapy: N/A 3. Pain Management: Tylenol prn. 4. Mood: LCSW to follow for evaluation and support.              -antipsychotic agents: N/A 5. Neuropsych: This patient is capable of making decisions on her own behalf. 6. Skin/Wound Care: Routine pressure relief measures.  7. Fluids/Electrolytes/Nutrition: Strict I/O. Monitor weights daily              --Renal/CM diet with 1200 cc/FR 8. ESRD: Schedule HD at the end of the day to help with tolerance of therapy.             -- TTS during hospitalization.              --hyponatremia stable and hyperkalemia resolved (post lokelma/HD).  9. A lutter/Bradycardia: Has failed attempts at R-PPM and to follow up with  primary cardiology for leadless pacemaker             --has been bradycardic, continue to monitor HR tid (Norvasc back on board) 10. HTN: Monitor BP tid --Amlodipine and Cozaar resumed on 02/23. Systolic BP elevated and diastolic soft, continue as per nephrology recommendations             --monitor for recurrent bradycardia. 11. T2DM: Hgb A1C- 8.5. Was on Lantus 80 units/hs PTA.              --Monitor BS ac/hs and titrate lantus for better control.              CBGS 175-236: increase Lantus to 16U 12. OSA: Resume CPAP--question compliance. 13. Obesity (BMI 39.15): provide dietary counseling  LOS: 1 days A FACE TO FACE EVALUATION WAS PERFORMED  Rachael Herrera 07/19/2020, 10:16 AM

## 2020-07-20 LAB — GLUCOSE, CAPILLARY
Glucose-Capillary: 150 mg/dL — ABNORMAL HIGH (ref 70–99)
Glucose-Capillary: 197 mg/dL — ABNORMAL HIGH (ref 70–99)
Glucose-Capillary: 211 mg/dL — ABNORMAL HIGH (ref 70–99)
Glucose-Capillary: 209 mg/dL — ABNORMAL HIGH (ref 70–99)

## 2020-07-20 NOTE — Progress Notes (Signed)
PROGRESS NOTE   Subjective/Complaints:  Pt nonverbal but shook head no to if she had pain.  "bowels OK- per nursing".   .   ROS: limited by communication/aphasia Objective:   No results found. Recent Labs    07/18/20 0321 07/19/20 0505  WBC 7.4 6.4  HGB 10.7* 11.3*  HCT 33.8* 35.9*  PLT 214 216   Recent Labs    07/18/20 0321 07/19/20 0505  NA 132* 134*  K 4.5 4.2  CL 93* 95*  CO2 25 25  GLUCOSE 237* 192*  BUN 44* 32*  CREATININE 9.38* 7.66*  CALCIUM 8.7* 8.8*    Intake/Output Summary (Last 24 hours) at 07/20/2020 1752 Last data filed at 07/20/2020 0830 Gross per 24 hour  Intake 100 ml  Output --  Net 100 ml        Physical Exam: Vital Signs Blood pressure 136/76, pulse (!) 46, temperature 97.9 F (36.6 C), temperature source Oral, resp. rate 14, height '5\' 8"'$  (1.727 m), weight 113.6 kg, SpO2 96 %. / General: awake, alert, appropriate, NAD- sitting up in bed HENT: ; oropharynx moist CV: bradycardic- regular rhythm right now Pulmonary: CTA B/L; no W/R/R- good air movement GI: soft, NT, ND, (+)BS Psychiatric: flat, nonverbal due to aphasia Neurological: nonverbal due to aphasia  Extremities: n Neurological:     Mental Status: She is alert. And awake, nonverbal today  but unable to perform MMT and was unable to point to items. RUE>RLE weakness.    Assessment/Plan: 1. Functional deficits which require 3+ hours per day of interdisciplinary therapy in a comprehensive inpatient rehab setting.  Physiatrist is providing close team supervision and 24 hour management of active medical problems listed below.  Physiatrist and rehab team continue to assess barriers to discharge/monitor patient progress toward functional and medical goals  Care Tool:  Bathing    Body parts bathed by patient: Right arm,Chest,Left arm,Front perineal area,Abdomen,Face,Right upper leg,Left upper leg   Body parts bathed by  helper: Right lower leg,Left lower leg,Buttocks     Bathing assist Assist Level: Moderate Assistance - Patient 50 - 74%     Upper Body Dressing/Undressing Upper body dressing   What is the patient wearing?: Pull over shirt    Upper body assist Assist Level: Moderate Assistance - Patient 50 - 74%    Lower Body Dressing/Undressing Lower body dressing      What is the patient wearing?: Pants     Lower body assist Assist for lower body dressing: Maximal Assistance - Patient 25 - 49%     Toileting Toileting    Toileting assist Assist for toileting: Minimal Assistance - Patient > 75%     Transfers Chair/bed transfer  Transfers assist     Chair/bed transfer assist level: Minimal Assistance - Patient > 75%     Locomotion Ambulation   Ambulation assist      Assist level: Moderate Assistance - Patient 50 - 74% Assistive device: Hand held assist Max distance: 100'   Walk 10 feet activity   Assist     Assist level: Moderate Assistance - Patient - 50 - 74% Assistive device: Hand held assist   Walk 50 feet activity  Assist    Assist level: Moderate Assistance - Patient - 50 - 74% Assistive device: Hand held assist    Walk 150 feet activity   Assist Walk 150 feet activity did not occur: Safety/medical concerns         Walk 10 feet on uneven surface  activity   Assist     Assist level: Moderate Assistance - Patient - 50 - 74% Assistive device: Hand held assist   Wheelchair     Assist Will patient use wheelchair at discharge?: No             Wheelchair 50 feet with 2 turns activity    Assist            Wheelchair 150 feet activity     Assist          Blood pressure 136/76, pulse (!) 46, temperature 97.9 F (36.6 C), temperature source Oral, resp. rate 14, height '5\' 8"'$  (1.727 m), weight 113.6 kg, SpO2 96 %.    Medical Problem List and Plan: 1.  Ischemic left MCA stroke             -patient may shower              -ELOS/Goals: S 8-10 days  -Initial CIR evaluations today.  2.  Antithrombotics: -DVT/anticoagulation:  Pharmaceutical: Other (comment)--Continue Eliquis             -antiplatelet therapy: N/A 3. Pain Management: Tylenol prn. 4. Mood: LCSW to follow for evaluation and support.              -antipsychotic agents: N/A 5. Neuropsych: This patient is capable of making decisions on her own behalf. 6. Skin/Wound Care: Routine pressure relief measures.  7. Fluids/Electrolytes/Nutrition: Strict I/O. Monitor weights daily              --Renal/CM diet with 1200 cc/FR 8. ESRD: Schedule HD at the end of the day to help with tolerance of therapy.             -- TTS during hospitalization.              --hyponatremia stable and hyperkalemia resolved (post lokelma/HD).   2/26- Dialysis T/H/S- went this afternoon 9. A flutter/Bradycardia: Has failed attempts at R-PPM and to follow up with primary cardiology for leadless pacemaker             --has been bradycardic, continue to monitor HR tid (Norvasc back on board) 10. HTN: Monitor BP tid --Amlodipine and Cozaar resumed on 02/23. Systolic BP elevated and diastolic soft, continue as per nephrology recommendations  2/26- Bradycardic to 47 today- BP well controlled- con't regimen              --monitor for recurrent bradycardia. 11. T2DM: Hgb A1C- 8.5. Was on Lantus 80 units/hs PTA.              --Monitor BS ac/hs and titrate lantus for better control.              CBGS 175-236: increase Lantus to 16U  2/26- BGs 150-235- won't make changes today since just done yesterday- will monitor and con't SSI as well as scheduled insulin 12. OSA: Resume CPAP--question compliance. 13. Obesity (BMI 39.15): provide dietary counseling  LOS: 2 days A FACE TO FACE EVALUATION WAS PERFORMED  Rachael Herrera 07/20/2020, 5:52 PM

## 2020-07-20 NOTE — Progress Notes (Signed)
Nephrology Follow-Up Consult note     Interval History/Subjective:   last HD on 2/24 with 1.8 kg UF.   Hasn't had HD yet today.  She does not provide additional hx due to aphasia.    Review of systems: limited by aphasia   Medications:  Current Facility-Administered Medications  Medication Dose Route Frequency Provider Last Rate Last Admin  . acetaminophen (TYLENOL) tablet 325-650 mg  325-650 mg Oral Q4H PRN Love, Pamela S, PA-C      . amitriptyline (ELAVIL) tablet 25 mg  25 mg Oral Jackelyn Poling, MD   25 mg at 07/19/20 2206  . amLODipine (NORVASC) tablet 10 mg  10 mg Oral Daily [name], MD   10 mg at 07/19/20 0759  . apixaban (ELIQUIS) tablet 5 mg  5 mg Oral BID Meredith Staggers, MD   5 mg at 07/20/20 [phone]  . atorvastatin (LIPITOR) tablet 80 mg  80 mg Oral Daily [name], MD   80 mg at 07/19/20 0759  . bisacodyl (DULCOLAX) suppository 10 mg  10 mg Rectal Daily PRN Bary Leriche, PA-C      . Chlorhexidine Gluconate Cloth 2 % PADS 6 each  6 each Topical Daily Love, Ivan Anchors, PA-C      . Chlorhexidine Gluconate Cloth 2 % PADS 6 each  6 each Topical Q0600 Claudia Desanctis, MD   6 each at 07/20/20 [phone]  . cinacalcet (SENSIPAR) tablet 60 mg  60 mg Oral Q breakfast [name], MD   60 mg at 07/20/20 [id number]  . diphenhydrAMINE (BENADRYL) 12.5 MG/5ML elixir 12.5-25 mg  12.5-25 mg Oral Q6H PRN Love, Pamela S, PA-C      . feeding supplement (NEPRO CARB STEADY) liquid 237 mL  237 mL Oral TID BM [name], MD   237 mL at 07/19/20 1330  . guaiFENesin-dextromethorphan (ROBITUSSIN DM) 100-10 MG/5ML syrup 5-10 mL  5-10 mL Oral Q6H PRN Love, Pamela S, PA-C      . insulin aspart (novoLOG) injection 0-5 Units  0-5 Units Subcutaneous QHS Raulkar, Clide Deutscher, MD   2 Units at 07/19/20 2206  . insulin aspart (novoLOG) injection 0-9 Units  0-9 Units Subcutaneous TID WC Raulkar, Clide Deutscher, MD   2 Units at 07/20/20 1222  . insulin glargine (LANTUS) injection 16 Units  16 Units Subcutaneous BID Izora Ribas, MD   16 Units at 07/20/20 [phone]  . levothyroxine (SYNTHROID) tablet 75 mcg  75 mcg Oral QAC breakfast [name], MD   75 mcg at 07/20/20 0536  . losartan (COZAAR) tablet 50 mg  50 mg Oral Daily [name], MD   50 mg at 07/19/20 0759  . MEDLINE mouth rinse  15 mL Mouth Rinse BID [name], MD   15 mL at 07/20/20 1217  . milk and molasses enema  1 enema Rectal Daily PRN Love, Pamela S, PA-C      . multivitamin (RENA-VIT) tablet 1 tablet  1 tablet Oral QHS [name], MD   1 tablet at 07/19/20 2206  . nystatin (MYCOSTATIN) 100000 UNIT/ML suspension 500,000 Units  5 mL Oral QID [name], MD   500,000 Units at 07/20/20 1222  . pneumococcal 23 valent vaccine (PNEUMOVAX-23) injection 0.5 mL  0.5 mL Intramuscular Tomorrow-1000 Alger Simons T, MD      . polyethylene glycol (MIRALAX / GLYCOLAX) packet 17 g  17 g Oral Daily PRN Meredith Staggers, MD      . prochlorperazine (COMPAZINE) tablet 5-10 mg  5-10 mg Oral Q6H PRN Love, Pamela S, PA-C       Or  . prochlorperazine (COMPAZINE) injection 5-10 mg  5-10 mg Intramuscular Q6H PRN Love, Pamela S, PA-C       Or  . prochlorperazine (COMPAZINE) suppository 12.5 mg  12.5 mg Rectal Q6H PRN Love, Pamela S, PA-C      . senna-docusate (Senokot-S) tablet 1 tablet  1 tablet Oral QHS PRN [name], MD      . sevelamer carbonate (RENVELA) tablet 800 mg  800 mg Oral TID WC [name], MD   800 mg at 07/20/20 1222  . traZODone (DESYREL) tablet 25-50 mg  25-50 mg Oral QHS PRN Flora Lipps        Physical Exam: Vitals:   07/19/20 1959 07/20/20 0438  BP: (!) 137/57 (!) 152/53  Pulse: (!) 48 (!) 46  Resp: 18 20  Temp: 98.2 F (36.8 C) 97.8 F (36.6 C)  SpO2: 100% 99%   No intake/output data recorded.  Intake/Output Summary (Last 24 hours) at 07/20/2020 1352 Last data filed at 07/19/2020 1700 Gross per 24 hour  Intake 10 ml  Output --  Net 10 ml   General adult female in bed in no acute distress  HEENT normocephalic  atraumatic Neck supple trachea midline Lungs clear to auscultation bilaterally normal work of breathing at rest  Heart S1S2 no rub Abdomen soft nontender nondistended; obese habitus Extremities no pitting edema  Psych no anxiety or agitation  Neuro aphasia/word finding difficulty - does not speak but nods/shakes head or shrugs shoulders to answer Access AVF LUE  Bruit and thrill     Test Results I personally reviewed new and old clinical labs and radiology tests Lab Results  Component Value Date   NA 134 (L) 07/19/2020   K 4.2 07/19/2020   CL 95 (L) 07/19/2020   CO2 25 07/19/2020   BUN 32 (H) 07/19/2020   CREATININE 7.66 (H) 07/19/2020   CALCIUM 8.8 (L) 07/19/2020   ALBUMIN 3.3 (L) 07/19/2020   PHOS 4.4 07/12/2020    Outpt Records: NEPHROLOGIST: Cay Schillings MD  LOCATION: [facility]  SCHEDULE: T-Th-S 1st Shift  EDW: 273 kg.  KIDNEY: [facility] F200N  LITERS PROC: 73.5 liters/treatment  HD TIME: 210  ACCESS: R IJ PC  NEEDLE SIZE:  ANTICOAG: Standard Heparin Per Protocol  BATH: 3.0CA-HCO3  QB: 350 ml/min  QD: 700 ml/min   Assessment/Recommendations: Rachael Herrera is a/an 60 y.o. female with a past medical history significant for ESRD, admitted for CVA after dialysis.     ESRD: - HD per TTS schedule while here - renal panel in am  Hyperkalemia: resolved with Lokelma and HD  Hypertension/volume: optimize volume with HD  Anemia: hgb acceptable; also defer ESA setting of recent CVA  Secondary hyperparathyroidism: Would continue Sensipar 60 mg daily. On renvela  DM2: mgmt per primary  CVA: s/p thrombectomy. Mgmt per primary  Bradycardia - noted. Confirmed not on beta blocker.  Per primary team   Claudia Desanctis, MD 07/20/2020 1:59 PM

## 2020-07-21 LAB — GLUCOSE, CAPILLARY
Glucose-Capillary: 150 mg/dL — ABNORMAL HIGH (ref 70–99)
Glucose-Capillary: 261 mg/dL — ABNORMAL HIGH (ref 70–99)
Glucose-Capillary: 238 mg/dL — ABNORMAL HIGH (ref 70–99)
Glucose-Capillary: 253 mg/dL — ABNORMAL HIGH (ref 70–99)

## 2020-07-21 LAB — RENAL FUNCTION PANEL
Albumin: 3.5 g/dL (ref 3.5–5.0)
Anion gap: 16 — ABNORMAL HIGH (ref 5–15)
BUN: 32 mg/dL — ABNORMAL HIGH (ref 8–23)
CO2: 25 mmol/L (ref 22–32)
Calcium: 9 mg/dL (ref 8.9–10.3)
Chloride: 96 mmol/L — ABNORMAL LOW (ref 98–111)
Creatinine, Ser: 8.14 mg/dL — ABNORMAL HIGH (ref 0.44–1.00)
GFR, Estimated: 5 mL/min — ABNORMAL LOW (ref >60)
Glucose, Bld: 144 mg/dL — ABNORMAL HIGH (ref 70–99)
Phosphorus: 5.1 mg/dL — ABNORMAL HIGH (ref 2.5–4.6)
Potassium: 3.9 mmol/L (ref 3.5–5.1)
Sodium: 137 mmol/L (ref 135–145)

## 2020-07-21 MED ORDER — LOSARTAN POTASSIUM 50 MG PO TABS
100.0000 mg | ORAL_TABLET | Freq: Every day | ORAL | Status: DC
  Administered 2020-07-22 – 2020-07-23 (×2): 100 mg via ORAL
  Filled 2020-07-21 (×2): qty 2

## 2020-07-21 MED ORDER — INSULIN GLARGINE 100 UNIT/ML ~~LOC~~ SOLN
18.0000 [IU] | Freq: Two times a day (BID) | SUBCUTANEOUS | Status: DC
  Administered 2020-07-21 – 2020-07-23 (×4): 18 [IU] via SUBCUTANEOUS
  Filled 2020-07-21 (×5): qty 0.18

## 2020-07-21 NOTE — Progress Notes (Addendum)
PROGRESS NOTE   Subjective/Complaints:  Pt did nod her head that had no issues.  Did get IVs out of LUE-    ROS: limited by aphasia Objective:   No results found. Recent Labs    07/19/20 0505  WBC 6.4  HGB 11.3*  HCT 35.9*  PLT 216   Recent Labs    07/19/20 0505 07/21/20 0528  NA 134* 137  K 4.2 3.9  CL 95* 96*  CO2 25 25  GLUCOSE 192* 144*  BUN 32* 32*  CREATININE 7.66* 8.14*  CALCIUM 8.8* 9.0    Intake/Output Summary (Last 24 hours) at 07/21/2020 1521 Last data filed at 07/21/2020 0804 Gross per 24 hour  Intake 240 ml  Output 3000 ml  Net -2760 ml        Physical Exam: Vital Signs Blood pressure (!) 136/48, pulse (!) 44, temperature 97.8 F (36.6 C), resp. rate 17, height '5\' 8"'$  (1.727 m), weight 112.2 kg, SpO2 95 %.   General: awake, sitting up in bed; nonverbal, NAD HENT:mouth a little dry CV: very bradycardic- regular rhythm Pulmonary: CTA B/L; no W/R/R- good air movement GI: soft, NT, ND, (+)BS Psychiatric:nonverbal, flat, quiet Neurological: aphasic Neurological:     Mental Status: She is alert. And awake, nonverbal today  but unable to perform MMT and was unable to point to items. RUE>RLE weakness.    Assessment/Plan: 1. Functional deficits which require 3+ hours per day of interdisciplinary therapy in a comprehensive inpatient rehab setting.  Physiatrist is providing close team supervision and 24 hour management of active medical problems listed below.  Physiatrist and rehab team continue to assess barriers to discharge/monitor patient progress toward functional and medical goals  Care Tool:  Bathing    Body parts bathed by patient: Right arm,Chest,Left arm,Front perineal area,Abdomen,Face,Right upper leg,Left upper leg   Body parts bathed by helper: Right lower leg,Left lower leg,Buttocks     Bathing assist Assist Level: Moderate Assistance - Patient 50 - 74%     Upper Body  Dressing/Undressing Upper body dressing   What is the patient wearing?: Pull over shirt    Upper body assist Assist Level: Moderate Assistance - Patient 50 - 74%    Lower Body Dressing/Undressing Lower body dressing      What is the patient wearing?: Pants     Lower body assist Assist for lower body dressing: Maximal Assistance - Patient 25 - 49%     Toileting Toileting    Toileting assist Assist for toileting: Minimal Assistance - Patient > 75%     Transfers Chair/bed transfer  Transfers assist     Chair/bed transfer assist level: Minimal Assistance - Patient > 75%     Locomotion Ambulation   Ambulation assist      Assist level: Moderate Assistance - Patient 50 - 74% Assistive device: Hand held assist Max distance: 100'   Walk 10 feet activity   Assist     Assist level: Moderate Assistance - Patient - 50 - 74% Assistive device: Hand held assist   Walk 50 feet activity   Assist    Assist level: Moderate Assistance - Patient - 50 - 74% Assistive device: Hand held assist  Walk 150 feet activity   Assist Walk 150 feet activity did not occur: Safety/medical concerns         Walk 10 feet on uneven surface  activity   Assist     Assist level: Moderate Assistance - Patient - 50 - 74% Assistive device: Hand held assist   Wheelchair     Assist Will patient use wheelchair at discharge?: No             Wheelchair 50 feet with 2 turns activity    Assist            Wheelchair 150 feet activity     Assist          Blood pressure (!) 136/48, pulse (!) 44, temperature 97.8 F (36.6 C), resp. rate 17, height '5\' 8"'$  (1.727 m), weight 112.2 kg, SpO2 95 %.    Medical Problem List and Plan: 1.  Ischemic left MCA stroke             -patient may shower  2/27- got IVs out- LUE doing better             -ELOS/Goals: S 8-10 days  -Initial CIR evaluations today.  2.  Antithrombotics: -DVT/anticoagulation:   Pharmaceutical: Other (comment)--Continue Eliquis             -antiplatelet therapy: N/A 3. Pain Management: Tylenol prn. 4. Mood: LCSW to follow for evaluation and support.              -antipsychotic agents: N/A 5. Neuropsych: This patient is not  capable of making decisions on her own behalf. 6. Skin/Wound Care: Routine pressure relief measures.  7. Fluids/Electrolytes/Nutrition: Strict I/O. Monitor weights daily              --Renal/CM diet with 1200 cc/FR 8. ESRD: Schedule HD at the end of the day to help with tolerance of therapy.             -- TTS during hospitalization.              --hyponatremia stable and hyperkalemia resolved (post lokelma/HD).   2/26- Dialysis T/H/S- went this afternoon  2/27- labs renal fxn panel and CBC qmonday and per renal 9. A flutter/Bradycardia: Has failed attempts at R-PPM and to follow up with primary cardiology for leadless pacemaker             --has been bradycardic, continue to monitor HR tid (Norvasc back on board)  2/27- HR sounded regular, but HR is in 40s- will need pacemaker with primary cards? 10. HTN: Monitor BP tid --Amlodipine and Cozaar resumed on 02/23. Systolic BP elevated and diastolic soft, continue as per nephrology recommendations  2/26- Bradycardic to 47 today- BP well controlled- con't regimen  2/27- rate to low-mid 40s- BP controlled- con't regimen              --monitor for recurrent bradycardia. 11. T2DM: Hgb A1C- 8.5. Was on Lantus 80 units/hs PTA.              --Monitor BS ac/hs and titrate lantus for better control.              CBGS 175-236: increase Lantus to 16U  2/26- BGs 150-235- won't make changes today since just done yesterday- will monitor and con't SSI as well as scheduled insulin  2/27- BGs 150-261- will increase Lantus to 18 units BID since has 1 at 150- don't want it to drop too low.   12. OSA:  Resume CPAP--question compliance. 13. Obesity (BMI 39.15): provide dietary counseling  LOS: 3 days A FACE TO FACE  EVALUATION WAS PERFORMED  Rachael Herrera 07/21/2020, 3:21 PM

## 2020-07-21 NOTE — Progress Notes (Signed)
Physical Therapy Session Note  Patient Details  Name: Rachael Herrera MRN: [id number] Date of Birth: 1961-02-19  Today's Date: 07/21/2020 PT Individual Time: 0901-0959 PT Individual Time Calculation (min): 58 min   Pt refused PT x 60' in PM.  Short Term Goals: Week 1:  PT Short Term Goal 1 (Week 1): Pt will perform bed mobility with CGA. PT Short Term Goal 2 (Week 1): Pt will perform bed to chair wtih CGA. PT Short Term Goal 3 (Week 1): Pt will ambulate 150' with minA. PT Short Term Goal 4 (Week 1): PT will perform 8 steps with BHRs and minA.  Skilled Therapeutic Interventions/Progress Updates:  First session:   Pt presents supine in bed but appears to agree to therapy.  Pt able to roll side to side w/ CGA to don brief and then assists w/ threading LEs through scrub pants.  Pt transferred sup to sit w/ min A to CGA and then required mod A to thread pants over LEs and then stood w/ min A to pull pants over hips.  Pt transferred to w/c w/ min A, step-pivot and HHA.  Pt then wheeled to gym for time conservation.  Pt performed standing reaching to left and hanging horseshoes on hoop to right at increasing height and removing.  Pt using BUEs simultaneously, crossing midline and reaching outside of BOS.  Pt then performed on Airex cushion w/ increased reluctance to remove LUE from support.  Pt attempted shoulder press w/ yellow T-ball on cushion, but increased instability and refused to continue.  Discussed need for increased challenges w/ balance, but unwilling at this time.  Pt performed standing on airex using mirror for feedback.  Pt returned to room and remained in w/c w/ chair alarm and all needs in reach.   Second session: Pt presents in bed and not opening eyes after seeing this PT enter room.  Pt shakes head "no" to participation in therapy.  Educated on need for therapy for return to home but still refuses to open eyes and participate w/ therapy.  Missed therapy time of 60 minutes.      Therapy Documentation Precautions:  Precautions Precautions: Fall Precaution Comments: R inattention Restrictions Weight Bearing Restrictions: No General:   Vital Signs:  Pain: appears to have no pain. Pain Assessment Pain Scale: 0-10 Pain Score: 0-No pain Mobility:      Therapy/Group: Individual Therapy  Ladoris Gene 07/21/2020, 10:00 AM

## 2020-07-21 NOTE — Progress Notes (Signed)
Occupational Therapy Session Note  Patient Details  Name: Rachael Herrera MRN: [id number] Date of Birth: 09/29/1960  Today's Date: 07/21/2020 OT Individual Time: 1446-1455 OT Individual Time Calculation (min): 9 min  51 minutes missed  Short Term Goals: Week 1:  OT Short Term Goal 1 (Week 1): Pt will maintain grip on toothbrish with R hand while placing toothpaste OT Short Term Goal 2 (Week 1): Pt will complete 2 steps of toileting task OT Short Term Goal 3 (Week 1): Pt will maintain standing balance within BADL task for 2 minutes  Skilled Therapeutic Interventions/Progress Updates:    Pt greeted in bed, appearing lethargic with eyes closed. Attempted to establish rapport and invite her to participate in any self care related or meaningful activity. Pt however turning head away from OT x2. Despite encouragement from OT, pt shaking head when asked if she wanted to participate in therapy. Notified RN of pts sleepiness + therapy refusal. Time missed due to pt refusal, pt turning her head away when asked if she had pain. Pt remained in bed with all needs within reach and bed alarm set.  Therapy Documentation Precautions:  Precautions Precautions: Fall Precaution Comments: R inattention Restrictions Weight Bearing Restrictions: No Vital Signs: Therapy Vitals Temp: 97.8 F (36.6 C) Pulse Rate: (!) 44 Resp: 17 BP: (!) 136/48 Patient Position (if appropriate): Lying Oxygen Therapy SpO2: 95 % O2 Device: Room Air ADL: ADL Grooming: Minimal assistance Upper Body Bathing: Minimal assistance,Moderate assistance Lower Body Bathing: Maximal assistance Upper Body Dressing: Moderate assistance Lower Body Dressing: Maximal assistance Toileting: Minimal assistance Toilet Transfer: Minimal assistance      Therapy/Group: Individual Therapy  Calle Schader A Emily Forse 07/21/2020, 3:24 PM

## 2020-07-21 NOTE — Progress Notes (Signed)
Nephrology Follow-Up Consult note     Interval History/Subjective:   last HD on 2/26 with 3 kg UF.   She does not provide additional hx due to aphasia.  She's a little more communicative today.   Review of systems: limited by aphasia . Able to state "no" once   Medications:  Current Facility-Administered Medications  Medication Dose Route Frequency Provider Last Rate Last Admin  . acetaminophen (TYLENOL) tablet 325-650 mg  325-650 mg Oral Q4H PRN Love, Pamela S, PA-C      . amitriptyline (ELAVIL) tablet 25 mg  25 mg Oral Jackelyn Poling, MD   25 mg at 07/20/20 2047  . amLODipine (NORVASC) tablet 10 mg  10 mg Oral Daily [name], MD   10 mg at 07/21/20 0758  . apixaban (ELIQUIS) tablet 5 mg  5 mg Oral BID Meredith Staggers, MD   5 mg at 07/21/20 0757  . atorvastatin (LIPITOR) tablet 80 mg  80 mg Oral Daily [name], MD   80 mg at 07/21/20 0758  . bisacodyl (DULCOLAX) suppository 10 mg  10 mg Rectal Daily PRN Bary Leriche, PA-C      . Chlorhexidine Gluconate Cloth 2 % PADS 6 each  6 each Topical Daily Love, Ivan Anchors, PA-C      . Chlorhexidine Gluconate Cloth 2 % PADS 6 each  6 each Topical Q0600 Claudia Desanctis, MD   6 each at 07/21/20 0535  . cinacalcet (SENSIPAR) tablet 60 mg  60 mg Oral Q breakfast [name], MD   60 mg at 07/21/20 0757  . diphenhydrAMINE (BENADRYL) 12.5 MG/5ML elixir 12.5-25 mg  12.5-25 mg Oral Q6H PRN Love, Pamela S, PA-C      . feeding supplement (NEPRO CARB STEADY) liquid 237 mL  237 mL Oral TID BM [name], MD   237 mL at 07/21/20 1041  . guaiFENesin-dextromethorphan (ROBITUSSIN DM) 100-10 MG/5ML syrup 5-10 mL  5-10 mL Oral Q6H PRN Love, Pamela S, PA-C      . insulin aspart (novoLOG) injection 0-5 Units  0-5 Units Subcutaneous QHS Raulkar, Clide Deutscher, MD   2 Units at 07/20/20 2119  . insulin aspart (novoLOG) injection 0-9 Units  0-9 Units Subcutaneous TID WC Raulkar, Clide Deutscher, MD   1 Units at 07/21/20 0757  . insulin glargine (LANTUS) injection 16 Units   16 Units Subcutaneous BID Izora Ribas, MD   16 Units at 07/21/20 0757  . levothyroxine (SYNTHROID) tablet 75 mcg  75 mcg Oral QAC breakfast [name], MD   75 mcg at 07/21/20 0533  . losartan (COZAAR) tablet 50 mg  50 mg Oral Daily [name], MD   50 mg at 07/21/20 0757  . MEDLINE mouth rinse  15 mL Mouth Rinse BID [name], MD   15 mL at 07/21/20 0801  . milk and molasses enema  1 enema Rectal Daily PRN Love, Pamela S, PA-C      . multivitamin (RENA-VIT) tablet 1 tablet  1 tablet Oral QHS [name], MD   1 tablet at 07/20/20 2047  . nystatin (MYCOSTATIN) 100000 UNIT/ML suspension 500,000 Units  5 mL Oral QID [name], MD   500,000 Units at 07/21/20 0758  . pneumococcal 23 valent vaccine (PNEUMOVAX-23) injection 0.5 mL  0.5 mL Intramuscular Tomorrow-1000 Alger Simons T, MD      . polyethylene glycol (MIRALAX / GLYCOLAX) packet 17 g  17 g Oral Daily PRN Meredith Staggers, MD      .  prochlorperazine (COMPAZINE) tablet 5-10 mg  5-10 mg Oral Q6H PRN Love, Pamela S, PA-C       Or  . prochlorperazine (COMPAZINE) injection 5-10 mg  5-10 mg Intramuscular Q6H PRN Love, Pamela S, PA-C       Or  . prochlorperazine (COMPAZINE) suppository 12.5 mg  12.5 mg Rectal Q6H PRN Love, Pamela S, PA-C      . senna-docusate (Senokot-S) tablet 1 tablet  1 tablet Oral QHS PRN [name], MD      . sevelamer carbonate (RENVELA) tablet 800 mg  800 mg Oral TID WC [name], MD   800 mg at 07/21/20 0758  . traZODone (DESYREL) tablet 25-50 mg  25-50 mg Oral QHS PRN Flora Lipps        Physical Exam: Vitals:   07/20/20 2239 07/21/20 0536  BP:  (!) 152/74  Pulse: 61 (!) 54  Resp: 18 18  Temp:  98.4 F (36.9 C)  SpO2: 95% 96%   Total I/O In: 240 [P.O.:240] Out: -   Intake/Output Summary (Last 24 hours) at 07/21/2020 1241 Last data filed at 07/21/2020 [id number] Gross per 24 hour  Intake 360 ml  Output 3000 ml  Net -2640 ml   General adult female in bed in no acute distress  HEENT  normocephalic atraumatic Neck supple trachea midline Lungs clear to auscultation bilaterally normal work of breathing at rest  Heart S1S2 no rub Abdomen soft nontender nondistended; obese habitus Extremities no pitting edema  Psych no anxiety or agitation  Neuro aphasia/word finding difficulty - speaks very little/nods/shakes head or shrugs shoulders to answer; does say "no" once Access AVF LUE  Bruit and thrill     Test Results I personally reviewed new and old clinical labs and radiology tests Lab Results  Component Value Date   NA 137 07/21/2020   K 3.9 07/21/2020   CL 96 (L) 07/21/2020   CO2 25 07/21/2020   BUN 32 (H) 07/21/2020   CREATININE 8.14 (H) 07/21/2020   CALCIUM 9.0 07/21/2020   ALBUMIN 3.5 07/21/2020   PHOS 5.1 (H) 07/21/2020    Outpt Records: NEPHROLOGIST: Cay Schillings MD  LOCATION: [facility]  SCHEDULE: T-Th-S 1st Shift  EDW: 273 lbs? KIDNEY: [facility] F200N  LITERS PROC: 73.5 liters/treatment  HD TIME: 210  ACCESS: R IJ PC  NEEDLE SIZE:  ANTICOAG: Standard Heparin Per Protocol  BATH: 3.0CA-HCO3  QB: 350 ml/min  QD: 700 ml/min   Assessment/Recommendations: Rachael Herrera is a/an 60 y.o. female with a past medical history significant for ESRD, admitted for CVA after dialysis.     ESRD: - HD per TTS schedule while here  Hyperkalemia: resolved with Lokelma and HD  Hypertension/volume: optimize volume with HD.  She's quite a bit below reported EDW with slight dec turgor hands.  (was charted as EDW 273 kg? - perhaps 273 lbs which would be 123 kg).  Increase losartan to 100 mg daily  Anemia: hgb acceptable; also defer ESA setting of recent CVA  Secondary hyperparathyroidism: Would continue Sensipar 60 mg daily. On renvela  DM2: mgmt per primary  CVA: s/p thrombectomy. Mgmt per primary  Bradycardia - noted. Confirmed not on beta blocker.  Per primary team. Better today.  Claudia Desanctis, MD 07/21/2020 12:56 PM

## 2020-07-21 NOTE — IPOC Note (Signed)
Overall Plan of Care [facility]) Patient Details Name: Rachael Herrera MRN: [id number] DOB: [birth year]  Admitting Diagnosis: Acute ischemic left MCA stroke [facility])  Hospital Problems: Principal Problem:   Acute ischemic left MCA stroke [facility])     Functional Problem List: Nursing Behavior,Bladder,Bowel,Endurance,Medication Management,Pain,Nutrition,Perception,Safety,Skin Integrity  PT Balance,Behavior,Endurance,Motor,Pain,Perception,Safety  OT Balance,Behavior,Cognition,Endurance,Motor,Perception,Safety,Vision  SLP Cognition  TR         Basic ADL's: OT Bathing,Eating,Grooming,Dressing,Toileting     Advanced  ADL's: OT       Transfers: PT Bed Mobility,Bed to Chair,Car,Furniture  OT Toilet,Tub/Shower     Locomotion: PT Ambulation     Additional Impairments: OT Fuctional Use of Upper Extremity  SLP Swallowing,Communication,Social Cognition expression,comprehension Awareness,Problem Solving,Attention  TR      Anticipated Outcomes Item Anticipated Outcome  Self Feeding set-up A  Swallowing  Min A   Basic self-care  Supervision/mod I  Social worker  Mod-Min A  Cognition  Mod A  Pain  <3  Safety/Judgment  Supervision   Therapy Plan: PT Intensity: Minimum of 1-2 x/day ,45 to 90 minutes PT Frequency: 5 out of 7 days PT Duration Estimated Length of Stay: 2-2.5 weeks OT Intensity: Minimum of 1-2 x/day, 45 to 90 minutes OT Frequency: 5 out of 7 days OT Duration/Estimated Length of Stay: 14-18 days SLP Intensity: Minumum of 1-2 x/day, 30 to 90 minutes SLP Frequency: 3 to 5 out of 7 days SLP Duration/Estimated Length of Stay: 2 - 2.5 weeks   Due to the current state of emergency, patients may not be receiving their 3-hours of Medicare-mandated therapy.   Team Interventions: Nursing Interventions Patient/Family Education,Bladder  Management,Bowel Management,Disease Management/Prevention,Pain Management,Medication Management,Skin Care/Wound Management,Discharge Planning,Psychosocial Support  PT interventions Ambulation/gait training,Community Corporate treasurer re-education,Psychosocial support,Stair training,UE/LE Strength taining/ROM,UE/LE Coordination activities,Therapeutic Activities,Skin care/wound management,Pain management,Discharge planning,Balance/vestibular training,Cognitive remediation/compensation,Disease management/prevention,Functional mobility training,Patient/family education,Therapeutic Exercise,Visual/perceptual remediation/compensation,Functional electrical stimulation  OT Interventions Balance/vestibular training,Cognitive remediation/compensation,Community reintegration,Discharge planning,Disease Public affairs consultant stimulation,Functional mobility training,Neuromuscular re-education,Patient/family education,Psychosocial support,Self Care/advanced ADL retraining,Pain management,Splinting/orthotics,Therapeutic Activities,Therapeutic Exercise,UE/LE Strength taining/ROM,UE/LE Coordination activities,Skin care/wound managment,Wheelchair propulsion/positioning,Visual/perceptual remediation/compensation  SLP Interventions Cognitive remediation/compensation,Cueing hierarchy,Functional tasks,Environmental controls,Dysphagia/aspiration precaution training,Internal/external aids,Speech/Language facilitation,Patient/family education  TR Interventions    SW/CM Interventions Discharge Planning,Psychosocial Support,Patient/Family Education   Barriers to Discharge MD  Medical stability, Home enviroment access/loayout, Incontinence, Lack of/limited family support, Weight, Hemodialysis, Weight bearing restrictions, Medication compliance and aphasia  Nursing Inaccessible home environment,Decreased caregiver support,Home  environment access/layout,Incontinence,Lack of/limited family support,Weight,Medication compliance,Behavior,Nutrition means    PT Home environment access/layout,Hemodialysis    OT      SLP      SW Decreased caregiver support,Lack of/limited family support,Hemodialysis     Team Discharge Planning: Destination: PT-Home ,OT- Home , SLP-Home Projected Follow-up: PT-24 hour supervision/assistance,Outpatient PT, OT-  Home health OT, SLP-24 hour supervision/assistance,Home Health SLP,Outpatient SLP Projected Equipment Needs: PT-To be determined, OT- To be determined, SLP-None recommended by SLP Equipment Details: PT- , OT-  Patient/family involved in discharge planning: PT- Patient,  OT-Patient unable/family or caregiver not available, SLP-Patient  MD ELOS: 2-2.5 weeks Medical Rehab Prognosis:  Good Assessment:  Pt is a 60 yr old female with hx of ESRD on HD, bradycardia/aflutter, DM, and new L MCA stroke with severe aphasia And BMI of 39 and dysphagia Goals- min Assist to supervision   See Team Conference Notes for weekly updates to the plan of care

## 2020-07-22 DIAGNOSIS — R001 Bradycardia, unspecified: Secondary | ICD-10-CM

## 2020-07-22 DIAGNOSIS — E669 Obesity, unspecified: Secondary | ICD-10-CM

## 2020-07-22 DIAGNOSIS — N186 End stage renal disease: Secondary | ICD-10-CM

## 2020-07-22 DIAGNOSIS — E1169 Type 2 diabetes mellitus with other specified complication: Secondary | ICD-10-CM

## 2020-07-22 DIAGNOSIS — Z992 Dependence on renal dialysis: Secondary | ICD-10-CM

## 2020-07-22 DIAGNOSIS — I10 Essential (primary) hypertension: Secondary | ICD-10-CM

## 2020-07-22 LAB — RENAL FUNCTION PANEL
Albumin: 3.6 g/dL (ref 3.5–5.0)
Anion gap: 17 — ABNORMAL HIGH (ref 5–15)
BUN: 44 mg/dL — ABNORMAL HIGH (ref 8–23)
CO2: 25 mmol/L (ref 22–32)
Calcium: 9.2 mg/dL (ref 8.9–10.3)
Chloride: 93 mmol/L — ABNORMAL LOW (ref 98–111)
Creatinine, Ser: 9.88 mg/dL — ABNORMAL HIGH (ref 0.44–1.00)
GFR, Estimated: 4 mL/min — ABNORMAL LOW (ref >60)
Glucose, Bld: 112 mg/dL — ABNORMAL HIGH (ref 70–99)
Phosphorus: 5.2 mg/dL — ABNORMAL HIGH (ref 2.5–4.6)
Potassium: 4 mmol/L (ref 3.5–5.1)
Sodium: 135 mmol/L (ref 135–145)

## 2020-07-22 LAB — CBC
HCT: 38.6 % (ref 36.0–46.0)
Hemoglobin: 12.8 g/dL (ref 12.0–15.0)
MCH: 31.3 pg (ref 26.0–34.0)
MCHC: 33.2 g/dL (ref 30.0–36.0)
MCV: 94.4 fL (ref 80.0–100.0)
Platelets: 241 10*3/uL (ref 150–400)
RBC: 4.09 MIL/uL (ref 3.87–5.11)
RDW: 14.7 % (ref 11.5–15.5)
WBC: 7.8 10*3/uL (ref 4.0–10.5)
nRBC: 0 % (ref 0.0–0.2)

## 2020-07-22 LAB — GLUCOSE, CAPILLARY
Glucose-Capillary: 148 mg/dL — ABNORMAL HIGH (ref 70–99)
Glucose-Capillary: 161 mg/dL — ABNORMAL HIGH (ref 70–99)
Glucose-Capillary: 212 mg/dL — ABNORMAL HIGH (ref 70–99)
Glucose-Capillary: 229 mg/dL — ABNORMAL HIGH (ref 70–99)

## 2020-07-22 NOTE — Progress Notes (Signed)
PROGRESS NOTE   Subjective/Complaints:  Pt taking a nap. Awoke easily. Denied any pain or problems last night  ROS: limited due to language/communication    Objective:   No results found. Recent Labs    07/22/20 0453  WBC 7.8  HGB 12.8  HCT 38.6  PLT 241   Recent Labs    07/21/20 0528 07/22/20 0453  NA 137 135  K 3.9 4.0  CL 96* 93*  CO2 25 25  GLUCOSE 144* 112*  BUN 32* 44*  CREATININE 8.14* 9.88*  CALCIUM 9.0 9.2    Intake/Output Summary (Last 24 hours) at 07/22/2020 1138 Last data filed at 07/22/2020 0730 Gross per 24 hour  Intake 320 ml  Output --  Net 320 ml        Physical Exam: Vital Signs Blood pressure 134/63, pulse (!) 54, temperature 97.6 F (36.4 C), resp. rate 18, height '5\' 8"'$  (1.727 m), weight 112.2 kg, SpO2 99 %.   Constitutional: No distress . Vital signs reviewed.morbidly obese HEENT: EOMI, oral membranes moist Neck: supple Cardiovascular: RRR without murmur. No JVD    Respiratory/Chest: CTA Bilaterally without wheezes or rales. Normal effort    GI/Abdomen: BS +, non-tender, non-distended Ext: no clubbing, cyanosis, or edema Psych: flat Neurological:     Mental Status: She is alert. And awake, mumbled non-intelligible words. Seemed demonstrate comprehension, nods Y/N. RUE/RLE 2-3/5. LUE/LLE 3-4/5. Sensed pain in all 4's. RIght central 7       Assessment/Plan: 1. Functional deficits which require 3+ hours per day of interdisciplinary therapy in a comprehensive inpatient rehab setting.  Physiatrist is providing close team supervision and 24 hour management of active medical problems listed below.  Physiatrist and rehab team continue to assess barriers to discharge/monitor patient progress toward functional and medical goals  Care Tool:  Bathing    Body parts bathed by patient: Right arm,Chest,Left arm,Front perineal area,Abdomen,Face,Right upper leg,Left upper leg   Body  parts bathed by helper: Right lower leg,Left lower leg,Buttocks     Bathing assist Assist Level: Moderate Assistance - Patient 50 - 74%     Upper Body Dressing/Undressing Upper body dressing   What is the patient wearing?: Pull over shirt    Upper body assist Assist Level: Moderate Assistance - Patient 50 - 74%    Lower Body Dressing/Undressing Lower body dressing      What is the patient wearing?: Underwear/pull up,Pants     Lower body assist Assist for lower body dressing: Maximal Assistance - Patient 25 - 49%     Toileting Toileting    Toileting assist Assist for toileting: Minimal Assistance - Patient > 75%     Transfers Chair/bed transfer  Transfers assist     Chair/bed transfer assist level: Minimal Assistance - Patient > 75%     Locomotion Ambulation   Ambulation assist      Assist level: Moderate Assistance - Patient 50 - 74% Assistive device: Hand held assist Max distance: 100'   Walk 10 feet activity   Assist     Assist level: Moderate Assistance - Patient - 50 - 74% Assistive device: Hand held assist   Walk 50 feet activity   Assist  Assist level: Moderate Assistance - Patient - 50 - 74% Assistive device: Hand held assist    Walk 150 feet activity   Assist Walk 150 feet activity did not occur: Safety/medical concerns         Walk 10 feet on uneven surface  activity   Assist     Assist level: Moderate Assistance - Patient - 50 - 74% Assistive device: Hand held assist   Wheelchair     Assist Will patient use wheelchair at discharge?: No             Wheelchair 50 feet with 2 turns activity    Assist            Wheelchair 150 feet activity     Assist          Blood pressure 134/63, pulse (!) 54, temperature 97.6 F (36.4 C), resp. rate 18, height '5\' 8"'$  (1.727 m), weight 112.2 kg, SpO2 99 %.    Medical Problem List and Plan: 1.  Ischemic left MCA stroke             -patient may  shower   -ELOS/Goals: S 8-10 days--team conference tomorrow 2.  Antithrombotics: -DVT/anticoagulation:  Pharmaceutical: Other (comment)--Continue Eliquis             -antiplatelet therapy: N/A 3. Pain Management: Tylenol prn. 4. Mood: LCSW to follow for evaluation and support.              -antipsychotic agents: N/A 5. Neuropsych: This patient is not  capable of making decisions on her own behalf. 6. Skin/Wound Care: Routine pressure relief measures.  7. Fluids/Electrolytes/Nutrition: Strict I/O. Monitor weights daily              --Renal/CM diet with 1200 cc/FR  -I personally reviewed the patient's labs today.   8. ESRD: Schedule HD at the end of the day to help with tolerance of therapy.             -- TTS during hospitalization.              --hyponatremia stable and hyperkalemia resolved (post lokelma/HD).   2/26- Dialysis T/H/S 9. A flutter/Bradycardia: Has failed attempts at R-PPM and to follow up with primary cardiology for leadless pacemaker             -HR in 50's today. Pacemaker per cards? 10. HTN: Monitor BP tid --Amlodipine and Cozaar resumed on 02/23. Systolic BP elevated and diastolic soft, continue as per nephrology recommendations  -2/28 bp controlled 11. T2DM: Hgb A1C- 8.5. Was on Lantus 80 units/hs PTA.              --Monitor BS ac/hs and titrate lantus for better control.              2/28  Lantus increased to 18 units BID yesterday.  Observe for response 12. OSA: Resume CPAP--question compliance. 13. Obesity (BMI 39.15): provide dietary counseling  LOS: 4 days A FACE TO FACE EVALUATION WAS PERFORMED  Meredith Staggers 07/22/2020, 11:38 AM

## 2020-07-22 NOTE — Progress Notes (Signed)
Pt refused cpap for the night.  

## 2020-07-22 NOTE — Progress Notes (Signed)
Physical Therapy Session Note  Patient Details  Name: Rachael Herrera MRN: [id number] Date of Birth: 1961-01-28  Today's Date: 07/22/2020 PT Individual Time: [id number] PT Individual Time Calculation (min): 56 min   Short Term Goals: Week 1:  PT Short Term Goal 1 (Week 1): Pt will perform bed mobility with CGA. PT Short Term Goal 2 (Week 1): Pt will perform bed to chair wtih CGA. PT Short Term Goal 3 (Week 1): Pt will ambulate 150' with minA. PT Short Term Goal 4 (Week 1): PT will perform 8 steps with BHRs and minA.  Skilled Therapeutic Interventions/Progress Updates:     Pt received supine in bed. Initially pt is resistant to therapy, shaking head no despite multiple PT attempts to initiate session. Pt indicates that she is tired and wants to rest. After persistent encouragement from PT and education on benefits of therapy, pt is agreeable to participate. No report of pain. Supine to sit with minA. Pt performs stand step transfer with minA and decreased safety due to not attending to PT's cues and performing transfer with her own preferred technique. WC transport to gym for time management. Pt transfer to Nustep with minA. Pt completes Nustep activity for strength and endurance training as well as reciprocal coordination. PT cues for pt's hand and foot placement for optimal body mechanics. Pt completes at workload of 3 for total of 10:00 with multiple rest breaks and requiring encouragement to continue activity. Following, pt attempts abulation without AD. Pt does not shift weight effectively into R foot and has very short stance time and stride length. PT provides modA and pt returns to [facility] after 10' ambulation. Pt then trials RW and ambulates x90' with modA due to unsafe RW management, pushing RW far out ahead of body and resisting PT when PT attempts to reposition RW.   Pt performs NMR in parallel bars with mirror positioned for visual feedback. Pt performs sit to stand with CGA and sidestepping  facing mirror, with minA and tactile cues for posture and body mechanics to focus strengthening on hip abductor muscles. Pt completes 2x20' sidestepping to L and R with seated rest break.  Stand step transfer back to bed with minA and sit to supine with supervision. Pt left with alarm intact and all needs within reach.  Therapy Documentation Precautions:  Precautions Precautions: Fall Precaution Comments: R inattention Restrictions Weight Bearing Restrictions: No   Therapy/Group: Individual Therapy  Breck Coons, PT, DPT 07/22/2020, 3:52 PM

## 2020-07-22 NOTE — Progress Notes (Signed)
Nephrology Follow-Up Consult note     Interval History/Subjective:  Does not provide any history.  No significant changes in the past 24 hours.  Review of systems: limited by aphasia . Able to state "no" once   Medications:  Current Facility-Administered Medications  Medication Dose Route Frequency Provider Last Rate Last Admin  . acetaminophen (TYLENOL) tablet 325-650 mg  325-650 mg Oral Q4H PRN Love, Pamela S, PA-C      . amitriptyline (ELAVIL) tablet 25 mg  25 mg Oral Jackelyn Poling, MD   25 mg at 07/21/20 2054  . amLODipine (NORVASC) tablet 10 mg  10 mg Oral Daily [name], MD   10 mg at 07/22/20 0850  . apixaban (ELIQUIS) tablet 5 mg  5 mg Oral BID Meredith Staggers, MD   5 mg at 07/22/20 0850  . atorvastatin (LIPITOR) tablet 80 mg  80 mg Oral Daily [name], MD   80 mg at 07/22/20 0850  . bisacodyl (DULCOLAX) suppository 10 mg  10 mg Rectal Daily PRN Bary Leriche, PA-C      . Chlorhexidine Gluconate Cloth 2 % PADS 6 each  6 each Topical Daily Bary Leriche, PA-C   6 each at 07/22/20 [phone]  . Chlorhexidine Gluconate Cloth 2 % PADS 6 each  6 each Topical Q0600 Claudia Desanctis, MD   6 each at 07/22/20 [phone]  . cinacalcet (SENSIPAR) tablet 60 mg  60 mg Oral Q breakfast [name], MD   60 mg at 07/22/20 0850  . diphenhydrAMINE (BENADRYL) 12.5 MG/5ML elixir 12.5-25 mg  12.5-25 mg Oral Q6H PRN Love, Pamela S, PA-C      . feeding supplement (NEPRO CARB STEADY) liquid 237 mL  237 mL Oral TID BM [name], MD   237 mL at 07/22/20 1030  . guaiFENesin-dextromethorphan (ROBITUSSIN DM) 100-10 MG/5ML syrup 5-10 mL  5-10 mL Oral Q6H PRN Love, Pamela S, PA-C      . insulin aspart (novoLOG) injection 0-5 Units  0-5 Units Subcutaneous QHS Raulkar, Clide Deutscher, MD   3 Units at 07/21/20 2226  . insulin aspart (novoLOG) injection 0-9 Units  0-9 Units Subcutaneous TID WC Raulkar, Clide Deutscher, MD   1 Units at 07/22/20 0849  . insulin glargine (LANTUS) injection 18 Units  18 Units Subcutaneous BID  Courtney Heys, MD   18 Units at 07/22/20 0850  . levothyroxine (SYNTHROID) tablet 75 mcg  75 mcg Oral QAC breakfast [name], MD   75 mcg at 07/22/20 0510  . losartan (COZAAR) tablet 100 mg  100 mg Oral Daily Claudia Desanctis, MD   100 mg at 07/22/20 0850  . MEDLINE mouth rinse  15 mL Mouth Rinse BID [name], MD   15 mL at 07/22/20 0857  . milk and molasses enema  1 enema Rectal Daily PRN Love, Pamela S, PA-C      . multivitamin (RENA-VIT) tablet 1 tablet  1 tablet Oral QHS [name], MD   1 tablet at 07/21/20 2054  . nystatin (MYCOSTATIN) 100000 UNIT/ML suspension 500,000 Units  5 mL Oral QID [name], MD   500,000 Units at 07/22/20 0850  . polyethylene glycol (MIRALAX / GLYCOLAX) packet 17 g  17 g Oral Daily PRN Meredith Staggers, MD      . prochlorperazine (COMPAZINE) tablet 5-10 mg  5-10 mg Oral Q6H PRN Love, Pamela S, PA-C       Or  . prochlorperazine (COMPAZINE) injection 5-10 mg  5-10 mg Intramuscular Q6H  PRN Bary Leriche, PA-C       Or  . prochlorperazine (COMPAZINE) suppository 12.5 mg  12.5 mg Rectal Q6H PRN Love, Pamela S, PA-C      . senna-docusate (Senokot-S) tablet 1 tablet  1 tablet Oral QHS PRN [name], MD      . sevelamer carbonate (RENVELA) tablet 800 mg  800 mg Oral TID WC [name], MD   800 mg at 07/22/20 0850  . traZODone (DESYREL) tablet 25-50 mg  25-50 mg Oral QHS PRN Flora Lipps        Physical Exam: Vitals:   07/21/20 1935 07/22/20 0510  BP: (!) 145/52 134/63  Pulse: (!) 52 (!) 54  Resp: 18 18  Temp: 97.9 F (36.6 C) 97.6 F (36.4 C)  SpO2: 99% 99%   Total I/O In: 100 [P.O.:100] Out: -   Intake/Output Summary (Last 24 hours) at 07/22/2020 1202 Last data filed at 07/22/2020 0730 Gross per 24 hour  Intake 320 ml  Output -  Net 320 ml   General adult female in bed in no acute distress  HEENT normocephalic atraumatic Neck supple trachea midline Lungs bilateral chest rise with no increased work of breathing Heart normal rate, no  rub Abdomen soft nontender nondistended; obese habitus Extremities no pitting edema  Psych no anxiety or agitation  Neuro aphasia/word finding difficulty  Access AVF LUE  Bruit and thrill     Test Results I personally reviewed new and old clinical labs and radiology tests Lab Results  Component Value Date   NA 135 07/22/2020   K 4.0 07/22/2020   CL 93 (L) 07/22/2020   CO2 25 07/22/2020   BUN 44 (H) 07/22/2020   CREATININE 9.88 (H) 07/22/2020   CALCIUM 9.2 07/22/2020   ALBUMIN 3.6 07/22/2020   PHOS 5.2 (H) 07/22/2020    Outpt Records: NEPHROLOGIST: Cay Schillings MD  LOCATION: [facility]  SCHEDULE: T-Th-S 1st Shift  EDW: 273 lbs? KIDNEY: [facility] F200N  LITERS PROC: 73.5 liters/treatment  HD TIME: 210  ACCESS: R IJ PC  NEEDLE SIZE:  ANTICOAG: Standard Heparin Per Protocol  BATH: 3.0CA-HCO3  QB: 350 ml/min  QD: 700 ml/min   Assessment/Recommendations: Rachael Herrera is a/an 60 y.o. female with a past medical history significant for ESRD, admitted for CVA after dialysis.     ESRD: - HD per TTS schedule while here  Hypertension/volume: optimize volume with HD.  She's quite a bit below reported EDW. Adjust UF as needed. Blood pressure acceptable on current medications  Anemia: hgb acceptable; also defer ESA setting of recent CVA  Secondary hyperparathyroidism: Would continue Sensipar 60 mg daily. On renvela  DM2: mgmt per primary  CVA: s/p thrombectomy. Mgmt per primary  Bradycardia - persistent.  Continue to monitor.  Not on beta-blocker  Reesa Chew, MD 07/22/2020 12:02 PM

## 2020-07-22 NOTE — Care Management (Signed)
Inpatient Rehabilitation Center Individual Statement of Services  Patient Name:  Rachael Herrera  Date:  07/22/2020  Welcome to the [facility].  Our goal is to provide you with an individualized program based on your diagnosis and situation, designed to meet your specific needs.  With this comprehensive rehabilitation program, you will be expected to participate in at least 3 hours of rehabilitation therapies Monday-Friday, with modified therapy programming on the weekends.  Your rehabilitation program will include the following services:  Physical Therapy (PT), Occupational Therapy (OT), Speech Therapy (ST), 24 hour per day rehabilitation nursing, Therapeutic Recreaction (TR), Psychology, Neuropsychology, Care Coordinator, Rehabilitation Medicine, Nutrition Services, Pharmacy Services and Other  Weekly team conferences will be held on Tuesdays to discuss your progress.  Your Inpatient Rehabilitation Care Coordinator will talk with you frequently to get your input and to update you on team discussions.  Team conferences with you and your family in attendance may also be held.  Expected length of stay:    Overall anticipated outcome: 2-2.5 weeks  Depending on your progress and recovery, your program may change. Your Inpatient Rehabilitation Care Coordinator will coordinate services and will keep you informed of any changes. Your Inpatient Rehabilitation Care Coordinator's name and contact numbers are listed  below.  The following services may also be recommended but are not provided by the [place] will be made to provide these services after discharge if needed.  Arrangements include referral to agencies that provide these services.  Your insurance has been verified to be:  [facility] Medicare  Your primary doctor  is:  No PCP listed  Pertinent information will be shared with your doctor and your insurance company.  Inpatient Rehabilitation Care Coordinator:  Cathleen Corti [phone] or [phone]  Information discussed with and copy given to patient by: Rana Snare, 07/22/2020, 10:54 AM

## 2020-07-22 NOTE — Progress Notes (Signed)
Physical Therapy Note  Patient Details  Name: Rachael Herrera MRN: [id number] Date of Birth: [birth year] Today's Date: 07/22/2020    PT attempting to see patient for make-up therapy time. However, patient not agreeable to participating in therapy at this time. She remains up in wc, alarm on, call light within reach.    Debbora Dus 07/22/2020, 9:50 AM

## 2020-07-22 NOTE — Progress Notes (Signed)
Occupational Therapy Session Note  Patient Details  Name: Rachael Herrera MRN: [id number] Date of Birth: 03/18/1961  Today's Date: 07/22/2020 OT Individual Time: 0834-0930 OT Individual Time Calculation (min): 56 min    Short Term Goals: Week 1:  OT Short Term Goal 1 (Week 1): Pt will maintain grip on toothbrish with R hand while placing toothpaste OT Short Term Goal 2 (Week 1): Pt will complete 2 steps of toileting task OT Short Term Goal 3 (Week 1): Pt will maintain standing balance within BADL task for 2 minutes  Skilled Therapeutic Interventions/Progress Updates:    Pt greeted semi-reclined in bed, initially shaking head no to participating in BADL tasks, but agreeable with encouragement. Pt came to sitting EOB with supervision. She donned socks at EOB with min A to thread L sock. Pt then ambulated to wc at the sink with CGA and 1 posterior LOB when turning to sit. Bathing completed using B UEs and set-up A for UB. Min A for LB bathing/dressing from wc. Worked on standing balance/endurance with standing toothbrushing task. OT tried to get pt to stand upright at the sink but she leaned over onto her elbows despite max cues. Worked on R hand strength and fine motor control with soft Red theraputty. Pt with some difficulty understanding OT's directions requiring demonstration and mod cues. Pt agreeable to stay up in wc at end of session and pt left with chair alarm belt on, call bell in reach, and needs met.   Therapy Documentation Precautions:  Precautions Precautions: Fall Precaution Comments: R inattention Restrictions Weight Bearing Restrictions: No Pain: Denies pain  Therapy/Group: Individual Therapy  Elisabeth S Doe 07/22/2020, 9:04 AM 

## 2020-07-22 NOTE — Progress Notes (Signed)
Speech Language Pathology Daily Session Note  Patient Details  Name: Rachael Herrera MRN: [id number] Date of Birth: [birth year]  Today's Date: 07/22/2020 SLP Individual Time: 1100-1155 SLP Individual Time Calculation (min): 55 min  Short Term Goals: Week 1: SLP Short Term Goal 1 (Week 1): Pt improve verbal fluency with automatic language as well as using melodic intonation strategies for simple phrases with 40% accuracy with max A multimodal cues. SLP Short Term Goal 2 (Week 1): Pt respond to yes/no questions pertaining to biographical information/immediate environment  and wants/needs with 60% accuracy with max A multimodal cues. SLP Short Term Goal 3 (Week 1): Pt will follow 1 step commands with 50% accuracy with max A multimodal cues. SLP Short Term Goal 4 (Week 1): Pt will match word to object in a field of 2 with 90% accuracy with min A semantic cues. SLP Short Term Goal 5 (Week 1): Pt will clear right buccal pocketing with max A verbal cues of dys 2 textures. SLP Short Term Goal 6 (Week 1): Pt will demonstrate awareness of verbal and functional errors with max A multimodal cues in 10% of opportunities.  Skilled Therapeutic Interventions: Skilled treatment session focused on communication goals. SLP facilitated session by providing Max A multimodal cues for patient to vocalize phonemes /ah/ and /e/ in isolation. Patient able to repeat the phoneme accurately X 3. Patient noted to say spontaneous and automatic phrases throughout session and able to reproduce the words, "maybe" "no" and "doris" in isolation with Max A multimodal cues and did best with written cues. Patient with minimal awareness of errors and required Max A multimodal cues to self-monitor and correct. Patient appeared lethargic throughout session. Patient left upright in bed with alarm on and all needs within reach. Continue with current plan of care.      Pain Pain Assessment Pain Scale: 0-10 Pain Score: 0-No  pain  Therapy/Group: Individual Therapy  [place] 07/22/2020, 12:26 PM

## 2020-07-22 NOTE — Plan of Care (Signed)
  Problem: Consults Goal: RH STROKE PATIENT EDUCATION Description: See Patient Education module for education specifics  Outcome: Progressing Goal: Diabetes Guidelines if Diabetic/Glucose > 140 Description: If diabetic or lab glucose is > 140 mg/dl - Initiate Diabetes/Hyperglycemia Guidelines & Document Interventions  Outcome: Progressing   Problem: RH BLADDER ELIMINATION Goal: RH STG MANAGE BLADDER WITH ASSISTANCE Description: STG Manage Bladder With supervision Assistance Outcome: Progressing   Problem: RH SKIN INTEGRITY Goal: RH STG SKIN FREE OF INFECTION/BREAKDOWN Description: Skin to remain free from infection and breakdown with supervision assistance. Outcome: Progressing   Problem: RH SAFETY Goal: RH STG ADHERE TO SAFETY PRECAUTIONS W/ASSISTANCE/DEVICE Description: STG Adhere to Safety Precautions With supervision Assistance/Device. Outcome: Progressing   Problem: RH PAIN MANAGEMENT Goal: RH STG PAIN MANAGED AT OR BELOW PT'S PAIN GOAL Description: <3 on a 0-10 pain scale. Outcome: Progressing

## 2020-07-23 ENCOUNTER — Inpatient Hospital Stay (HOSPITAL_COMMUNITY): Payer: Medicare Other

## 2020-07-23 ENCOUNTER — Inpatient Hospital Stay (HOSPITAL_COMMUNITY)
Admission: AD | Admit: 2020-07-23 | Discharge: 2020-08-01 | DRG: 064 | Disposition: A | Discharge status: Skilled Nursing Facility | Payer: Medicare Other | Source: Intra-hospital | Attending: Internal Medicine | Admitting: Internal Medicine

## 2020-07-23 ENCOUNTER — Encounter (HOSPITAL_COMMUNITY): Payer: Self-pay | Admitting: Physical Medicine & Rehabilitation

## 2020-07-23 DIAGNOSIS — R001 Bradycardia, unspecified: Secondary | ICD-10-CM | POA: Diagnosis present

## 2020-07-23 DIAGNOSIS — E039 Hypothyroidism, unspecified: Secondary | ICD-10-CM | POA: Diagnosis present

## 2020-07-23 DIAGNOSIS — R401 Stupor: Secondary | ICD-10-CM | POA: Diagnosis not present

## 2020-07-23 DIAGNOSIS — Z8659 Personal history of other mental and behavioral disorders: Secondary | ICD-10-CM | POA: Diagnosis not present

## 2020-07-23 DIAGNOSIS — E1151 Type 2 diabetes mellitus with diabetic peripheral angiopathy without gangrene: Secondary | ICD-10-CM | POA: Diagnosis present

## 2020-07-23 DIAGNOSIS — N186 End stage renal disease: Secondary | ICD-10-CM

## 2020-07-23 DIAGNOSIS — Z20822 Contact with and (suspected) exposure to covid-19: Secondary | ICD-10-CM | POA: Diagnosis present

## 2020-07-23 DIAGNOSIS — Z992 Dependence on renal dialysis: Secondary | ICD-10-CM | POA: Diagnosis not present

## 2020-07-23 DIAGNOSIS — I120 Hypertensive chronic kidney disease with stage 5 chronic kidney disease or end stage renal disease: Secondary | ICD-10-CM | POA: Diagnosis present

## 2020-07-23 DIAGNOSIS — I6340 Cerebral infarction due to embolism of unspecified cerebral artery: Secondary | ICD-10-CM | POA: Diagnosis present

## 2020-07-23 DIAGNOSIS — I10 Essential (primary) hypertension: Secondary | ICD-10-CM | POA: Diagnosis not present

## 2020-07-23 DIAGNOSIS — I69320 Aphasia following cerebral infarction: Secondary | ICD-10-CM | POA: Diagnosis not present

## 2020-07-23 DIAGNOSIS — E785 Hyperlipidemia, unspecified: Secondary | ICD-10-CM | POA: Diagnosis present

## 2020-07-23 DIAGNOSIS — I63412 Cerebral infarction due to embolism of left middle cerebral artery: Principal | ICD-10-CM | POA: Diagnosis present

## 2020-07-23 DIAGNOSIS — I619 Nontraumatic intracerebral hemorrhage, unspecified: Secondary | ICD-10-CM | POA: Diagnosis not present

## 2020-07-23 DIAGNOSIS — Z833 Family history of diabetes mellitus: Secondary | ICD-10-CM

## 2020-07-23 DIAGNOSIS — E1142 Type 2 diabetes mellitus with diabetic polyneuropathy: Secondary | ICD-10-CM | POA: Diagnosis present

## 2020-07-23 DIAGNOSIS — E1122 Type 2 diabetes mellitus with diabetic chronic kidney disease: Secondary | ICD-10-CM | POA: Diagnosis present

## 2020-07-23 DIAGNOSIS — I495 Sick sinus syndrome: Secondary | ICD-10-CM | POA: Diagnosis present

## 2020-07-23 DIAGNOSIS — Z794 Long term (current) use of insulin: Secondary | ICD-10-CM | POA: Diagnosis not present

## 2020-07-23 DIAGNOSIS — I69351 Hemiplegia and hemiparesis following cerebral infarction affecting right dominant side: Secondary | ICD-10-CM

## 2020-07-23 DIAGNOSIS — G9340 Encephalopathy, unspecified: Secondary | ICD-10-CM | POA: Diagnosis present

## 2020-07-23 DIAGNOSIS — D631 Anemia in chronic kidney disease: Secondary | ICD-10-CM | POA: Diagnosis present

## 2020-07-23 DIAGNOSIS — I4892 Unspecified atrial flutter: Secondary | ICD-10-CM | POA: Diagnosis present

## 2020-07-23 DIAGNOSIS — I480 Paroxysmal atrial fibrillation: Secondary | ICD-10-CM | POA: Diagnosis present

## 2020-07-23 DIAGNOSIS — I611 Nontraumatic intracerebral hemorrhage in hemisphere, cortical: Secondary | ICD-10-CM | POA: Diagnosis present

## 2020-07-23 DIAGNOSIS — G4733 Obstructive sleep apnea (adult) (pediatric): Secondary | ICD-10-CM | POA: Diagnosis present

## 2020-07-23 DIAGNOSIS — I618 Other nontraumatic intracerebral hemorrhage: Secondary | ICD-10-CM | POA: Diagnosis present

## 2020-07-23 DIAGNOSIS — Z6838 Body mass index (BMI) 38.0-38.9, adult: Secondary | ICD-10-CM

## 2020-07-23 DIAGNOSIS — Z79890 Hormone replacement therapy (postmenopausal): Secondary | ICD-10-CM | POA: Diagnosis not present

## 2020-07-23 DIAGNOSIS — Z8249 Family history of ischemic heart disease and other diseases of the circulatory system: Secondary | ICD-10-CM

## 2020-07-23 DIAGNOSIS — N184 Chronic kidney disease, stage 4 (severe): Secondary | ICD-10-CM | POA: Diagnosis present

## 2020-07-23 DIAGNOSIS — Z79899 Other long term (current) drug therapy: Secondary | ICD-10-CM

## 2020-07-23 DIAGNOSIS — R4701 Aphasia: Secondary | ICD-10-CM | POA: Diagnosis not present

## 2020-07-23 DIAGNOSIS — Z23 Encounter for immunization: Secondary | ICD-10-CM | POA: Diagnosis present

## 2020-07-23 DIAGNOSIS — I639 Cerebral infarction, unspecified: Secondary | ICD-10-CM | POA: Diagnosis not present

## 2020-07-23 DIAGNOSIS — I63512 Cerebral infarction due to unspecified occlusion or stenosis of left middle cerebral artery: Secondary | ICD-10-CM | POA: Diagnosis not present

## 2020-07-23 DIAGNOSIS — R404 Transient alteration of awareness: Secondary | ICD-10-CM | POA: Diagnosis not present

## 2020-07-23 DIAGNOSIS — R75 Inconclusive laboratory evidence of human immunodeficiency virus [HIV]: Secondary | ICD-10-CM | POA: Diagnosis not present

## 2020-07-23 HISTORY — DX: Cerebral infarction, unspecified: I63.9

## 2020-07-23 LAB — RENAL FUNCTION PANEL
Albumin: 3.6 g/dL (ref 3.5–5.0)
Anion gap: 20 — ABNORMAL HIGH (ref 5–15)
BUN: 61 mg/dL — ABNORMAL HIGH (ref 8–23)
CO2: 20 mmol/L — ABNORMAL LOW (ref 22–32)
Calcium: 8.9 mg/dL (ref 8.9–10.3)
Chloride: 93 mmol/L — ABNORMAL LOW (ref 98–111)
Creatinine, Ser: 12.72 mg/dL — ABNORMAL HIGH (ref 0.44–1.00)
GFR, Estimated: 3 mL/min — ABNORMAL LOW (ref >60)
Glucose, Bld: 268 mg/dL — ABNORMAL HIGH (ref 70–99)
Phosphorus: 6.7 mg/dL — ABNORMAL HIGH (ref 2.5–4.6)
Potassium: 4.4 mmol/L (ref 3.5–5.1)
Sodium: 133 mmol/L — ABNORMAL LOW (ref 135–145)

## 2020-07-23 LAB — BLOOD GAS, ARTERIAL
Acid-Base Excess: 2.4 mmol/L — ABNORMAL HIGH (ref 0.0–2.0)
Bicarbonate: 26.3 mmol/L (ref 20.0–28.0)
Drawn by: 42783
FIO2: 24
O2 Saturation: 96 %
Patient temperature: 37
pCO2 arterial: 39.5 mmHg (ref 32.0–48.0)
pH, Arterial: 7.439 (ref 7.350–7.450)
pO2, Arterial: 88.1 mmHg (ref 83.0–108.0)

## 2020-07-23 LAB — CBC
HCT: 37.1 % (ref 36.0–46.0)
Hemoglobin: 11.7 g/dL — ABNORMAL LOW (ref 12.0–15.0)
MCH: 30.2 pg (ref 26.0–34.0)
MCHC: 31.5 g/dL (ref 30.0–36.0)
MCV: 95.6 fL (ref 80.0–100.0)
Platelets: 241 10*3/uL (ref 150–400)
RBC: 3.88 MIL/uL (ref 3.87–5.11)
RDW: 14.8 % (ref 11.5–15.5)
WBC: 8.2 10*3/uL (ref 4.0–10.5)
nRBC: 0 % (ref 0.0–0.2)

## 2020-07-23 LAB — GLUCOSE, CAPILLARY
Glucose-Capillary: 111 mg/dL — ABNORMAL HIGH (ref 70–99)
Glucose-Capillary: 245 mg/dL — ABNORMAL HIGH (ref 70–99)
Glucose-Capillary: 107 mg/dL — ABNORMAL HIGH (ref 70–99)
Glucose-Capillary: 122 mg/dL — ABNORMAL HIGH (ref 70–99)
Glucose-Capillary: 107 mg/dL — ABNORMAL HIGH (ref 70–99)

## 2020-07-23 LAB — AMMONIA: Ammonia: <9 umol/L — ABNORMAL LOW (ref 9–35)

## 2020-07-23 LAB — BASIC METABOLIC PANEL
Anion gap: 16 — ABNORMAL HIGH (ref 5–15)
BUN: 31 mg/dL — ABNORMAL HIGH (ref 8–23)
CO2: 26 mmol/L (ref 22–32)
Calcium: 9 mg/dL (ref 8.9–10.3)
Chloride: 93 mmol/L — ABNORMAL LOW (ref 98–111)
Creatinine, Ser: 8.25 mg/dL — ABNORMAL HIGH (ref 0.44–1.00)
GFR, Estimated: 5 mL/min — ABNORMAL LOW (ref >60)
Glucose, Bld: 136 mg/dL — ABNORMAL HIGH (ref 70–99)
Potassium: 4.2 mmol/L (ref 3.5–5.1)
Sodium: 135 mmol/L (ref 135–145)

## 2020-07-23 LAB — TROPONIN I (HIGH SENSITIVITY)
Troponin I (High Sensitivity): 21 ng/L — ABNORMAL HIGH (ref <18)
Troponin I (High Sensitivity): 22 ng/L — ABNORMAL HIGH (ref <18)

## 2020-07-23 LAB — MAGNESIUM: Magnesium: 2.2 mg/dL (ref 1.7–2.4)

## 2020-07-23 LAB — CORTISOL: Cortisol, Plasma: 12.2 ug/dL

## 2020-07-23 LAB — PHOSPHORUS: Phosphorus: 4.6 mg/dL (ref 2.5–4.6)

## 2020-07-23 LAB — TSH: TSH: 13.148 u[IU]/mL — ABNORMAL HIGH (ref 0.350–4.500)

## 2020-07-23 LAB — LACTIC ACID, PLASMA: Lactic Acid, Venous: 1.9 mmol/L (ref 0.5–1.9)

## 2020-07-23 MED ORDER — VITAMIN D 25 MCG (1000 UNIT) PO TABS
1000.0000 [IU] | ORAL_TABLET | Freq: Every day | ORAL | Status: DC
  Administered 2020-07-24 – 2020-08-01 (×9): 1000 [IU] via ORAL
  Filled 2020-07-23 (×9): qty 1

## 2020-07-23 MED ORDER — INSULIN GLARGINE 100 UNIT/ML ~~LOC~~ SOLN
40.0000 [IU] | Freq: Every day | SUBCUTANEOUS | Status: DC
  Filled 2020-07-23 (×2): qty 0.4

## 2020-07-23 MED ORDER — LEVOTHYROXINE SODIUM 88 MCG PO TABS: 83.0000 ug | ORAL_TABLET | Freq: Every day | ORAL | Status: DC

## 2020-07-23 MED ORDER — SENNOSIDES-DOCUSATE SODIUM 8.6-50 MG PO TABS: 1.0000 | ORAL_TABLET | Freq: Every evening | ORAL | Status: DC | PRN

## 2020-07-23 MED ORDER — GABAPENTIN 300 MG PO CAPS
300.0000 mg | ORAL_CAPSULE | Freq: Every day | ORAL | Status: DC
  Administered 2020-07-24 – 2020-08-01 (×9): 300 mg via ORAL
  Filled 2020-07-23 (×9): qty 1

## 2020-07-23 MED ORDER — AMLODIPINE BESYLATE 10 MG PO TABS
10.0000 mg | ORAL_TABLET | Freq: Every day | ORAL | Status: DC
  Administered 2020-07-24 – 2020-08-01 (×9): 10 mg via ORAL
  Filled 2020-07-23 (×9): qty 1

## 2020-07-23 MED ORDER — ACETAMINOPHEN 650 MG RE SUPP: 650.0000 mg | RECTAL | Status: DC | PRN

## 2020-07-23 MED ORDER — SEVELAMER CARBONATE 800 MG PO TABS
800.0000 mg | ORAL_TABLET | Freq: Three times a day (TID) | ORAL | Status: DC
Start: 2020-07-24 — End: 2020-08-01
  Administered 2020-07-24 – 2020-08-01 (×24): 800 mg via ORAL
  Filled 2020-07-23 (×23): qty 1

## 2020-07-23 MED ORDER — VITAMIN B-12 1000 MCG PO TABS
2000.0000 ug | ORAL_TABLET | Freq: Every day | ORAL | Status: DC
  Administered 2020-07-24 – 2020-08-01 (×9): 2000 ug via ORAL
  Filled 2020-07-23 (×9): qty 2

## 2020-07-23 MED ORDER — LOSARTAN POTASSIUM 50 MG PO TABS
50.0000 mg | ORAL_TABLET | Freq: Every day | ORAL | Status: DC
  Administered 2020-07-24 – 2020-08-01 (×9): 50 mg via ORAL
  Filled 2020-07-23 (×9): qty 1

## 2020-07-23 MED ORDER — HYDRALAZINE HCL 20 MG/ML IJ SOLN: 5.0000 mg | INTRAMUSCULAR | Status: DC | PRN

## 2020-07-23 MED ORDER — INSULIN ASPART 100 UNIT/ML ~~LOC~~ SOLN
0.0000 [IU] | Freq: Three times a day (TID) | SUBCUTANEOUS | Status: DC
  Administered 2020-07-24: 6 [IU] via SUBCUTANEOUS
  Administered 2020-07-25: 5 [IU] via SUBCUTANEOUS
  Administered 2020-07-25 – 2020-07-26 (×2): 1 [IU] via SUBCUTANEOUS
  Administered 2020-07-26 (×2): 2 [IU] via SUBCUTANEOUS
  Administered 2020-07-27: 1 [IU] via SUBCUTANEOUS
  Administered 2020-07-27: 4 [IU] via SUBCUTANEOUS
  Administered 2020-07-28 (×2): 1 [IU] via SUBCUTANEOUS
  Administered 2020-07-28: 2 [IU] via SUBCUTANEOUS
  Administered 2020-07-29: 3 [IU] via SUBCUTANEOUS
  Administered 2020-07-29 (×2): 4 [IU] via SUBCUTANEOUS
  Administered 2020-07-30 – 2020-07-31 (×3): 2 [IU] via SUBCUTANEOUS
  Administered 2020-07-31: 1 [IU] via SUBCUTANEOUS
  Administered 2020-08-01: 3 [IU] via SUBCUTANEOUS

## 2020-07-23 MED ORDER — LEVOTHYROXINE SODIUM 50 MCG PO TABS: 50.0000 ug | ORAL_TABLET | Freq: Every day | ORAL | Status: DC

## 2020-07-23 MED ORDER — ACETAMINOPHEN 325 MG PO TABS: 650.0000 mg | ORAL_TABLET | ORAL | Status: DC | PRN

## 2020-07-23 MED ORDER — IOHEXOL 350 MG/ML SOLN
50.0000 mL | Freq: Once | INTRAVENOUS | Status: AC | PRN
  Administered 2020-07-23: 50 mL via INTRAVENOUS
  Filled 2020-07-23: qty 50

## 2020-07-23 MED ORDER — INSULIN GLARGINE 100 UNIT/ML ~~LOC~~ SOLN
20.0000 [IU] | Freq: Two times a day (BID) | SUBCUTANEOUS | Status: DC
  Filled 2020-07-23 (×2): qty 0.2

## 2020-07-23 MED ORDER — STROKE: EARLY STAGES OF RECOVERY BOOK
Freq: Once | Status: DC
  Filled 2020-07-23: qty 1

## 2020-07-23 MED ORDER — VITAMIN E 45 MG (100 UNIT) PO CAPS
400.0000 [IU] | ORAL_CAPSULE | Freq: Every day | ORAL | Status: DC
  Administered 2020-07-24 – 2020-08-01 (×9): 400 [IU] via ORAL
  Filled 2020-07-23 (×9): qty 4

## 2020-07-23 MED ORDER — TORSEMIDE 20 MG PO TABS
20.0000 mg | ORAL_TABLET | Freq: Every day | ORAL | Status: DC
  Administered 2020-07-24 – 2020-08-01 (×9): 20 mg via ORAL
  Filled 2020-07-23 (×9): qty 1

## 2020-07-23 MED ORDER — LEVOTHYROXINE SODIUM 75 MCG PO TABS: 75.0000 ug | ORAL_TABLET | Freq: Every day | ORAL | Status: DC

## 2020-07-23 MED ORDER — AMITRIPTYLINE HCL 25 MG PO TABS
25.0000 mg | ORAL_TABLET | Freq: Every day | ORAL | Status: DC
  Administered 2020-07-24 – 2020-07-31 (×8): 25 mg via ORAL
  Filled 2020-07-23 (×8): qty 1

## 2020-07-23 MED ORDER — CINACALCET HCL 30 MG PO TABS
60.0000 mg | ORAL_TABLET | Freq: Every day | ORAL | Status: DC
  Administered 2020-07-24 – 2020-08-01 (×9): 60 mg via ORAL
  Filled 2020-07-23 (×10): qty 2

## 2020-07-23 MED ORDER — HEPARIN SODIUM (PORCINE) 5000 UNIT/ML IJ SOLN: 5000.0000 [IU] | Freq: Two times a day (BID) | INTRAMUSCULAR | Status: DC

## 2020-07-23 MED ORDER — INSULIN GLARGINE 100 UNIT/ML SOLOSTAR PEN: 80.0000 [IU] | PEN_INJECTOR | Freq: Every day | SUBCUTANEOUS | Status: DC

## 2020-07-23 MED ORDER — ACETAMINOPHEN 160 MG/5ML PO SOLN: 650.0000 mg | ORAL | Status: DC | PRN

## 2020-07-23 MED ORDER — ATORVASTATIN CALCIUM 80 MG PO TABS
80.0000 mg | ORAL_TABLET | Freq: Every evening | ORAL | Status: DC
  Administered 2020-07-24 – 2020-08-01 (×9): 80 mg via ORAL
  Filled 2020-07-23 (×10): qty 1

## 2020-07-23 NOTE — Progress Notes (Addendum)
Patient ID: Rachael Herrera, female   DOB: [birth year], 60 y.o.   MRN: [id number]  SW spoke with pt dtr Tamela Oddi [phone]) to provide updates from team conference, and d/c date 3/16. Dtr or pt brother intends to come in tomorrow to be present for a therapy session to help assist with getting pt to participate more in therapy. SW informed will continue to provide updates. SW updated Dialysis Coord-Colleen Brigitte Pulse to update on pt d/c date.   Loralee Pacas, MSW, [place] Office: [phone] Cell: [phone] Fax: [phone]

## 2020-07-23 NOTE — Progress Notes (Signed)
PROGRESS NOTE   Subjective/Complaints:  Pt awake. Seems upset. Attempted to answer a couple of my questions but generally seemed irritated and disengaged. Nurse reports that she's been trying to get out of bed  ROS: limited due to language/communication   Objective:   No results found. Recent Labs    07/22/20 0453  WBC 7.8  HGB 12.8  HCT 38.6  PLT 241   Recent Labs    07/21/20 0528 07/22/20 0453  NA 137 135  K 3.9 4.0  CL 96* 93*  CO2 25 25  GLUCOSE 144* 112*  BUN 32* 44*  CREATININE 8.14* 9.88*  CALCIUM 9.0 9.2    Intake/Output Summary (Last 24 hours) at 07/23/2020 1121 Last data filed at 07/23/2020 0815 Gross per 24 hour  Intake 260 ml  Output -  Net 260 ml        Physical Exam: Vital Signs Blood pressure 124/67, pulse (!) 53, temperature 98.5 F (36.9 C), temperature source Oral, resp. rate 18, height '5\' 8"'$  (1.727 m), weight 112.2 kg, SpO2 92 %.   Constitutional: No distress . Vital signs reviewed. obese HEENT: EOMI, oral membranes moist Neck: supple Cardiovascular: RRR without murmur. No JVD    Respiratory/Chest: CTA Bilaterally without wheezes or rales. Normal effort    GI/Abdomen: BS +, non-tender, non-distended Ext: no clubbing, cyanosis, or edema Psych: irritable, cooperates Neurological:     Mental Status: She is alert. And awake, mumbles unintelligible words. Seemed demonstrate comprehension, nods Y/N. RUE/RLE 2-3/5. LUE/LLE 3-4/5. Sensed pain in all 4's. RIght central 7       Assessment/Plan: 1. Functional deficits which require 3+ hours per day of interdisciplinary therapy in a comprehensive inpatient rehab setting.  Physiatrist is providing close team supervision and 24 hour management of active medical problems listed below.  Physiatrist and rehab team continue to assess barriers to discharge/monitor patient progress toward functional and medical goals  Care Tool:  Bathing     Body parts bathed by patient: Right arm,Chest,Left arm,Front perineal area,Abdomen,Face,Right upper leg,Left upper leg   Body parts bathed by helper: Right lower leg,Left lower leg,Buttocks     Bathing assist Assist Level: Moderate Assistance - Patient 50 - 74%     Upper Body Dressing/Undressing Upper body dressing   What is the patient wearing?: Pull over shirt    Upper body assist Assist Level: Moderate Assistance - Patient 50 - 74%    Lower Body Dressing/Undressing Lower body dressing      What is the patient wearing?: Underwear/pull up,Pants     Lower body assist Assist for lower body dressing: Maximal Assistance - Patient 25 - 49%     Toileting Toileting    Toileting assist Assist for toileting: Minimal Assistance - Patient > 75%     Transfers Chair/bed transfer  Transfers assist     Chair/bed transfer assist level: Minimal Assistance - Patient > 75%     Locomotion Ambulation   Ambulation assist      Assist level: Moderate Assistance - Patient 50 - 74% Assistive device: Walker-rolling Max distance: 74'   Walk 10 feet activity   Assist     Assist level: Moderate Assistance - Patient -  50 - 74% Assistive device: Walker-rolling   Walk 50 feet activity   Assist    Assist level: Moderate Assistance - Patient - 50 - 74% Assistive device: Walker-rolling    Walk 150 feet activity   Assist Walk 150 feet activity did not occur: Safety/medical concerns         Walk 10 feet on uneven surface  activity   Assist     Assist level: Moderate Assistance - Patient - 50 - 74% Assistive device: Hand held assist   Wheelchair     Assist Will patient use wheelchair at discharge?: No             Wheelchair 50 feet with 2 turns activity    Assist            Wheelchair 150 feet activity     Assist          Blood pressure 124/67, pulse (!) 53, temperature 98.5 F (36.9 C), temperature source Oral, resp. rate 18,  height '5\' 8"'$  (1.727 m), weight 112.2 kg, SpO2 92 %.    Medical Problem List and Plan: 1.  Ischemic left MCA stroke             -patient may shower   -ELOS/Goals: S 8-10 days--team conference today. Refusing to participate often. Will reach out to daughter who has questions and ask her to help [place] with engagement.  2.  Antithrombotics: -DVT/anticoagulation:  Pharmaceutical: Other (comment)--Continue Eliquis             -antiplatelet therapy: N/A 3. Pain Management: Tylenol prn. 4. Mood: suspect there is some depression going on here. Will speak with daughter as above.               -antipsychotic agents: N/A 5. Neuropsych: This patient is not  capable of making decisions on her own behalf. 6. Skin/Wound Care: Routine pressure relief measures.  7. Fluids/Electrolytes/Nutrition: Strict I/O. Monitor weights daily              --Renal/CM diet with 1200 cc/FR  -labs have been reviewed  8. ESRD: Schedule HD at the end of the day to help with tolerance of therapy.             -- TTS during hospitalization.              --hyponatremia stable and hyperkalemia resolved (post lokelma/HD).   2/26- Dialysis T/H/S 9. A flutter/Bradycardia: Has failed attempts at R-PPM and to follow up with primary cardiology for leadless pacemaker             -HR in 50's at baseline now 10. HTN: Monitor BP tid --Amlodipine and Cozaar resumed on 02/23. Systolic BP elevated and diastolic soft, continue as per nephrology recommendations  -3/1 bp controlled 11. T2DM: Hgb A1C- 8.5. Was on Lantus 80 units/hs PTA.              --Monitor BS ac/hs and titrate lantus for better control.              3/1 Lantus increased to 18 units BID 2/27---still elevated   -increase to 20u bid today 12. OSA: Resume CPAP--question compliance. 13. Obesity (BMI 39.15): provide dietary counseling as appropriate  LOS: 5 days A FACE TO FACE EVALUATION WAS PERFORMED  Meredith Staggers 07/23/2020, 11:21 AM

## 2020-07-23 NOTE — Patient Care Conference (Signed)
Inpatient RehabilitationTeam Conference and Plan of Care Update Date: 07/23/2020   Time: 10:42 AM    Patient Name: Rachael Herrera      Medical Record Number: [id number]  Date of Birth: 1960/12/27 Sex: Female         Room/Bed: 4W16C/4W16C-01 Payor Info: Payor: [employer] MEDICARE / Plan: [facility] MEDICARE / Product Type: *No Product type* /    Admit Date/Time:  07/18/2020  5:33 PM  Primary Diagnosis:  Acute ischemic left MCA stroke [facility])  Hospital Problems: Principal Problem:   Acute ischemic left MCA stroke [facility]) Active Problems:   Essential hypertension   Obesity   ESRD on dialysis ([place])   Bradycardia   Combined receptive and expressive aphasia    Expected Discharge Date: Expected Discharge Date: 08/07/20  Team Members Present: Physician leading conference: Dr. Alger Simons Care Coodinator Present: Loralee Pacas, LCSWA;Hobert Poplaski Creig Hines, RN, BSN, [place] Nurse Present: Other (comment) Rodena Goldmann, RN) PT Present: Tereasa Coop, PT OT Present: Cherylynn Ridges, OT SLP Present: Weston Anna, SLP PPS Coordinator present : Gunnar Fusi, SLP     Current Status/Progress Goal Weekly Team Focus  Bowel/Bladder   Continent B/B. LBM 07/21/2020  Remain continent.  Assess Qshift and PRN   Swallow/Nutrition/ Hydration   Dys. 2 textures with thin liquids, Mod A  Min A  use of swallowing strategies, tolerance of diet   ADL's   Min A overall, occasional LOB requriing mod A to correct  Supervision/mod I  Self-care retraining, activity tolerance, balance, general strengthening   Mobility   minA bed mobility, CGA/minA sit to stand transfer, gait ~100' with HHA or RW HHA  Supervision  consistent participation, balance, transfers, ambulation   Communication   Max-Total A  Min A  auditory comprehension for basic information, multimodal communication, expression of wants/needs   Safety/Cognition/ Behavioral Observations  Max A  Mod A  emergent awareness of errors   Pain   No C/O pain   Pain <3/10  Assess Qshift and PRN   Skin   Skin intact  Maintain skin integrity  Assess Qshift and PRN     Discharge Planning:  Per pt dtr, pt will have 24/7 care between herself, and 2 of patient's siblings. Pt dtr is due to give birth within the next week as she is a scheduled c-section. Dialysis pt/TTS 1st shift at [facility].   Team Discussion: Resistant to any interventions. Alert with confusion, getting up unassisted, CBG's okay, no complaints of pain. Has HD today, then plan is to move her to 4W16 to be closer to the desk. Lives with daughter, daughter is having a scheduled C-section and is very overwhelmed.   Patient on target to meet rehab goals: Patient is a hit or miss with participation. Had a loss of balance yesterday but no fall. Min assist currently. Tries to refuse therapy, currently mod assist with RW, she pushes it way out in front of her. She is on a dys 2, thin liquid diet with min assist goals.  *See Care Plan and progress notes for long and short-term goals.   Revisions to Treatment Plan:  None at this time.  Teaching Needs: Family education, medication management, skin/wound care, pain management, transfer training, gait training, participation education,.  Current Barriers to Discharge: Inaccessible home environment, Decreased caregiver support, Medical stability, Home enviroment access/layout, Incontinence, Lack of/limited family support, Hemodialysis, Medication compliance, Behavior and Nutritional means  Possible Resolutions to Barriers: Continue current medications, offer nutritional support, provide emotional support.  Medical Summary Current Status: Left MCA/PCA infarct with aphasia. BP controlled. bradycardia. cbg's improving with adjustment of regimen  Barriers to Discharge: Medical stability   Possible Resolutions to Barriers/Weekly Focus: daily lab assessment, bp/hr mgt   Continued Need for Acute Rehabilitation Level of Care: The  patient requires daily medical management by a physician with specialized training in physical medicine and rehabilitation for the following reasons: Direction of a multidisciplinary physical rehabilitation program to maximize functional independence : Yes Medical management of patient stability for increased activity during participation in an intensive rehabilitation regime.: Yes Analysis of laboratory values and/or radiology reports with any subsequent need for medication adjustment and/or medical intervention. : Yes   I attest that I was present, lead the team conference, and concur with the assessment and plan of the team.   Cristi Loron 07/23/2020, 6:48 PM

## 2020-07-23 NOTE — Progress Notes (Addendum)
Stat  EEG complete - results pending.  

## 2020-07-23 NOTE — Procedures (Signed)
I was present at this dialysis session. I have reviewed the session itself and made appropriate changes.   Vital signs in last 24 hours:  Temp:  [97.8 F (36.6 C)-98.5 F (36.9 C)] 97.8 F (36.6 C) (03/01 1241) Pulse Rate:  [53-58] 58 (03/01 1300) Resp:  [15-20] 16 (03/01 1300) BP: (94-133)/(42-79) 94/48 (03/01 1300) SpO2:  [92 %-100 %] 92 % (03/01 1241) Weight:  [111.9 kg] 111.9 kg (03/01 1241) Weight change:  Filed Weights   07/21/20 0536 07/22/20 0510 07/23/20 1241  Weight: 112.2 kg 112.2 kg 111.9 kg    Recent Labs  Lab 07/22/20 0453  NA 135  K 4.0  CL 93*  CO2 25  GLUCOSE 112*  BUN 44*  CREATININE 9.88*  CALCIUM 9.2  PHOS 5.2*    Recent Labs  Lab 07/19/20 0505 07/22/20 0453 07/23/20 1300  WBC 6.4 7.8 8.2  NEUTROABS 3.6  --   --   HGB 11.3* 12.8 11.7*  HCT 35.9* 38.6 37.1  MCV 96.5 94.4 95.6  PLT 216 241 241    Scheduled Meds: . amitriptyline  25 mg Oral QHS  . amLODipine  10 mg Oral Daily  . apixaban  5 mg Oral BID  . atorvastatin  80 mg Oral Daily  . Chlorhexidine Gluconate Cloth  6 each Topical Q0600  . cinacalcet  60 mg Oral Q breakfast  . feeding supplement (NEPRO CARB STEADY)  237 mL Oral TID BM  . insulin aspart  0-5 Units Subcutaneous QHS  . insulin aspart  0-9 Units Subcutaneous TID WC  . insulin glargine  20 Units Subcutaneous BID  . levothyroxine  75 mcg Oral QAC breakfast  . losartan  100 mg Oral Daily  . mouth rinse  15 mL Mouth Rinse BID  . multivitamin  1 tablet Oral QHS  . nystatin  5 mL Oral QID  . sevelamer carbonate  800 mg Oral TID WC   Continuous Infusions: PRN Meds:.acetaminophen, bisacodyl, diphenhydrAMINE, guaiFENesin-dextromethorphan, milk and molasses, polyethylene glycol, prochlorperazine **OR** prochlorperazine **OR** prochlorperazine, senna-docusate, traZODone   Donetta Potts,  MD 07/23/2020, 1:19 PM

## 2020-07-23 NOTE — Discharge Summary (Signed)
Physician Discharge Summary  Patient ID: CHAVELLE GAMBLER MRN: [id number] DOB/AGE: [birth year] 60 y.o.  Admit date: 07/18/2020 Discharge date: 07/23/2020  Discharge Diagnoses:  Principal Problem:   Acute ischemic left MCA stroke [facility]) Active Problems:   Essential hypertension   Obesity   ESRD on dialysis ([place])   Bradycardia   Combined receptive and expressive aphasia   Discharged Condition: stable   Significant Diagnostic Studies: CT ANGIO HEAD W OR WO CONTRAST  Result Date: 07/23/2020 CLINICAL DATA:  Prior stroke, bleed on CT head EXAM: CT ANGIOGRAPHY HEAD TECHNIQUE: Multidetector CT imaging of the head was performed using the standard protocol during bolus administration of intravenous contrast. Multiplanar CT image reconstructions and MIPs were obtained to evaluate the vascular anatomy. CONTRAST:  57m OMNIPAQUE IOHEXOL 350 MG/ML SOLN COMPARISON:  Correlation made with prior CT and MR imaging FINDINGS: CTA HEAD Anterior circulation: Intracranial internal carotid arteries are patent with calcified plaque causing mild stenosis. Recanalization of prior thrombus with now patent left middle cerebral artery. Improved flow within the distal left MCA territory. Right middle cerebral artery and both anterior cerebral arteries are patent. Posterior circulation: Intracranial vertebral, basilar, and posterior cerebral arteries are patent. Left posterior communicating artery is present. Venous sinuses: Not well evaluated. Review of the MIP images confirms the above findings IMPRESSION: Left MCA is now patent.  No new abnormality. Electronically Signed   By: PMacy MisM.D.   On: 07/23/2020 17:18   CT HEAD WO CONTRAST  Addendum Date: 07/23/2020   ADDENDUM REPORT: 07/23/2020 17:06 ADDENDUM: These results were called by telephone at the time of interpretation on 07/23/2020 at 4:55 pm to provider Dr. MKathrynn Speed who verbally acknowledged these results. As discussed at this time, petechial hemorrhage  was present within the medial left occipital lobe on the prior brain MRI of 07/12/2020. Electronically Signed   By: KKellie SimmeringDO   On: 07/23/2020 17:06   Result Date: 07/23/2020 CLINICAL DATA:  Mental status change, unknown cause. EXAM: CT HEAD WITHOUT CONTRAST TECHNIQUE: Contiguous axial images were obtained from the base of the skull through the vertex without intravenous contrast. COMPARISON:  Brain MRI 07/12/2020. Report from thrombectomy 07/11/2020. CT angiogram head/neck 07/11/2020. FINDINGS: Brain: Mild cerebral and cerebellar atrophy. New from prior exams, there is a 1.9 x 1.3 cm focus of acute parenchymal hemorrhage within the paramedian left occipital lobe at site of a known subacute infarct left PCA territory. Petechial hemorrhage is present within the left caudate and lentiform nuclei and within the cortex of the left frontotemporal operculum, left insula and left temporal lobe at site of a known subacute left MCA territory infarct. Minimal mass effect with subtle partial effacement of the left lateral ventricle. No midline shift. No extra-axial fluid collection. No evidence of intracranial mass. Vascular: No hyperdense vessel.  Atherosclerotic calcifications. Skull: Normal. Negative for fracture or focal lesion. Sinuses/Orbits: Rightward gaze. Moderate partial opacification of right ethmoid air cells posteriorly. Minimal partial opacification of left ethmoid air cells. IMPRESSION: 1.9 x 1.3 cm acute parenchymal hemorrhage within the paramedian left occipital lobe at site of a known subacute left PCA territory infarct. Petechial hemorrhage within the left basal ganglia, left frontoparietal operculum, left insula and left temporal lobe at site of a known subacute left MCA territory infarct. Subtle mass effect with partial effacement of the left lateral ventricle. No midline shift. Electronically Signed: By: KKellie SimmeringDO On: 07/23/2020 16:45      Labs:  Basic Metabolic Panel: Recent Labs   Lab 07/17/20  [id number] 07/18/20 0321 07/19/20 0505 07/21/20 0528 07/22/20 0453 07/23/20 1300 07/23/20 1723  NA 134* 132* 134* 137 135 133* 135  K 4.3 4.5 4.2 3.9 4.0 4.4 4.2  CL 93* 93* 95* 96* 93* 93* 93*  CO2 '24 25 25 25 25 '$ 20* 26  GLUCOSE 195* 237* 192* 144* 112* 268* 136*  BUN 30* 44* 32* 32* 44* 61* 31*  CREATININE 7.96* 9.38* 7.66* 8.14* 9.88* 12.72* 8.25*  CALCIUM 8.6* 8.7* 8.8* 9.0 9.2 8.9 9.0  PHOS  --   --   --  5.1* 5.2* 6.7*  --     CBC: Recent Labs  Lab 07/19/20 0505 07/22/20 0453 07/23/20 1300  WBC 6.4 7.8 8.2  NEUTROABS 3.6  --   --   HGB 11.3* 12.8 11.7*  HCT 35.9* 38.6 37.1  MCV 96.5 94.4 95.6  PLT 216 241 241    CBG: Recent Labs  Lab 07/22/20 2105 07/23/20 0555 07/23/20 1120 07/23/20 1535 07/23/20 1711  GLUCAP 229* 111* 245* 107* 122*    Brief HPI:   NALONI PORTES is a 60 y.o. female with history of HTN, T2DM with peripheral neuropathy, remote history of hypoglycemic seizure, tachybrady syndrome (failed attempts at [facility]) who was admitted on 07/11/20 with right sided weakness, left gaze preference and inability to talk. CTA head showed acuatae left occipital lobe infarct and non occlusive thrombus at [facility] bifurcation as well as occlusion of left M2 MCA branch. She underwent cerebral angio with mechanical thrombectomy and TICI 3 revascualirzation. Follow up MRI brain showed acute ischemic infarcts in L-MCA/PCA territories and small amount of petechial blood in left basal ganglia. She did develop atrial flutter and Dr. Lurline Hare recommended starting [facility] which she was transitioned to on 02/22. Hospital course significant for bradycardia but cardiology felt that PPM not urgently needed, episodic hypoglycemia as well as upward trend in BP. She continued to be limited by global aphasia with right sided weakness wit sensory deficits and left gaze preference affecting ADLs and mobility. CIR was recommended due to functional decline.    Hospital Course: PALYN WEDEKIND  was admitted to rehab 07/18/2020 for inpatient therapies to consist of PT, ST and OT at least three hours five days a week. Past admission physiatrist, therapy team and rehab RN have worked together to provide customized collaborative inpatient rehab. She was maintained on Eliquis and follow up H/H was relatively stable. Follow up BMET showed fluctuating hyponatremia. BP was stable with heart rates in 50 range. Blood sugars were monitored on ac/hs basis and Lantus was slowly being titrated for better control. CPAP ordered but patient continued to refuse it.    At admission, patient required mod assist with ADL tasks and min assist with mobility. She exhibited severe non-fluent aphasia expressive> receptive impairments with ability to communicate social exchanges at word level. On 03/01, patient became unresponsive and CT head done revealing acute left occipital hemorrhage at site of prior infarct as well as petechial hemorrhage within site of prior L-MCA infarct. Neurology consulted and CTA head done showing L-MCA to now be patent and was negative for acute abnormality. Her LOC improved once back to rehab unit but due to concerns of cardiac episode, cardiology was consulted for work up. EEG also ordered to rule out seizures and patient transferred to acute for closer monitoring.     Scheduled  Medications:  . amitriptyline  25 mg Oral QHS  . amLODipine  10 mg Oral Daily  . apixaban  5 mg Oral BID  .  atorvastatin  80 mg Oral Daily  . Chlorhexidine Gluconate Cloth  6 each Topical Q0600  . cinacalcet  60 mg Oral Q breakfast  . feeding supplement (NEPRO CARB STEADY)  237 mL Oral TID BM  . insulin aspart  0-5 Units Subcutaneous QHS  . insulin aspart  0-9 Units Subcutaneous TID WC  . insulin glargine  20 Units Subcutaneous BID  . levothyroxine  75 mcg Oral QAC breakfast  . losartan  100 mg Oral Daily  . mouth rinse  15 mL Mouth Rinse BID  . multivitamin  1 tablet Oral QHS  . nystatin  5 mL Oral QID  .  sevelamer carbonate  800 mg Oral TID WC     Diet: Dysphagia 2, thin liquids--1200 cc Fr.   Disposition: To acute hospital.      Signed: Bary Leriche 07/23/2020, 6:16 PM

## 2020-07-23 NOTE — Significant Event (Addendum)
Stroke Response Event Note   Reason for Call : Altered Mental Status while in Dialysis   Initial Focused Assessment: Unresponsive except for pain Bilateral arm and leg weakness, aphasic per baseline, See Stroke Assessment for NIHSS and VS.  Interventions:  CT Head, ABG, BMP, and Ammonia  Plan of Care:  Remain in Rehab, No need for Tx.    Event Summary: Patient was completing dialysis. After completion, pt was noted to fall asleep and when RN went to arouse the patient she was unable to be woken. Rapid Response Called and called this RN to evaluate. Upon assessment, pt was noted to be drowsy only responding to painful stimuli. MD called and came to bedside.   Not suspected to be new stroke at this time. Altered Mental Status work up completed.   MD Notified: Leonel Ramsay Call Time: [id number] Arrival Time: 1600 End Time: [id number]  Kathrin Greathouse, RN

## 2020-07-23 NOTE — Progress Notes (Addendum)
Pt is not available for EEG at the moment due to relocating to another floor.

## 2020-07-23 NOTE — Significant Event (Addendum)
Rapid Response Event Note   Reason for Call :  Decreased LOC  Per HD RN, pt fell asleep towards the end of her HD treatment. When RN went to wake her, the pt was hard to awake up and could not remain awake.   Initial Focused Assessment:  Pt lying in bed. Gaze preference to the right- she will cross midline. She squeezes eyes shut upon pupil assessment, facial strength is equal. PERRLA, 31m. She withdrawals to painful stimuli. Extremity movements appear equal. She is not speaking, she will not follow commands.   Pt not in distress. Breathing is normal, unlabored. Lungs are clear. Skin is warm, dry. Pulses are 1+, irregular. HR 48-80 bpm. BP reviewed during HD treatment, no hypotension noted- see flowsheet.   VS: T 97.9, BP 119/54, HR 50, RR 11, SpO2 96% on room air CBG: 107  Upon completion of CT scans, pt reassessed and appears to be returning back to baseline. She was awake, looking around. She will sit up in bed and is appropriately moving all extremities. Intermittently starring. Global aphasia. Disorientation.   Interventions:  -ABG  7.439/ 39.5/ 88.1/ 26.3 -CT, CTA -LA, BMP, TSH, Ammonia  Plan of Care:  -Admit to inpatient for closer monitoring  Call rapid response for additional needs  Event Summary:  MD Notified: Dr. SNaaman Plummer Dr. KLeonel RamsayCall Time: 1GamalielTime: [id number] Time: 1[place] RN

## 2020-07-23 NOTE — Progress Notes (Signed)
Pt returned to unit after rapid response in hemodialysis. Pt is currently stable and at baseline. Pt has yellow mews escalated to charge nurse. PA Reesa Chew at bedside. Pt is currently awaiting bed transfer to higher level of care.

## 2020-07-23 NOTE — H&P (Signed)
History and Physical    Rachael Herrera [id number] DOB: 1961-04-04 DOA: 07/23/2020  PCP: Patient, No Pcp Per (Confirm with patient/family/NH records and if not entered, this has to be entered at [facility] point of entry) Patient coming from: Acute rehab  I have personally briefly reviewed patient's old medical records in [place]  Chief Complaint: Bradycardia  HPI: Rachael Herrera is a 60 y.o. female with medical history significant of recent embolic ischemic stroke on left MCA and PCA territory with small amount of petechial bleed in left basal ganglia status post thrombectomy, ESRD on HD, paroxysmal A. fib/A-flutter, hypothyroidism, IDDM, HTN, HLD, morbid obesity, OSA on at bedtime CPAP, who was just admitted to rehab unit yesterday presented with recurrent bradycardia and altered mentation.  Patient was at dialysis this afternoon the became unresponsive, RRT was called and postpone patient in significant bradycardia, EKG showed heart rate 52. CT head showed 1.9 x 1.3 cm acute parenchymal hemorrhage within paramedian left occipital lobe, and petechial hemorrhage within left basal ganglia left frontoparietal, left insular the left temporal lobe.  Subtle mass-effect with partial effacement of the left lateral ventricle.  And plan to transfer to cardiac PCU for close monitoring.  Patient more awake, significantly confused, unable to give much of history, but denied any headache vision changes, no feeling of nausea or vomit.  Review of Systems: Unable to perform, patient confused.   Past Medical History:  Diagnosis Date  . Cavitary pneumonia 06/2017   due to MSSA  . Claudication of both lower extremities ([place])   . DDD (degenerative disc disease), cervical   . DDD (degenerative disc disease), lumbosacral   . Diabetes mellitus ([place])   . Diabetic neuropathy ([place])   . ESRD (end stage renal disease) on dialysis ([place])   . GERD (gastroesophageal reflux disease)   . NSVT (nonsustained  ventricular tachycardia) ([place]) 10/20201  . OSA on CPAP   . Ulnar neuropathy    BUE  . Vitamin B 12 deficiency     Past Surgical History:  Procedure Laterality Date  . IR ANGIO VERTEBRAL SEL VERTEBRAL UNI L MOD SED  07/11/2020  . IR CT HEAD LTD  07/11/2020  . IR PERCUTANEOUS ART THROMBECTOMY/INFUSION INTRACRANIAL INC DIAG ANGIO  07/11/2020  . IR US GUIDE VASC ACCESS LEFT  07/12/2020  . RADIOLOGY WITH ANESTHESIA N/A 07/11/2020   Procedure: IR WITH ANESTHESIA;  Surgeon: Radiologist, Medication, MD;  Location: [facility];  Service: Radiology;  Laterality: N/A;     reports that she has never smoked. She has never used smokeless tobacco. She reports previous alcohol use. She reports previous drug use.  No Known Allergies  Family History  Problem Relation Age of Onset  . Diabetes Mother   . Heart disease Mother   . Diabetes Father   . Heart disease Father   . Diabetes Sister      Prior to Admission medications   Medication Sig Start Date End Date Taking? Authorizing Provider  amitriptyline (ELAVIL) 25 MG tablet Take 25 mg by mouth at bedtime. 06/16/20   [provider]  amLODipine (NORVASC) 10 MG tablet Take 10 mg by mouth daily. 06/16/20   [provider]  atorvastatin (LIPITOR) 80 MG tablet Take 80 mg by mouth every evening. 04/06/19   [provider]  celecoxib (CELEBREX) 100 MG capsule Take 100 mg by mouth daily. 06/16/20   [provider]  cholecalciferol (VITAMIN D3) 25 MCG (1000 UNIT) tablet Take 1,000 Units by mouth daily.  [provider]  cinacalcet (SENSIPAR) 60 MG tablet Take 60 mg by mouth daily.    [provider]  gabapentin (NEURONTIN) 300 MG capsule Take 300 mg by mouth daily. 04/30/14   [provider]  HUMALOG 100 UNIT/ML injection Inject 3-13 Units into the skin 3 (three) times daily before meals. Per sliding scale 06/25/20   [provider]  insulin glargine (LANTUS) 100 UNIT/ML Solostar Pen Inject 80  Units into the skin at bedtime. 02/22/18   [provider]  losartan (COZAAR) 50 MG tablet Take 50 mg by mouth daily. 06/16/20   [provider]  sevelamer carbonate (RENVELA) 800 MG tablet Take 800 mg by mouth 3 (three) times daily. 06/16/20   [provider]  SYNTHROID 75 MCG tablet Take 75 mcg by mouth daily. 06/16/20   [provider]  torsemide (DEMADEX) 20 MG tablet Take 20 mg by mouth daily. 06/16/20   [provider]  vitamin B-12 (CYANOCOBALAMIN) 1000 MCG tablet Take 2,000 mcg by mouth daily.    [provider]  vitamin E 180 MG (400 UNITS) capsule Take 400 Units by mouth daily.    [provider]    Physical Exam: Vitals:   07/23/20 1957  SpO2: 91%    Constitutional: NAD, calm, comfortable Vitals:   07/23/20 1957  SpO2: 91%   Eyes: PERRL, lids and conjunctivae normal ENMT: Mucous membranes are moist. Posterior pharynx clear of any exudate or lesions.Normal dentition.  Neck: normal, supple, no masses, no thyromegaly Respiratory: clear to auscultation bilaterally, no wheezing, no crackles. Normal respiratory effort. No accessory muscle use.  Cardiovascular: Regular rate and rhythm, no murmurs / rubs / gallops. No extremity edema. 2+ pedal pulses. No carotid bruits.  Abdomen: no tenderness, no masses palpated. No hepatosplenomegaly. Bowel sounds positive.  Musculoskeletal: no clubbing / cyanosis. No joint deformity upper and lower extremities. Good ROM, no contractures. Normal muscle tone.  Skin: no rashes, lesions, ulcers. No induration Neurologic: No facial droop, moving all limbs, following simple commands. Psychiatric: Awake alert but confused   Labs on Admission: I have personally reviewed following labs and imaging studies  CBC: Recent Labs  Lab 07/17/20 0420 07/18/20 0321 07/19/20 0505 07/22/20 0453 07/23/20 1300  WBC 6.3 7.4 6.4 7.8 8.2  NEUTROABS  --   --  3.6  --   --   HGB 10.9* 10.7* 11.3* 12.8  11.7*  HCT 33.3* 33.8* 35.9* 38.6 37.1  MCV 96.0 96.6 96.5 94.4 95.6  PLT 184 214 216 241 [id number]   Basic Metabolic Panel: Recent Labs  Lab 07/19/20 0505 07/21/20 0528 07/22/20 0453 07/23/20 1300 07/23/20 1723  NA 134* 137 135 133* 135  K 4.2 3.9 4.0 4.4 4.2  CL 95* 96* 93* 93* 93*  CO2 '25 25 25 '$ 20* 26  GLUCOSE 192* 144* 112* 268* 136*  BUN 32* 32* 44* 61* 31*  CREATININE 7.66* 8.14* 9.88* 12.72* 8.25*  CALCIUM 8.8* 9.0 9.2 8.9 9.0  PHOS  --  5.1* 5.2* 6.7*  --    GFR: Estimated Creatinine Clearance: 9.3 mL/min (A) (by C-G formula based on SCr of 8.25 mg/dL (H)). Liver Function Tests: Recent Labs  Lab 07/19/20 0505 07/21/20 0528 07/22/20 0453 07/23/20 1300  AST 17  --   --   --   ALT 16  --   --   --   ALKPHOS 76  --   --   --   BILITOT 0.8  --   --   --  PROT 7.0  --   --   --   ALBUMIN 3.3* 3.5 3.6 3.6   No results for input(s): LIPASE, AMYLASE in the last 168 hours. Recent Labs  Lab 07/23/20 1612  AMMONIA <9*   Coagulation Profile: No results for input(s): INR, PROTIME in the last 168 hours. Cardiac Enzymes: No results for input(s): CKTOTAL, CKMB, CKMBINDEX, TROPONINI in the last 168 hours. BNP (last 3 results) No results for input(s): PROBNP in the last 8760 hours. HbA1C: No results for input(s): HGBA1C in the last 72 hours. CBG: Recent Labs  Lab 07/22/20 2105 07/23/20 0555 07/23/20 1120 07/23/20 1535 07/23/20 1711  GLUCAP 229* 111* 245* 107* 122*   Lipid Profile: No results for input(s): CHOL, HDL, LDLCALC, TRIG, CHOLHDL, LDLDIRECT in the last 72 hours. Thyroid Function Tests: Recent Labs    07/23/20 1612  TSH 13.148*   Anemia Panel: No results for input(s): VITAMINB12, FOLATE, FERRITIN, TIBC, IRON, RETICCTPCT in the last 72 hours. Urine analysis: No results found for: COLORURINE, APPEARANCEUR, LABSPEC, PHURINE, GLUCOSEU, HGBUR, BILIRUBINUR, KETONESUR, PROTEINUR, UROBILINOGEN, NITRITE, LEUKOCYTESUR  Radiological Exams on Admission: CT  ANGIO HEAD W OR WO CONTRAST  Result Date: 07/23/2020 CLINICAL DATA:  Prior stroke, bleed on CT head EXAM: CT ANGIOGRAPHY HEAD TECHNIQUE: Multidetector CT imaging of the head was performed using the standard protocol during bolus administration of intravenous contrast. Multiplanar CT image reconstructions and MIPs were obtained to evaluate the vascular anatomy. CONTRAST:  88m OMNIPAQUE IOHEXOL 350 MG/ML SOLN COMPARISON:  Correlation made with prior CT and MR imaging FINDINGS: CTA HEAD Anterior circulation: Intracranial internal carotid arteries are patent with calcified plaque causing mild stenosis. Recanalization of prior thrombus with now patent left middle cerebral artery. Improved flow within the distal left MCA territory. Right middle cerebral artery and both anterior cerebral arteries are patent. Posterior circulation: Intracranial vertebral, basilar, and posterior cerebral arteries are patent. Left posterior communicating artery is present. Venous sinuses: Not well evaluated. Review of the MIP images confirms the above findings IMPRESSION: Left MCA is now patent.  No new abnormality. Electronically Signed   By: PMacy MisM.D.   On: 07/23/2020 17:18   CT HEAD WO CONTRAST  Addendum Date: 07/23/2020   ADDENDUM REPORT: 07/23/2020 17:06 ADDENDUM: These results were called by telephone at the time of interpretation on 07/23/2020 at 4:55 pm to provider Dr. MKathrynn Speed who verbally acknowledged these results. As discussed at this time, petechial hemorrhage was present within the medial left occipital lobe on the prior brain MRI of 07/12/2020. Electronically Signed   By: KKellie SimmeringDO   On: 07/23/2020 17:06   Result Date: 07/23/2020 CLINICAL DATA:  Mental status change, unknown cause. EXAM: CT HEAD WITHOUT CONTRAST TECHNIQUE: Contiguous axial images were obtained from the base of the skull through the vertex without intravenous contrast. COMPARISON:  Brain MRI 07/12/2020. Report from thrombectomy  07/11/2020. CT angiogram head/neck 07/11/2020. FINDINGS: Brain: Mild cerebral and cerebellar atrophy. New from prior exams, there is a 1.9 x 1.3 cm focus of acute parenchymal hemorrhage within the paramedian left occipital lobe at site of a known subacute infarct left PCA territory. Petechial hemorrhage is present within the left caudate and lentiform nuclei and within the cortex of the left frontotemporal operculum, left insula and left temporal lobe at site of a known subacute left MCA territory infarct. Minimal mass effect with subtle partial effacement of the left lateral ventricle. No midline shift. No extra-axial fluid collection. No evidence of intracranial mass. Vascular: No hyperdense vessel.  Atherosclerotic calcifications. Skull: Normal. Negative for fracture or focal lesion. Sinuses/Orbits: Rightward gaze. Moderate partial opacification of right ethmoid air cells posteriorly. Minimal partial opacification of left ethmoid air cells. IMPRESSION: 1.9 x 1.3 cm acute parenchymal hemorrhage within the paramedian left occipital lobe at site of a known subacute left PCA territory infarct. Petechial hemorrhage within the left basal ganglia, left frontoparietal operculum, left insula and left temporal lobe at site of a known subacute left MCA territory infarct. Subtle mass effect with partial effacement of the left lateral ventricle. No midline shift. Electronically Signed: By: Kellie Simmering DO On: 07/23/2020 16:45   EEG adult  Result Date: 07/23/2020 Lora Havens, MD     07/23/2020  7:22 PM Patient Name: Rachael Herrera MRN: [id number] Epilepsy Attending: Lora Havens Referring Physician/Provider: Clance Boll Date: 07/23/2020 Duration: 24.20 mins Patient history:  60 yo F with Left PCA infarct and overlying hemorrhage noted to have transient unresponsive episode of unknown etiology. EEG to evaluate for seizure Level of alertness: Awake AEDs during EEG study: None Technical aspects: This EEG study was  done with scalp electrodes positioned according to the 10-20 International system of electrode placement. Electrical activity was acquired at a sampling rate of '500Hz'$  and reviewed with a high frequency filter of '70Hz'$  and a low frequency filter of '1Hz'$ . EEG data were recorded continuously and digitally stored. Description: The posterior dominant rhythm consists of 9 Hz activity of moderate voltage (25-35 uV) seen predominantly in posterior head regions, symmetric and reactive to eye opening and eye closing. EEG showed ntermittent 3 to 6 Hz theta-delta slowing in left temporal region. Hyperventilation and photic stimulation were not performed.   ABNORMALITY -Intermittent slow, left temporal region. IMPRESSION: This study is suggestive of nonspecific left temporal dysfunction. No seizures or epileptiform discharges were seen throughout the recording. Priyanka Barbra Sarks    EKG: Independently reviewed.  Sinus bradycardia  Assessment/Plan Active Problems:   Bradycardia  (please populate well all problems here in Problem List. (For example, if patient is on BP meds at home and you resume or decide to hold them, it is a problem that needs to be her. Same for CAD, COPD, HLD and so on)  Question of acute vs subacute left occipital hemorrhagic stroke -Reported 1.9 x 1.3 cm acute hemorrhage on left occipital territory as reported by neurology, discussed with on-call neurology Dr. Leonel Ramsay, who compared to MRI few days ago and impression is the bleeding findings today represent an old event.  Per neurology recommend repeat CT head tomorrow and hold Eliquis for now.  -Hold anticoagulation and antiplatelet and chemical DVT prophylaxis. -Stringent BP control, aiming at 130/80.  Given the recent bradycardia, will use IV hydralazine PRN. -Seizure precautions -BUN low, will not give DDAVP this time.  Syncope versus seizure -Clinical impression is syncope secondary from a hemorrhagic stroke and bradycardia. -EEG is  being done.  Bradycardia -Recurrent, probably related to intracranial bleed. -History of bradycardia, failed PPM insertion on last admission -Avoid beta blocking -Cardiology on board  Hypothyroidism -TSH>10, T4 low, appears to indicate insufficient Synthroid dosage. -Not clear how much the hypothyroid to contribute to bradycardia, will attempt to increase Synthroid dosage from 26mg to 81.5 mcg and repeat TSH in 4 to 6 weeks.  PAF -No antibiotic or due to recent bradycardia episode  IDDM -FS <120 x2 today, will cut down Lantus dosage to avoid hypoglycemia -Continue sliding scale.  ESRD -As per nephro  DVT prophylaxis: SCD Code Status: Full Code Family Communication:  Daughter's message Disposition Plan: Expect more than 2 midnight hospital stay for neurology work-up and bradycardia investigation Consults called: Cardiology, neurology, nephrology Admission status: PCU   Lequita Halt MD [facility] Pager [phone]  07/23/2020, 8:13 PM

## 2020-07-23 NOTE — Consult Note (Signed)
Neurology consult  CC: unresponsive  History is obtained from: chart, beside RN  HPI: Rachael Herrera is a 60 y.o. female with a PMHx of recent stroke hospitalized 2/17 - 2/24 and was admitted to CIR here. Patient presented on 07/11/20 as a code stroke from HD with no movement of her right side. NIHSS was 24 with CTH showing subacute left occiptal infarct with CTA showing left MCA bifurcation abnormal with branch occlusion and no flow in left PCA. Patient went for mechanical  thrombectomy at that time in left M2/MCA posterior division branch. She also had small amount of petechial blood products in the left basal ganglion.   She was aphasic and not following commands the following morning. By 07/18/20 on d/c note she was plegic on the right with some withdrawal to pain. She was awake, alert, mute and not following commands on discharge. Last rehab note states that patient was awake and irritated. Mumbling words that were unintelligible. Nodded yes/no.   Neurology was called to patient's bedside in HD due to her becoming acutely unresponsive. Rapid response was initiated. Per HD RN, patient had been alert and nodding yes and no prior to incident. Sugar 107, BP 95-117, HR 48-54. ABG was ordered and no hypercarbia. Took patient to CT where it was thought she had a posterior bleed, but Dr. Leonel Ramsay thought this was old. At CTA head was done which showed patent arteries from the thrombectomy with no hemorrhage.   Thought was given to putting patient back in the hospital. However, after CTA patient began to be alert and talking some. By the time we reached her rehab room, she was talking and alert and following commands. No weakness noted on exam. She yelled out her daughter's name.   Discussed with Dr. Leonel Ramsay the plan. Given patient returned to baseline and no acute finding on CTA/CT head, patient may stay on rehab floor and not be transferred back to hospital. Eliquis was d/c'd and patient will have  f/up CTH in the morning. Further labs are pending.   However, when NP was on rehab floor the PA had asked Hospitalist for a consult. So, transferring the patient back to the hospital is the present plan. Whatever they are comfortable with, we agree. It appears a cardiology consult has been called for bradycardia.    ROS: Unable to assess due to altered mental status.   Past Medical History:  Diagnosis Date  . Cavitary pneumonia 06/2017   due to MSSA  . Claudication of both lower extremities ([place])   . DDD (degenerative disc disease), cervical   . DDD (degenerative disc disease), lumbosacral   . Diabetes mellitus ([place])   . Diabetic neuropathy ([place])   . ESRD (end stage renal disease) on dialysis ([place])   . GERD (gastroesophageal reflux disease)   . NSVT (nonsustained ventricular tachycardia) ([place]) 10/20201  . OSA on CPAP   . Ulnar neuropathy    BUE  . Vitamin B 12 deficiency      Family History  Problem Relation Age of Onset  . Diabetes Mother   . Heart disease Mother   . Diabetes Father   . Heart disease Father   . Diabetes Sister     Social History:  reports that she has never smoked. She has never used smokeless tobacco. She reports previous alcohol use. She reports previous drug use.   Prior to Admission medications   Medication Sig Start Date End Date Taking? Authorizing Provider  amitriptyline (ELAVIL) 25 MG tablet Take  25 mg by mouth at bedtime. 06/16/20  Yes [provider]  amLODipine (NORVASC) 10 MG tablet Take 10 mg by mouth daily. 06/16/20  Yes [provider]  atorvastatin (LIPITOR) 80 MG tablet Take 80 mg by mouth every evening. 04/06/19  Yes [provider]  celecoxib (CELEBREX) 100 MG capsule Take 100 mg by mouth daily. 06/16/20  Yes [provider]  cholecalciferol (VITAMIN D3) 25 MCG (1000 UNIT) tablet Take 1,000 Units by mouth daily.   Yes [provider]  cinacalcet (SENSIPAR) 60 MG tablet Take 60 mg by mouth daily.    Yes [provider]  gabapentin (NEURONTIN) 300 MG capsule Take 300 mg by mouth daily. 04/30/14  Yes [provider]  HUMALOG 100 UNIT/ML injection Inject 3-13 Units into the skin 3 (three) times daily before meals. Per sliding scale 06/25/20  Yes [provider]  insulin glargine (LANTUS) 100 UNIT/ML Solostar Pen Inject 80 Units into the skin at bedtime. 02/22/18  Yes [provider]  losartan (COZAAR) 50 MG tablet Take 50 mg by mouth daily. 06/16/20  Yes [provider]  sevelamer carbonate (RENVELA) 800 MG tablet Take 800 mg by mouth 3 (three) times daily. 06/16/20  Yes [provider]  SYNTHROID 75 MCG tablet Take 75 mcg by mouth daily. 06/16/20  Yes [provider]  torsemide (DEMADEX) 20 MG tablet Take 20 mg by mouth daily. 06/16/20  Yes [provider]  vitamin B-12 (CYANOCOBALAMIN) 1000 MCG tablet Take 2,000 mcg by mouth daily.   Yes [provider]  vitamin E 180 MG (400 UNITS) capsule Take 400 Units by mouth daily.   Yes [provider]    Exam: Current vital signs: BP (!) 119/54 (BP Location: Right Arm)   Pulse (!) 50   Temp 97.9 F (36.6 C) (Oral)   Resp 11   Ht '5\' 8"'$  (1.727 m)   Wt 110.5 kg   SpO2 96%   BMI 37.04 kg/m   Physical Exam  Constitutional: Appears well-developed and well-nourished.  Psych: Affect appropriate to situation Eyes: No scleral injection HENT: No OP obstrucion Head: Normocephalic.  Cardiovascular: Normal rate and regular rhythm.  Respiratory: Effort normal and breath sounds normal to anterior ascultation GI: Soft.  No distension. There is no tenderness.  Skin: WDI  Neuro: Mental Status in HD:  Patient is unresponsive except to pain where she would withdraw and start being irritated and moving her arms. No speech. Not following commands. Would not say her name or respond verbally at all.  Speech: mute Cranial Nerves: II: Pupils are equal, round, and reactive to  light.  III,IV, VI:no ptosis  V, VII: no obvious facial droop.   VIII: patient withdraws to pain.  Motor: Tone is normal. Bulk is increased as patient is obese. Can not test strength due to not following commands.  Sensory: Withdraws to pain.   Plantars: Toes are downgoing bilaterally.    I have reviewed labs in epic and the pertinent results are: Ammonia 9, TSH 13.148, ABG is WDL, BMP and LA pending.    MD reviewed the images obtained:  NCT head original read: 1.9 x 1.3 cm acute parenchymal hemorrhage within the paramedian left occipital lobe at site of a known subacute left PCA territory infarct. Petechial hemorrhage within the left basal ganglia, left frontoparietal operculum, left insula and left temporal lobe at site of a known subacute left MCA territory infarct. Subtle mass effect with partial effacement of the left lateral ventricle. No  midline shift.  These results were called by telephone at the time of interpretation on 07/23/2020 at 4:55 pm to provider Dr. Kathrynn Speed, who verbally acknowledged these results. However, after discussion with Dr. Leonel Ramsay, an addendum of [facility] was written: "As discussed at this time, petechial hemorrhage was present within the medial left occipitallobe on the prior brain MRI of 07/12/2020."  CTA head-patent arteries in area of thrombectomy.   Assessment: 60 yo female who had a stroke on 07/11/20 and underwent thrombectomy. After about a 7 day stay in hospital, she was transferred to CIR. It seems per notes, that patient would nod her head and mumble at baseline. While in HD today, patient became acutely unresponsive. ABG showed no hypercarbia, CT/CTA head as above. TSH is 13.148, ammonia normal. LA, BMP pending.  CT head as above thought there was an acute hemorrhage, but addendum to first read, the hemorrhage was also found on MRI on 07/12/20.    At first, consideration was given to transferring patient back to the hospital.  However, she returned to her baseline by the time we reached rehab. Eliquis was d/c'd and CT head ordered for a.m. As far as neurology concerned, patient could remain on rehab floor.    Impression: Decreased level of consciousness of unknown etiology.  Recommendations: -CTH without contrast in am-ordered -EEG stat-ordered -Eliquis d/c'd -follow additional labs.  -her TSH is very high and will defer treatment to medicine team.    Electronically signed by: Rachael Boll, MSN, APN-BC, nurse practitioner and by MD. Note/plan to be edited by MD as needed.   I responded to the acute consult with Rachael Herrera and was present during the evaluation/work-up reflected in the note above.  Assessment and plan were jointly formulated.   She appeared to fall asleep during dialysis, but then was difficult to wake up.  On my assessment, she did have increased lethargy, but no definite change in her actual neurological exam compared to previous notes.  She was taken for an emergent head CT which demonstrates evolution of hemorrhagic transformation which was previously seen on the MRI.  Though the collection is more confluent now, I do not think this truly represents new findings.  I do think it would be reasonable to repeat a head CT in the morning and hold Eliquis in the interim.  Glucose was normal, blood pressure did not become significantly hypotensive during dialysis.  Her mental status appeared to improve.  Without any other clear etiology, I think possibilities include seizure with postictal state and therefore we will monitor with EEG for now.  Also possible would be transient disequilibrium due to dialysis.  I think the findings on CT are unlikely to be directly related to her changes this afternoon.  We will also work-up other metabolic causes such as hyperammonemia.  Neurology will continue to follow.  Roland Rack, MD [facility] [phone]  If 7pm- 7am, please  page neurology on call as listed in [place].

## 2020-07-23 NOTE — Progress Notes (Signed)
Occupational Therapy Note  Patient Details  Name: Rachael Herrera MRN: [id number] Date of Birth: 02-07-1961  Today's Date: 07/23/2020 OT Missed Time: 37 Minutes Missed Time Reason: Patient unwilling/refused to participate without medical reason  Patient greeted awake sitting up in bed watching TV. OT tried to engage pt in functional task and encouraged her to get OOB. Pt shaking head and saying no to participation with OT. OT educated on importance of getting OOB and doing things for herself in order to go home. Pt continued to adamently refuse. OT tried to provide tactile cues to bring pt's LEs to EOB, but she moved feet back and continued saying no. Pt left semi-reclined in bed with bed alarm on, call bell in reach and needs met.   Daneen Schick Tahara Ruffini 07/23/2020, 7:58 AM

## 2020-07-23 NOTE — Progress Notes (Signed)
Patient has been transported to another room.  EEG machine retrieved successfully and transported to the patient rm in 6w.  Machine is operating on wifi, [facility] is montoriing and the event button has been tested/verified as being seen with/by [facility]

## 2020-07-23 NOTE — Progress Notes (Signed)
EEG complete - results pending 

## 2020-07-23 NOTE — Plan of Care (Signed)
  Problem: Consults Goal: RH STROKE PATIENT EDUCATION Description: See Patient Education module for education specifics  Outcome: Progressing Goal: Diabetes Guidelines if Diabetic/Glucose > 140 Description: If diabetic or lab glucose is > 140 mg/dl - Initiate Diabetes/Hyperglycemia Guidelines & Document Interventions  Outcome: Progressing   Problem: RH SKIN INTEGRITY Goal: RH STG SKIN FREE OF INFECTION/BREAKDOWN Description: Skin to remain free from infection and breakdown with supervision assistance. Outcome: Progressing   Problem: RH SAFETY Goal: RH STG ADHERE TO SAFETY PRECAUTIONS W/ASSISTANCE/DEVICE Description: STG Adhere to Safety Precautions With supervision Assistance/Device. Outcome: Progressing   Problem: RH PAIN MANAGEMENT Goal: RH STG PAIN MANAGED AT OR BELOW PT'S PAIN GOAL Description: <3 on a 0-10 pain scale. Outcome: Progressing

## 2020-07-23 NOTE — Progress Notes (Signed)
Physical Therapy Session Note  Patient Details  Name: DAKSHA MACATANGAY MRN: [id number] Date of Birth: 02-25-1961  Today's Date: 07/23/2020 PT Missed Time: 2 Minutes Missed Time Reason: Patient unwilling to participate  Short Term Goals: Week 1:  PT Short Term Goal 1 (Week 1): Pt will perform bed mobility with CGA. PT Short Term Goal 2 (Week 1): Pt will perform bed to chair wtih CGA. PT Short Term Goal 3 (Week 1): Pt will ambulate 150' with minA. PT Short Term Goal 4 (Week 1): PT will perform 8 steps with BHRs and minA.  Skilled Therapeutic Interventions/Progress Updates:     Pt sitting in recliner upon PT arrival. PT asks if pt is "ready for PT" and pt nods head. PT then cues pt to transfer to [facility] and pt shakes head and indicates that she does not want to get up. PT spends >10 minutes attemping to encourage and educate pt on benefits of participation. Pt continues to refuse and shake head. PT then asks if pt is feeling depressed and she nods head "yes". PT will follow up as appropriate.  Therapy Documentation Precautions:  Precautions Precautions: Fall Precaution Comments: R inattention Restrictions Weight Bearing Restrictions: No General: PT Amount of Missed Time (min): 60 Minutes PT Missed Treatment Reason: Patient unwilling to participate   Therapy/Group: Individual Therapy  Breck Coons, PT, DPT 07/23/2020, 12:53 PM

## 2020-07-23 NOTE — Procedures (Signed)
Patient Name: Rachael Herrera  MRN: [id number]  Epilepsy Attending: Lora Havens  Referring Physician/Provider: Clance Boll Date: 07/23/2020 Duration: 24.20 mins  Patient history:  60 yo F with Left PCA infarct and overlying hemorrhage noted to have transient unresponsive episode of unknown etiology. EEG to evaluate for seizure  Level of alertness: Awake  AEDs during EEG study: None  Technical aspects: This EEG study was done with scalp electrodes positioned according to the 10-20 International system of electrode placement. Electrical activity was acquired at a sampling rate of '500Hz'$  and reviewed with a high frequency filter of '70Hz'$  and a low frequency filter of '1Hz'$ . EEG data were recorded continuously and digitally stored.   Description: The posterior dominant rhythm consists of 9 Hz activity of moderate voltage (25-35 uV) seen predominantly in posterior head regions, symmetric and reactive to eye opening and eye closing. EEG showed ntermittent 3 to 6 Hz theta-delta slowing in left temporal region. Hyperventilation and photic stimulation were not performed.     ABNORMALITY -Intermittent slow, left temporal region.   IMPRESSION: This study is suggestive of nonspecific left temporal dysfunction. No seizures or epileptiform discharges were seen throughout the recording.   Rachael Herrera

## 2020-07-23 NOTE — Progress Notes (Addendum)
Hemodialysis- Treatment stopped. When checking on patient found mostly unresponsive. All vitals wnl. CBG 107. Sats 96% on room air. Placed on 2L [place]. Placed on telemetry. Brady 50s within baseline. BP 119/54. No drops during treatment. Patient will somewhat respond to pain. Blinking. Not tracking.  Treatment stopped with 13 minutes remaining due to unresponsiveness. Rapid response called. Neuro at bedside. Labs drawn as ordered. Patient left unit with Rapid Response team for CT. Report called to primary RN on 4W.

## 2020-07-23 NOTE — Progress Notes (Signed)
Speech Language Pathology Daily Session Note  Patient Details  Name: HIMANI WHITTY MRN: [id number] Date of Birth: 17-Oct-1960  Today's Date: 07/23/2020 SLP Individual Time: [id number] SLP Individual Time Calculation (min): 55 min  Short Term Goals: Week 1: SLP Short Term Goal 1 (Week 1): Pt improve verbal fluency with automatic language as well as using melodic intonation strategies for simple phrases with 40% accuracy with max A multimodal cues. SLP Short Term Goal 2 (Week 1): Pt respond to yes/no questions pertaining to biographical information/immediate environment  and wants/needs with 60% accuracy with max A multimodal cues. SLP Short Term Goal 3 (Week 1): Pt will follow 1 step commands with 50% accuracy with max A multimodal cues. SLP Short Term Goal 4 (Week 1): Pt will match word to object in a field of 2 with 90% accuracy with min A semantic cues. SLP Short Term Goal 5 (Week 1): Pt will clear right buccal pocketing with max A verbal cues of dys 2 textures. SLP Short Term Goal 6 (Week 1): Pt will demonstrate awareness of verbal and functional errors with max A multimodal cues in 10% of opportunities.  Skilled Therapeutic Interventions: Skilled treatment session focused on cognitive and communication goals. Upon arrival, patient was sitting EOB and had spilled her Nepro drink on the floor with bilateral feet resting in the puddle without awareness. SLP provided total A for clean up and to donn new socks. When SLP left the room to remove the tray, patient stood up without assistance in attempts to go to the sink. Patient required Mod A verbal cues for problem solving during basic self-care tasks like brushing her teeth. Patient declined to sit up in the wheelchair with the belt alarm on and wanted to sit EOB. SLP eventually convinced patient to sit in recliner with chair pad on.  Throughout session, patient was minimally responsive to SLP's questions but did intermittently vocalize spontaneous  responses like "I don't know," etc. Patient did however say "bye baby" at end of session. Due to patient's decreased safety awareness and intermittent refusal for safety procedures, recommend patient's room is moved closer to RN station. RN and charge RN made aware. Continue with current plan of care.      Pain No/Denies Pain   Therapy/Group: Individual Therapy  Odester Nilson 07/23/2020, 10:24 AM

## 2020-07-23 NOTE — Consult Note (Signed)
Cardiology Consultation:   Patient ID: Rachael Herrera MRN: [id number]; DOB: 1960/10/09  Admit date: 07/18/2020 Date of Consult: 07/23/2020  PCP:  Patient, No Pcp Per   [place]  Cardiologist:  No primary care provider on file.  None locally.  Dr. Mahala Herrera at [place] Provider:  No care team member to display Electrophysiologist:  None  EP - Advanced Practice Provider: Baldwin Herrera :Rachael Herrera [id number]    Patient Profile:   Rachael Herrera is a 60 y.o. female with a hx of ESRD and CVA who is being seen today for the evaluation of bradycardia at the request of Nephrology and [place] service.  Admitted with acute stroke 07/11/2020. HAS ESRF on HD (notes discuss tend to have hypotension with HD), HTN, HLD, DM, obesity, OSA w/CPAP, hypothyroidism, PVD with reported stable claudication b/l, she follows with cardiology service at [facility] for hx of NSVT/PVCs, and bradycardia, fe.t to have tachy-brady with a recent aborted PPM implant, planned for MICRA, was seen by [facility] EP 2018/2022 and felt in atrial flutter. No active therapy recommended. Cardiology is re-consulted about bardycardia.  History of Present Illness:   [name] the patient has been hospitalized and are on the rehab medicine floor since 07/11/2020 when she developed acute stroke.  She had electrophysiology consultation on 07/12/2020 and was felt to be in atrial flutter with controlled ventricular rate.  No recommendations were made concerning pacemaker active therapy.  Today if she became unresponsive during dialysis.  She is now back on the rehab floor after having a head CT.  She is more alert and responsive.  Apparently nothing was found on the head CT.  Cardiology is asked to see again for reasons that are unclear but possibly related to bradycardia and whether or not this had any role in the episode that she had on dialysis.  There is no correlating data  concerning heart rate during obtundation.  No vital signs are helpful vision from the event other than the very brief nursing notes, and neurology note, and an unrelated nephrology note.  In the process of being transferred to a telemetry bed.  No current EKG or rhythm strip data to evaluate.   Past Medical History:  Diagnosis Date  . Cavitary pneumonia 06/2017   due to MSSA  . Claudication of both lower extremities ([place])   . DDD (degenerative disc disease), cervical   . DDD (degenerative disc disease), lumbosacral   . Diabetes mellitus ([place])   . Diabetic neuropathy ([place])   . ESRD (end stage renal disease) on dialysis ([place])   . GERD (gastroesophageal reflux disease)   . NSVT (nonsustained ventricular tachycardia) ([place]) 10/20201  . OSA on CPAP   . Ulnar neuropathy    BUE  . Vitamin B 12 deficiency     Past Surgical History:  Procedure Laterality Date  . IR ANGIO VERTEBRAL SEL VERTEBRAL UNI L MOD SED  07/11/2020  . IR CT HEAD LTD  07/11/2020  . IR PERCUTANEOUS ART THROMBECTOMY/INFUSION INTRACRANIAL INC DIAG ANGIO  07/11/2020  . IR US GUIDE VASC ACCESS LEFT  07/12/2020  . RADIOLOGY WITH ANESTHESIA N/A 07/11/2020   Procedure: IR WITH ANESTHESIA;  Surgeon: Radiologist, Medication, MD;  Location: [place];  Service: Radiology;  Laterality: N/A;     Home Medications:  Prior to Admission medications   Medication Sig Start Date End Date Taking? Authorizing Provider  amitriptyline (ELAVIL) 25 MG tablet Take 25 mg by mouth at  bedtime. 06/16/20  Yes [provider]  amLODipine (NORVASC) 10 MG tablet Take 10 mg by mouth daily. 06/16/20  Yes [provider]  atorvastatin (LIPITOR) 80 MG tablet Take 80 mg by mouth every evening. 04/06/19  Yes [provider]  celecoxib (CELEBREX) 100 MG capsule Take 100 mg by mouth daily. 06/16/20  Yes [provider]  cholecalciferol (VITAMIN D3) 25 MCG (1000 UNIT) tablet Take 1,000 Units by mouth daily.   Yes [provider]  cinacalcet (SENSIPAR) 60 MG tablet Take 60 mg by mouth daily.   Yes [provider]  gabapentin (NEURONTIN) 300 MG capsule Take 300 mg by mouth daily. 04/30/14  Yes [provider]  HUMALOG 100 UNIT/ML injection Inject 3-13 Units into the skin 3 (three) times daily before meals. Per sliding scale 06/25/20  Yes [provider]  insulin glargine (LANTUS) 100 UNIT/ML Solostar Pen Inject 80 Units into the skin at bedtime. 02/22/18  Yes [provider]  losartan (COZAAR) 50 MG tablet Take 50 mg by mouth daily. 06/16/20  Yes [provider]  sevelamer carbonate (RENVELA) 800 MG tablet Take 800 mg by mouth 3 (three) times daily. 06/16/20  Yes [provider]  SYNTHROID 75 MCG tablet Take 75 mcg by mouth daily. 06/16/20  Yes [provider]  torsemide (DEMADEX) 20 MG tablet Take 20 mg by mouth daily. 06/16/20  Yes [provider]  vitamin B-12 (CYANOCOBALAMIN) 1000 MCG tablet Take 2,000 mcg by mouth daily.   Yes [provider]  vitamin E 180 MG (400 UNITS) capsule Take 400 Units by mouth daily.   Yes [provider]    Inpatient Medications: Scheduled Meds: . amitriptyline  25 mg Oral QHS  . amLODipine  10 mg Oral Daily  . atorvastatin  80 mg Oral Daily  . Chlorhexidine Gluconate Cloth  6 each Topical Q0600  . cinacalcet  60 mg Oral Q breakfast  . feeding supplement (NEPRO CARB STEADY)  237 mL Oral TID BM  . insulin aspart  0-5 Units Subcutaneous QHS  . insulin aspart  0-9 Units Subcutaneous TID WC  . insulin glargine  20 Units Subcutaneous BID  . levothyroxine  75 mcg Oral QAC breakfast  . losartan  100 mg Oral Daily  . mouth rinse  15 mL Mouth Rinse BID  . multivitamin  1 tablet Oral QHS  . nystatin  5 mL Oral QID  . sevelamer carbonate  800 mg Oral TID WC   Continuous Infusions:  PRN Meds: acetaminophen, bisacodyl, diphenhydrAMINE, guaiFENesin-dextromethorphan, milk and molasses, polyethylene glycol,  prochlorperazine **OR** prochlorperazine **OR** prochlorperazine, senna-docusate, traZODone  Allergies:   No Known Allergies  Social History:   Social History   Socioeconomic History  . Marital status: Single    Spouse name: Not on file  . Number of children: Not on file  . Years of education: Not on file  . Highest education level: Not on file  Occupational History  . Not on file  Tobacco Use  . Smoking status: Never Smoker  . Smokeless tobacco: Never Used  Substance and Sexual Activity  . Alcohol use: Not Currently  . Drug use: Not Currently  . Sexual activity: Not Currently  Other Topics Concern  . Not on file  Social History Narrative  . Not on file   Social Determinants of Health   Financial Resource Strain: Not on file  Food Insecurity: Not on file  Transportation Needs: Not on file  Physical Activity: Not on file  Stress: Not on file  Social Connections: Not on file  Intimate Partner Violence: Not on file    Family History:    Family History  Problem Relation Age of Onset  . Diabetes Mother   . Heart disease Mother   . Diabetes Father   . Heart disease Father   . Diabetes Sister      ROS:  Please see the history of present illness.  Patient unable to communicate. All other ROS reviewed and negative.     Physical Exam/Data:   Vitals:   07/23/20 1541 07/23/20 1615 07/23/20 1700 07/23/20 1712  BP: (!) 119/54 (!) 135/55 (!) 145/55 (!) 141/102  Pulse: (!) 50 (!) 54 (!) 54 (!) 55  Resp: '11 14 16 20  '$ Temp: 97.9 F (36.6 C)   97.7 F (36.5 C)  TempSrc: Oral   Oral  SpO2: 96% 92%  91%  Weight: 110.5 kg     Height:        Intake/Output Summary (Last 24 hours) at 07/23/2020 1803 Last data filed at 07/23/2020 1541 Gross per 24 hour  Intake 100 ml  Output 1350 ml  Net -1250 ml   Last 3 Weights 07/23/2020 07/23/2020 07/22/2020  Weight (lbs) 243 lb 9.7 oz 246 lb 11.1 oz 247 lb 5.7 oz  Weight (kg) 110.5 kg 111.9 kg 112.2 kg     Body mass index is 37.04  kg/m.  General: Obese, seems somewhat agitated and upset with her daughter, having difficulty with spontaneous intelligible speech. HEENT: Extraocular movements were full Lymph: Not examined Neck: Difficult to evaluate Endocrine: No obvious goiter Vascular: No carotid bruits; FA pulses 2+ bilaterally without bruits  Cardiac: Bradycardic irregular rhythm with heart rate around 50 bpm. Lungs:  clear to auscultation anterior bilateral. Abd: Becomes upset when abdomen is palpated Ext: no edema Musculoskeletal:  No deformities, BUE and BLE strength normal and equal Skin: warm and dry  Neuro: Aphasia/dysarthria Psych: Agitated  EKG:  The EKG was from 07/12/2020 and 07/15/2020 were personally reviewed and demonstrates: The 07/12/2020 EKG demonstrates atrial flutter with variable AV conduction and average heart rate of 84 bpm.  Poor R wave progression V1 through V3.  EKG from 07/15/2020 demonstrated junctional rhythm with occasional sinus beat at a heart rate in the 60s.  Extensive QS pattern V1 through V6 Telemetry:  Telemetry was personally reviewed and demonstrates: No data yet available  Relevant CV Studies: EKG from 07/12/2020  Laboratory Data:  High Sensitivity Troponin:  No results for input(s): TROPONINIHS in the last 720 hours.   Chemistry Recent Labs  Lab 07/22/20 0453 07/23/20 1300 07/23/20 1723  NA 135 133* 135  K 4.0 4.4 4.2  CL 93* 93* 93*  CO2 25 20* 26  GLUCOSE 112* 268* 136*  BUN 44* 61* 31*  CREATININE 9.88* 12.72* 8.25*  CALCIUM 9.2 8.9 9.0  GFRNONAA 4* 3* 5*  ANIONGAP 17* 20* 16*    Recent Labs  Lab 07/19/20 0505 07/21/20 0528 07/22/20 0453 07/23/20 1300  PROT 7.0  --   --   --   ALBUMIN 3.3* 3.5 3.6 3.6  AST 17  --   --   --   ALT 16  --   --   --   ALKPHOS 76  --   --   --   BILITOT 0.8  --   --   --    Hematology Recent Labs  Lab 07/19/20 0505 07/22/20 0453 07/23/20 1300  WBC 6.4 7.8 8.2  RBC 3.72*  4.09 3.88  HGB 11.3* 12.8 11.7*  HCT 35.9*  38.6 37.1  MCV 96.5 94.4 95.6  MCH 30.4 31.3 30.2  MCHC 31.5 33.2 31.5  RDW 15.1 14.7 14.8  PLT 216 241 241   BNPNo results for input(s): BNP, PROBNP in the last 168 hours.  DDimer No results for input(s): DDIMER in the last 168 hours.   Radiology/Studies:  CT ANGIO HEAD W OR WO CONTRAST  Result Date: 07/23/2020 CLINICAL DATA:  Prior stroke, bleed on CT head EXAM: CT ANGIOGRAPHY HEAD TECHNIQUE: Multidetector CT imaging of the head was performed using the standard protocol during bolus administration of intravenous contrast. Multiplanar CT image reconstructions and MIPs were obtained to evaluate the vascular anatomy. CONTRAST:  66m OMNIPAQUE IOHEXOL 350 MG/ML SOLN COMPARISON:  Correlation made with prior CT and MR imaging FINDINGS: CTA HEAD Anterior circulation: Intracranial internal carotid arteries are patent with calcified plaque causing mild stenosis. Recanalization of prior thrombus with now patent left middle cerebral artery. Improved flow within the distal left MCA territory. Right middle cerebral artery and both anterior cerebral arteries are patent. Posterior circulation: Intracranial vertebral, basilar, and posterior cerebral arteries are patent. Left posterior communicating artery is present. Venous sinuses: Not well evaluated. Review of the MIP images confirms the above findings IMPRESSION: Left MCA is now patent.  No new abnormality. Electronically Signed   By: PMacy MisM.D.   On: 07/23/2020 17:18   CT HEAD WO CONTRAST  Addendum Date: 07/23/2020   ADDENDUM REPORT: 07/23/2020 17:06 ADDENDUM: These results were called by telephone at the time of interpretation on 07/23/2020 at 4:55 pm to provider Dr. MKathrynn Speed who verbally acknowledged these results. As discussed at this time, petechial hemorrhage was present within the medial left occipital lobe on the prior brain MRI of 07/12/2020. Electronically Signed   By: KKellie SimmeringDO   On: 07/23/2020 17:06   Result Date:  07/23/2020 CLINICAL DATA:  Mental status change, unknown cause. EXAM: CT HEAD WITHOUT CONTRAST TECHNIQUE: Contiguous axial images were obtained from the base of the skull through the vertex without intravenous contrast. COMPARISON:  Brain MRI 07/12/2020. Report from thrombectomy 07/11/2020. CT angiogram head/neck 07/11/2020. FINDINGS: Brain: Mild cerebral and cerebellar atrophy. New from prior exams, there is a 1.9 x 1.3 cm focus of acute parenchymal hemorrhage within the paramedian left occipital lobe at site of a known subacute infarct left PCA territory. Petechial hemorrhage is present within the left caudate and lentiform nuclei and within the cortex of the left frontotemporal operculum, left insula and left temporal lobe at site of a known subacute left MCA territory infarct. Minimal mass effect with subtle partial effacement of the left lateral ventricle. No midline shift. No extra-axial fluid collection. No evidence of intracranial mass. Vascular: No hyperdense vessel.  Atherosclerotic calcifications. Skull: Normal. Negative for fracture or focal lesion. Sinuses/Orbits: Rightward gaze. Moderate partial opacification of right ethmoid air cells posteriorly. Minimal partial opacification of left ethmoid air cells. IMPRESSION: 1.9 x 1.3 cm acute parenchymal hemorrhage within the paramedian left occipital lobe at site of a known subacute left PCA territory infarct. Petechial hemorrhage within the left basal ganglia, left frontoparietal operculum, left insula and left temporal lobe at site of a known subacute left MCA territory infarct. Subtle mass effect with partial effacement of the left lateral ventricle. No midline shift. Electronically Signed: By: KKellie SimmeringDO On: 07/23/2020 16:45     Assessment and Plan:   1. Episode of diminished consciousness: Etiology is uncertain.  Based upon  the meager available data, unable to associate with a particular cardiac problem.  Documented vital signs by rapid  response was blood pressure 119/54, heart rate 50, respirations 11, and O2 saturation 96%. 2. History of bradycardia followed by electrophysiology at [place] on telemetry to see if there is any actionable heart rhythm disturbance.  Patient now seems back to baseline.  RECOMMENDATIONS:  1. Per neurology and treating primary team 2. Telemetry to determine if any significant arrhythmia.  No other specific cardiovascular recommendations at this time.   Risk Assessment/Risk Scores:            Signed, Sinclair Grooms, MD  07/23/2020 6:03 PM

## 2020-07-23 NOTE — Progress Notes (Signed)
LTM EEG hooked up and running - no initial skin breakdown - push button tested - neuro notified.  

## 2020-07-24 ENCOUNTER — Encounter (HOSPITAL_COMMUNITY): Payer: Self-pay | Admitting: Internal Medicine

## 2020-07-24 ENCOUNTER — Inpatient Hospital Stay (HOSPITAL_COMMUNITY): Payer: Medicare Other

## 2020-07-24 ENCOUNTER — Other Ambulatory Visit: Payer: Self-pay

## 2020-07-24 DIAGNOSIS — I639 Cerebral infarction, unspecified: Secondary | ICD-10-CM

## 2020-07-24 DIAGNOSIS — R404 Transient alteration of awareness: Secondary | ICD-10-CM

## 2020-07-24 DIAGNOSIS — R001 Bradycardia, unspecified: Secondary | ICD-10-CM

## 2020-07-24 LAB — GLUCOSE, CAPILLARY
Glucose-Capillary: 104 mg/dL — ABNORMAL HIGH (ref 70–99)
Glucose-Capillary: 123 mg/dL — ABNORMAL HIGH (ref 70–99)
Glucose-Capillary: 407 mg/dL — ABNORMAL HIGH (ref 70–99)
Glucose-Capillary: 268 mg/dL — ABNORMAL HIGH (ref 70–99)

## 2020-07-24 LAB — LIPID PANEL
Cholesterol: 117 mg/dL (ref 0–200)
HDL: 36 mg/dL — ABNORMAL LOW (ref >40)
LDL Cholesterol: 37 mg/dL (ref 0–99)
Total CHOL/HDL Ratio: 3.3 RATIO
Triglycerides: 222 mg/dL — ABNORMAL HIGH (ref <150)
VLDL: 44 mg/dL — ABNORMAL HIGH (ref 0–40)

## 2020-07-24 LAB — CBC
HCT: 37.8 % (ref 36.0–46.0)
Hemoglobin: 12.3 g/dL (ref 12.0–15.0)
MCH: 31.1 pg (ref 26.0–34.0)
MCHC: 32.5 g/dL (ref 30.0–36.0)
MCV: 95.5 fL (ref 80.0–100.0)
Platelets: 272 10*3/uL (ref 150–400)
RBC: 3.96 MIL/uL (ref 3.87–5.11)
RDW: 14.9 % (ref 11.5–15.5)
WBC: 6.8 10*3/uL (ref 4.0–10.5)
nRBC: 0 % (ref 0.0–0.2)

## 2020-07-24 LAB — HEMOGLOBIN A1C
Hgb A1c MFr Bld: 8.8 % — ABNORMAL HIGH (ref 4.8–5.6)
Mean Plasma Glucose: 205.86 mg/dL

## 2020-07-24 LAB — BASIC METABOLIC PANEL
Anion gap: 14 (ref 5–15)
BUN: 32 mg/dL — ABNORMAL HIGH (ref 8–23)
CO2: 27 mmol/L (ref 22–32)
Calcium: 9.1 mg/dL (ref 8.9–10.3)
Chloride: 96 mmol/L — ABNORMAL LOW (ref 98–111)
Creatinine, Ser: 9.11 mg/dL — ABNORMAL HIGH (ref 0.44–1.00)
GFR, Estimated: 5 mL/min — ABNORMAL LOW (ref >60)
Glucose, Bld: 89 mg/dL (ref 70–99)
Potassium: 4.1 mmol/L (ref 3.5–5.1)
Sodium: 137 mmol/L (ref 135–145)

## 2020-07-24 MED ORDER — LEVOTHYROXINE SODIUM 100 MCG/5ML IV SOLN
50.0000 ug | Freq: Every day | INTRAVENOUS | Status: DC
  Administered 2020-07-24: 50 ug via INTRAVENOUS
  Filled 2020-07-24 (×2): qty 5

## 2020-07-24 MED ORDER — INSULIN GLARGINE 100 UNIT/ML ~~LOC~~ SOLN
15.0000 [IU] | Freq: Two times a day (BID) | SUBCUTANEOUS | Status: DC
  Filled 2020-07-24: qty 0.15

## 2020-07-24 MED ORDER — INSULIN GLARGINE 100 UNIT/ML ~~LOC~~ SOLN
24.0000 [IU] | Freq: Two times a day (BID) | SUBCUTANEOUS | Status: DC
  Administered 2020-07-24 – 2020-07-25 (×2): 24 [IU] via SUBCUTANEOUS
  Filled 2020-07-24 (×3): qty 0.24

## 2020-07-24 NOTE — Progress Notes (Signed)
Attempted to do swallow screen evaluation and patient was very, very sleepy and hard to arouse.  Rapid response was called to further evaluate the patient.  Patient is responsive to pain.  Will continue to monitor.       Rico Sheehan, RN

## 2020-07-24 NOTE — Progress Notes (Signed)
vLTM EEG complete. No skin breakdown 

## 2020-07-24 NOTE — Consult Note (Addendum)
Cardiology Consultation:   Patient ID: Rachael Herrera MRN: [id number]; DOB: March 19, 1961  Admit date: 07/23/2020 Date of Consult: 07/24/2020  PCP:  Patient, No Pcp Per   [facility]  Cardiologist/Electrophysiologist:  Rachael Herrera :[id number]    Patient Profile:   Rachael Herrera is a 60 y.o. female with a hx of ESRF on HD (notes discuss tend to have hypotension with HD), HTN, HLD, DM, obesity, OSA w/CPAP, hypothyroidism, PVD with reported stable claudication b/l, she follows with cardiology service at [facility] for hx of NSVT/PVCs, and bradycardia, felt to have tachy-brady with a recent aborted PPM implant Nov 2021 planned for [facility] down the road (had been scheduled for 07/22/20) who is being seen today for the evaluation of bradycardia at the request of Rachael Herrera.  History of Present Illness:   Rachael Herrera follows with Rachael Herrera for cardiology/EP. Last last year  was admitted to [facility] 03/28/20 for pacemaker implant but was unsuccessful, with plans is to bring her back to the hospital in 3 weeks or so for [facility] pacemaker placement.  Rachael Herrera procedure note 03/28/20 states: Procedure(s):  Attempt was made to implant dual-chamber pacemaker from right axillary or subclavian vein since patient had fistula for dialysis left arm, however right septic axillary or subclavian vein was not accessible to cannulation. We did under ultrasound guidance, and also contrast with Seldinger technique, a cutdown was not successful we consulted vascular surgeon they attempted but however was not able to access to axillary or subclavian vein. We canceled the case and we are going to bring her back proceed with Micra leadless pacemaker from right femoral vein.  She was hospitalized by notees 07/09/20 in the ED at [facility] for another episode that occurred during dialysis. She became bradycardic and went "blind in the left eye." A CT head was suggested but she left  without this test. She had bradycardia and dizziness at the time, perhaps precipitating the spell. Plans were initiated to have her pacemaker replaced. The note from that visit states, "Rachael Herrera was consulted and stated that patient was very symptomatic when she got bradycardic this morning a.m. wanted [place] to make sure to possibly get a CT of her head and admit her to the hospital and let Rachael Herrera See her and decide if he wants to put in a pacemaker now. Rachael Herrera agreed to this plan but the patient stated that she is not going to stay in the hospital, and she Rachael Herrera call Rachael Herrera office and make arrangements for him to redo her pacemaker, and refused to stay in the hospital or complete CT with resolution of her symptoms.  THEN Admitted to [facility] 07/11/20 with stroke found in new AFlutter. EP was consulted given arrhythmia and bradycardia history. She was seen by nyself and Rachael Herrera. She was in rate controlled AFlutter with some brief periods of SR, no post termination pauses noted or significant bradycardia noting some junctional beats in SR, though not slow. Recommended initiation of anticoagulation when neurology felt safe and no urgent need for pacing to f/u with Rachael Herrera outpatient for leadless device planned.  She was discharged to rehab 07/18/20  Yesterday while at dialysis became lethargic, un arousable As best that I can tel, BP remained stable through her session, which was completed/finishing when RN noted change in Rachael Herrera "Treatment stopped. When checking on patient found mostly unresponsive. All vitals wnl. CBG 107. Sats 96% on room air. Placed on 2L [place].  Placed on telemetry. Brady 50s within baseline. BP 119/54. No drops during treatment" Code stroke was called, imaging initially via CT where it was thought she had a posterior bleed, but Rachael Herrera thought this was old. At CTA head was done which showed patent arteries from the thrombectomy with no hemorrhage There was some  thoughts towards seizure/post ictal state and EEG ordered She had return to baseline and neurology felt could stay in rehab though readmitted by Rachael Herrera for cardiology consult it seems. Her eliquis stopped with plans to repeat imaging  Cardiology was consulted, not felt to have HR correlating data to her event, at the time of his eval, no tracings or new EKG was available EP was asked to r evisit the case.  LABS TSH is quite high 13.148 PH 7.439 PCO2 39.5 PO2 88.1 HCO3 26.3 96% K+ 4.4 > 4.2 > 4.1 Mag 2.2 BUN/Creat  61/12.72 > 31/8.25 > 32/9.11 HS Trop 21 > 22 Lactic acid 1.9 Ammonia <9 WBC 6.8 H/H 12/37 Plts 272  Neurology consult yesterday notes: 07/18/20 on d/c note she was plegic on the right with some withdrawal to pain. She was awake, alert, mute and not following commands on discharge. Last rehab note states that patient was awake and irritated. Mumbling words that were unintelligible. Nodded yes/no.   BPs stable HR 40's-50's Patient opened eyes to verbal briefly, nothing otherwise  Past Medical History:  Diagnosis Date  . Cavitary pneumonia 06/2017   due to MSSA  . Claudication of both lower extremities ([place])   . DDD (degenerative disc disease), cervical   . DDD (degenerative disc disease), lumbosacral   . Diabetes mellitus ([place])   . Diabetic neuropathy ([place])   . ESRD (end stage renal disease) on dialysis ([place])   . GERD (gastroesophageal reflux disease)   . NSVT (nonsustained ventricular tachycardia) ([place]) 10/20201  . OSA on CPAP   . Ulnar neuropathy    BUE  . Vitamin B 12 deficiency     Past Surgical History:  Procedure Laterality Date  . IR ANGIO VERTEBRAL SEL VERTEBRAL UNI L MOD SED  07/11/2020  . IR CT HEAD LTD  07/11/2020  . IR PERCUTANEOUS ART THROMBECTOMY/INFUSION INTRACRANIAL INC DIAG ANGIO  07/11/2020  . IR US GUIDE VASC ACCESS LEFT  07/12/2020  . RADIOLOGY WITH ANESTHESIA N/A 07/11/2020   Procedure: IR WITH ANESTHESIA;  Surgeon: Radiologist,  Medication, MD;  Location: [facility];  Service: Radiology;  Laterality: N/A;     Home Medications:  Prior to Admission medications   Medication Sig Start Date End Date Taking? Authorizing Provider  amitriptyline (ELAVIL) 25 MG tablet Take 25 mg by mouth at bedtime. 06/16/20   [provider]  amLODipine (NORVASC) 10 MG tablet Take 10 mg by mouth daily. 06/16/20   [provider]  atorvastatin (LIPITOR) 80 MG tablet Take 80 mg by mouth every evening. 04/06/19   [provider]  celecoxib (CELEBREX) 100 MG capsule Take 100 mg by mouth daily. 06/16/20   [provider]  cholecalciferol (VITAMIN D3) 25 MCG (1000 UNIT) tablet Take 1,000 Units by mouth daily.    [provider]  cinacalcet (SENSIPAR) 60 MG tablet Take 60 mg by mouth daily.    [provider]  gabapentin (NEURONTIN) 300 MG capsule Take 300 mg by mouth daily. 04/30/14   [provider]  HUMALOG 100 UNIT/ML injection Inject 3-13 Units into the skin 3 (three) times daily before meals. Per sliding scale 06/25/20   [provider]  insulin glargine (LANTUS) 100 UNIT/ML Solostar Pen Inject 80 Units into the skin at bedtime. 02/22/18   [provider]  losartan (COZAAR) 50 MG tablet Take 50 mg by mouth daily. 06/16/20   [provider]  sevelamer carbonate (RENVELA) 800 MG tablet Take 800 mg by mouth 3 (three) times daily. 06/16/20   [provider]  SYNTHROID 75 MCG tablet Take 75 mcg by mouth daily. 06/16/20   [provider]  torsemide (DEMADEX) 20 MG tablet Take 20 mg by mouth daily. 06/16/20   [provider]  vitamin B-12 (CYANOCOBALAMIN) 1000 MCG tablet Take 2,000 mcg by mouth daily.    [provider]  vitamin E 180 MG (400 UNITS) capsule Take 400 Units by mouth daily.    [provider]    Inpatient Medications: Scheduled Meds: .  stroke: mapping our early stages of recovery book   Does not apply Once  .  amitriptyline  25 mg Oral QHS  . amLODipine  10 mg Oral Daily  . atorvastatin  80 mg Oral QPM  . cholecalciferol  1,000 Units Oral Daily  . cinacalcet  60 mg Oral Q breakfast  . gabapentin  300 mg Oral Daily  . insulin aspart  0-6 Units Subcutaneous TID WC  . insulin glargine  40 Units Subcutaneous QHS  . levothyroxine  50 mcg Intravenous Daily  . losartan  50 mg Oral Daily  . sevelamer carbonate  800 mg Oral TID WC  . torsemide  20 mg Oral Daily  . vitamin B-12  2,000 mcg Oral Daily  . vitamin E  400 Units Oral Daily   Continuous Infusions:  PRN Meds: acetaminophen **OR** acetaminophen (TYLENOL) oral liquid 160 mg/5 mL **OR** acetaminophen, hydrALAZINE, senna-docusate  Allergies:   No Known Allergies  Social History:   Social History   Socioeconomic History  . Marital status: Single    Spouse name: Not on file  . Number of children: Not on file  . Years of education: Not on file  . Highest education level: Not on file  Occupational History  . Not on file  Tobacco Use  . Smoking status: Never Smoker  . Smokeless tobacco: Never Used  Substance and Sexual Activity  . Alcohol use: Not Currently  . Drug use: Not Currently  . Sexual activity: Not Currently  Other Topics Concern  . Not on file  Social History Narrative  . Not on file   Social Determinants of Health   Financial Resource Strain: Not on file  Food Insecurity: Not on file  Transportation Needs: Not on file  Physical Activity: Not on file  Stress: Not on file  Social Connections: Not on file  Intimate Partner Violence: Not on file    Family History:   Family History  Problem Relation Age of Onset  . Diabetes Mother   . Heart disease Mother   . Diabetes Father   . Heart disease Father   . Diabetes Sister      ROS:  Please see the history of present illness.  All other ROS reviewed and negative.     Physical Exam/Data:   Vitals:   07/23/20 2112 07/24/20 0107 07/24/20 0410 07/24/20 0742  BP:  128/66 134/78 (!) 124/57 (!) 126/54  Pulse: (!) 54 (!) 52 (!) 51 (!) 52  Resp: '15 19 14 15  '$ Temp: 97.6 F (36.4 C) (!) 97.2 F (36.2 C) 97.7 F (36.5 C) 98.8 F (37.1 C)  TempSrc: Axillary Axillary Axillary Axillary  SpO2:  97% 100% 100%  Weight:   115.3 kg    No intake or output data in the 24 hours ending 07/24/20 0929 Last 3 Weights 07/24/2020 07/23/2020 07/23/2020  Weight (lbs) 254 lb 3.1 oz 243 lb 9.7 oz 246 lb 11.1 oz  Weight (kg) 115.3 kg 110.5 kg 111.9 kg     Body mass index is 38.65 kg/m.  General:  Well nourished, well developed, opened eyes briefly to verbal HEENT: normal Lymph: no adenopathy Neck: no JVD Endocrine:  No thryomegaly Vascular: No carotid bruits Cardiac:   RRR; no murmurs gallops or rubs Lungs:  CTA b/l (ant auscultation only), pt did not follow commands to deep breath, no wheezing, rhonchi or rales  Abd: soft, not distended Ext: no edema Musculoskeletal:  No deformities Skin: warm and dry  Neuro:  Full neuro exam not done, presentation currently appears unchanged from neuro visit this AM Psych:  Unable to assess  EKG:  The EKG was personally reviewed and demonstrates:    #1 low atrial/junctional rhythm, 52bpm, QRS 48m Today junctional rhythm 51bpm, QRS 945m OLD: 07/15/20 junctional rhythm 63bpm, QRS 8253mI am not convinced of CHB) 07/12/20 AFlutter (atypical) 86bpm   Telemetry:  Telemetry was personally reviewed and demonstrates:   Junctional rhythm, rates mid 40's-50's    Relevant CV Studies:  07/12/20: TTE IMPRESSIONS  1. Left ventricular ejection fraction, by estimation, is 55 to 60%. The  left ventricle has normal function. The left ventricle has no regional  wall motion abnormalities. There is moderate left ventricular hypertrophy.  Left ventricular diastolic  parameters are indeterminate.  2. Right ventricular systolic function is normal. The right ventricular  size is normal.  3. The mitral valve is normal in structure. No  evidence of mitral valve  regurgitation. No evidence of mitral stenosis.  4. The aortic valve is tricuspid. Aortic valve regurgitation is trivial.  Mild aortic valve sclerosis is present, with no evidence of aortic valve  stenosis.  5. The inferior vena cava is dilated in size with >50% respiratory  variability, suggesting right atrial pressure of 8 mmHg.   [facility] care everywhere cardiology note: Zio patch monitor March of this year (2021) that showed 41 episodes of SVT, with minimum heart rate of 36 bpm and maximum heart rate of 146 bpm with intermittent episodes of junctional rhythm. It appears that she was symptomatic with the junctional rhythm and had lightheaded and dizziness    Laboratory Data:  High Sensitivity Troponin:   Recent Labs  Lab 07/23/20 2012 07/23/20 2205  TROPONINIHS 21* 22*     Chemistry Recent Labs  Lab 07/23/20 1300 07/23/20 1723 07/24/20 0303  NA 133* 135 137  K 4.4 4.2 4.1  CL 93* 93* 96*  CO2 20* 26 27  GLUCOSE 268* 136* 89  BUN 61* 31* 32*  CREATININE 12.72* 8.25* 9.11*  CALCIUM 8.9 9.0 9.1  GFRNONAA 3* 5* 5*  ANIONGAP 20* 16* 14    Recent Labs  Lab 07/19/20 0505 07/21/20 0528 07/22/20 0453 07/23/20 1300  PROT 7.0  --   --   --   ALBUMIN 3.3* 3.5 3.6 3.6  AST 17  --   --   --   ALT 16  --   --   --   ALKPHOS 76  --   --   --   BILITOT 0.8  --   --   --    Hematology Recent Labs  Lab 07/22/20 0453 07/23/20 1300 07/24/20 0303  WBC 7.8 8.2 6.8  RBC 4.09 3.88 3.96  HGB 12.8 11.7* 12.3  HCT 38.6 37.1 37.8  MCV 94.4 95.6 95.5  MCH 31.3 30.2 31.1  MCHC 33.2 31.5 32.5  RDW 14.7 14.8 14.9  PLT 241 241 272   BNPNo results for input(s): BNP, PROBNP in the last 168 hours.  DDimer No results for input(s): DDIMER in the last 168 hours.   Radiology/Studies:   CT ANGIO HEAD W OR WO CONTRAST Result Date: 07/23/2020 CLINICAL DATA:  Prior stroke, bleed on CT head EXAM: CT ANGIOGRAPHY HEAD TECHNIQUE: Multidetector CT imaging of the head  was performed using the standard protocol during bolus administration of intravenous contrast. Multiplanar CT image reconstructions and MIPs were obtained to evaluate the vascular anatomy. CONTRAST:  54m OMNIPAQUE IOHEXOL 350 MG/ML SOLN COMPARISON:  Correlation made with prior CT and MR imaging FINDINGS: CTA HEAD Anterior circulation: Intracranial internal carotid arteries are patent with calcified plaque causing mild stenosis. Recanalization of prior thrombus with now patent left middle cerebral artery. Improved flow within the distal left MCA territory. Right middle cerebral artery and both anterior cerebral arteries are patent. Posterior circulation: Intracranial vertebral, basilar, and posterior cerebral arteries are patent. Left posterior communicating artery is present. Venous sinuses: Not well evaluated. Review of the MIP images confirms the above findings IMPRESSION: Left MCA is now patent.  No new abnormality. Electronically Signed   By: PMacy MisM.D.   On: 07/23/2020 17:18   CT HEAD WO CONTRAST Addendum Date: 07/23/2020   ADDENDUM REPORT: 07/23/2020 17:06 ADDENDUM: These results were called by telephone at the time of interpretation on 07/23/2020 at 4:55 pm to provider Dr. MKathrynn Speed who verbally acknowledged these results. As discussed at this time, petechial hemorrhage was present within the medial left occipital lobe on the prior brain MRI of 07/12/2020. Electronically Signed   By: KKellie SimmeringDO   On: 07/23/2020 17:06  Result Date: 07/23/2020 CLINICAL DATA:  Mental status change, unknown cause. EXAM: CT HEAD WITHOUT CONTRAST TECHNIQUE: Contiguous axial images were obtained from the base of the skull through the vertex without intravenous contrast. COMPARISON:  Brain MRI 07/12/2020. Report from thrombectomy 07/11/2020. CT angiogram head/neck 07/11/2020. FINDINGS: Brain: Mild cerebral and cerebellar atrophy. New from prior exams, there is a 1.9 x 1.3 cm focus of acute parenchymal  hemorrhage within the paramedian left occipital lobe at site of a known subacute infarct left PCA territory. Petechial hemorrhage is present within the left caudate and lentiform nuclei and within the cortex of the left frontotemporal operculum, left insula and left temporal lobe at site of a known subacute left MCA territory infarct. Minimal mass effect with subtle partial effacement of the left lateral ventricle. No midline shift. No extra-axial fluid collection. No evidence of intracranial mass. Vascular: No hyperdense vessel.  Atherosclerotic calcifications. Skull: Normal. Negative for fracture or focal lesion. Sinuses/Orbits: Rightward gaze. Moderate partial opacification of right ethmoid air cells posteriorly. Minimal partial opacification of left ethmoid air cells. IMPRESSION: 1.9 x 1.3 cm acute parenchymal hemorrhage within the paramedian left occipital lobe at site of a known subacute left PCA territory infarct. Petechial hemorrhage within the left basal ganglia, left frontoparietal operculum, left insula and left temporal lobe at site of a known subacute left MCA territory infarct. Subtle mass effect with partial effacement of the left lateral ventricle. No midline shift. Electronically Signed: By: KKellie SimmeringDO On: 07/23/2020 16:45     EEG adult Result Date: 07/23/2020 YLora Havens MD  07/23/2020  7:22 PM Patient Name: AVY SIMONYAN MRN: [id number] Epilepsy Rachael Herrera: Lora Havens Referring Physician/Provider: Clance Boll Date: 07/23/2020 Duration: 24.20 mins Patient history:  60 yo F with Left PCA infarct and overlying hemorrhage noted to have transient unresponsive episode of unknown etiology. EEG to evaluate for seizure Level of alertness: Awake AEDs during EEG study: None Technical aspects: This EEG study was done with scalp electrodes positioned according to the 10-20 International system of electrode placement. Electrical activity was acquired at a sampling rate of '500Hz'$  and  reviewed with a high frequency filter of '70Hz'$  and a low frequency filter of '1Hz'$ . EEG data were recorded continuously and digitally stored. Description: The posterior dominant rhythm consists of 9 Hz activity of moderate voltage (25-35 uV) seen predominantly in posterior head regions, symmetric and reactive to eye opening and eye closing. EEG showed ntermittent 3 to 6 Hz theta-delta slowing in left temporal region. Hyperventilation and photic stimulation were not performed.   ABNORMALITY -Intermittent slow, left temporal region. IMPRESSION: This study is suggestive of nonspecific left temporal dysfunction. No seizures or epileptiform discharges were seen throughout the recording. Priyanka O Yadav     Assessment and Plan:   1. Tachy-brady      Looks like she has been in junctional rhythm since 2/21 the best that I can tell, rates the same, generally 50's There is a brief EP note date 2/23  pt appears to be in a fib/flutter with a narrow ventricular rhythm in the 40-50s.   She has been able to tolerate PT   BP is OK Her HR 55 while I am with her and she is minimally responsive Just back from CT   Neurology NP responded to rapid response this AM holdmg sedation meds TSH as per medicine team Holding eliquis until CT resulted  I am not convinced her new AMS is 2/2 her bradycardia with rates in the 50's currently and no clear change in her HRs from over a week ago.  Dr. Curt Bears Marianita Botkin see once out of procedure   Risk Assessment/Risk Scores:   For questions or updates, please contact [facility] Please consult [email or web address] for contact info under    Signed, Baldwin Jamaica, PA-C  07/24/2020 9:29 AM  I have seen and examined this patient with Tommye Standard.  Agree with above, note added to reflect my findings.  On exam, RRR, no murmurs.   On exam, patient is doing well, awake and alert.  There was reports that she was not awake and alert previously.  Details in the note above.  She has been  in a junctional rhythm ever since she has been put on the monitor.  Her heart rates have been consistently in the 50s without evidence of high-grade heart block or heart rates lower than the 50s.  She has retrograde P waves.  I do not feel that pacemaker implant is indicated.  She is not bradycardic to the point that this would explain her lethargy, additionally she is awake and alert and not lethargic at the moment.  She would at this point, prefer to avoid pacemaker implant as well.  She should follow-up with her outpatient cardiologist.  EP to sign off.  No further recommendations at this time.  Terryon Pineiro M. Camri Molloy MD 07/24/2020 11:19 AM

## 2020-07-24 NOTE — Progress Notes (Signed)
Neurology Progress Note Subjective: No acute events overnight.  Patient somnolent on examination this morning without verbalizations. Per night RN, patient was confused when she was verbalizing and has remained somnolent since rapid response event yesterday.   Exam: Vitals:   07/24/20 0107 07/24/20 0410  BP: 134/78 (!) 124/57  Pulse: (!) 52 (!) 51  Resp: 19 14  Temp: (!) 97.2 F (36.2 C) 97.7 F (36.5 C)  SpO2: 97% 100%   Gen: In bed, somnolent, continuous EEG leads remain in place with video monitor at bedside Resp: non-labored breathing, no acute distress Abd: soft, non-distended  Neuro: Mental Status: Patient opens eyes to loud voice but does not track examiner, does not follow commands. Patient responds to pain and is purposeful on the left pushing the examiner with application of noxious stimuli. Does not vocalize during assessment. Does not nod to answer examiner questions.  Speech: Mute CN: Pupils are equal, round, reactive to light bilaterally 2 mm/brisk. Does not track examiner, active resistance to eye opening. Rolls eyes rightward and upward with attempt at examination.  Unable to assess facial sensation to light touch, face appears symmetric resting. Unable to assess hearing, tongue protrusion, phonation, palate elevation, head appears midline. Motor: Left upper and lower extremities with purposeful movement and localization, pushes examiner away with noxious stimuli application, brisk withdrawal of left lower extremity. Withdrawal of right upper and lower extremities with application of noxious stimuli. Grasp reflex present in bilateral upper extremities. Does not follow commands.  Sensory: Withdrawals all extremities with application of noxious stimuli, left withdrawals more briskly in upper and lower extremities than the right.  DTR: 2+ and symmetric in biceps and brachioradialis, 1+ patellae Plantars: toes are downgoing bilaterally  Pertinent Labs: CBC     Component Value Date/Time   WBC 6.8 07/24/2020 0303   RBC 3.96 07/24/2020 0303   HGB 12.3 07/24/2020 0303   HCT 37.8 07/24/2020 0303   PLT 272 07/24/2020 0303   MCV 95.5 07/24/2020 0303   MCH 31.1 07/24/2020 0303   MCHC 32.5 07/24/2020 0303   RDW 14.9 07/24/2020 0303   LYMPHSABS 1.4 07/19/2020 0505   MONOABS 1.1 (H) 07/19/2020 0505   EOSABS 0.2 07/19/2020 0505   BASOSABS 0.1 07/19/2020 0505  CMP     Component Value Date/Time   NA 137 07/24/2020 0303   K 4.1 07/24/2020 0303   CL 96 (L) 07/24/2020 0303   CO2 27 07/24/2020 0303   GLUCOSE 89 07/24/2020 0303   BUN 32 (H) 07/24/2020 0303   CREATININE 9.11 (H) 07/24/2020 0303   CALCIUM 9.1 07/24/2020 0303   PROT 7.0 07/19/2020 0505   ALBUMIN 3.6 07/23/2020 1300   AST 17 07/19/2020 0505   ALT 16 07/19/2020 0505   ALKPHOS 76 07/19/2020 0505   BILITOT 0.8 07/19/2020 0505   GFRNONAA 5 (L) 07/24/2020 0303  Ammonia 9 Lab Results  Component Value Date   TSH 13.148 (H) 07/23/2020  ABG    Component Value Date/Time   PHART 7.439 07/23/2020 1614   PCO2ART 39.5 07/23/2020 1614   PO2ART 88.1 07/23/2020 1614   HCO3 26.3 07/23/2020 1614   TCO2 28 07/11/2020 1446   O2SAT 96.0 07/23/2020 1614   Imaging: EEG 3/1: "This study is suggestive of nonspecific left temporal dysfunction. No seizures or epileptiform discharges were seen throughout the recording."   CTH:  1.9 x 1.3 cm acute parenchymal hemorrhage within the paramedian left occipital lobe at site of a known subacute left PCA territory infarct. Petechial hemorrhage within the  left basal ganglia, left frontoparietal operculum, left insula and left temporal lobe at site of a known subacute left MCA territory infarct. Subtle mass effect with partial effacement of the left lateral ventricle. No midline shift.  CT angio: Left MCA is now patent.  No new abnormality.  Anibal Henderson, AGACNP-BC [facility] [phone]  I have seen the patient reviewed the above note.   She apparently was somewhat lethargic this morning, but on my exam, she is much more awake.  Of note, her daughter reports that she is often severely lethargic after dialysis and sometimes is difficult to wake up.  MS: Awake, alert, , she is able to count fingers, but does not reliably follow commands, she perseverates on "yes" when asked any question. CN: Right facial weakness, extraocular movements are intact, she does appear to build to count at least in the right lower field Motor: She is able to lift both arms against gravity, she withdraws the right leg, but does not lift against gravity. Sensory: She endorses sensation bilaterally  I have reviewed both her CT from yesterday, her previous MRI, and her CT from today.  She has significant hyperdensity throughout the regions affected by the infarct, consistent with contrast enhancement.  She has a small hematoma which is stable from yesterday.  There was hemorrhage in this area on the MRI on the 18th, though it was more of a confluent petechial pattern as opposed to hematoma.  Impression:  60 year old female with recent large stroke status post thrombectomy who had a decreased level of consciousness of unknown etiology during dialysis. Consider seizure with post-ictal state, lethargy due to dialysis(e.g dialysis disequilibrium syndrome).  I would not start antiepileptic therapy with the current information that we have, unless she were to have further events.  Recommendations: - I would repeat CT again tomorrow - Continue holding Eliquis  - Hold sedating medications at this time -Continue treating hypothyroidism -Can discontinue EEG monitoring at this time -Neurology will continue to follow  Roland Rack, MD [facility] [phone]  If 7pm- 7am, please page neurology on call as listed in [place].

## 2020-07-24 NOTE — Progress Notes (Signed)
Inpatient Rehab Admissions Coordinator:   Noted that Pt. Transferred from CIR to acute. I spoke with Pt. 's daughter who wants her to return to CIR when medically stable. Spoke with Dr. Ranell Patrick , rehab MD, who agrees that pt. Would benefit from continued CIR level therapies. I will place consult order.   Clemens Catholic, [place], [place] Admissions Coordinator  [phone] ([place]) [phone] (office)

## 2020-07-24 NOTE — Progress Notes (Signed)
Patient ID: Rachael Herrera, female   DOB: 1961-05-14, 60 y.o.   MRN: [id number] S: Events of last 24 hours noted.  Pt remains nonverbal and unresponsive. O:BP (!) 137/57 (BP Location: Right Arm)   Pulse (!) 52   Temp 97.9 F (36.6 C) (Oral)   Resp 20   Wt 115.3 kg   SpO2 100%   BMI 38.65 kg/m  No intake or output data in the 24 hours ending 07/24/20 1322 Intake/Output: No intake/output data recorded.  Intake/Output this shift:  No intake/output data recorded. Weight change:  Gen: NAD, lethargic CVS: bradycardic at 52 Resp: CTA Abd: obese, +BS Ext: no edema LUE AVF +T/B  Recent Labs  Lab 07/18/20 0321 07/19/20 0505 07/21/20 0528 07/22/20 0453 07/23/20 1300 07/23/20 1723 07/23/20 2012 07/24/20 0303  NA 132* 134* 137 135 133* 135  --  137  K 4.5 4.2 3.9 4.0 4.4 4.2  --  4.1  CL 93* 95* 96* 93* 93* 93*  --  96*  CO2 '25 25 25 25 '$ 20* 26  --  27  GLUCOSE 237* 192* 144* 112* 268* 136*  --  89  BUN 44* 32* 32* 44* 61* 31*  --  32*  CREATININE 9.38* 7.66* 8.14* 9.88* 12.72* 8.25*  --  9.11*  ALBUMIN  --  3.3* 3.5 3.6 3.6  --   --   --   CALCIUM 8.7* 8.8* 9.0 9.2 8.9 9.0  --  9.1  PHOS  --   --  5.1* 5.2* 6.7*  --  4.6  --   AST  --  17  --   --   --   --   --   --   ALT  --  16  --   --   --   --   --   --    Liver Function Tests: Recent Labs  Lab 07/19/20 0505 07/21/20 0528 07/22/20 0453 07/23/20 1300  AST 17  --   --   --   ALT 16  --   --   --   ALKPHOS 76  --   --   --   BILITOT 0.8  --   --   --   PROT 7.0  --   --   --   ALBUMIN 3.3* 3.5 3.6 3.6   No results for input(s): LIPASE, AMYLASE in the last 168 hours. Recent Labs  Lab 07/23/20 1612  AMMONIA <9*   CBC: Recent Labs  Lab 07/18/20 0321 07/19/20 0505 07/22/20 0453 07/23/20 1300 07/24/20 0303  WBC 7.4 6.4 7.8 8.2 6.8  NEUTROABS  --  3.6  --   --   --   HGB 10.7* 11.3* 12.8 11.7* 12.3  HCT 33.8* 35.9* 38.6 37.1 37.8  MCV 96.6 96.5 94.4 95.6 95.5  PLT 214 216 241 241 272   Cardiac  Enzymes: No results for input(s): CKTOTAL, CKMB, CKMBINDEX, TROPONINI in the last 168 hours. CBG: Recent Labs  Lab 07/23/20 1535 07/23/20 1711 07/23/20 2250 07/24/20 0759 07/24/20 1156  GLUCAP 107* 122* 107* 104* 123*    Iron Studies: No results for input(s): IRON, TIBC, TRANSFERRIN, FERRITIN in the last 72 hours. Studies/Results: CT ANGIO HEAD W OR WO CONTRAST  Result Date: 07/23/2020 CLINICAL DATA:  Prior stroke, bleed on CT head EXAM: CT ANGIOGRAPHY HEAD TECHNIQUE: Multidetector CT imaging of the head was performed using the standard protocol during bolus administration of intravenous contrast. Multiplanar CT image reconstructions and MIPs were obtained to evaluate  the vascular anatomy. CONTRAST:  52m OMNIPAQUE IOHEXOL 350 MG/ML SOLN COMPARISON:  Correlation made with prior CT and MR imaging FINDINGS: CTA HEAD Anterior circulation: Intracranial internal carotid arteries are patent with calcified plaque causing mild stenosis. Recanalization of prior thrombus with now patent left middle cerebral artery. Improved flow within the distal left MCA territory. Right middle cerebral artery and both anterior cerebral arteries are patent. Posterior circulation: Intracranial vertebral, basilar, and posterior cerebral arteries are patent. Left posterior communicating artery is present. Venous sinuses: Not well evaluated. Review of the MIP images confirms the above findings IMPRESSION: Left MCA is now patent.  No new abnormality. Electronically Signed   By: PMacy MisM.D.   On: 07/23/2020 17:18   CT HEAD WO CONTRAST  Addendum Date: 07/24/2020   ADDENDUM REPORT: 07/24/2020 12:26 ADDENDUM: The imaging was discussed with Dr. KLeonel Ramsay The patient had intravenous contrast yesterday for CTA. The increased density in the left MCA infarct may be due to enhancement of subacute infarction rather than hemorrhage. There was a small amount of petechial hemorrhage in the left MCA infarct yesterday however the  density is diffusely distributed throughout the infarct and I think is more likely contrast enhancement than hemorrhage. The hyperdensity in the left occipital lobe was present yesterday and is due to recent hemorrhage. This may also show slight enhancement compared to yesterday's study. Electronically Signed   By: CFranchot GalloM.D.   On: 07/24/2020 12:26   Addendum Date: 07/24/2020   ADDENDUM REPORT: 07/24/2020 12:09 ADDENDUM: These results were called by telephone at the time of interpretation on 07/24/2020 at 12:09 pm to provider JBroadus John who verbally acknowledged these results. Electronically Signed   By: CFranchot GalloM.D.   On: 07/24/2020 12:09   Result Date: 07/24/2020 CLINICAL DATA:  Stroke.  Intracranial hemorrhage. EXAM: CT HEAD WITHOUT CONTRAST TECHNIQUE: Contiguous axial images were obtained from the base of the skull through the vertex without intravenous contrast. COMPARISON:  CT head 07/23/2020 FINDINGS: Brain: Progressive hemorrhage in left MCA infarct. There is new/progressive hemorrhage in the left caudate, left basal ganglia, left insula, as well as left frontal parietal lobes compared to yesterday when a small amount of hemorrhage is present. Mild amount of subarachnoid hemorrhage also present. Small area of hemorrhage in left occipital infarct unchanged from yesterday. Increased mass-effect on the left lateral ventricle without significant midline shift. Vascular: Diffuse increased density of the circle Willis due to CT angio head yesterday. Skull: Negative Sinuses/Orbits: Mucosal edema paranasal sinuses.  Negative orbit Other: None IMPRESSION: Progressive hemorrhage in left MCA infarct involving the basal ganglia, insula, and left frontal parietal lobe when compared to CT yesterday. Smaller area of hemorrhage in the left occipital lobe infarct is unchanged from yesterday. No midline shift.  Local mass-effect on the left lateral ventricle. Electronically Signed: By: CFranchot GalloM.D. On:  07/24/2020 11:55   CT HEAD WO CONTRAST  Addendum Date: 07/23/2020   ADDENDUM REPORT: 07/23/2020 17:06 ADDENDUM: These results were called by telephone at the time of interpretation on 07/23/2020 at 4:55 pm to provider Dr. MKathrynn Speed who verbally acknowledged these results. As discussed at this time, petechial hemorrhage was present within the medial left occipital lobe on the prior brain MRI of 07/12/2020. Electronically Signed   By: KKellie SimmeringDO   On: 07/23/2020 17:06   Result Date: 07/23/2020 CLINICAL DATA:  Mental status change, unknown cause. EXAM: CT HEAD WITHOUT CONTRAST TECHNIQUE: Contiguous axial images were obtained from the base of the skull through  the vertex without intravenous contrast. COMPARISON:  Brain MRI 07/12/2020. Report from thrombectomy 07/11/2020. CT angiogram head/neck 07/11/2020. FINDINGS: Brain: Mild cerebral and cerebellar atrophy. New from prior exams, there is a 1.9 x 1.3 cm focus of acute parenchymal hemorrhage within the paramedian left occipital lobe at site of a known subacute infarct left PCA territory. Petechial hemorrhage is present within the left caudate and lentiform nuclei and within the cortex of the left frontotemporal operculum, left insula and left temporal lobe at site of a known subacute left MCA territory infarct. Minimal mass effect with subtle partial effacement of the left lateral ventricle. No midline shift. No extra-axial fluid collection. No evidence of intracranial mass. Vascular: No hyperdense vessel.  Atherosclerotic calcifications. Skull: Normal. Negative for fracture or focal lesion. Sinuses/Orbits: Rightward gaze. Moderate partial opacification of right ethmoid air cells posteriorly. Minimal partial opacification of left ethmoid air cells. IMPRESSION: 1.9 x 1.3 cm acute parenchymal hemorrhage within the paramedian left occipital lobe at site of a known subacute left PCA territory infarct. Petechial hemorrhage within the left basal ganglia, left  frontoparietal operculum, left insula and left temporal lobe at site of a known subacute left MCA territory infarct. Subtle mass effect with partial effacement of the left lateral ventricle. No midline shift. Electronically Signed: By: Kellie Simmering DO On: 07/23/2020 16:45   EEG adult  Result Date: 07/23/2020 Lora Havens, MD     07/23/2020  7:22 PM Patient Name: BLANCA SINQUEFIELD MRN: [id number] Epilepsy Attending: Lora Havens Referring Physician/Provider: Clance Boll Date: 07/23/2020 Duration: 24.20 mins Patient history:  60 yo F with Left PCA infarct and overlying hemorrhage noted to have transient unresponsive episode of unknown etiology. EEG to evaluate for seizure Level of alertness: Awake AEDs during EEG study: None Technical aspects: This EEG study was done with scalp electrodes positioned according to the 10-20 International system of electrode placement. Electrical activity was acquired at a sampling rate of '500Hz'$  and reviewed with a high frequency filter of '70Hz'$  and a low frequency filter of '1Hz'$ . EEG data were recorded continuously and digitally stored. Description: The posterior dominant rhythm consists of 9 Hz activity of moderate voltage (25-35 uV) seen predominantly in posterior head regions, symmetric and reactive to eye opening and eye closing. EEG showed ntermittent 3 to 6 Hz theta-delta slowing in left temporal region. Hyperventilation and photic stimulation were not performed.   ABNORMALITY -Intermittent slow, left temporal region. IMPRESSION: This study is suggestive of nonspecific left temporal dysfunction. No seizures or epileptiform discharges were seen throughout the recording. Priyanka Barbra Sarks   Overnight EEG with video  Result Date: 07/24/2020 Lora Havens, MD     07/24/2020 12:31 PM Patient Name: AZARYA BINDA MRN: [id number] Epilepsy Attending: Lora Havens Referring Physician/Provider: Dr Roland Rack Duration: 07/23/2020 1839 to 07/24/2020 1058  Patient history:  60 yo F with Left PCA infarct and overlying hemorrhage noted to have transient unresponsive episode of unknown etiology. EEG to evaluate for seizure  Level of alertness: Awake, asleep  AEDs during EEG study: None  Technical aspects: This EEG study was done with scalp electrodes positioned according to the 10-20 International system of electrode placement. Electrical activity was acquired at a sampling rate of '500Hz'$  and reviewed with a high frequency filter of '70Hz'$  and a low frequency filter of '1Hz'$ . EEG data were recorded continuously and digitally stored.  Description: The posterior dominant rhythm consists of 9 Hz activity of moderate voltage (25-35 uV) seen predominantly in posterior head regions,  symmetric and reactive to eye opening and eye closing.  Sleep was characterized by vertex waves, sleep spindles (12 to 14 Hz), maximal frontocentral region, REM sleep.  EEG showed intermittent 3 to 6 Hz theta-delta slowing in left temporal region. Hyperventilation and photic stimulation were not performed.    ABNORMALITY -Intermittent slow, left temporal region.  IMPRESSION: This study is suggestive of nonspecific left temporal dysfunction. No seizures or epileptiform discharges were seen throughout the recording.  Lora Havens   .  stroke: mapping our early stages of recovery book   Does not apply Once  . amitriptyline  25 mg Oral QHS  . amLODipine  10 mg Oral Daily  . atorvastatin  80 mg Oral QPM  . cholecalciferol  1,000 Units Oral Daily  . cinacalcet  60 mg Oral Q breakfast  . gabapentin  300 mg Oral Daily  . insulin aspart  0-6 Units Subcutaneous TID WC  . insulin glargine  15 Units Subcutaneous BID  . levothyroxine  50 mcg Intravenous Daily  . losartan  50 mg Oral Daily  . sevelamer carbonate  800 mg Oral TID WC  . torsemide  20 mg Oral Daily  . vitamin B-12  2,000 mcg Oral Daily  . vitamin E  400 Units Oral Daily    BMET    Component Value Date/Time   NA 137 07/24/2020 0303   K  4.1 07/24/2020 0303   CL 96 (L) 07/24/2020 0303   CO2 27 07/24/2020 0303   GLUCOSE 89 07/24/2020 0303   BUN 32 (H) 07/24/2020 0303   CREATININE 9.11 (H) 07/24/2020 0303   CALCIUM 9.1 07/24/2020 0303   GFRNONAA 5 (L) 07/24/2020 0303   CBC    Component Value Date/Time   WBC 6.8 07/24/2020 0303   RBC 3.96 07/24/2020 0303   HGB 12.3 07/24/2020 0303   HCT 37.8 07/24/2020 0303   PLT 272 07/24/2020 0303   MCV 95.5 07/24/2020 0303   MCH 31.1 07/24/2020 0303   MCHC 32.5 07/24/2020 0303   RDW 14.9 07/24/2020 0303   LYMPHSABS 1.4 07/19/2020 0505   MONOABS 1.1 (H) 07/19/2020 0505   EOSABS 0.2 07/19/2020 0505   BASOSABS 0.1 07/19/2020 0505   Assessment/Recommendations: DONNELLE MCBROOM is a/an 60 y.o. female with a past medical history significant for ESRD, admitted for CVA after dialysis.     Assessment/Plan:  1. AMS- happened acutely with HD 07/23/20 without drop in BP.  CT of head with acute parenchymal hemorrhage within the paramedian left occipital lobe at site of known subacute left PCA territory infarct.  Neuro following and recommend EEG and CT scan again tomorrow. 2. ESRD - cont with HD on TTS schedule 3. HTN/volume - stable 4. 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 CVA - s/p thrombectomy and was in CIR. 7. Bradycardia - has been persistent and not on beta-blocker.  EP re-consulted  Donetta Potts, MD [employer] [phone]

## 2020-07-24 NOTE — Procedures (Addendum)
Patient Name: LORNE ARONSON  MRN: [id number]  Epilepsy Attending: Lora Havens  Referring Physician/Provider: Dr Roland Rack Duration: 07/23/2020 1839 to 07/24/2020 1058  Patient history: 60 yo F with Left PCA infarct and overlying hemorrhage noted to have transient unresponsive episode of unknown etiology. EEG to evaluate for seizure  Level of alertness: Awake, asleep  AEDs during EEG study: None  Technical aspects: This EEG study was done with scalp electrodes positioned according to the 10-20 International system of electrode placement. Electrical activity was acquired at a sampling rate of '500Hz'$  and reviewed with a high frequency filter of '70Hz'$  and a low frequency filter of '1Hz'$ . EEG data were recorded continuously and digitally stored.   Description: The posterior dominant rhythm consists of 9 Hz activity of moderate voltage (25-35 uV) seen predominantly in posterior head regions, symmetric and reactive to eye opening and eye closing.  Sleep was characterized by vertex waves, sleep spindles (12 to 14 Hz), maximal frontocentral region, REM sleep.  EEG showed intermittent 3 to 6 Hz theta-delta slowing in left temporal region. Hyperventilation and photic stimulation were not performed.     ABNORMALITY -Intermittent slow, left temporal region.   IMPRESSION: This study is suggestive of nonspecific left temporal dysfunction. No seizures or epileptiform discharges were seen throughout the recording.  Ausha Sieh Barbra Sarks

## 2020-07-24 NOTE — Evaluation (Signed)
Physical Therapy Evaluation Patient Details Name: Rachael Herrera MRN: [id number] DOB: [birth year] Today's Date: 07/24/2020   History of Present Illness  60 yo female presenting with new L occip and basal ganglia hemorrhages, after recent L MCA and PCA infarcts.  Was in CIR when bradycardia and AMS occurred, and MD then found her expanded cerebral changesbut no seizure activity on EEG.  Prev and current R side weakness, L gaze preference and R side mild incoordination.  Pt is delayed in motor responses, aphasic and will use yes answer when this is not accurate.    PMHx:   thrombectomy 2/17. DM II, OSA, CKD IV, HTN, HLD, and morbid obesity.  Clinical Impression  Pt returns to acute care with a progression of previous stroke event with findings of AMS and bradycardia.  Her plan is to return to CIR since pt is up to walk with issues of motor planning and attention to R side, with mild sensory changes and mild incoordination of RLE.  Follow along with her to increase balance, safety of mobility choices and to increase standing balance control.  Pt is inattentive to R side, will need to be prompted and positioned to increase awareness and attention to R side, to educate her family about the deficits and best course of action, and to maximize her therapy intervention in CIR.    Follow Up Recommendations CIR    Equipment Recommendations  None recommended by PT    Recommendations for Other Services Rehab consult     Precautions / Restrictions Precautions Precautions: Fall Precaution Comments: R side neglect Restrictions Weight Bearing Restrictions: No      Mobility  Bed Mobility Overal bed mobility: Needs Assistance Bed Mobility: Supine to Sit;Sit to Supine Rolling: Min guard   Supine to sit: Min guard;Min assist Sit to supine: Min guard   General bed mobility comments: bed rails and time needed to transfer back to bed and requires help to finish it    Transfers Overall transfer level:  Needs assistance Equipment used: Rolling walker (2 wheeled);1 person hand held assist Transfers: Sit to/from Stand Sit to Stand: From elevated surface;Min assist Stand pivot transfers: Min assist       General transfer comment: requires a cue for correction of hand placement every trial  Ambulation/Gait Ambulation/Gait assistance: Min guard;Min assist Gait Distance (Feet): 12 Feet (3+5+4) Assistive device: Rolling walker (2 wheeled) Gait Pattern/deviations: Step-to pattern;Wide base of support;Decreased stride length;Trunk flexed (delayed sequence, esp to move LLE with reliance on RLE) Gait velocity: decreased   General Gait Details: walker is steadying with additional time to sequence sidesteps on side of bed, R grip is weak and cannot rely on RUE to support  Stairs            Wheelchair Mobility    Modified Rankin (Stroke Patients Only) Modified Rankin (Stroke Patients Only) Pre-Morbid Rankin Score: No significant disability Modified Rankin: Moderately severe disability     Balance Overall balance assessment: Needs assistance Sitting-balance support: Feet supported Sitting balance-Leahy Scale: Fair     Standing balance support: Bilateral upper extremity supported;During functional activity Standing balance-Leahy Scale: Poor                               Pertinent Vitals/Pain Pain Assessment: No/denies pain    Home Living Family/patient expects to be discharged to:: Private residence Living Arrangements: Children Available Help at Discharge: Family Type of Home: House Home Access: Stairs to  enter Entrance Stairs-Rails: Can reach both;Right;Left Entrance Stairs-Number of Steps: 8 Home Layout: One level Home Equipment: None Additional Comments: chart history due to nonverbal pt    Prior Function Level of Independence: Independent         Comments: drove independently     Hand Dominance   Dominant Hand: Right    Extremity/Trunk  Assessment   Upper Extremity Assessment Upper Extremity Assessment: Defer to OT evaluation    Lower Extremity Assessment Lower Extremity Assessment: RLE deficits/detail RLE Deficits / Details: 4/5 strength with slow motor responses, reinstruction needed to complete tasks RLE Sensation: history of peripheral neuropathy (locates touches to limb correctly) RLE Coordination: decreased gross motor    Cervical / Trunk Assessment Cervical / Trunk Assessment: Other exceptions Cervical / Trunk Exceptions: forward flexed sitting and standing postures  Communication   Communication: Receptive difficulties;Expressive difficulties  Cognition Arousal/Alertness: Awake/alert Behavior During Therapy: Impulsive Overall Cognitive Status: Impaired/Different from baseline Area of Impairment: Problem solving;Awareness;Safety/judgement;Following commands;Memory;Attention;Orientation                 Orientation Level: Time;Situation Current Attention Level: Selective Memory: Decreased short-term memory Following Commands: Follows one step commands inconsistently;Follows one step commands with increased time Safety/Judgement: Decreased awareness of safety;Decreased awareness of deficits Awareness: Intellectual;Emergent Problem Solving: Slow processing;Requires verbal cues;Requires tactile cues General Comments: pt is able to get in and out of bed with minor help and will move if she can to get up before PT is ready      General Comments General comments (skin integrity, edema, etc.): Pt is up to stand and then with help sidesteps on side of bed.  Has reliance on L side and inattention to use of RUE esp to control grip and move efficiently to R side    Exercises     Assessment/Plan    PT Assessment Patient needs continued PT services  PT Problem List Decreased strength;Decreased balance;Decreased activity tolerance;Decreased mobility;Decreased coordination;Decreased cognition;Decreased  knowledge of use of DME;Decreased safety awareness;Cardiopulmonary status limiting activity       PT Treatment Interventions DME instruction;Gait training;Stair training;Functional mobility training;Therapeutic activities;Therapeutic exercise;Balance training;Neuromuscular re-education;Cognitive remediation;Patient/family education    PT Goals (Current goals can be found in the Care Plan section)  Acute Rehab PT Goals Patient Stated Goal: did not state but agreeable to gait training PT Goal Formulation: With patient Time For Goal Achievement: 08/07/20 Potential to Achieve Goals: Good    Frequency Min 4X/week   Barriers to discharge Inaccessible home environment 8 steps to enter house    Co-evaluation               AM-PAC PT "6 Clicks" Mobility  Outcome Measure Help needed turning from your back to your side while in a flat bed without using bedrails?: A Little Help needed moving from lying on your back to sitting on the side of a flat bed without using bedrails?: A Little Help needed moving to and from a bed to a chair (including a wheelchair)?: A Little Help needed standing up from a chair using your arms (e.g., wheelchair or bedside chair)?: A Little Help needed to walk in hospital room?: A Little Help needed climbing 3-5 steps with a railing? : A Lot 6 Click Score: 17    End of Session Equipment Utilized During Treatment: Gait belt Activity Tolerance: Patient tolerated treatment well Patient left: with call bell/phone within reach;in bed;with bed alarm set Nurse Communication: Mobility status PT Visit Diagnosis: Muscle weakness (generalized) (M62.81);Other abnormalities of gait and mobility (  R26.89);Unsteadiness on feet (R26.81);Difficulty in walking, not elsewhere classified (R26.2);Hemiplegia and hemiparesis Hemiplegia - dominant/non-dominant: Dominant Hemiplegia - caused by: Nontraumatic intracerebral hemorrhage    Time: 1343-1407 PT Time Calculation (min) (ACUTE  ONLY): 24 min   Charges:   PT Evaluation $PT Eval Moderate Complexity: 1 Mod PT Treatments $Gait Training: 8-22 mins       Ramond Dial 07/24/2020, 2:44 PM Mee Hives, PT MS Acute Rehab Dept. Number: [place] [facility] [place]

## 2020-07-24 NOTE — PMR Pre-admission (Shared)
PMR Admission Coordinator Pre-Admission Assessment  Patient: Rachael Herrera is an 60 y.o., female MRN: [id number] DOB: 06-Mar-1961 Height:   Weight: 115.3 kg Insurance Information HMO:    PPO: yes     PCP:      IPA:      80/20:      OTHER:  PRIMARY: U[employer] Medicare      Policy#: [id number]      Subscriber: Rachael Herrera CM Name:     Phone#: [phone]    Fax#: [phone] Pre-Cert#: ** 01/28/27      Employer: Not employed Benefits:  Phone #: [phone]     Name: Checked in online portal Eff. Date: 05/25/20     Deduct: $0      Out of Pocket Max: [phone] (met $0)      Life Max: N/A  CIR: $1480 admission copay      SNF: $0 copay for days 1-20; $194.50 for days 21-100 Outpatient: 100%     Co-Pay: none Home Health: 100%      Co-Pay: none DME: 80%     Co-Pay: 20% Providers: in network  SECONDARY: Medicaid of [place] with coverage code [facility] and is eligible on 6/64/40     Policy#: [id number]      Phone#: [phone]  Financial Counselor:        Phone#:    The "Data Collection Information Summary" for patients in Inpatient Rehabilitation Facilities with attached "Privacy Act [place] Records" was provided and verbally reviewed with: Patient The "Data Collection Information Summary" for patients in Inpatient Rehabilitation Facilities with attached "Privacy Act [place] Records" was provided and verbally reviewed with: Patient and Family  Emergency Contact Information Contact Information    Name Relation Home Work Mobile   [place] Daughter   [phone]   Jamison Oka Sister   [phone]      Current Medical History  Patient Admitting Diagnosis: CVA History of Present Illness: Rachael V Ellisonis a 60 y.o.right-handed femalewith history of hypertension as well as hyperlipidemia, end-stage renal disease with hemodialysis type 2 diabetes mellitus, NSVT /PVCs and bradycardia OSA, morbid obesity BMI 39.95. Presented 07/11/2020 with right side weakness gaze preference and  inability to communicate. BP in the 170s. Of note she was seen 07/09/2020 in the ED at [facility] for another episode that occurred during dialysis as well as bouts of bradycardia. CT the head was suggested but she left without this test. Patient had been followed in the past for bradycardia consider plans for pacemaker. Cranial CT scan showed acute left occipital lobe infarction. Suspect acute infarction of the body of the left caudate. CTA showed no large vessel occlusion or hemodynamically significant stenosis in the neck. Nonocclusive thrombus at the left MCA bifurcation. Occlusion of proximal left M3 MCA branch. Patient underwent successful thrombectomy per interventional radiology for M2 occlusion. Echocardiogram with ejection fraction of 55 to 60% no wall motion abnormalities. Latest MRI imaging showed acute ischemic infarct within the left MCA and PCA territories with a small amount of petechial blood products in left basal ganglia. No midline shift or mass-effect as of 07/12/2020. Patient is currently maintained on intravenous heparin and cardiology continues to follow for patient's history of bradycardia as well as a flutter. She is currently on a dysphagia #2 thin liquid diet. Hemodialysis ongoing as per renal services. Rachael Herrera. Was admitted to CIR 07/18/20 and was progressing toward goals; however, had an episode of decreased responsiveness and bradycardia and was transferred back to acute. CT of the head showed recent  hemorrhage in the occipital lobe.Bradiacardia thought to be 2/2 to hemorrhage, there are no new plans to place PPM during this admission. Plan is for follow-up with cardiology outpatient.  Therapy evaluations completed due to patient's right side weakness and inability to communicate recommendations for physical medicine rehab consult.   Complete NIHSS TOTAL: 7  Patient's medical record from [facility] has been reviewed by the rehabilitation  admission coordinator and physician.  Past Medical History  Past Medical History:  Diagnosis Date  . Cavitary pneumonia 06/2017   due to MSSA  . Claudication of both lower extremities ([place])   . DDD (degenerative disc disease), cervical   . DDD (degenerative disc disease), lumbosacral   . Diabetes mellitus ([place])   . Diabetic neuropathy ([place])   . ESRD (end stage renal disease) on dialysis ([place])   . GERD (gastroesophageal reflux disease)   . NSVT (nonsustained ventricular tachycardia) ([place]) 10/20201  . OSA on CPAP   . Stroke ([place])   . Ulnar neuropathy    BUE  . Vitamin B 12 deficiency      Precautions / Restrictions Precautions Precautions: Fall Restrictions Weight Bearing Restrictions: No   Has the patient had 2 or more falls or a fall with injury in the past year?No  Prior Activity Level Community (5-7x/wk): Rachael Herrera. was active in the community PTA  Prior Functional Level Prior Function Level of Independence: Independent Comments: Rachael Herrera driving herself to dialysis prior to admission  Self Care: Did the patient need help bathing, dressing, using the toilet or eating?  Independent  Indoor Mobility: Did the patient need assistance with walking from room to room (with or without device)? Independent  Stairs: Did the patient need assistance with internal or external stairs (with or without device)? Independent  Functional Cognition: Did the patient need help planning regular tasks such as shopping or remembering to take medications? Independent  Home Assistive Devices / Equipment  Prior Device Use: Indicate devices/aids used by the patient prior to current illness, exacerbation or injury? None of the above  Current Functional Level Cognition  Arousal/Alertness: Awake/alert Overall Cognitive Status: Impaired/Different from baseline Orientation Level: Oriented X4 Following Commands: Follows one step commands inconsistently,Follows one step commands with increased  time Safety/Judgement: Decreased awareness of safety,Decreased awareness of deficits General Comments: Rachael Herrera impulsive in starting transitions prior to being cued at times. Needs repeated simple commands to direct Rachael Herrera to attend to R side and maintain safety. Attention: Sustained Sustained Attention: Impaired Sustained Attention Impairment: Verbal basic,Functional basic Memory:  (TBA) Awareness: Impaired Awareness Impairment: Emergent impairment Problem Solving: Impaired Problem Solving Impairment: Functional basic Behaviors: Impulsive Safety/Judgment: Impaired    Extremity Assessment (includes Sensation/Coordination)  Upper Extremity Assessment: Defer to OT evaluation RUE Deficits / Details: AROM 80 shoulder flexion with automatic task and strong encouragement. Rachael Herrera decreased grasp and using L hand instead of using R UE for task RUE Coordination: decreased fine motor,decreased gross motor  Lower Extremity Assessment: Generalized weakness (Rachael Herrera slow to move BLE, but able to use funtionally for transfer. MMT limited due to impariged cognition)    ADLs  Overall ADL's : Needs assistance/impaired Eating/Feeding: Moderate assistance Eating/Feeding Details (indicate cue type and reason): spilling cup tryign to pick it up Grooming: Wash/dry face,Minimal assistance,Sitting Grooming Details (indicate cue type and reason): only washing L side of face. Rachael Herrera with wash cloth placed on face to see if Rachael Herrera would remove it due to communication deficits. Rachael Herrera shaking head to make it fall instead of using either arm Upper  Body Bathing: Maximal assistance Lower Body Bathing: Moderate assistance Upper Body Dressing : Moderate assistance Lower Body Dressing: Moderate assistance Toilet Transfer: +2 for physical assistance,Moderate assistance,Stand-pivot Toilet Transfer Details (indicate cue type and reason): simulate EOB to chair General ADL Comments: Rachael Herrera motivated by [employer]. Rachael Herrera with x4 solitaire games loaded on  ipad.    Mobility  Overal bed mobility: Needs Assistance Bed Mobility: Supine to Sit Rolling: Min assist Supine to sit: HOB elevated,Min assist Sit to supine: HOB elevated,Mod assist General bed mobility comments: HOB elevated, Rachael Herrera initiating moving legs towards EOB but needing minA for the trunk with HHA Rachael Herrera pulling on Rachael Herrera's hand to sit up EOB.    Transfers  Overall transfer level: Needs assistance Equipment used: Rolling walker (2 wheeled) Transfers: Sit to/from Stand Sit to Stand: From elevated surface,Min assist Stand pivot transfers: +2 safety/equipment,Min assist General transfer comment: Sit to stand 1x from EOB and 4x from recliner with minA for stability and cueing hand-over-hand for Rachael Herrera to use R hand to push up to stand and reach back to sit. MinAx2 for safety with stand step to R.    Ambulation / Gait / Stairs / Wheelchair Mobility  Ambulation/Gait Ambulation/Gait assistance: Min assist,+2 safety/equipment Gait Distance (Feet): 25 Feet Assistive device: Rolling walker (2 wheeled) Gait Pattern/deviations: Step-through pattern,Decreased step length - right,Decreased stride length General Gait Details: Rachael Herrera ambulates slowly with some unsteadiness, especially when stepping posteriorly, needing minAx2 for safety. She tends to push RW distal anteriorly and needs physical A to keep it proximal. Hand-over-hand directing of Rachael Herrera to attend to R hand for proper placement on RW. Gait velocity: decreased Gait velocity interpretation: <1.8 ft/sec, indicate of risk for recurrent falls    Posture / Balance Balance Overall balance assessment: Needs assistance Sitting-balance support: Single extremity supported,Feet supported Sitting balance-Leahy Scale: Fair Standing balance support: Bilateral upper extremity supported,During functional activity Standing balance-Leahy Scale: Poor Standing balance comment: External assistance and UE support on RW.    Special needs/care consideration Skin  WDL, Diabetic management On insulin for DM and Special service needs HD T-Th-Sat - in HD today 07/18/20     Previous Home Environment (from acute therapy documentation) Living Arrangements: Other (Comment)  Lives With: Daughter Available Help at Discharge: Family Type of Home: House Home Layout: One level Home Access: Stairs to enter Entrance Stairs-Rails: Can reach both,Right,Left Entrance Stairs-Number of Steps: 8 Bathroom Shower/Tub: Health visitor: Handicapped height Bathroom Accessibility: Yes How Accessible: Accessible via walker Home Care Services: No Additional Comments: Rachael Herrera unable to assist with history due to aphasia, no family present  Discharge Living Setting Plans for Discharge Living Setting: Patient's home Type of Home at Discharge: House Discharge Home Layout: One level Discharge Home Access: Stairs to enter Entrance Stairs-Rails: Right,Left,Can reach both Entrance Stairs-Number of Steps: 8 Discharge Bathroom Shower/Tub: Walk-in shower Discharge Bathroom Toilet: Handicapped height Discharge Bathroom Accessibility: Yes How Accessible: Accessible via walker  Social/Family/Support Systems Patient Roles: Other (Comment) Contact Information: [phone] Anticipated Caregiver: Dartha Rozzell Anticipated Caregiver's Contact Information: [phone] Ability/Limitations of Caregiver: Can provide Min A after a few weeks, but giving birth 2nd week of March Caregiver Availability: 24/7 Discharge Plan Discussed with Primary Caregiver: Yes Is Caregiver In Agreement with Plan?: Yes Does Caregiver/Family have Issues with Lodging/Transportation while Rachael Herrera is in Rehab?: No  Goals Patient/Family Goal for Rehab: Rachael Herrera/OT/SLP Supervision Expected length of stay: 16-21 days Rachael Herrera/Family Agrees to Admission and willing to participate: Yes Program Orientation Provided & Reviewed with Rachael Herrera/Caregiver Including Roles  &  Responsibilities: Yes  Decrease burden of Care  through IP rehab admission: Specialzed equipment needs, Decrease number of caregivers, Bowel and bladder program and Patient/family education  Possible need for SNF placement upon discharge: not anticipated  Patient Condition: I have reviewed medical records from [facility], spoken with CM, and daughter. I met with patient at the bedside and discussed via phone for inpatient rehabilitation assessment.  Patient will benefit from ongoing Rachael Herrera, OT and SLP, can actively participate in 3 hours of therapy a day 5 days of the week, and can make measurable gains during the admission.  Patient will also benefit from the coordinated team approach during an Inpatient Acute Rehabilitation admission.  The patient will receive intensive therapy as well as Rehabilitation physician, nursing, social worker, and care management interventions.  Due to bladder management, bowel management, safety, skin/wound care, disease management, medication administration, pain management and patient education the patient requires 24 hour a day rehabilitation nursing.  The patient is currently *** with mobility and basic ADLs.  Discharge setting and therapy post discharge at [facility] IP discharge location:[id number]} is anticipated.  Patient has agreed to participate in the Acute Inpatient Rehabilitation Program and will admit {Time; today/tomorrow:10263}.  Preadmission Screen Completed By:  Genella Mech, 07/24/2020 1:54 PM ______________________________________________________________________   Discussed status with Dr. Marland Kitchen on *** at *** and received approval for admission today.  Admission Coordinator:  Genella Mech, CCC-SLP, time ***Sudie Grumbling ***   Assessment/Plan: Diagnosis: 1. Does the need for close, 24 hr/day Medical supervision in concert with the patient's rehab needs make it unreasonable for this patient to be served in a less intensive setting? {yes_no_potentially:3041433} 2. Co-Morbidities requiring  supervision/potential complications: *** 3. Due to {due GQ:6761950}, does the patient require 24 hr/day rehab nursing? {yes_no_potentially:3041433} 4. Does the patient require coordinated care of a physician, rehab nurse, Rachael Herrera, OT, and SLP to address physical and functional deficits in the context of the above medical diagnosis(es)? {yes_no_potentially:3041433} Addressing deficits in the following areas: {deficits:3041436} 5. Can the patient actively participate in an intensive therapy program of at least 3 hrs of therapy 5 days a week? {yes_no_potentially:3041433} 6. The potential for patient to make measurable gains while on inpatient rehab is {potential:3041437} 7. Anticipated functional outcomes upon discharge from inpatient rehab: {functional outcomes:304600100} Rachael Herrera, {functional outcomes:304600100} OT, {functional outcomes:304600100} SLP 8. Estimated rehab length of stay to reach the above functional goals is: *** 9. Anticipated discharge destination: {anticipated dc setting:21604} 10. Overall Rehab/Functional Prognosis: {potential:3041437}   MD Signature: ***

## 2020-07-24 NOTE — Progress Notes (Signed)
Inpatient Rehabilitation Care Coordinator Discharge Note  The overall goal for the admission was met for:   Discharge location: D/c to acute hospital due to medical reasons.   Length of Stay: 5 days  Discharge activity level: N/A  Home/community participation: N/A  Services provided included: MD, RD, PT, OT, SLP, RN, CM, TR, Pharmacy, [place]: Private Insurance: [facility] Medicare  Choices offered to/list presented to:N/A  Follow-up services arranged: N/A  Comments (or additional information):  Patient/Family verbalized understanding of follow-up arrangements: Yes  Individual responsible for coordination of the follow-up plan: pt dtr Doris [phone]  Confirmed correct DME delivered: Rana Snare 07/24/2020    Rana Snare

## 2020-07-24 NOTE — Progress Notes (Addendum)
Inpatient Rehab Admissions Coordinator:   I am following for potential readmit to CIR once medically stable. I have opened a case for insurance auth with Pt.'s insurance and await a decision.   Clemens Catholic, [place], [place] Admissions Coordinator  [phone] ([place]) [phone] (office)

## 2020-07-24 NOTE — Progress Notes (Signed)
PROGRESS NOTE    Rachael Herrera  [id number] DOB: 24-Apr-1961 DOA: 07/23/2020 PCP: Patient, No Pcp Per  Brief Narrative: 60 year old female history of hypertension, type 2 diabetes mellitus with peripheral neuropathy, remote history of hypoglycemic seizure, history of tachybradycardia syndrome was admitted on 2/17 with right-sided weakness, ataxia and left gaze preference, CTA head noted left occipital infarct with a nonocclusive thrombus septic at the left MCA bifurcation, she underwent cerebral angiogram with mechanical thrombectomy and revascularization, follow-up MRI brain noted acute ischemic infarcts in left MCA/PCA territories and a small amount of petechial blood in the left basalganglia, she developed A. fib this admission seen by EP and was started on anticoagulation, she also had episodes of bradycardia, PPM was not felt to be needed at the time, continue to have global aphasia, right-sided weakness with sensory deficits and subsequently discharged to inpatient rehab on 2/24, continued to have severe nonfluent expressive aphasia but was awake alert mute and not following commands on 3/1 she became acutely unresponsive and hemodialysis, CT head noted left occipital hemorrhage at the site of prior infarct as well as petechial hemorrhage within the site of left prior left MCA stroke, neurology was consulted, CTA head noted left MCA to be patent, EEG ordered   Assessment & Plan:   Intracranial hemorrhage, acute versus subacute Encephalopathy -Admitted from rehab following an episode of unresponsiveness, CT head noted 1.9X 1.3 cm intraparenchymal hemorrhage with mild left paramedian shift, petechial hemorrhage in the left basal ganglia at the site of previously known MCA infarct -Follow-up repeat CT head -Eliquis discontinued -EEG is unremarkable -Neurology following -PT/OT/SLP eval -Ammonia level is normal -TSH is very high, will start IV Synthroid -If mental status does not improve may  need core track/tube feeds and 24/48 hours  Bradycardia -Likely secondary to intracranial bleed -Prior history of bradycardia failed PPM insertion last admission -Appreciate EP input, PPM not felt to be indicated at this time, recommended follow-up with outpatient cardiology  Recent stroke CTA head noted left occipital infarct with a nonocclusive thrombus septic at the left MCA bifurcation, she underwent cerebral angiogram with mechanical thrombectomy and revascularization, follow-up MRI brain noted acute ischemic infarcts in left MCA/PCA territories and a small amount of petechial blood in the left basalganglia, she developed A. fib this admission seen by EP and was started on anticoagulation -Hospitalized from 2/17 through 2/24 and then sent to CIR before readmission -Will need prolonged rehabilitation  ESRD on hemodialysis -Nephrology following  Type 1 diabetes mellitus -CBG stable, continue lower dose of Lantus and sliding scale insulin  Hypothyroidism -TSH is very high, Synthroid increased and changed to IV for now  Paroxysmal atrial fibrillation -Anticoagulation on hold, as above  DVT prophylaxis: SCDs Code Status: Full code Family Communication: No family at bedside Disposition Plan:  Status is: Inpatient  Remains inpatient appropriate because:Inpatient level of care appropriate due to severity of illness   Dispo: The patient is from:CIR              Anticipated d/c is to: SNF              Patient currently is not medically stable to d/c.   Difficult to place patient No  Consultants:   Neuro  EP   Procedures:   Antimicrobials:    Subjective: -Remains poorly responsive this morning  Objective: Vitals:   07/23/20 2112 07/24/20 0107 07/24/20 0410 07/24/20 0742  BP: 128/66 134/78 (!) 124/57 (!) 126/54  Pulse: (!) 54 (!) 52 (!) 51 (!) 52  Resp: '15 19 14 15  '$ Temp: 97.6 F (36.4 C) (!) 97.2 F (36.2 C) 97.7 F (36.5 C) 98.8 F (37.1 C)  TempSrc:  Axillary Axillary Axillary Axillary  SpO2:  97% 100% 100%  Weight:   115.3 kg    No intake or output data in the 24 hours ending 07/24/20 1147 Filed Weights   07/24/20 0410  Weight: 115.3 kg    Examination:  General exam: Obese female laying in bed, poorly responsive, opens eyes, does not follow commands, EEG leads on CVS: S1-S2, regular rate rhythm Lungs: Decreased breath sounds the bases, otherwise clear Abdomen: Soft, nontender, bowel sounds present Extremities: No edema, Neuro: Obtunded, does not follow commands Psychiatry: Unable to assess    Data Reviewed:   CBC: Recent Labs  Lab 07/18/20 0321 07/19/20 0505 07/22/20 0453 07/23/20 1300 07/24/20 0303  WBC 7.4 6.4 7.8 8.2 6.8  NEUTROABS  --  3.6  --   --   --   HGB 10.7* 11.3* 12.8 11.7* 12.3  HCT 33.8* 35.9* 38.6 37.1 37.8  MCV 96.6 96.5 94.4 95.6 95.5  PLT 214 216 241 241 [id number]   Basic Metabolic Panel: Recent Labs  Lab 07/21/20 0528 07/22/20 0453 07/23/20 1300 07/23/20 1723 07/23/20 2012 07/24/20 0303  NA 137 135 133* 135  --  137  K 3.9 4.0 4.4 4.2  --  4.1  CL 96* 93* 93* 93*  --  96*  CO2 25 25 20* 26  --  27  GLUCOSE 144* 112* 268* 136*  --  89  BUN 32* 44* 61* 31*  --  32*  CREATININE 8.14* 9.88* 12.72* 8.25*  --  9.11*  CALCIUM 9.0 9.2 8.9 9.0  --  9.1  MG  --   --   --   --  2.2  --   PHOS 5.1* 5.2* 6.7*  --  4.6  --    GFR: Estimated Creatinine Clearance: 8.7 mL/min (A) (by C-G formula based on SCr of 9.11 mg/dL (H)). Liver Function Tests: Recent Labs  Lab 07/19/20 0505 07/21/20 0528 07/22/20 0453 07/23/20 1300  AST 17  --   --   --   ALT 16  --   --   --   ALKPHOS 76  --   --   --   BILITOT 0.8  --   --   --   PROT 7.0  --   --   --   ALBUMIN 3.3* 3.5 3.6 3.6   No results for input(s): LIPASE, AMYLASE in the last 168 hours. Recent Labs  Lab 07/23/20 1612  AMMONIA <9*   Coagulation Profile: No results for input(s): INR, PROTIME in the last 168 hours. Cardiac Enzymes: No  results for input(s): CKTOTAL, CKMB, CKMBINDEX, TROPONINI in the last 168 hours. BNP (last 3 results) No results for input(s): PROBNP in the last 8760 hours. HbA1C: Recent Labs    07/24/20 0303  HGBA1C 8.8*   CBG: Recent Labs  Lab 07/23/20 1120 07/23/20 1535 07/23/20 1711 07/23/20 2250 07/24/20 0759  GLUCAP 245* 107* 122* 107* 104*   Lipid Profile: Recent Labs    07/24/20 0303  CHOL 117  HDL 36*  LDLCALC 37  TRIG 222*  CHOLHDL 3.3   Thyroid Function Tests: Recent Labs    07/23/20 1612  TSH 13.148*   Anemia Panel: No results for input(s): VITAMINB12, FOLATE, FERRITIN, TIBC, IRON, RETICCTPCT in the last 72 hours. Urine analysis: No results found for: COLORURINE, APPEARANCEUR, [place], [place], [place], [place],  BILIRUBINUR, KETONESUR, PROTEINUR, UROBILINOGEN, NITRITE, LEUKOCYTESUR Sepsis Labs: '@LABRCNTIP'$ (procalcitonin:4,lacticidven:4)  )No results found for this or any previous visit (from the past 240 hour(s)).       Radiology Studies: CT ANGIO HEAD W OR WO CONTRAST  Result Date: 07/23/2020 CLINICAL DATA:  Prior stroke, bleed on CT head EXAM: CT ANGIOGRAPHY HEAD TECHNIQUE: Multidetector CT imaging of the head was performed using the standard protocol during bolus administration of intravenous contrast. Multiplanar CT image reconstructions and MIPs were obtained to evaluate the vascular anatomy. CONTRAST:  27m OMNIPAQUE IOHEXOL 350 MG/ML SOLN COMPARISON:  Correlation made with prior CT and MR imaging FINDINGS: CTA HEAD Anterior circulation: Intracranial internal carotid arteries are patent with calcified plaque causing mild stenosis. Recanalization of prior thrombus with now patent left middle cerebral artery. Improved flow within the distal left MCA territory. Right middle cerebral artery and both anterior cerebral arteries are patent. Posterior circulation: Intracranial vertebral, basilar, and posterior cerebral arteries are patent. Left posterior communicating  artery is present. Venous sinuses: Not well evaluated. Review of the MIP images confirms the above findings IMPRESSION: Left MCA is now patent.  No new abnormality. Electronically Signed   By: PMacy MisM.D.   On: 07/23/2020 17:18   CT HEAD WO CONTRAST  Addendum Date: 07/23/2020   ADDENDUM REPORT: 07/23/2020 17:06 ADDENDUM: These results were called by telephone at the time of interpretation on 07/23/2020 at 4:55 pm to provider Dr. MKathrynn Speed who verbally acknowledged these results. As discussed at this time, petechial hemorrhage was present within the medial left occipital lobe on the prior brain MRI of 07/12/2020. Electronically Signed   By: KKellie SimmeringDO   On: 07/23/2020 17:06   Result Date: 07/23/2020 CLINICAL DATA:  Mental status change, unknown cause. EXAM: CT HEAD WITHOUT CONTRAST TECHNIQUE: Contiguous axial images were obtained from the base of the skull through the vertex without intravenous contrast. COMPARISON:  Brain MRI 07/12/2020. Report from thrombectomy 07/11/2020. CT angiogram head/neck 07/11/2020. FINDINGS: Brain: Mild cerebral and cerebellar atrophy. New from prior exams, there is a 1.9 x 1.3 cm focus of acute parenchymal hemorrhage within the paramedian left occipital lobe at site of a known subacute infarct left PCA territory. Petechial hemorrhage is present within the left caudate and lentiform nuclei and within the cortex of the left frontotemporal operculum, left insula and left temporal lobe at site of a known subacute left MCA territory infarct. Minimal mass effect with subtle partial effacement of the left lateral ventricle. No midline shift. No extra-axial fluid collection. No evidence of intracranial mass. Vascular: No hyperdense vessel.  Atherosclerotic calcifications. Skull: Normal. Negative for fracture or focal lesion. Sinuses/Orbits: Rightward gaze. Moderate partial opacification of right ethmoid air cells posteriorly. Minimal partial opacification of left ethmoid  air cells. IMPRESSION: 1.9 x 1.3 cm acute parenchymal hemorrhage within the paramedian left occipital lobe at site of a known subacute left PCA territory infarct. Petechial hemorrhage within the left basal ganglia, left frontoparietal operculum, left insula and left temporal lobe at site of a known subacute left MCA territory infarct. Subtle mass effect with partial effacement of the left lateral ventricle. No midline shift. Electronically Signed: By: KKellie SimmeringDO On: 07/23/2020 16:45   EEG adult  Result Date: 07/23/2020 YLora Havens MD     07/23/2020  7:22 PM Patient Name: Rachael NASRALLAHMRN: [id number] Attending: PLora HavensReferring Physician/Provider: KClance BollDate: 07/23/2020 Duration: 24.20 mins Patient history:  60yo F with Left PCA infarct and overlying hemorrhage  noted to have transient unresponsive episode of unknown etiology. EEG to evaluate for seizure Level of alertness: Awake AEDs during EEG study: None Technical aspects: This EEG study was done with scalp electrodes positioned according to the 10-20 International system of electrode placement. Electrical activity was acquired at a sampling rate of '500Hz'$  and reviewed with a high frequency filter of '70Hz'$  and a low frequency filter of '1Hz'$ . EEG data were recorded continuously and digitally stored. Description: The posterior dominant rhythm consists of 9 Hz activity of moderate voltage (25-35 uV) seen predominantly in posterior head regions, symmetric and reactive to eye opening and eye closing. EEG showed ntermittent 3 to 6 Hz theta-delta slowing in left temporal region. Hyperventilation and photic stimulation were not performed.   ABNORMALITY -Intermittent slow, left temporal region. IMPRESSION: This study is suggestive of nonspecific left temporal dysfunction. No seizures or epileptiform discharges were seen throughout the recording. Priyanka Barbra Sarks   Overnight EEG with video  Result Date: 07/24/2020 Lora Havens, MD     07/24/2020 10:25 AM Patient Name: Rachael Herrera MRN: [id number] Epilepsy Attending: Lora Havens Referring Physician/Provider: Dr Roland Rack Duration: 07/23/2020 1839 to 07/24/2020 1000  Patient history: 60 yo F with Left PCA infarct and overlying hemorrhage noted to have transient unresponsive episode of unknown etiology. EEG to evaluate for seizure  Level of alertness: Awake, asleep  AEDs during EEG study: None  Technical aspects: This EEG study was done with scalp electrodes positioned according to the 10-20 International system of electrode placement. Electrical activity was acquired at a sampling rate of '500Hz'$  and reviewed with a high frequency filter of '70Hz'$  and a low frequency filter of '1Hz'$ . EEG data were recorded continuously and digitally stored.  Description: The posterior dominant rhythm consists of 9 Hz activity of moderate voltage (25-35 uV) seen predominantly in posterior head regions, symmetric and reactive to eye opening and eye closing.  Sleep was characterized by vertex waves, sleep spindles (12 to 14 Hz), maximal frontocentral region, REM sleep.  EEG showed intermittent 3 to 6 Hz theta-delta slowing in left temporal region. Hyperventilation and photic stimulation were not performed.    ABNORMALITY -Intermittent slow, left temporal region.  IMPRESSION: This study is suggestive of nonspecific left temporal dysfunction. No seizures or epileptiform discharges were seen throughout the recording.  Priyanka Barbra Sarks        Scheduled Meds: .  stroke: mapping our early stages of recovery book   Does not apply Once  . amitriptyline  25 mg Oral QHS  . amLODipine  10 mg Oral Daily  . atorvastatin  80 mg Oral QPM  . cholecalciferol  1,000 Units Oral Daily  . cinacalcet  60 mg Oral Q breakfast  . gabapentin  300 mg Oral Daily  . insulin aspart  0-6 Units Subcutaneous TID WC  . insulin glargine  15 Units Subcutaneous BID  . levothyroxine  50 mcg Intravenous Daily  .  losartan  50 mg Oral Daily  . sevelamer carbonate  800 mg Oral TID WC  . torsemide  20 mg Oral Daily  . vitamin B-12  2,000 mcg Oral Daily  . vitamin E  400 Units Oral Daily   Continuous Infusions:   LOS: 1 day    Time spent: 81mn  PDomenic Polite MD [facility]  07/24/2020, 11:47 AM

## 2020-07-24 NOTE — Progress Notes (Signed)
Inpatient Diabetes Program Recommendations  AACE/[name]: New Consensus Statement on Inpatient Glycemic Control (2015)  Target Ranges:  Prepandial:   less than 140 mg/dL      Peak postprandial:   less than 180 mg/dL (1-2 hours)      Critically ill patients:  140 - 180 mg/dL   Lab Results  Component Value Date   GLUCAP 104 (H) 07/24/2020   HGBA1C 8.8 (H) 07/24/2020    Review of Glycemic Control Results for NETISHA, OSHIRO (MRN [id number]) as of 07/24/2020 09:57  Ref. Range 07/23/2020 17:11 07/23/2020 22:50 07/24/2020 07:59  Glucose-Capillary Latest Ref Range: 70 - 99 mg/dL 122 (H) 107 (H) 104 (H)   Diabetes history: Type 1 DM Outpatient Diabetes medications: Humalog 3-13 units TID, Lantus 80 units QHS Current orders for Inpatient glycemic control: Novolog 0-6 units TID, Lantus 40 units QHS  Inpatient Diabetes Program Recommendations:    Patient is Type 1 DM. Given doses while inpatient in rehab and current renal status would:  Consider:  -Decreasing Lantus to 15 units BID -Adding Novolog 2 units TID (assuming patient is consuming >50% of meals)  Thanks, Bronson Curb, MSN, RNC-OB Diabetes Coordinator [phone] (8a-5p)

## 2020-07-24 NOTE — Evaluation (Signed)
Clinical/Bedside Swallow Evaluation Patient Details  Name: Rachael Herrera MRN: [id number] Date of Birth: [birth year]  Today's Date: 07/24/2020 Time: SLP Start Time (ACUTE ONLY): 1406 SLP Stop Time (ACUTE ONLY): 1416 SLP Time Calculation (min) (ACUTE ONLY): 10 min  Past Medical History:  Past Medical History:  Diagnosis Date  . Cavitary pneumonia 06/2017   due to MSSA  . Claudication of both lower extremities ([place])   . DDD (degenerative disc disease), cervical   . DDD (degenerative disc disease), lumbosacral   . Diabetes mellitus ([place])   . Diabetic neuropathy ([place])   . ESRD (end stage renal disease) on dialysis ([place])   . GERD (gastroesophageal reflux disease)   . NSVT (nonsustained ventricular tachycardia) ([place]) 10/20201  . OSA on CPAP   . Stroke ([place])   . Ulnar neuropathy    BUE  . Vitamin B 12 deficiency    Past Surgical History:  Past Surgical History:  Procedure Laterality Date  . IR ANGIO VERTEBRAL SEL VERTEBRAL UNI L MOD SED  07/11/2020  . IR CT HEAD LTD  07/11/2020  . IR PERCUTANEOUS ART THROMBECTOMY/INFUSION INTRACRANIAL INC DIAG ANGIO  07/11/2020  . IR US GUIDE VASC ACCESS LEFT  07/12/2020  . RADIOLOGY WITH ANESTHESIA N/A 07/11/2020   Procedure: IR WITH ANESTHESIA;  Surgeon: Radiologist, Medication, MD;  Location: [place];  Service: Radiology;  Laterality: N/A;   HPI:  60 yo female presenting with new L occip and basal ganglia hemorrhages, after recent L MCA and PCA infarcts.  Was in CIR when bradycardia and AMS occurred, and MD then found her expanded cerebral changes but no seizure activity on EEG. Pt was being followed for aphasaia, apraxia, and dysphagia while on CIR (most recently on Dys 2 diet and thin liquids). PMHx:   thrombectomy 2/17. DM II, OSA, CKD IV, HTN, HLD, and morbid obesity.   Assessment / Plan / Recommendation Clinical Impression  Upon SLP arrival, pt was found eating chips in bed in a semi-reclined position. She is impulsive with self-feeding and not  attentive to buccal pocketing accumulating. Immediate coughing was also noted x2 in this position: once with thin liquids and once while masticating pineapple. SLP provided repositioning and Mod cues for oral clearance throughout intake with no further overt s/s of aspiration. Will adjust diet to mechanical soft solids, although anticipate that pt will need assistance during meals for safe positioning and pacing. Education was reinforced to family about diet recommendation and precautions. She would benefit from return to CIR for additional therapy. SLP Visit Diagnosis: Dysphagia, unspecified (R13.10);Aphasia (R47.01)    Aspiration Risk  Mild aspiration risk    Diet Recommendation Dysphagia 3 (Mech soft);Thin liquid   Liquid Administration via: Cup;Straw Medication Administration: Whole meds with puree Supervision: Staff to assist with self feeding;Full supervision/cueing for compensatory strategies Compensations: Minimize environmental distractions;Slow rate;Small sips/bites;Lingual sweep for clearance of pocketing Postural Changes: Seated upright at 90 degrees    Other  Recommendations Oral Care Recommendations: Oral care BID   Follow up Recommendations Inpatient Rehab      Frequency and Duration min 2x/week  2 weeks       Prognosis Prognosis for Safe Diet Advancement: Good Barriers to Reach Goals: Cognitive deficits;Language deficits      Swallow Study   General HPI: 60 yo female presenting with new L occip and basal ganglia hemorrhages, after recent L MCA and PCA infarcts.  Was in CIR when bradycardia and AMS occurred, and MD then found her expanded cerebral changes but no seizure  activity on EEG. Pt was being followed for aphasaia, apraxia, and dysphagia while on CIR (most recently on Dys 2 diet and thin liquids). PMHx:   thrombectomy 2/17. DM II, OSA, CKD IV, HTN, HLD, and morbid obesity. Type of Study: Bedside Swallow Evaluation Previous Swallow Assessment: BSE 2/18 and  2/25 Diet Prior to this Study: Regular;Thin liquids Temperature Spikes Noted: No Respiratory Status: Nasal cannula History of Recent Intubation: No Behavior/Cognition: Alert;Impulsive;Requires cueing;Distractible Oral Cavity Assessment:  (?lingual coating) Oral Care Completed by SLP: No Oral Cavity - Dentition: Adequate natural dentition Vision: Functional for self-feeding Self-Feeding Abilities: Able to feed self Patient Positioning: Upright in bed Baseline Vocal Quality: Normal    Oral/Motor/Sensory Function     Ice Chips Ice chips: Within functional limits Presentation: Cup   Thin Liquid Thin Liquid: Impaired Presentation: Cup;Self Fed Pharyngeal  Phase Impairments: Cough - Immediate    Nectar Thick Nectar Thick Liquid: Not tested   Honey Thick Honey Thick Liquid: Not tested   Puree Puree: Not tested   Solid     Solid: Impaired Presentation: Self Fed Oral Phase Functional Implications: Right lateral sulci pocketing;Left lateral sulci pocketing Pharyngeal Phase Impairments: Cough - Immediate       [name], M.A. [facility] Pager [phone] Office [phone]  07/24/2020,3:14 PM

## 2020-07-24 NOTE — Evaluation (Signed)
Speech Language Pathology Evaluation Patient Details Name: Rachael Herrera MRN: [id number] DOB: 1961-02-14 Today's Date: 07/24/2020 Time: [id number] SLP Time Calculation (min) (ACUTE ONLY): 13 min  Problem List:  Patient Active Problem List   Diagnosis Date Noted  . ESRD on dialysis ([place]) 07/23/2020  . Bradycardia 07/23/2020  . 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  . Acute ischemic left MCA stroke ([place]) 07/18/2020  . Cerebral embolism with cerebral infarction 07/11/2020  . Stroke ([place]) 07/11/2020  . Comprehensive diabetic foot examination, type 2 DM, encounter for ([place]) 09/25/2019  . OSA (obstructive sleep apnea) 11/21/2018  . Acquired hypothyroidism 05/04/2014  . Chronic kidney disease, stage IV (severe) ([place]) 05/04/2014  . Essential hypertension 05/04/2014  . Hyperlipidemia due to type 1 diabetes mellitus ([place]) 05/04/2014  . Obesity 05/04/2014   Past Medical History:  Past Medical History:  Diagnosis Date  . Cavitary pneumonia 06/2017   due to MSSA  . Claudication of both lower extremities ([place])   . DDD (degenerative disc disease), cervical   . DDD (degenerative disc disease), lumbosacral   . Diabetes mellitus ([place])   . Diabetic neuropathy ([place])   . ESRD (end stage renal disease) on dialysis ([place])   . GERD (gastroesophageal reflux disease)   . NSVT (nonsustained ventricular tachycardia) ([place]) 10/20201  . OSA on CPAP   . Stroke ([place])   . Ulnar neuropathy    BUE  . Vitamin B 12 deficiency    Past Surgical History:  Past Surgical History:  Procedure Laterality Date  . IR ANGIO VERTEBRAL SEL VERTEBRAL UNI L MOD SED  07/11/2020  . IR CT HEAD LTD  07/11/2020  . IR PERCUTANEOUS ART THROMBECTOMY/INFUSION INTRACRANIAL INC DIAG ANGIO  07/11/2020  . IR US GUIDE VASC ACCESS LEFT  07/12/2020  . RADIOLOGY WITH ANESTHESIA N/A 07/11/2020   Procedure: IR WITH ANESTHESIA;  Surgeon: Radiologist, Medication, MD;  Location: [facility];  Service: Radiology;  Laterality: N/A;   HPI:   60 yo female presenting with new L occip and basal ganglia hemorrhages, after recent L MCA and PCA infarcts.  Was in CIR when bradycardia and AMS occurred, and MD then found her expanded cerebral changes but no seizure activity on EEG. Pt was being followed for aphasaia, apraxia, and dysphagia while on CIR (most recently on Dys 2 diet and thin liquids). PMHx:   thrombectomy 2/17. DM II, OSA, CKD IV, HTN, HLD, and morbid obesity.   Assessment / Plan / Recommendation Clinical Impression  Pt presents with significant cognitive and communicative deficits that are impaired from her baseline, but also impaired in comparison to more recent presentation while on CIR per daughter present. Pt continues to present with expressive more than receptive difficulties, with verbal output becoming more fluent but comprised of neologisms and jargon so that what she says is not comprehensible. She says "yeah" in response to all yes/no questions. Throughout testing pt was also noted to be quite impulsive and distrctible with decreased safety awareness. Recommend CIR level follow up to maximize safety.    SLP Assessment  SLP Recommendation/Assessment: Patient needs continued Speech Lanaguage Pathology Services SLP Visit Diagnosis: Dysphagia, unspecified (R13.10);Aphasia (R47.01)    Follow Up Recommendations  Inpatient Rehab    Frequency and Duration min 2x/week  2 weeks      SLP Evaluation Cognition  Overall Cognitive Status: Impaired/Different from baseline Arousal/Alertness: Awake/alert Attention: Sustained Sustained Attention: Impaired Sustained Attention Impairment: Verbal basic;Functional basic Awareness: Impaired Awareness Impairment: Emergent impairment Safety/Judgment: Impaired  Comprehension  Auditory Comprehension Overall Auditory Comprehension: Impaired Yes/No Questions: Impaired Basic Biographical Questions:  (answers "yea" to all questions) Commands: Impaired One Step Basic Commands:  25-49% accurate Interfering Components: Attention    Expression Expression Primary Mode of Expression: Verbal Verbal Expression Overall Verbal Expression: Impaired Initiation: No impairment Level of Generative/Spontaneous Verbalization: Word Naming: Impairment Confrontation: Impaired Verbal Errors: Neologisms;Jargon;Not aware of errors Non-Verbal Means of Communication: Not applicable Written Expression Dominant Hand: Right   Oral / Motor  Motor Speech Overall Motor Speech: Appears within functional limits for tasks assessed   GO                    [name], M.A. [place] Acute Rehabilitation Services Pager [phone] Office [phone]  07/24/2020, 4:28 PM

## 2020-07-24 NOTE — Evaluation (Signed)
Occupational Therapy Evaluation Patient Details Name: Rachael Herrera MRN: [id number] DOB: Aug 16, 1960 Today's Date: 07/24/2020    History of Present Illness 60 yo female presenting with new L occip and basal ganglia hemorrhages, after recent L MCA and PCA infarcts.  Was in CIR when bradycardia and AMS occurred, and MD then found her expanded cerebral changesbut no seizure activity on EEG.  Prev and current R side weakness, L gaze preference and R side mild incoordination.  Pt is delayed in motor responses, aphasic and will use yes answer when this is not accurate.    PMHx:   thrombectomy 2/17. DM II, OSA, CKD IV, HTN, HLD, and morbid obesity.   Clinical Impression   Pt functions independently at her baseline. Presents with impaired cognition, R inattention, aphasia and decreased standing balance. Pt requires up to min assist for OOB and min to mod assist for ADL. Per daughter, pt is not back to her baseline in cognition of that prior to return to the acute care unit. Recommend return to CIR for further rehab. Will follow acutely.     Follow Up Recommendations  CIR    Equipment Recommendations  3 in 1 bedside commode;Other (comment)    Recommendations for Other Services       Precautions / Restrictions Precautions Precautions: Fall Precaution Comments: R inattention Restrictions Weight Bearing Restrictions: No      Mobility Bed Mobility Overal bed mobility: Needs Assistance Bed Mobility: Supine to Sit;Sit to Supine Rolling: Min guard   Supine to sit: Min guard Sit to supine: Min guard   General bed mobility comments: no physical assist, increased time    Transfers Overall transfer level: Needs assistance Equipment used: 1 person hand held assist Transfers: Sit to/from Stand Sit to Stand: Min assist Stand pivot transfers: Min assist       General transfer comment: steadying assist    Balance Overall balance assessment: Needs assistance Sitting-balance support: Feet  supported Sitting balance-Leahy Scale: Fair     Standing balance support: Bilateral upper extremity supported;During functional activity Standing balance-Leahy Scale: Poor Standing balance comment: at least one hand support for balance                           ADL either performed or assessed with clinical judgement   ADL Overall ADL's : Needs assistance/impaired Eating/Feeding: Minimal assistance;Sitting Eating/Feeding Details (indicate cue type and reason): finger feeding with R hand Grooming: Wash/dry hands;Standing;Minimal assistance Grooming Details (indicate cue type and reason): verbal cues for thoroughness and sequencing Upper Body Bathing: Moderate assistance;Sitting   Lower Body Bathing: Moderate assistance;Sit to/from stand   Upper Body Dressing : Minimal assistance;Sitting   Lower Body Dressing: Moderate assistance;Sit to/from stand   Toilet Transfer: Minimal assistance;Ambulation   Toileting- Clothing Manipulation and Hygiene: Moderate assistance;Sit to/from Sports administrator Perception Deficits: Inattention/neglect Inattention/Neglect:  (to R side) Comments: tracks therapist with eyes across midline to R   Praxis      Pertinent Vitals/Pain Pain Assessment: Faces Faces Pain Scale: No hurt     Hand Dominance Right   Extremity/Trunk Assessment Upper Extremity Assessment Upper Extremity Assessment: RUE deficits/detail RUE Deficits / Details: using R UE to reach across midline for food on tray, holding bank card in hand RUE Coordination: decreased fine motor;decreased gross motor   Lower Extremity Assessment Lower Extremity  Assessment: RLE deficits/detail RLE Deficits / Details: 4/5 strength with slow motor responses, reinstruction needed to complete tasks RLE Sensation: history of peripheral neuropathy (locates touches to limb correctly) RLE Coordination: decreased gross motor   Cervical /  Trunk Assessment Cervical / Trunk Assessment: Other exceptions Cervical / Trunk Exceptions: forward flexed sitting and standing postures   Communication Communication Communication: Receptive difficulties;Expressive difficulties   Cognition Arousal/Alertness: Awake/alert Behavior During Therapy: Impulsive;Flat affect Overall Cognitive Status: Impaired/Different from baseline Area of Impairment: Problem solving;Awareness;Safety/judgement;Following commands;Memory;Attention;Orientation                 Orientation Level: Time;Situation Current Attention Level: Selective Memory: Decreased short-term memory;Decreased recall of precautions Following Commands: Follows one step commands inconsistently;Follows one step commands with increased time Safety/Judgement: Decreased awareness of safety;Decreased awareness of deficits Awareness: Intellectual Problem Solving: Slow processing;Requires verbal cues;Requires tactile cues General Comments: pt requiring encouragement to give her bank card an purse to her daughter for safe keeping, refused to give daughter her cell phone   General Comments  Pt is up to stand and then with help sidesteps on side of bed.  Has reliance on L side and inattention to use of RUE esp to control grip and move efficiently to R side    Exercises     Shoulder Instructions      Home Living Family/patient expects to be discharged to:: Private residence Living Arrangements: Children Available Help at Discharge: Family Type of Home: House Home Access: Stairs to enter Technical brewer of Steps: 8 Entrance Stairs-Rails: Can reach both;Right;Left Home Layout: One level     Bathroom Shower/Tub: Occupational psychologist: Handicapped height Bathroom Accessibility: Yes How Accessible: Accessible via walker Home Equipment: None   Additional Comments: per chart, pt with aphasia  Lives With: Daughter    Prior Functioning/Environment Level of  Independence: Independent        Comments: drove independently        OT Problem List: Decreased strength;Decreased range of motion;Decreased activity tolerance;Impaired balance (sitting and/or standing);Impaired vision/perception;Decreased coordination;Decreased cognition;Decreased safety awareness;Decreased knowledge of use of DME or AE;Decreased knowledge of precautions;Cardiopulmonary status limiting activity;Impaired sensation;Obesity;Impaired UE functional use      OT Treatment/Interventions: Self-care/ADL training;Neuromuscular education;Energy conservation;DME and/or AE instruction;Manual therapy;Therapeutic activities;Cognitive remediation/compensation;Visual/perceptual remediation/compensation;Patient/family education;Balance training    OT Goals(Current goals can be found in the care plan section) Acute Rehab OT Goals Patient Stated Goal: family wants return to CIR, pt in agreement OT Goal Formulation: With patient/family Time For Goal Achievement: 08/07/20 Potential to Achieve Goals: Good ADL Goals Pt Will Perform Eating: with supervision;sitting Pt Will Perform Grooming: standing;with supervision Pt Will Perform Upper Body Dressing: (P) with supervision;sitting Pt Will Perform Lower Body Dressing: (P) with supervision;sit to/from stand Pt Will Transfer to Toilet: (P) ambulating;with min guard assist Pt Will Perform Toileting - Clothing Manipulation and hygiene: (P) with min guard assist;sit to/from stand Additional ADL Goal #1: Pt will use R UE as lead during 2/3 ADL.  OT Frequency: Min 2X/week   Barriers to D/C:            Co-evaluation              AM-PAC OT "6 Clicks" Daily Activity     Outcome Measure Help from another person eating meals?: A Little Help from another person taking care of personal grooming?: A Little Help from another person toileting, which includes using toliet, bedpan, or urinal?: A Lot Help from another person bathing (including  washing, rinsing, drying)?: A  Lot Help from another person to put on and taking off regular upper body clothing?: A Little Help from another person to put on and taking off regular lower body clothing?: A Lot 6 Click Score: 15   End of Session Equipment Utilized During Treatment: Gait belt  Activity Tolerance: Patient tolerated treatment well Patient left: in bed;with call bell/phone within reach;with bed alarm set  OT Visit Diagnosis: Unsteadiness on feet (R26.81);Muscle weakness (generalized) (M62.81);Cognitive communication deficit (R41.841);Hemiplegia and hemiparesis Symptoms and signs involving cognitive functions: Cerebral infarction Hemiplegia - Right/Left: Right Hemiplegia - dominant/non-dominant: Dominant Hemiplegia - caused by: Cerebral infarction                Time: 1455-1516 OT Time Calculation (min): 21 min Charges:  OT General Charges $OT Visit: 1 Visit OT Evaluation $OT Eval Moderate Complexity: 1 Mod  Nestor Lewandowsky, OTR/L Acute Rehabilitation Services Pager: [phone] Office: [phone] Malka So 07/24/2020, 3:46 PM

## 2020-07-25 ENCOUNTER — Inpatient Hospital Stay (HOSPITAL_COMMUNITY): Payer: Medicare Other

## 2020-07-25 DIAGNOSIS — Z8659 Personal history of other mental and behavioral disorders: Secondary | ICD-10-CM

## 2020-07-25 LAB — CBC
HCT: 36.9 % (ref 36.0–46.0)
Hemoglobin: 11.5 g/dL — ABNORMAL LOW (ref 12.0–15.0)
MCH: 30.2 pg (ref 26.0–34.0)
MCHC: 31.2 g/dL (ref 30.0–36.0)
MCV: 96.9 fL (ref 80.0–100.0)
Platelets: 270 10*3/uL (ref 150–400)
RBC: 3.81 MIL/uL — ABNORMAL LOW (ref 3.87–5.11)
RDW: 14.9 % (ref 11.5–15.5)
WBC: 6.7 10*3/uL (ref 4.0–10.5)
nRBC: 0 % (ref 0.0–0.2)

## 2020-07-25 LAB — RENAL FUNCTION PANEL
Albumin: 3.4 g/dL — ABNORMAL LOW (ref 3.5–5.0)
Anion gap: 14 (ref 5–15)
BUN: 49 mg/dL — ABNORMAL HIGH (ref 8–23)
CO2: 22 mmol/L (ref 22–32)
Calcium: 8.8 mg/dL — ABNORMAL LOW (ref 8.9–10.3)
Chloride: 93 mmol/L — ABNORMAL LOW (ref 98–111)
Creatinine, Ser: 11.54 mg/dL — ABNORMAL HIGH (ref 0.44–1.00)
GFR, Estimated: 3 mL/min — ABNORMAL LOW (ref >60)
Glucose, Bld: 286 mg/dL — ABNORMAL HIGH (ref 70–99)
Phosphorus: 6.8 mg/dL — ABNORMAL HIGH (ref 2.5–4.6)
Potassium: 5 mmol/L (ref 3.5–5.1)
Sodium: 129 mmol/L — ABNORMAL LOW (ref 135–145)

## 2020-07-25 LAB — GLUCOSE, CAPILLARY
Glucose-Capillary: 317 mg/dL — ABNORMAL HIGH (ref 70–99)
Glucose-Capillary: 158 mg/dL — ABNORMAL HIGH (ref 70–99)
Glucose-Capillary: 354 mg/dL — ABNORMAL HIGH (ref 70–99)
Glucose-Capillary: 359 mg/dL — ABNORMAL HIGH (ref 70–99)

## 2020-07-25 MED ORDER — INSULIN GLARGINE 100 UNIT/ML ~~LOC~~ SOLN
30.0000 [IU] | Freq: Two times a day (BID) | SUBCUTANEOUS | Status: DC
  Administered 2020-07-25: 30 [IU] via SUBCUTANEOUS
  Filled 2020-07-25 (×3): qty 0.3

## 2020-07-25 MED ORDER — LEVOTHYROXINE SODIUM 100 MCG PO TABS
100.0000 ug | ORAL_TABLET | Freq: Every day | ORAL | Status: DC
  Administered 2020-07-25 – 2020-08-01 (×8): 100 ug via ORAL
  Filled 2020-07-25 (×8): qty 1

## 2020-07-25 NOTE — Plan of Care (Signed)
  Problem: Education: Goal: Knowledge of General Education information will improve Description Including pain rating scale, medication(s)/side effects and non-pharmacologic comfort measures Outcome: Progressing   

## 2020-07-25 NOTE — Progress Notes (Signed)
Inpatient Diabetes Program Recommendations  AACE/[name]: New Consensus Statement on Inpatient Glycemic Control (2015)  Target Ranges:  Prepandial:   less than 140 mg/dL      Peak postprandial:   less than 180 mg/dL (1-2 hours)      Critically ill patients:  140 - 180 mg/dL   Lab Results  Component Value Date   GLUCAP 317 (H) 07/25/2020   HGBA1C 8.8 (H) 07/24/2020    Review of Glycemic Control Results for Rachael Herrera, Rachael Herrera (MRN [id number]) as of 07/25/2020 08:19  Ref. Range 07/24/2020 11:56 07/24/2020 16:59 07/24/2020 21:25 07/25/2020 06:27  Glucose-Capillary Latest Ref Range: 70 - 99 mg/dL 123 (H) 407 (H) 268 (H) 317 (H)   Diabetes history: Type 1 DM (requires basal & meal coverage) Outpatient Diabetes medications: Humalog 3-13 units TID, Lantus 80 units QHS Current orders for Inpatient glycemic control: Novolog 0-6 units TID, Lantus 24 units BID  Inpatient Diabetes Program Recommendations:    Noted order changes due to elevated glucose.   Of note, patient did not receive AM dose of Lantus yesterday 3/2 and had a meal/chips (per speech's note) without carb coverage. Assuming this to explain CBG of 407 mg/dL. Will follow given changes. Anticipate need for meal coverage and further adjustments to Lantus given renal status.  Thanks, Bronson Curb, MSN, RNC-OB Diabetes Coordinator [phone] (8a-5p)

## 2020-07-25 NOTE — Progress Notes (Signed)
Subjective: I saw her near the beginning of her dialysis this morning.  She looks similar to the way I saw her yesterday, improved from when my nurse practitioner saw her yesterday morning.  Exam: Vitals:   07/25/20 1025 07/25/20 1030  BP: (!) 125/44 (!) 106/46  Pulse: (!) 53 (!) 59  Resp:    Temp:    SpO2: 100%    Gen: In bed, NAD Resp: non-labored breathing, no acute distress Abd: soft, nt  Neuro: MS: Awake, alert, she has a significant global aphasia, though is able to follow some commands.  She does count fingers at times, though not reliably. CN: Blinks to threat bilaterally, EOMI Motor: She has a right hemiparesis, 4/5 on the right, 5/5 on the left Sensory: Endorses sensation bilaterally  Pertinent Labs: Cortisol 12.2 at 8 PM Ammonia less than nine TSH is elevated, recently started on levothyroxine  Impression: 60 year old with acute mental status change during dialysis.  She does have some hemorrhagic transformation of her stroke, but this was present on her MRI several weeks ago, just more confluent in the posterior aspect.  The CT yesterday demonstrated enhancement of the entire stroke bed, but I do not feel that this was worsening petechial hemorrhage as it was more consistent with contrast enhancement.  I do not think the imaging findings are related to her mental status change.  With the daughters report that she often becomes lethargic after dialysis, I think it was more likely recrudescence of her previous symptoms in that setting.  If it continues, however, may need to consider seizure as an etiology but I have lower suspicion for that at this time.  Recommendations: 1) repeat head CT today 2) would continue holding Eliquis for now.  Roland Rack, MD [facility] [phone]  If 7pm- 7am, please page neurology on call as listed in [place].

## 2020-07-25 NOTE — Progress Notes (Signed)
PT Cancellation Note  Patient Details Name: Rachael Herrera MRN: [id number] DOB: 05-18-1961   Cancelled Treatment:    Reason Eval/Treat Not Completed: Patient at procedure or test/unavailable. Pt in HD. Will continue to follow.   [place] 07/25/2020, 8:30 AM [name] Pager [phone] Office [phone]

## 2020-07-25 NOTE — Progress Notes (Signed)
Physical Therapy Treatment Patient Details Name: Rachael Herrera MRN: [id number] DOB: 1960-10-14 Today's Date: 07/25/2020    History of Present Illness Pt readmitted to acute from CIR on 07/23/20 after with acute mental status change during dialysis. Was in [place] for recent L MCA and PCA infarcts.  Initially thought to have new hemorrhagic stroke but neuro reports the following. - "She does have some hemorrhagic transformation of her stroke, but this was present on her MRI several weeks ago, just more confluent in the posterior aspect.  The CT 3/1 demonstrated enhancement of the entire stroke bed, but I do not feel that this was worsening petechial hemorrhage as it was more consistent with contrast enhancement.    PMHx:   thrombectomy 2/17. DM II, OSA, CKD IV, HTN, HLD, and morbid obesity.    PT Comments    Pt with good progress with mobility. Continues to have cognitive deficits as well. Recommend return to rehab for further therapy.    Follow Up Recommendations  CIR     Equipment Recommendations  None recommended by PT    Recommendations for Other Services       Precautions / Restrictions Precautions Precautions: Fall Precaution Comments: R inattention Restrictions Weight Bearing Restrictions: No    Mobility  Bed Mobility Overal bed mobility: Needs Assistance Bed Mobility: Supine to Sit     Supine to sit: Min guard     General bed mobility comments: Assist for safety.    Transfers Overall transfer level: Needs assistance Equipment used: Rolling walker (2 wheeled) Transfers: Sit to/from Stand Sit to Stand: Min assist         General transfer comment: Assist for balance and verbal cues for hand placement  Ambulation/Gait Ambulation/Gait assistance: Min assist Gait Distance (Feet): 175 Feet Assistive device: Rolling walker (2 wheeled) Gait Pattern/deviations: Step-through pattern;Decreased stride length;Drifts right/left;Trunk flexed Gait velocity: decr Gait velocity  interpretation: 1.31 - 2.62 ft/sec, indicative of limited community ambulator General Gait Details: Assist for balance. Assist to avoid obstacles on the right.   Stairs             Wheelchair Mobility    Modified Rankin (Stroke Patients Only) Modified Rankin (Stroke Patients Only) Pre-Morbid Rankin Score: No significant disability Modified Rankin: Moderately severe disability     Balance Overall balance assessment: Needs assistance Sitting-balance support: Feet supported Sitting balance-Leahy Scale: Fair     Standing balance support: Bilateral upper extremity supported Standing balance-Leahy Scale: Poor Standing balance comment: walker and min guard for static standing                            Cognition Arousal/Alertness: Awake/alert Behavior During Therapy: Impulsive;Flat affect Overall Cognitive Status: Difficult to assess Area of Impairment: Problem solving;Awareness;Safety/judgement;Following commands;Memory;Attention;Orientation                   Current Attention Level: Selective Memory: Decreased short-term memory;Decreased recall of precautions Following Commands: Follows one step commands inconsistently;Follows one step commands with increased time Safety/Judgement: Decreased awareness of safety;Decreased awareness of deficits Awareness: Intellectual Problem Solving: Slow processing;Requires verbal cues;Requires tactile cues        Exercises      General Comments        Pertinent Vitals/Pain Pain Assessment: Faces Faces Pain Scale: No hurt    Home Living                      Prior Function  PT Goals (current goals can now be found in the care plan section) Acute Rehab PT Goals Patient Stated Goal: family wants return to CIR, pt in agreement Progress towards PT goals: Progressing toward goals    Frequency    Min 4X/week      PT Plan Current plan remains appropriate    Co-evaluation               AM-PAC PT "6 Clicks" Mobility   Outcome Measure  Help needed turning from your back to your side while in a flat bed without using bedrails?: A Little Help needed moving from lying on your back to sitting on the side of a flat bed without using bedrails?: A Little Help needed moving to and from a bed to a chair (including a wheelchair)?: A Little Help needed standing up from a chair using your arms (e.g., wheelchair or bedside chair)?: A Little Help needed to walk in hospital room?: A Little Help needed climbing 3-5 steps with a railing? : A Lot 6 Click Score: 17    End of Session Equipment Utilized During Treatment: Gait belt Activity Tolerance: Patient tolerated treatment well Patient left: in chair;with call bell/phone within reach;with chair alarm set Nurse Communication: Mobility status PT Visit Diagnosis: Muscle weakness (generalized) (M62.81);Other abnormalities of gait and mobility (R26.89);Unsteadiness on feet (R26.81);Difficulty in walking, not elsewhere classified (R26.2);Hemiplegia and hemiparesis Hemiplegia - dominant/non-dominant: Dominant Hemiplegia - caused by: Nontraumatic intracerebral hemorrhage     Time: 1200-1223 PT Time Calculation (min) (ACUTE ONLY): 23 min  Charges:  $Gait Training: 23-37 mins                     [place] Pager [phone] Office [place] 07/25/2020, 3:00 PM

## 2020-07-25 NOTE — Progress Notes (Signed)
PROGRESS NOTE    Rachael Herrera  [id number] DOB: [birth year] DOA: 07/23/2020 PCP: Patient, No Pcp Per  Brief Narrative: 60 year old female history of hypertension, type 2 diabetes mellitus with peripheral neuropathy, remote history of hypoglycemic seizure, history of tachybradycardia syndrome was admitted on 2/17 with right-sided weakness, ataxia and left gaze preference, CTA head noted left occipital infarct with a nonocclusive thrombus septic at the left MCA bifurcation, she underwent cerebral angiogram with mechanical thrombectomy and revascularization, follow-up MRI brain noted acute ischemic infarcts in left MCA/PCA territories and a small amount of petechial blood in the left basalganglia, she developed A. fib this admission seen by EP and was started on anticoagulation, she also had episodes of bradycardia, PPM was not felt to be needed at the time, continue to have global aphasia, right-sided weakness with sensory deficits and subsequently discharged to inpatient rehab on 2/24, continued to have severe nonfluent expressive aphasia but was awake alert mute and not following commands on 3/1 she became acutely unresponsive and hemodialysis, CT head noted left occipital hemorrhage at the site of prior infarct as well as petechial hemorrhage within the site of left prior left MCA stroke, neurology was consulted, CTA head noted left MCA to be patent, EEG ordered   Assessment & Plan:   Intracranial hemorrhage, acute versus subacute Encephalopathy -Admitted from rehab following an episode of unresponsiveness, CT head noted 1.9X 1.3 cm intraparenchymal hemorrhage with mild left paramedian shift, petechial hemorrhage in the left basal ganglia at the site of previously known MCA infarct -Follow-up CT head with some progression of the hemorrhage which is likely contrast, discussed with neuro MD -Eliquis discontinued -EEG is unremarkable -Mental status has improved -Appreciate neurology  input -PT/OT/SLP eval -Ammonia level is normal -TSH is very high, will start IV Synthroid -Discharge back to CIR in 1 to 2 days if she remains stable  Bradycardia -Likely secondary to intracranial bleed -Prior history of bradycardia failed PPM insertion last admission -Appreciate EP input, PPM not felt to be indicated at this time, recommended follow-up with outpatient cardiology  Recent stroke CTA head noted left occipital infarct with a nonocclusive thrombus septic at the left MCA bifurcation, she underwent cerebral angiogram with mechanical thrombectomy and revascularization, follow-up MRI brain noted acute ischemic infarcts in left MCA/PCA territories and a small amount of petechial blood in the left basalganglia, she developed A. fib this admission seen by EP and was started on anticoagulation -Hospitalized from 2/17 through 2/24 and then sent to CIR before readmission -Will need prolonged rehabilitation  ESRD on hemodialysis -Nephrology following  Type 1 diabetes mellitus -CBG stable, continue lower dose of Lantus and sliding scale insulin  Hypothyroidism -TSH is very high, Synthroid increased and changed to IV for now  Paroxysmal atrial fibrillation -Anticoagulation on hold, as above  DVT prophylaxis: SCDs Code Status: Full code Family Communication: No family at bedside Disposition Plan:  Status is: Inpatient  Remains inpatient appropriate because:Inpatient level of care appropriate due to severity of illness   Dispo: The patient is from:CIR              Anticipated d/c is to: CIR              Patient currently is not medically stable to d/c.   Difficult to place patient No  Consultants:   Neuro  EP   Procedures:   Antimicrobials:    Subjective: -Feels better, seen after dialysis  Objective: Vitals:   07/25/20 1000 07/25/20 1025 07/25/20 1030 07/25/20 1149  BP: (!) 109/44 (!) 125/44 (!) 106/46 (!) 101/52  Pulse: (!) 53 (!) 53 (!) 59 (!) 52  Resp:     12  Temp:    97.9 F (36.6 C)  TempSrc:    Oral  SpO2:  100%  97%  Weight:  110.7 kg      Intake/Output Summary (Last 24 hours) at 07/25/2020 1549 Last data filed at 07/25/2020 1025 Gross per 24 hour  Intake -  Output 1000 ml  Net -1000 ml   Filed Weights   07/25/20 0553 07/25/20 0700 07/25/20 1025  Weight: 111.6 kg 111.6 kg 110.7 kg    Examination:  General exam: Obese pleasant female laying in bed, awake alert, oriented to self, communicating more but with expressive aphasia CVS: S1-S2, regular rate rhythm Lungs: Decreased breath sounds to bases otherwise clear Abdomen: Soft, nontender, bowel sounds present Extremities: No edema, left arm AV fistula noted Neuro: Mild right leg weakness, 4/5 on the right  Psychiatry: Flat affect    Data Reviewed:   CBC: Recent Labs  Lab 07/19/20 0505 07/22/20 0453 07/23/20 1300 07/24/20 0303 07/25/20 0726  WBC 6.4 7.8 8.2 6.8 6.7  NEUTROABS 3.6  --   --   --   --   HGB 11.3* 12.8 11.7* 12.3 11.5*  HCT 35.9* 38.6 37.1 37.8 36.9  MCV 96.5 94.4 95.6 95.5 96.9  PLT 216 241 241 272 [id number]   Basic Metabolic Panel: Recent Labs  Lab 07/21/20 0528 07/22/20 0453 07/23/20 1300 07/23/20 1723 07/23/20 2012 07/24/20 0303 07/25/20 0726  NA 137 135 133* 135  --  137 129*  K 3.9 4.0 4.4 4.2  --  4.1 5.0  CL 96* 93* 93* 93*  --  96* 93*  CO2 25 25 20* 26  --  27 22  GLUCOSE 144* 112* 268* 136*  --  89 286*  BUN 32* 44* 61* 31*  --  32* 49*  CREATININE 8.14* 9.88* 12.72* 8.25*  --  9.11* 11.54*  CALCIUM 9.0 9.2 8.9 9.0  --  9.1 8.8*  MG  --   --   --   --  2.2  --   --   PHOS 5.1* 5.2* 6.7*  --  4.6  --  6.8*   GFR: Estimated Creatinine Clearance: 6.7 mL/min (A) (by C-G formula based on SCr of 11.54 mg/dL (H)). Liver Function Tests: Recent Labs  Lab 07/19/20 0505 07/21/20 0528 07/22/20 0453 07/23/20 1300 07/25/20 0726  AST 17  --   --   --   --   ALT 16  --   --   --   --   ALKPHOS 76  --   --   --   --   BILITOT 0.8  --    --   --   --   PROT 7.0  --   --   --   --   ALBUMIN 3.3* 3.5 3.6 3.6 3.4*   No results for input(s): LIPASE, AMYLASE in the last 168 hours. Recent Labs  Lab 07/23/20 1612  AMMONIA <9*   Coagulation Profile: No results for input(s): INR, PROTIME in the last 168 hours. Cardiac Enzymes: No results for input(s): CKTOTAL, CKMB, CKMBINDEX, TROPONINI in the last 168 hours. BNP (last 3 results) No results for input(s): PROBNP in the last 8760 hours. HbA1C: Recent Labs    07/24/20 0303  HGBA1C 8.8*   CBG: Recent Labs  Lab 07/24/20 1156 07/24/20 1659 07/24/20 2125 07/25/20 0627 07/25/20 1125  GLUCAP 123* 407* 268* 317* 158*   Lipid Profile: Recent Labs    07/24/20 0303  CHOL 117  HDL 36*  LDLCALC 37  TRIG 222*  CHOLHDL 3.3   Thyroid Function Tests: Recent Labs    07/23/20 1612  TSH 13.148*   Anemia Panel: No results for input(s): VITAMINB12, FOLATE, FERRITIN, TIBC, IRON, RETICCTPCT in the last 72 hours. Urine analysis: No results found for: COLORURINE, APPEARANCEUR, LABSPEC, PHURINE, GLUCOSEU, HGBUR, BILIRUBINUR, KETONESUR, PROTEINUR, UROBILINOGEN, NITRITE, LEUKOCYTESUR Sepsis Labs: '@LABRCNTIP'$ (procalcitonin:4,lacticidven:4)  )No results found for this or any previous visit (from the past 240 hour(s)).       Radiology Studies: CT ANGIO HEAD W OR WO CONTRAST  Result Date: 07/23/2020 CLINICAL DATA:  Prior stroke, bleed on CT head EXAM: CT ANGIOGRAPHY HEAD TECHNIQUE: Multidetector CT imaging of the head was performed using the standard protocol during bolus administration of intravenous contrast. Multiplanar CT image reconstructions and MIPs were obtained to evaluate the vascular anatomy. CONTRAST:  54m OMNIPAQUE IOHEXOL 350 MG/ML SOLN COMPARISON:  Correlation made with prior CT and MR imaging FINDINGS: CTA HEAD Anterior circulation: Intracranial internal carotid arteries are patent with calcified plaque causing mild stenosis. Recanalization of prior thrombus with  now patent left middle cerebral artery. Improved flow within the distal left MCA territory. Right middle cerebral artery and both anterior cerebral arteries are patent. Posterior circulation: Intracranial vertebral, basilar, and posterior cerebral arteries are patent. Left posterior communicating artery is present. Venous sinuses: Not well evaluated. Review of the MIP images confirms the above findings IMPRESSION: Left MCA is now patent.  No new abnormality. Electronically Signed   By: PMacy MisM.D.   On: 07/23/2020 17:18   CT HEAD WO CONTRAST  Result Date: 07/25/2020 CLINICAL DATA:  Stroke with hemorrhage, follow-up EXAM: CT HEAD WITHOUT CONTRAST TECHNIQUE: Contiguous axial images were obtained from the base of the skull through the vertex without intravenous contrast. COMPARISON:  07/24/2020 FINDINGS: Brain: Areas of infarction in the left MCA territory with hemorrhage again identified in frontal, parietal, and temporal lobes with involvement of basal ganglia and insula. Area of infarction in the left PCA territory with hemorrhage again identified in the left occipital lobe. Appearance is similar to the prior study. Associated mass effect with partial effacement the left lateral ventricle is similar. There is no midline shift or hydrocephalus. No new loss of gray-white differentiation. Vascular: No new findings. Skull: Calvarium is unremarkable. Sinuses/Orbits: Right ethmoid opacification primarily posteriorly. Other: None. IMPRESSION: Left MCA and PCA territory infarctions with hemorrhagic conversion. Appearance is similar to the prior study. Stable mild mass effect. Electronically Signed   By: PMacy MisM.D.   On: 07/25/2020 09:35   CT HEAD WO CONTRAST  Addendum Date: 07/24/2020   ADDENDUM REPORT: 07/24/2020 12:26 ADDENDUM: The imaging was discussed with Dr. KLeonel Ramsay The patient had intravenous contrast yesterday for CTA. The increased density in the left MCA infarct may be due to  enhancement of subacute infarction rather than hemorrhage. There was a small amount of petechial hemorrhage in the left MCA infarct yesterday however the density is diffusely distributed throughout the infarct and I think is more likely contrast enhancement than hemorrhage. The hyperdensity in the left occipital lobe was present yesterday and is due to recent hemorrhage. This may also show slight enhancement compared to yesterday's study. Electronically Signed   By: CFranchot GalloM.D.   On: 07/24/2020 12:26   Addendum Date: 07/24/2020   ADDENDUM REPORT: 07/24/2020 12:09 ADDENDUM: These results were called by telephone  at the time of interpretation on 07/24/2020 at 12:09 pm to provider Broadus John, who verbally acknowledged these results. Electronically Signed   By: Franchot Gallo M.D.   On: 07/24/2020 12:09   Result Date: 07/24/2020 CLINICAL DATA:  Stroke.  Intracranial hemorrhage. EXAM: CT HEAD WITHOUT CONTRAST TECHNIQUE: Contiguous axial images were obtained from the base of the skull through the vertex without intravenous contrast. COMPARISON:  CT head 07/23/2020 FINDINGS: Brain: Progressive hemorrhage in left MCA infarct. There is new/progressive hemorrhage in the left caudate, left basal ganglia, left insula, as well as left frontal parietal lobes compared to yesterday when a small amount of hemorrhage is present. Mild amount of subarachnoid hemorrhage also present. Small area of hemorrhage in left occipital infarct unchanged from yesterday. Increased mass-effect on the left lateral ventricle without significant midline shift. Vascular: Diffuse increased density of the circle Willis due to CT angio head yesterday. Skull: Negative Sinuses/Orbits: Mucosal edema paranasal sinuses.  Negative orbit Other: None IMPRESSION: Progressive hemorrhage in left MCA infarct involving the basal ganglia, insula, and left frontal parietal lobe when compared to CT yesterday. Smaller area of hemorrhage in the left occipital lobe  infarct is unchanged from yesterday. No midline shift.  Local mass-effect on the left lateral ventricle. Electronically Signed: By: Franchot Gallo M.D. On: 07/24/2020 11:55   CT HEAD WO CONTRAST  Addendum Date: 07/23/2020   ADDENDUM REPORT: 07/23/2020 17:06 ADDENDUM: These results were called by telephone at the time of interpretation on 07/23/2020 at 4:55 pm to provider Dr. Kathrynn Speed, who verbally acknowledged these results. As discussed at this time, petechial hemorrhage was present within the medial left occipital lobe on the prior brain MRI of 07/12/2020. Electronically Signed   By: Kellie Simmering DO   On: 07/23/2020 17:06   Result Date: 07/23/2020 CLINICAL DATA:  Mental status change, unknown cause. EXAM: CT HEAD WITHOUT CONTRAST TECHNIQUE: Contiguous axial images were obtained from the base of the skull through the vertex without intravenous contrast. COMPARISON:  Brain MRI 07/12/2020. Report from thrombectomy 07/11/2020. CT angiogram head/neck 07/11/2020. FINDINGS: Brain: Mild cerebral and cerebellar atrophy. New from prior exams, there is a 1.9 x 1.3 cm focus of acute parenchymal hemorrhage within the paramedian left occipital lobe at site of a known subacute infarct left PCA territory. Petechial hemorrhage is present within the left caudate and lentiform nuclei and within the cortex of the left frontotemporal operculum, left insula and left temporal lobe at site of a known subacute left MCA territory infarct. Minimal mass effect with subtle partial effacement of the left lateral ventricle. No midline shift. No extra-axial fluid collection. No evidence of intracranial mass. Vascular: No hyperdense vessel.  Atherosclerotic calcifications. Skull: Normal. Negative for fracture or focal lesion. Sinuses/Orbits: Rightward gaze. Moderate partial opacification of right ethmoid air cells posteriorly. Minimal partial opacification of left ethmoid air cells. IMPRESSION: 1.9 x 1.3 cm acute parenchymal  hemorrhage within the paramedian left occipital lobe at site of a known subacute left PCA territory infarct. Petechial hemorrhage within the left basal ganglia, left frontoparietal operculum, left insula and left temporal lobe at site of a known subacute left MCA territory infarct. Subtle mass effect with partial effacement of the left lateral ventricle. No midline shift. Electronically Signed: By: Kellie Simmering DO On: 07/23/2020 16:45   EEG adult  Result Date: 07/23/2020 Lora Havens, MD     07/23/2020  7:22 PM Patient Name: YARITZEL MOREY MRN: [id number] Epilepsy Attending: Lora Havens Referring Physician/Provider: Clance Boll Date: 07/23/2020 Duration:  24.20 mins Patient history:  60 yo F with Left PCA infarct and overlying hemorrhage noted to have transient unresponsive episode of unknown etiology. EEG to evaluate for seizure Level of alertness: Awake AEDs during EEG study: None Technical aspects: This EEG study was done with scalp electrodes positioned according to the 10-20 International system of electrode placement. Electrical activity was acquired at a sampling rate of '500Hz'$  and reviewed with a high frequency filter of '70Hz'$  and a low frequency filter of '1Hz'$ . EEG data were recorded continuously and digitally stored. Description: The posterior dominant rhythm consists of 9 Hz activity of moderate voltage (25-35 uV) seen predominantly in posterior head regions, symmetric and reactive to eye opening and eye closing. EEG showed ntermittent 3 to 6 Hz theta-delta slowing in left temporal region. Hyperventilation and photic stimulation were not performed.   ABNORMALITY -Intermittent slow, left temporal region. IMPRESSION: This study is suggestive of nonspecific left temporal dysfunction. No seizures or epileptiform discharges were seen throughout the recording. Priyanka Barbra Sarks   Overnight EEG with video  Result Date: 07/24/2020 Lora Havens, MD     07/24/2020 12:31 PM Patient Name: BRITTINAY SHERRICK  MRN: [id number] Epilepsy Attending: Lora Havens Referring Physician/Provider: Dr Roland Rack Duration: 07/23/2020 1839 to 07/24/2020 1058  Patient history: 60 yo F with Left PCA infarct and overlying hemorrhage noted to have transient unresponsive episode of unknown etiology. EEG to evaluate for seizure  Level of alertness: Awake, asleep  AEDs during EEG study: None  Technical aspects: This EEG study was done with scalp electrodes positioned according to the 10-20 International system of electrode placement. Electrical activity was acquired at a sampling rate of '500Hz'$  and reviewed with a high frequency filter of '70Hz'$  and a low frequency filter of '1Hz'$ . EEG data were recorded continuously and digitally stored.  Description: The posterior dominant rhythm consists of 9 Hz activity of moderate voltage (25-35 uV) seen predominantly in posterior head regions, symmetric and reactive to eye opening and eye closing.  Sleep was characterized by vertex waves, sleep spindles (12 to 14 Hz), maximal frontocentral region, REM sleep.  EEG showed intermittent 3 to 6 Hz theta-delta slowing in left temporal region. Hyperventilation and photic stimulation were not performed.    ABNORMALITY -Intermittent slow, left temporal region.  IMPRESSION: This study is suggestive of nonspecific left temporal dysfunction. No seizures or epileptiform discharges were seen throughout the recording.  Priyanka Barbra Sarks        Scheduled Meds: .  stroke: mapping our early stages of recovery book   Does not apply Once  . amitriptyline  25 mg Oral QHS  . amLODipine  10 mg Oral Daily  . atorvastatin  80 mg Oral QPM  . cholecalciferol  1,000 Units Oral Daily  . cinacalcet  60 mg Oral Q breakfast  . gabapentin  300 mg Oral Daily  . insulin aspart  0-6 Units Subcutaneous TID WC  . insulin glargine  24 Units Subcutaneous BID  . levothyroxine  100 mcg Oral Q0600  . losartan  50 mg Oral Daily  . sevelamer carbonate  800 mg Oral  TID WC  . torsemide  20 mg Oral Daily  . vitamin B-12  2,000 mcg Oral Daily  . vitamin E  400 Units Oral Daily   Continuous Infusions:   LOS: 2 days    Time spent: 1mn  PDomenic Polite MD [facility]  07/25/2020, 3:49 PM

## 2020-07-25 NOTE — Progress Notes (Signed)
IP rehab admissions - I received a call from insurance carrier.  I have sent them updates today.  Will await insurance carrier decision and then follow up.  Call for questions.  [phone]

## 2020-07-25 NOTE — Procedures (Signed)
I was present at this dialysis session. I have reviewed the session itself and made appropriate changes.   Vital signs in last 24 hours:  Temp:  [97.6 F (36.4 C)-98.4 F (36.9 C)] 97.6 F (36.4 C) (03/03 0700) Pulse Rate:  [49-64] 54 (03/03 0730) Resp:  [13-20] 13 (03/03 0730) BP: (115-174)/(41-85) 115/55 (03/03 0830) SpO2:  [68 %-100 %] 68 % (03/03 0730) Weight:  [111.6 kg] 111.6 kg (03/03 0700) Weight change: -3.7 kg Filed Weights   07/24/20 0410 07/25/20 0553 07/25/20 0700  Weight: 115.3 kg 111.6 kg 111.6 kg    Recent Labs  Lab 07/25/20 0726  NA 129*  K 5.0  CL 93*  CO2 22  GLUCOSE 286*  BUN 49*  CREATININE 11.54*  CALCIUM 8.8*  PHOS 6.8*    Recent Labs  Lab 07/19/20 0505 07/22/20 0453 07/23/20 1300 07/24/20 0303 07/25/20 0726  WBC 6.4   < > 8.2 6.8 6.7  NEUTROABS 3.6  --   --   --   --   HGB 11.3*   < > 11.7* 12.3 11.5*  HCT 35.9*   < > 37.1 37.8 36.9  MCV 96.5   < > 95.6 95.5 96.9  PLT 216   < > 241 272 270   < > = values in this interval not displayed.    Scheduled Meds: .  stroke: mapping our early stages of recovery book   Does not apply Once  . amitriptyline  25 mg Oral QHS  . amLODipine  10 mg Oral Daily  . atorvastatin  80 mg Oral QPM  . cholecalciferol  1,000 Units Oral Daily  . cinacalcet  60 mg Oral Q breakfast  . gabapentin  300 mg Oral Daily  . insulin aspart  0-6 Units Subcutaneous TID WC  . insulin glargine  24 Units Subcutaneous BID  . levothyroxine  50 mcg Intravenous Daily  . losartan  50 mg Oral Daily  . sevelamer carbonate  800 mg Oral TID WC  . torsemide  20 mg Oral Daily  . vitamin B-12  2,000 mcg Oral Daily  . vitamin E  400 Units Oral Daily   Continuous Infusions: PRN Meds:.acetaminophen **OR** acetaminophen (TYLENOL) oral liquid 160 mg/5 mL **OR** acetaminophen, hydrALAZINE, senna-docusate     Assessment/Plan:  1. AMS- happened acutely with HD 07/23/20 without drop in BP.  CT of head with acute parenchymal hemorrhage  within the paramedian left occipital lobe at site of known subacute left PCA territory infarct.  Neuro following and recommend EEG and CT scan again today.  She is more awake and alert this morning. 2. ESRD - cont with HD on TTS schedule 3. HTN/volume - stable 4. 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 CVA - s/p thrombectomy and was in CIR. 7. Bradycardia - has been persistent and not on beta-blocker.  EP re-consulted   Donetta Potts,  MD 07/25/2020, 8:43 AM

## 2020-07-26 DIAGNOSIS — R4701 Aphasia: Secondary | ICD-10-CM

## 2020-07-26 LAB — CBC
HCT: 39 % (ref 36.0–46.0)
Hemoglobin: 12.2 g/dL (ref 12.0–15.0)
MCH: 30.3 pg (ref 26.0–34.0)
MCHC: 31.3 g/dL (ref 30.0–36.0)
MCV: 96.8 fL (ref 80.0–100.0)
Platelets: 227 10*3/uL (ref 150–400)
RBC: 4.03 MIL/uL (ref 3.87–5.11)
RDW: 14.5 % (ref 11.5–15.5)
WBC: 6.2 10*3/uL (ref 4.0–10.5)
nRBC: 0 % (ref 0.0–0.2)

## 2020-07-26 LAB — BASIC METABOLIC PANEL
Anion gap: 17 — ABNORMAL HIGH (ref 5–15)
BUN: 35 mg/dL — ABNORMAL HIGH (ref 8–23)
CO2: 22 mmol/L (ref 22–32)
Calcium: 9 mg/dL (ref 8.9–10.3)
Chloride: 93 mmol/L — ABNORMAL LOW (ref 98–111)
Creatinine, Ser: 8.35 mg/dL — ABNORMAL HIGH (ref 0.44–1.00)
GFR, Estimated: 5 mL/min — ABNORMAL LOW (ref >60)
Glucose, Bld: 249 mg/dL — ABNORMAL HIGH (ref 70–99)
Potassium: 4.7 mmol/L (ref 3.5–5.1)
Sodium: 132 mmol/L — ABNORMAL LOW (ref 135–145)

## 2020-07-26 LAB — GLUCOSE, CAPILLARY
Glucose-Capillary: 200 mg/dL — ABNORMAL HIGH (ref 70–99)
Glucose-Capillary: 201 mg/dL — ABNORMAL HIGH (ref 70–99)
Glucose-Capillary: 203 mg/dL — ABNORMAL HIGH (ref 70–99)
Glucose-Capillary: 211 mg/dL — ABNORMAL HIGH (ref 70–99)

## 2020-07-26 MED ORDER — INSULIN GLARGINE 100 UNIT/ML ~~LOC~~ SOLN
35.0000 [IU] | Freq: Two times a day (BID) | SUBCUTANEOUS | Status: DC
  Administered 2020-07-26 – 2020-08-01 (×13): 35 [IU] via SUBCUTANEOUS
  Filled 2020-07-26 (×15): qty 0.35

## 2020-07-26 MED ORDER — POLYETHYLENE GLYCOL 3350 17 G PO PACK
17.0000 g | PACK | Freq: Every day | ORAL | Status: DC
  Administered 2020-07-26 – 2020-08-01 (×7): 17 g via ORAL
  Filled 2020-07-26 (×7): qty 1

## 2020-07-26 NOTE — Progress Notes (Addendum)
PROGRESS NOTE    Rachael Herrera  [id number] DOB: [birth year] DOA: 07/23/2020 PCP: Patient, No Pcp Per  Brief Narrative: 60 year old female history of hypertension, type 2 diabetes mellitus with peripheral neuropathy, remote history of hypoglycemic seizure, history of tachybradycardia syndrome was admitted on 2/17 with right-sided weakness, ataxia and left gaze preference, CTA head noted left occipital infarct with a nonocclusive thrombus septic at the left MCA bifurcation, she underwent cerebral angiogram with mechanical thrombectomy and revascularization, follow-up MRI brain noted acute ischemic infarcts in left MCA/PCA territories and a small amount of petechial blood in the left basalganglia, she developed A. fib this admission seen by EP and was started on anticoagulation, she also had episodes of bradycardia, PPM was not felt to be needed at the time, continue to have global aphasia, right-sided weakness with sensory deficits and subsequently discharged to inpatient rehab on 2/24, continued to have severe nonfluent expressive aphasia but was awake alert mute and not following commands on 3/1 she became acutely unresponsive and hemodialysis, CT head noted left occipital hemorrhage at the site of prior infarct as well as petechial hemorrhage within the site of left prior left MCA stroke, neurology was consulted, CTA head noted left MCA to be patent, EEG ordered   Assessment & Plan:   Intracranial hemorrhage, acute versus subacute Encephalopathy -Admitted from rehab following an episode of unresponsiveness, CT head noted 1.9X 1.3 cm intraparenchymal hemorrhage with mild left paramedian shift, petechial hemorrhage in the left basal ganglia at the site of previously known MCA infarct -Follow-up CT head with some progression of the hemorrhage which is likely contrast, discussed with neuro MD -Eliquis discontinued -EEG is unremarkable -Mental status is improved and stable-back to baseline global  aphasia, appreciate neurology input -PT/OT/SLP eval -Ammonia level is normal -TSH was very high, started on IV Synthroid , transition to p.o. today  -possibly discharge back to CIR, resumption of Eliquis per Neuro  Bradycardia -Likely secondary to intracranial bleed -Prior history of bradycardia failed PPM insertion last admission -Appreciate EP input, PPM not felt to be indicated at this time, recommended follow-up with outpatient cardiology  Recent stroke CTA head noted left occipital infarct with a nonocclusive thrombus septic at the left MCA bifurcation, she underwent cerebral angiogram with mechanical thrombectomy and revascularization, follow-up MRI brain noted acute ischemic infarcts in left MCA/PCA territories and a small amount of petechial blood in the left basalganglia, she developed A. fib this admission seen by EP and was started on anticoagulation -Hospitalized from 2/17 through 2/24 and then sent to CIR before readmission -Will need prolonged rehabilitation  ESRD on hemodialysis -Nephrology following  Type 1 diabetes mellitus -CBGs in the two hundreds, will increase Lantus to 35 units twice daily  Hypothyroidism -TSH is very high, Synthroid increased and changed to IV for now  Paroxysmal atrial fibrillation -Anticoagulation on hold, as above  DVT prophylaxis: SCDs Code Status: Full code Family Communication: No family at bedside Disposition Plan:  Status is: Inpatient  Remains inpatient appropriate because:Inpatient level of care appropriate due to severity of illness   Dispo: The patient is from:CIR              Anticipated d/c is to: CIR              Patient currently is medically stable to d/c.   Difficult to place patient No  Consultants:   Neuro  EP   Procedures:   Antimicrobials:    Subjective: -No events overnight, sitting up in bed eating breakfast,  notes global aphasia Objective: Vitals:   07/26/20 0403 07/26/20 0759 07/26/20 1026  07/26/20 1143  BP: 139/78 (!) 142/67  (!) 141/62  Pulse:  (!) 50  68  Resp: '15 16  16  '$ Temp:  97.9 F (36.6 C)  97.9 F (36.6 C)  TempSrc:  Oral  Oral  SpO2: 97% 97%    Weight:      Height:   '5\' 8"'$  (1.727 m)     Intake/Output Summary (Last 24 hours) at 07/26/2020 1204 Last data filed at 07/26/2020 0853 Gross per 24 hour  Intake 360 ml  Output 0 ml  Net 360 ml   Filed Weights   07/25/20 0553 07/25/20 0700 07/25/20 1025  Weight: 111.6 kg 111.6 kg 110.7 kg    Examination:  General exam: Obese pleasant female sitting up in bed eating breakfast, awake alert, significant global aphasia, occasionally follows some commands CVS: S1-S2, regular rate rhythm Lungs: Decreased breath sounds to bases, otherwise clear Abdomen: Soft, nontender, bowel sounds present Extremities: No edema, left arm AV fistula Neuro: Mild right leg weakness,  4/5 on the right  Psychiatry: Flat affect    Data Reviewed:   CBC: Recent Labs  Lab 07/22/20 0453 07/23/20 1300 07/24/20 0303 07/25/20 0726 07/26/20 0336  WBC 7.8 8.2 6.8 6.7 6.2  HGB 12.8 11.7* 12.3 11.5* 12.2  HCT 38.6 37.1 37.8 36.9 39.0  MCV 94.4 95.6 95.5 96.9 96.8  PLT 241 241 272 270 [id number]   Basic Metabolic Panel: Recent Labs  Lab 07/21/20 0528 07/22/20 0453 07/23/20 1300 07/23/20 1723 07/23/20 2012 07/24/20 0303 07/25/20 0726 07/26/20 0336  NA 137 135 133* 135  --  137 129* 132*  K 3.9 4.0 4.4 4.2  --  4.1 5.0 4.7  CL 96* 93* 93* 93*  --  96* 93* 93*  CO2 25 25 20* 26  --  '27 22 22  '$ GLUCOSE 144* 112* 268* 136*  --  89 286* 249*  BUN 32* 44* 61* 31*  --  32* 49* 35*  CREATININE 8.14* 9.88* 12.72* 8.25*  --  9.11* 11.54* 8.35*  CALCIUM 9.0 9.2 8.9 9.0  --  9.1 8.8* 9.0  MG  --   --   --   --  2.2  --   --   --   PHOS 5.1* 5.2* 6.7*  --  4.6  --  6.8*  --    GFR: Estimated Creatinine Clearance: 9.2 mL/min (A) (by C-G formula based on SCr of 8.35 mg/dL (H)). Liver Function Tests: Recent Labs  Lab 07/21/20 0528  07/22/20 0453 07/23/20 1300 07/25/20 0726  ALBUMIN 3.5 3.6 3.6 3.4*   No results for input(s): LIPASE, AMYLASE in the last 168 hours. Recent Labs  Lab 07/23/20 1612  AMMONIA <9*   Coagulation Profile: No results for input(s): INR, PROTIME in the last 168 hours. Cardiac Enzymes: No results for input(s): CKTOTAL, CKMB, CKMBINDEX, TROPONINI in the last 168 hours. BNP (last 3 results) No results for input(s): PROBNP in the last 8760 hours. HbA1C: Recent Labs    07/24/20 0303  HGBA1C 8.8*   CBG: Recent Labs  Lab 07/25/20 1125 07/25/20 1608 07/25/20 2149 07/26/20 0757 07/26/20 1142  GLUCAP 158* 354* 359* 200* 201*   Lipid Profile: Recent Labs    07/24/20 0303  CHOL 117  HDL 36*  LDLCALC 37  TRIG 222*  CHOLHDL 3.3   Thyroid Function Tests: Recent Labs    07/23/20 1612  TSH 13.148*   Anemia Panel: No  results for input(s): VITAMINB12, FOLATE, FERRITIN, TIBC, IRON, RETICCTPCT in the last 72 hours. Urine analysis: No results found for: COLORURINE, APPEARANCEUR, LABSPEC, PHURINE, GLUCOSEU, HGBUR, BILIRUBINUR, KETONESUR, PROTEINUR, UROBILINOGEN, NITRITE, LEUKOCYTESUR Sepsis Labs: '@LABRCNTIP'$ (procalcitonin:4,lacticidven:4)  )No results found for this or any previous visit (from the past 240 hour(s)).   Radiology Studies: CT HEAD WO CONTRAST  Result Date: 07/25/2020 CLINICAL DATA:  Stroke with hemorrhage, follow-up EXAM: CT HEAD WITHOUT CONTRAST TECHNIQUE: Contiguous axial images were obtained from the base of the skull through the vertex without intravenous contrast. COMPARISON:  07/24/2020 FINDINGS: Brain: Areas of infarction in the left MCA territory with hemorrhage again identified in frontal, parietal, and temporal lobes with involvement of basal ganglia and insula. Area of infarction in the left PCA territory with hemorrhage again identified in the left occipital lobe. Appearance is similar to the prior study. Associated mass effect with partial effacement the left  lateral ventricle is similar. There is no midline shift or hydrocephalus. No new loss of gray-white differentiation. Vascular: No new findings. Skull: Calvarium is unremarkable. Sinuses/Orbits: Right ethmoid opacification primarily posteriorly. Other: None. IMPRESSION: Left MCA and PCA territory infarctions with hemorrhagic conversion. Appearance is similar to the prior study. Stable mild mass effect. Electronically Signed   By: Macy Mis M.D.   On: 07/25/2020 09:35   Scheduled Meds: .  stroke: mapping our early stages of recovery book   Does not apply Once  . amitriptyline  25 mg Oral QHS  . amLODipine  10 mg Oral Daily  . atorvastatin  80 mg Oral QPM  . cholecalciferol  1,000 Units Oral Daily  . cinacalcet  60 mg Oral Q breakfast  . gabapentin  300 mg Oral Daily  . insulin aspart  0-6 Units Subcutaneous TID WC  . insulin glargine  35 Units Subcutaneous BID  . levothyroxine  100 mcg Oral Q0600  . losartan  50 mg Oral Daily  . polyethylene glycol  17 g Oral Daily  . sevelamer carbonate  800 mg Oral TID WC  . torsemide  20 mg Oral Daily  . vitamin B-12  2,000 mcg Oral Daily  . vitamin E  400 Units Oral Daily   Continuous Infusions:   LOS: 3 days    Time spent: 76mn  PDomenic Polite MD [facility]  07/26/2020, 12:04 PM

## 2020-07-26 NOTE — Progress Notes (Signed)
Neurology Progress Note  S: No overnight events. Patient denies any problems today.   O: Current vital signs: BP (!) 142/67 (BP Location: Right Wrist)   Pulse (!) 50   Temp 97.9 F (36.6 C) (Oral)   Resp 16   Wt 110.7 kg   SpO2 97%   BMI 37.11 kg/m  Vital signs in last 24 hours: Temp:  [97.6 F (36.4 C)-98.2 F (36.8 C)] 97.9 F (36.6 C) (03/04 0759) Pulse Rate:  [50-67] 50 (03/04 0759) Resp:  [12-16] 16 (03/04 0759) BP: (101-152)/(44-78) 142/67 (03/04 0759) SpO2:  [93 %-100 %] 97 % (03/04 0759) Weight:  [110.7 kg] 110.7 kg (03/03 1025)  GENERAL: Awake, alert in NAD HEENT: Normocephalic and atraumatic LUNGS: Normal respiratory effort.  CV: bradycardia.  Ext: warm   NEURO:  Mental Status: Tries to say her name. When examiner says it, she says yes. Can not tell NP where she is, city, state, day, month, or state.  Speech/Language: speech is without aphasia or dysarthria. She shows NP thumb and fist on request. She follows simple commands. Her speech is without fluency or repetition. Her comprehension is slowed but is intact. She preseverates at times, but is much improved from last time NP saw her.   Cranial Nerves:  II: PERRL. Visual fields full.  III, IV, VI: EOMI. Eyelids elevate symmetrically.  V: Sensation is intact to light touch and symmetrical to face.  VII: Smile is symmetrical.  VIII: hearing intact to voice. IX, X: Phonation is normal.  Motor: 5/5 strength to all muscle groups tested.  Tone: is normal and bulk is normal. Obese.  Sensation- Intact to light touch bilaterally. Does not follow commands or respond to touch or extinction testing.   Medications  Current Facility-Administered Medications:  .   stroke: mapping our early stages of recovery book, , Does not apply, Once, Wynetta Fines T, MD .  acetaminophen (TYLENOL) tablet 650 mg, 650 mg, Oral, Q4H PRN **OR** acetaminophen (TYLENOL) 160 MG/5ML solution 650 mg, 650 mg, Per Tube, Q4H PRN **OR**  acetaminophen (TYLENOL) suppository 650 mg, 650 mg, Rectal, Q4H PRN, Wynetta Fines T, MD .  amitriptyline (ELAVIL) tablet 25 mg, 25 mg, Oral, QHS, Wynetta Fines T, MD, 25 mg at 07/25/20 2103 .  amLODipine (NORVASC) tablet 10 mg, 10 mg, Oral, Daily, Wynetta Fines T, MD, 10 mg at 07/26/20 0903 .  atorvastatin (LIPITOR) tablet 80 mg, 80 mg, Oral, QPM, Wynetta Fines T, MD, 80 mg at 07/25/20 1813 .  cholecalciferol (VITAMIN D3) tablet 1,000 Units, 1,000 Units, Oral, Daily, Lequita Halt, MD, 1,000 Units at 07/26/20 [phone] .  cinacalcet (SENSIPAR) tablet 60 mg, 60 mg, Oral, Q breakfast, Wynetta Fines T, MD, 60 mg at 07/26/20 0851 .  gabapentin (NEURONTIN) capsule 300 mg, 300 mg, Oral, Daily, Wynetta Fines T, MD, 300 mg at 07/26/20 0903 .  hydrALAZINE (APRESOLINE) injection 5 mg, 5 mg, Intravenous, Q4H PRN, Wynetta Fines T, MD .  insulin aspart (novoLOG) injection 0-6 Units, 0-6 Units, Subcutaneous, TID WC, Lequita Halt, MD, 1 Units at 07/26/20 [phone] .  insulin glargine (LANTUS) injection 35 Units, 35 Units, Subcutaneous, BID, Domenic Polite, MD, 35 Units at 07/26/20 [phone] .  levothyroxine (SYNTHROID) tablet 100 mcg, 100 mcg, Oral, Q0600, Domenic Polite, MD, 100 mcg at 07/26/20 0506 .  losartan (COZAAR) tablet 50 mg, 50 mg, Oral, Daily, Wynetta Fines T, MD, 50 mg at 07/26/20 0903 .  senna-docusate (Senokot-S) tablet 1 tablet, 1 tablet, Oral, QHS PRN, Lequita Halt, MD .  sevelamer carbonate (RENVELA) tablet 800 mg, 800 mg, Oral, TID WC, Wynetta Fines T, MD, 800 mg at 07/26/20 0851 .  torsemide (DEMADEX) tablet 20 mg, 20 mg, Oral, Daily, Wynetta Fines T, MD, 20 mg at 07/26/20 0903 .  vitamin B-12 (CYANOCOBALAMIN) tablet 2,000 mcg, 2,000 mcg, Oral, Daily, Wynetta Fines T, MD, 2,000 mcg at 07/26/20 [phone] .  vitamin E capsule 400 Units, 400 Units, Oral, Daily, Lequita Halt, MD, 400 Units at 07/26/20 [id number]  Imaging MD has reviewed images in epic and the results pertinent to this consultation are:  CT Head Dr. Leonel Ramsay felt the  area of bleeding was old given the same as previous MRI.  Left MCA and PCA territory infarctions with hemorrhagic conversion. Appearance is similar to the prior study. Stable mild mass effect.  Assessment: 60 yo female who had a recent embolic ischemic stroke at Left MCA and PCA territory and was hospitalized from 2/17-2/24/22. She underwent mechanical thrombedtomy on 07/11/20.  Later, she was discharged to CIR.  Patient was in HD on 07/24/20 and a code stroke was called for an episode of unresponsiveness. CTH was thought to have a bleed, but radiology spoke to Dr. Leonel Ramsay and this finding was the same as found on earlier MRI. Her symptoms (were at baseline) by the time she was returned to her rehab bed. ? Etiology. Her Eliquis was held and [facility] was performed again today and results as able. EEG was negative for seizure activity.   Hospitalist admitted patient to the hospital from [place]. Cardiology was consulted and did not feel patient needed any workup for bradycardia and could f/up outpatient. No other etiology was found for the episode.   Plan:  As far as neurology is concerned, patient may be moved back to CIR.  What to do about eliquis?   Will talk with Leonel Ramsay.    Pt seen by Clance Boll, MSN, APN-BC/Nurse Practitioner/Neuro and later by MD. Note and plan to be edited as needed by MD.  Pager: [phone]  The hematoma that is seen on CT is likely older, but could theoretically be as recent as 2/28.  After discussion with Dr. Leonie Man today, we will plan repeat CT on Monday and restart anticoagulation if it continues to be stable.  We will follow up after the repeat CT, but no preclusion to moving to CIR at this time.  I continue to suspect that the imaging findings are completely unrelated to the patient's decreased mental status, and think that that was rather dialysis disequilibrium given that she often gets lethargic after dialysis, and in the setting of focal neurological  deficits this can be much more prominent.  Roland Rack, MD [facility] [phone]  If 7pm- 7am, please page neurology on call as listed in [place].

## 2020-07-26 NOTE — Progress Notes (Signed)
Occupational Therapy Treatment Patient Details Name: Rachael Herrera MRN: [id number] DOB: September 05, 1960 Today's Date: 07/26/2020    History of present illness Pt readmitted to acute from CIR on 07/23/20 after with acute mental status change during dialysis. Was in [place] for recent L MCA and PCA infarcts.  Initially thought to have new hemorrhagic stroke but neuro reports the following. - "She does have some hemorrhagic transformation of her stroke, but this was present on her MRI several weeks ago, just more confluent in the posterior aspect.  The CT 3/1 demonstrated enhancement of the entire stroke bed, but I do not feel that this was worsening petechial hemorrhage as it was more consistent with contrast enhancement.    PMHx:   thrombectomy 2/17. DM II, OSA, CKD IV, HTN, HLD, and morbid obesity.   OT comments  Assisted pt to bathroom with hand held assist. Toileted and groomed with min assist. Pt declined further mobility and ADL with uncle visiting. Will continue to follow.   Follow Up Recommendations  CIR    Equipment Recommendations  3 in 1 bedside commode    Recommendations for Other Services      Precautions / Restrictions Precautions Precautions: Fall Precaution Comments: R inattention       Mobility Bed Mobility Overal bed mobility: Needs Assistance Bed Mobility: Supine to Sit;Sit to Supine     Supine to sit: Min guard Sit to supine: Min guard   General bed mobility comments: Assist for safety.    Transfers Overall transfer level: Needs assistance Equipment used: 1 person hand held assist Transfers: Sit to/from Stand Sit to Stand: Min assist         General transfer comment: steadying assist    Balance Overall balance assessment: Needs assistance Sitting-balance support: Feet supported Sitting balance-Leahy Scale: Fair     Standing balance support: Single extremity supported Standing balance-Leahy Scale: Poor                             ADL either  performed or assessed with clinical judgement   ADL Overall ADL's : Needs assistance/impaired     Grooming: Wash/dry hands;Standing;Min guard Grooming Details (indicate cue type and reason): verbal cues for thoroughness                 Toilet Transfer: Minimal assistance;Ambulation Toilet Transfer Details (indicate cue type and reason): hand held due to small area from bed to bathroom Toileting- Clothing Manipulation and Hygiene: Minimal assistance;Sit to/from stand       Functional mobility during ADLs: Minimal assistance       Vision       Perception     Praxis      Cognition Arousal/Alertness: Awake/alert Behavior During Therapy: Impulsive;Flat affect Overall Cognitive Status: Difficult to assess Area of Impairment: Problem solving;Awareness;Safety/judgement;Following commands;Memory;Attention;Orientation                   Current Attention Level: Selective Memory: Decreased short-term memory;Decreased recall of precautions Following Commands: Follows one step commands inconsistently;Follows one step commands with increased time Safety/Judgement: Decreased awareness of safety;Decreased awareness of deficits   Problem Solving: Slow processing;Requires verbal cues;Requires tactile cues          Exercises     Shoulder Instructions       General Comments      Pertinent Vitals/ Pain       Pain Assessment: Faces Faces Pain Scale: No hurt  Home Living  Prior Functioning/Environment              Frequency  Min 2X/week        Progress Toward Goals  OT Goals(current goals can now be found in the care plan section)  Progress towards OT goals: Progressing toward goals  Acute Rehab OT Goals Patient Stated Goal: family wants return to CIR, pt in agreement OT Goal Formulation: With patient/family Time For Goal Achievement: 08/07/20 Potential to Achieve Goals: Good  Plan  Discharge plan remains appropriate    Co-evaluation                 AM-PAC OT "6 Clicks" Daily Activity     Outcome Measure   Help from another person eating meals?: A Little Help from another person taking care of personal grooming?: A Little Help from another person toileting, which includes using toliet, bedpan, or urinal?: A Lot Help from another person bathing (including washing, rinsing, drying)?: A Lot Help from another person to put on and taking off regular upper body clothing?: A Little Help from another person to put on and taking off regular lower body clothing?: A Lot 6 Click Score: 15    End of Session Equipment Utilized During Treatment: Gait belt  OT Visit Diagnosis: Unsteadiness on feet (R26.81);Muscle weakness (generalized) (M62.81);Cognitive communication deficit (R41.841);Hemiplegia and hemiparesis Symptoms and signs involving cognitive functions: Cerebral infarction Hemiplegia - caused by: Cerebral infarction   Activity Tolerance Other (comment) (visitor arrived)   Patient Left in bed;with call bell/phone within reach;with bed alarm set;with family/visitor present   Nurse Communication          Time: [id number] OT Time Calculation (min): 14 min  Charges: OT General Charges $OT Visit: 1 Visit OT Treatments $Self Care/Home Management : 8-22 mins  Nestor Lewandowsky, OTR/L Acute Rehabilitation Services Pager: [phone] Office: [phone]   Malka So 07/26/2020, 3:04 PM

## 2020-07-26 NOTE — Progress Notes (Signed)
Patient ID: Rachael Herrera, female   DOB: 02-16-1961, 60 y.o.   MRN: [id number] S: No complaints and appears back to baseline post stroke. O:BP (!) 141/62   Pulse 68   Temp 97.9 F (36.6 C) (Oral)   Resp 16   Ht '5\' 8"'$  (1.727 m)   Wt 110.7 kg   SpO2 97%   BMI 37.11 kg/m   Intake/Output Summary (Last 24 hours) at 07/26/2020 1313 Last data filed at 07/26/2020 0853 Gross per 24 hour  Intake 360 ml  Output 0 ml  Net 360 ml   Intake/Output: I/O last 3 completed shifts: In: 120 [P.O.:120] Out: 1000 [Other:1000]  Intake/Output this shift:  Total I/O In: 240 [P.O.:240] Out: -  Weight change: -0.9 kg Gen: NAD CVS: RRR Resp: cta Abd: +BS, soft, NT Ext: no edema, LUE AVF +T/B  Recent Labs  Lab 07/21/20 0528 07/22/20 0453 07/23/20 1300 07/23/20 1723 07/23/20 2012 07/24/20 0303 07/25/20 0726 07/26/20 0336  NA 137 135 133* 135  --  137 129* 132*  K 3.9 4.0 4.4 4.2  --  4.1 5.0 4.7  CL 96* 93* 93* 93*  --  96* 93* 93*  CO2 25 25 20* 26  --  '27 22 22  '$ GLUCOSE 144* 112* 268* 136*  --  89 286* 249*  BUN 32* 44* 61* 31*  --  32* 49* 35*  CREATININE 8.14* 9.88* 12.72* 8.25*  --  9.11* 11.54* 8.35*  ALBUMIN 3.5 3.6 3.6  --   --   --  3.4*  --   CALCIUM 9.0 9.2 8.9 9.0  --  9.1 8.8* 9.0  PHOS 5.1* 5.2* 6.7*  --  4.6  --  6.8*  --    Liver Function Tests: Recent Labs  Lab 07/22/20 0453 07/23/20 1300 07/25/20 0726  ALBUMIN 3.6 3.6 3.4*   No results for input(s): LIPASE, AMYLASE in the last 168 hours. Recent Labs  Lab 07/23/20 1612  AMMONIA <9*   CBC: Recent Labs  Lab 07/22/20 0453 07/23/20 1300 07/24/20 0303 07/25/20 0726 07/26/20 0336  WBC 7.8 8.2 6.8 6.7 6.2  HGB 12.8 11.7* 12.3 11.5* 12.2  HCT 38.6 37.1 37.8 36.9 39.0  MCV 94.4 95.6 95.5 96.9 96.8  PLT 241 241 272 270 227   Cardiac Enzymes: No results for input(s): CKTOTAL, CKMB, CKMBINDEX, TROPONINI in the last 168 hours. CBG: Recent Labs  Lab 07/25/20 1125 07/25/20 1608 07/25/20 2149 07/26/20 0757  07/26/20 1142  GLUCAP 158* 354* 359* 200* 201*    Iron Studies: No results for input(s): IRON, TIBC, TRANSFERRIN, FERRITIN in the last 72 hours. Studies/Results: CT HEAD WO CONTRAST  Result Date: 07/25/2020 CLINICAL DATA:  Stroke with hemorrhage, follow-up EXAM: CT HEAD WITHOUT CONTRAST TECHNIQUE: Contiguous axial images were obtained from the base of the skull through the vertex without intravenous contrast. COMPARISON:  07/24/2020 FINDINGS: Brain: Areas of infarction in the left MCA territory with hemorrhage again identified in frontal, parietal, and temporal lobes with involvement of basal ganglia and insula. Area of infarction in the left PCA territory with hemorrhage again identified in the left occipital lobe. Appearance is similar to the prior study. Associated mass effect with partial effacement the left lateral ventricle is similar. There is no midline shift or hydrocephalus. No new loss of gray-white differentiation. Vascular: No new findings. Skull: Calvarium is unremarkable. Sinuses/Orbits: Right ethmoid opacification primarily posteriorly. Other: None. IMPRESSION: Left MCA and PCA territory infarctions with hemorrhagic conversion. Appearance is similar to the prior study. Stable  mild mass effect. Electronically Signed   By: Macy Mis M.D.   On: 07/25/2020 09:35   .  stroke: mapping our early stages of recovery book   Does not apply Once  . amitriptyline  25 mg Oral QHS  . amLODipine  10 mg Oral Daily  . atorvastatin  80 mg Oral QPM  . cholecalciferol  1,000 Units Oral Daily  . cinacalcet  60 mg Oral Q breakfast  . gabapentin  300 mg Oral Daily  . insulin aspart  0-6 Units Subcutaneous TID WC  . insulin glargine  35 Units Subcutaneous BID  . levothyroxine  100 mcg Oral Q0600  . losartan  50 mg Oral Daily  . polyethylene glycol  17 g Oral Daily  . sevelamer carbonate  800 mg Oral TID WC  . torsemide  20 mg Oral Daily  . vitamin B-12  2,000 mcg Oral Daily  . vitamin E  400  Units Oral Daily    BMET    Component Value Date/Time   NA 132 (L) 07/26/2020 0336   K 4.7 07/26/2020 0336   CL 93 (L) 07/26/2020 0336   CO2 22 07/26/2020 0336   GLUCOSE 249 (H) 07/26/2020 0336   BUN 35 (H) 07/26/2020 0336   CREATININE 8.35 (H) 07/26/2020 0336   CALCIUM 9.0 07/26/2020 0336   GFRNONAA 5 (L) 07/26/2020 0336   CBC    Component Value Date/Time   WBC 6.2 07/26/2020 0336   RBC 4.03 07/26/2020 0336   HGB 12.2 07/26/2020 0336   HCT 39.0 07/26/2020 0336   PLT 227 07/26/2020 0336   MCV 96.8 07/26/2020 0336   MCH 30.3 07/26/2020 0336   MCHC 31.3 07/26/2020 0336   RDW 14.5 07/26/2020 0336   LYMPHSABS 1.4 07/19/2020 0505   MONOABS 1.1 (H) 07/19/2020 0505   EOSABS 0.2 07/19/2020 0505   BASOSABS 0.1 07/19/2020 0505     Assessment/Plan:  1. AMS- happened acutely with HD 07/23/20 without drop in BP. CT of head with acute parenchymal hemorrhage within the paramedian left occipital lobe at site of known subacute left PCA territory infarct. Neuro following and recommended repeat CT scan which showed no change in hemorrhagic conversion of LMCA and PCA territory infarctions.  MS improved. 2. ESRD - cont with HD on TTS schedule 3. HTN/volume - stable 4. 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 CVA - s/p thrombectomy and was in CIR. 7. Bradycardia - has been persistent and not on beta-blocker. EP re-consulted 8. Disposition- likely transfer back to [place], MD [facility] [phone]

## 2020-07-26 NOTE — Progress Notes (Signed)
  Speech Language Pathology Treatment: Dysphagia;Cognitive-Linquistic  Patient Details Name: Rachael Herrera MRN: [id number] DOB: 1961-03-10 Today's Date: 07/26/2020 Time: [id number] SLP Time Calculation (min) (ACUTE ONLY): 10 min  Assessment / Plan / Recommendation Clinical Impression  Pt was more attentive to PO trials today within a quieter environment. She used gestures and facial expressions to indicate that she did not want much food or drink, but she showed no overt signs of aspiration with what she did consume. Mastication is mildly prolonged and with mild oral residue remaining with soft solids. SLP also provided a few cues throughout session for completion of one-step commands, with pt needing more assistance for simple functional problem solving. She will benefit from ongoing SLP services acutely and at CIR level.    HPI HPI: 60 yo female presenting with new L occip and basal ganglia hemorrhages, after recent L MCA and PCA infarcts.  Was in CIR when bradycardia and AMS occurred, and MD then found her expanded cerebral changes but no seizure activity on EEG. Pt was being followed for aphasaia, apraxia, and dysphagia while on CIR (most recently on Dys 2 diet and thin liquids). PMHx:   thrombectomy 2/17. DM II, OSA, CKD IV, HTN, HLD, and morbid obesity.      SLP Plan  Continue with current plan of care       Recommendations  Diet recommendations: Dysphagia 3 (mechanical soft);Thin liquid Liquids provided via: Cup;Straw Medication Administration: Whole meds with puree Supervision: Staff to assist with self feeding;Full supervision/cueing for compensatory strategies Compensations: Minimize environmental distractions;Slow rate;Small sips/bites;Lingual sweep for clearance of pocketing Postural Changes and/or Swallow Maneuvers: Seated upright 90 degrees                Oral Care Recommendations: Oral care BID Follow up Recommendations: Inpatient Rehab SLP Visit Diagnosis: Dysphagia,  unspecified (R13.10);Aphasia (R47.01) Plan: Continue with current plan of care       GO                [name], M.A. [place] [facility] Pager [phone] Office [phone]  07/26/2020, 2:07 PM

## 2020-07-26 NOTE — Progress Notes (Signed)
Inpatient Rehab Admissions Coordinator:  Saw pt at bedside. Informed her that there are no beds available in CIR today.  Also awaiting insurance authorization. Will continue to follow.   Gayland Curry, [place], [place] Admissions Coordinator [phone]

## 2020-07-26 NOTE — Progress Notes (Signed)
Patient has not had a BM in several days, bowels sounds active all 4 quadrants, abdomen soft to palpation and non-tender.  Dr. Broadus John notified, order for miralax po.

## 2020-07-26 NOTE — Progress Notes (Signed)
Inpatient Diabetes Program Recommendations  AACE/[name]: New Consensus Statement on Inpatient Glycemic Control (2015)  Target Ranges:  Prepandial:   less than 140 mg/dL      Peak postprandial:   less than 180 mg/dL (1-2 hours)      Critically ill patients:  140 - 180 mg/dL   Lab Results  Component Value Date   GLUCAP 200 (H) 07/26/2020   HGBA1C 8.8 (H) 07/24/2020    Review of Glycemic Control  Results for KERIAN, BIBIAN (MRN [id number]) as of 07/26/2020 09:38  Ref. Range 07/25/2020 11:25 07/25/2020 16:08 07/25/2020 21:49 07/26/2020 07:57  Glucose-Capillary Latest Ref Range: 70 - 99 mg/dL 158 (H) 354 (H) 359 (H) 200 (H)   Diabetes history:Type 1 DM (requires basal & meal coverage) Outpatient Diabetes medications:Humalog 3-13 units TID, Lantus 80 units QHS Current orders for Inpatient glycemic control:Novolog 0-6 units TID, Lantus 34 units BID  Inpatient Diabetes Program Recommendations:  Consider also adding Novolog 3 units TID (assuming patient is consuming >50% of meals).  Thanks, Bronson Curb, MSN, RNC-OB Diabetes Coordinator [phone] (8a-5p)

## 2020-07-27 LAB — CBC
HCT: 37.1 % (ref 36.0–46.0)
Hemoglobin: 11.8 g/dL — ABNORMAL LOW (ref 12.0–15.0)
MCH: 30.4 pg (ref 26.0–34.0)
MCHC: 31.8 g/dL (ref 30.0–36.0)
MCV: 95.6 fL (ref 80.0–100.0)
Platelets: 243 10*3/uL (ref 150–400)
RBC: 3.88 MIL/uL (ref 3.87–5.11)
RDW: 14.5 % (ref 11.5–15.5)
WBC: 7.5 10*3/uL (ref 4.0–10.5)
nRBC: 0 % (ref 0.0–0.2)

## 2020-07-27 LAB — RENAL FUNCTION PANEL
Albumin: 3.4 g/dL — ABNORMAL LOW (ref 3.5–5.0)
Anion gap: 17 — ABNORMAL HIGH (ref 5–15)
BUN: 51 mg/dL — ABNORMAL HIGH (ref 8–23)
CO2: 24 mmol/L (ref 22–32)
Calcium: 9 mg/dL (ref 8.9–10.3)
Chloride: 93 mmol/L — ABNORMAL LOW (ref 98–111)
Creatinine, Ser: 10.49 mg/dL — ABNORMAL HIGH (ref 0.44–1.00)
GFR, Estimated: 4 mL/min — ABNORMAL LOW (ref >60)
Glucose, Bld: 209 mg/dL — ABNORMAL HIGH (ref 70–99)
Phosphorus: 5.4 mg/dL — ABNORMAL HIGH (ref 2.5–4.6)
Potassium: 4.5 mmol/L (ref 3.5–5.1)
Sodium: 134 mmol/L — ABNORMAL LOW (ref 135–145)

## 2020-07-27 LAB — GLUCOSE, CAPILLARY
Glucose-Capillary: 173 mg/dL — ABNORMAL HIGH (ref 70–99)
Glucose-Capillary: 330 mg/dL — ABNORMAL HIGH (ref 70–99)
Glucose-Capillary: 129 mg/dL — ABNORMAL HIGH (ref 70–99)
Glucose-Capillary: 260 mg/dL — ABNORMAL HIGH (ref 70–99)

## 2020-07-27 MED ORDER — INSULIN ASPART 100 UNIT/ML ~~LOC~~ SOLN
2.0000 [IU] | Freq: Three times a day (TID) | SUBCUTANEOUS | Status: DC
  Administered 2020-07-27 – 2020-08-01 (×14): 2 [IU] via SUBCUTANEOUS

## 2020-07-27 NOTE — Progress Notes (Signed)
Patient ID: Rachael Herrera, female   DOB: [birth year], 60 y.o.   MRN: [id number] S: No complaints.  Still a little confused and issues with aphasia O:BP (!) 145/64 (BP Location: Right Wrist)   Pulse (!) 56   Temp 98 F (36.7 C) (Oral)   Resp 15   Ht '5\' 8"'$  (1.727 m)   Wt 112.2 kg   SpO2 97%   BMI 37.60 kg/m   Intake/Output Summary (Last 24 hours) at 07/27/2020 1219 Last data filed at 07/26/2020 1800 Gross per 24 hour  Intake 240 ml  Output -  Net 240 ml   Intake/Output: I/O last 3 completed shifts: In: 840 [P.O.:840] Out: 0   Intake/Output this shift:  No intake/output data recorded. Weight change: 1.474 kg Gen: NAD CVS: bradycardic at 56 Resp: cta Abd: benign Ext: no edema, LUE AVF +T/B  Recent Labs  Lab 07/21/20 0528 07/22/20 0453 07/23/20 1300 07/23/20 1723 07/23/20 2012 07/24/20 0303 07/25/20 0726 07/26/20 0336 07/27/20 1028  NA 137 135 133* 135  --  137 129* 132* 134*  K 3.9 4.0 4.4 4.2  --  4.1 5.0 4.7 4.5  CL 96* 93* 93* 93*  --  96* 93* 93* 93*  CO2 25 25 20* 26  --  '27 22 22 24  '$ GLUCOSE 144* 112* 268* 136*  --  89 286* 249* 209*  BUN 32* 44* 61* 31*  --  32* 49* 35* 51*  CREATININE 8.14* 9.88* 12.72* 8.25*  --  9.11* 11.54* 8.35* 10.49*  ALBUMIN 3.5 3.6 3.6  --   --   --  3.4*  --  3.4*  CALCIUM 9.0 9.2 8.9 9.0  --  9.1 8.8* 9.0 9.0  PHOS 5.1* 5.2* 6.7*  --  4.6  --  6.8*  --  5.4*   Liver Function Tests: Recent Labs  Lab 07/23/20 1300 07/25/20 0726 07/27/20 1028  ALBUMIN 3.6 3.4* 3.4*   No results for input(s): LIPASE, AMYLASE in the last 168 hours. Recent Labs  Lab 07/23/20 1612  AMMONIA <9*   CBC: Recent Labs  Lab 07/23/20 1300 07/24/20 0303 07/25/20 0726 07/26/20 0336 07/27/20 1028  WBC 8.2 6.8 6.7 6.2 7.5  HGB 11.7* 12.3 11.5* 12.2 11.8*  HCT 37.1 37.8 36.9 39.0 37.1  MCV 95.6 95.5 96.9 96.8 95.6  PLT 241 272 270 227 243   Cardiac Enzymes: No results for input(s): CKTOTAL, CKMB, CKMBINDEX, TROPONINI in the last 168  hours. CBG: Recent Labs  Lab 07/26/20 1142 07/26/20 1654 07/26/20 2040 07/27/20 0724 07/27/20 1122  GLUCAP 201* 203* 211* 173* 330*    Iron Studies: No results for input(s): IRON, TIBC, TRANSFERRIN, FERRITIN in the last 72 hours. Studies/Results: No results found. .  stroke: mapping our early stages of recovery book   Does not apply Once  . amitriptyline  25 mg Oral QHS  . amLODipine  10 mg Oral Daily  . atorvastatin  80 mg Oral QPM  . cholecalciferol  1,000 Units Oral Daily  . cinacalcet  60 mg Oral Q breakfast  . gabapentin  300 mg Oral Daily  . insulin aspart  0-6 Units Subcutaneous TID WC  . insulin aspart  2 Units Subcutaneous TID WC  . insulin glargine  35 Units Subcutaneous BID  . levothyroxine  100 mcg Oral Q0600  . losartan  50 mg Oral Daily  . polyethylene glycol  17 g Oral Daily  . sevelamer carbonate  800 mg Oral TID WC  . torsemide  20  mg Oral Daily  . vitamin B-12  2,000 mcg Oral Daily  . vitamin E  400 Units Oral Daily    BMET    Component Value Date/Time   NA 134 (L) 07/27/2020 1028   K 4.5 07/27/2020 1028   CL 93 (L) 07/27/2020 1028   CO2 24 07/27/2020 1028   GLUCOSE 209 (H) 07/27/2020 1028   BUN 51 (H) 07/27/2020 1028   CREATININE 10.49 (H) 07/27/2020 1028   CALCIUM 9.0 07/27/2020 1028   GFRNONAA 4 (L) 07/27/2020 1028   CBC    Component Value Date/Time   WBC 7.5 07/27/2020 1028   RBC 3.88 07/27/2020 1028   HGB 11.8 (L) 07/27/2020 1028   HCT 37.1 07/27/2020 1028   PLT 243 07/27/2020 1028   MCV 95.6 07/27/2020 1028   MCH 30.4 07/27/2020 1028   MCHC 31.8 07/27/2020 1028   RDW 14.5 07/27/2020 1028   LYMPHSABS 1.4 07/19/2020 0505   MONOABS 1.1 (H) 07/19/2020 0505   EOSABS 0.2 07/19/2020 0505   BASOSABS 0.1 07/19/2020 0505     Assessment/Plan:  1. AMS- happened acutely with HD 07/23/20 without drop in BP. CT of head with acute parenchymal hemorrhage within the paramedian left occipital lobe at site of known subacute left PCA territory  infarct. Neuro following and recommended repeat CT scan which showed no change in hemorrhagic conversion of LMCA and PCA territory infarctions.  MS improved. 2. ESRD - cont with HD on TTS schedule 3. HTN/volume - stable 4. 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 CVA - s/p thrombectomy and was in CIR. 7. Bradycardia - has been persistent and not on beta-blocker. EP re-consulted 8. Disposition- likely transfer back to [place], MD [facility] [phone]

## 2020-07-27 NOTE — Progress Notes (Signed)
PROGRESS NOTE    Rachael Herrera  [id number] DOB: 16-Oct-1960 DOA: 07/23/2020 PCP: Patient, No Pcp Per  Brief Narrative: 60 year old female history of hypertension, type 2 diabetes mellitus with peripheral neuropathy, remote history of hypoglycemic seizure, history of tachybradycardia syndrome was admitted on 2/17 with right-sided weakness, ataxia and left gaze preference, CTA head noted left occipital infarct with a nonocclusive thrombus septic at the left MCA bifurcation, she underwent cerebral angiogram with mechanical thrombectomy and revascularization, follow-up MRI brain noted acute ischemic infarcts in left MCA/PCA territories and a small amount of petechial blood in the left basalganglia, she developed A. fib this admission seen by EP and was started on anticoagulation, she also had episodes of bradycardia, PPM was not felt to be needed at the time, continue to have global aphasia, right-sided weakness with sensory deficits and subsequently discharged to inpatient rehab on 2/24, continued to have severe nonfluent expressive aphasia but was awake alert mute and not following commands on 3/1 she became acutely unresponsive and hemodialysis, CT head noted left occipital hemorrhage at the site of prior infarct as well as petechial hemorrhage within the site of left prior left MCA stroke, neurology was consulted, CTA head noted left MCA to be patent, EEG ordered   Assessment & Plan:   Intracranial hemorrhage, acute versus subacute Encephalopathy -Admitted from rehab following an episode of unresponsiveness, CT head noted 1.9X 1.3 cm intraparenchymal hemorrhage with mild left paramedian shift, petechial hemorrhage in the left basal ganglia at the site of previously known MCA infarct -Follow-up CT head with some progression of the hemorrhage which is likely contrast, discussed with neuro MD -Eliquis discontinued -EEG is unremarkable -Mental status is improved and stable-back to baseline global  aphasia, appreciate neurology input -PT/OT/SLP following -TSH was very high, started on IV Synthroid , transitioned to p. -Plan for discharge back to CIR when bed available, per neuro plan for repeat CT head on Monday, then will decide regarding timeline for restarting anticoagulation   Bradycardia -Likely secondary to intracranial bleed -Prior history of bradycardia failed PPM insertion last admission -Appreciate EP input, PPM not felt to be indicated at this time, recommended follow-up with outpatient cardiology -Resolved  Recent stroke CTA head noted left occipital infarct with a nonocclusive thrombus septic at the left MCA bifurcation, she underwent cerebral angiogram with mechanical thrombectomy and revascularization, follow-up MRI brain noted acute ischemic infarcts in left MCA/PCA territories and a small amount of petechial blood in the left basalganglia, she developed A. fib this admission seen by EP and was started on anticoagulation -Hospitalized from 2/17 through 2/24 and then sent to CIR before readmission -Will need prolonged rehabilitation  ESRD on hemodialysis -Nephrology following  Type 1 diabetes mellitus -CBGs trending up again, increase Lantus yesterday, will add low-dose NovoLog with meals  Hypothyroidism -TSH is very high, Synthroid increased and changed to IV for now  Paroxysmal atrial fibrillation -Anticoagulation on hold, as above  DVT prophylaxis: SCDs Code Status: Full code Family Communication: No family at bedside Disposition Plan:  Status is: Inpatient  Remains inpatient appropriate because:Inpatient level of care appropriate due to severity of illness   Dispo: The patient is from:CIR              Anticipated d/c is to: CIR              Patient currently is medically stable to d/c.   Difficult to place patient No  Consultants:   Neuro  EP   Procedures:   Antimicrobials:  Subjective: -Feels okay, no events  overnight  Objective: Vitals:   07/27/20 0016 07/27/20 0416 07/27/20 0725 07/27/20 1123  BP: 137/60 (!) 152/61 (!) 150/64   Pulse: (!) 54 (!) 50 (!) 56 (!) 56  Resp: '14 17 15 15  '$ Temp: 97.8 F (36.6 C) 98 F (36.7 C) 97.6 F (36.4 C)   TempSrc: Oral Oral Axillary   SpO2: 92% 99% 94%   Weight:  112.2 kg    Height:        Intake/Output Summary (Last 24 hours) at 07/27/2020 1124 Last data filed at 07/26/2020 1800 Gross per 24 hour  Intake 480 ml  Output -  Net 480 ml   Filed Weights   07/25/20 0700 07/25/20 1025 07/27/20 0416  Weight: 111.6 kg 110.7 kg 112.2 kg    Examination:  General exam: Pleasant obese female laying in bed, awake alert, global aphasia, intermittently follows some commands CVS: S1-S2, regular rate rhythm Lungs: Decreased breath sounds the bases otherwise clear Abdomen: Soft, nontender, bowel sounds present Extremities: No edema, left arm AV fistula  Neuro: Mild right leg weakness,  4/5 on the right  Psychiatry: Flat affect    Data Reviewed:   CBC: Recent Labs  Lab 07/23/20 1300 07/24/20 0303 07/25/20 0726 07/26/20 0336 07/27/20 1028  WBC 8.2 6.8 6.7 6.2 7.5  HGB 11.7* 12.3 11.5* 12.2 11.8*  HCT 37.1 37.8 36.9 39.0 37.1  MCV 95.6 95.5 96.9 96.8 95.6  PLT 241 272 270 227 0000000   Basic Metabolic Panel: Recent Labs  Lab 07/22/20 0453 07/23/20 1300 07/23/20 1723 07/23/20 2012 07/24/20 0303 07/25/20 0726 07/26/20 0336 07/27/20 1028  NA 135 133* 135  --  137 129* 132* 134*  K 4.0 4.4 4.2  --  4.1 5.0 4.7 4.5  CL 93* 93* 93*  --  96* 93* 93* 93*  CO2 25 20* 26  --  '27 22 22 24  '$ GLUCOSE 112* 268* 136*  --  89 286* 249* 209*  BUN 44* 61* 31*  --  32* 49* 35* 51*  CREATININE 9.88* 12.72* 8.25*  --  9.11* 11.54* 8.35* 10.49*  CALCIUM 9.2 8.9 9.0  --  9.1 8.8* 9.0 9.0  MG  --   --   --  2.2  --   --   --   --   PHOS 5.2* 6.7*  --  4.6  --  6.8*  --  5.4*   GFR: Estimated Creatinine Clearance: 7.4 mL/min (A) (by C-G formula based on SCr of  10.49 mg/dL (H)). Liver Function Tests: Recent Labs  Lab 07/21/20 0528 07/22/20 0453 07/23/20 1300 07/25/20 0726 07/27/20 1028  ALBUMIN 3.5 3.6 3.6 3.4* 3.4*   No results for input(s): LIPASE, AMYLASE in the last 168 hours. Recent Labs  Lab 07/23/20 1612  AMMONIA <9*   Coagulation Profile: No results for input(s): INR, PROTIME in the last 168 hours. Cardiac Enzymes: No results for input(s): CKTOTAL, CKMB, CKMBINDEX, TROPONINI in the last 168 hours. BNP (last 3 results) No results for input(s): PROBNP in the last 8760 hours. HbA1C: No results for input(s): HGBA1C in the last 72 hours. CBG: Recent Labs  Lab 07/26/20 0757 07/26/20 1142 07/26/20 1654 07/26/20 2040 07/27/20 0724  GLUCAP 200* 201* 203* 211* 173*   Lipid Profile: No results for input(s): CHOL, HDL, LDLCALC, TRIG, CHOLHDL, LDLDIRECT in the last 72 hours. Thyroid Function Tests: No results for input(s): TSH, T4TOTAL, FREET4, T3FREE, THYROIDAB in the last 72 hours. Anemia Panel: No results for  input(s): VITAMINB12, FOLATE, FERRITIN, TIBC, IRON, RETICCTPCT in the last 72 hours. Urine analysis: No results found for: COLORURINE, APPEARANCEUR, LABSPEC, PHURINE, GLUCOSEU, HGBUR, BILIRUBINUR, KETONESUR, PROTEINUR, UROBILINOGEN, NITRITE, LEUKOCYTESUR Sepsis Labs: '@LABRCNTIP'$ (procalcitonin:4,lacticidven:4)  )No results found for this or any previous visit (from the past 240 hour(s)).   Radiology Studies: No results found. Scheduled Meds: .  stroke: mapping our early stages of recovery book   Does not apply Once  . amitriptyline  25 mg Oral QHS  . amLODipine  10 mg Oral Daily  . atorvastatin  80 mg Oral QPM  . cholecalciferol  1,000 Units Oral Daily  . cinacalcet  60 mg Oral Q breakfast  . gabapentin  300 mg Oral Daily  . insulin aspart  0-6 Units Subcutaneous TID WC  . insulin glargine  35 Units Subcutaneous BID  . levothyroxine  100 mcg Oral Q0600  . losartan  50 mg Oral Daily  . polyethylene glycol  17 g  Oral Daily  . sevelamer carbonate  800 mg Oral TID WC  . torsemide  20 mg Oral Daily  . vitamin B-12  2,000 mcg Oral Daily  . vitamin E  400 Units Oral Daily   Continuous Infusions:   LOS: 4 days    Time spent: 35mn  PDomenic Polite MD [facility]  07/27/2020, 11:24 AM

## 2020-07-27 NOTE — Progress Notes (Signed)
Daughter at bedside, patient frustrated and trying to get daughter to take her home.  Dr. Broadus John aware and updated daughter on the phone.  RN spoke to Dr. Broadus John about changing the NIH stroke scale from q4 hours to q 12 hours to help patient rest.  Order received for q12 hour change.

## 2020-07-28 LAB — GLUCOSE, CAPILLARY
Glucose-Capillary: 151 mg/dL — ABNORMAL HIGH (ref 70–99)
Glucose-Capillary: 154 mg/dL — ABNORMAL HIGH (ref 70–99)
Glucose-Capillary: 196 mg/dL — ABNORMAL HIGH (ref 70–99)
Glucose-Capillary: 248 mg/dL — ABNORMAL HIGH (ref 70–99)

## 2020-07-28 MED ORDER — LACTULOSE 10 GM/15ML PO SOLN
20.0000 g | Freq: Three times a day (TID) | ORAL | Status: AC
  Administered 2020-07-28 (×3): 20 g via ORAL
  Filled 2020-07-28 (×3): qty 30

## 2020-07-28 NOTE — Progress Notes (Signed)
Patient ID: Rachael Herrera, female   DOB: 1961/01/09, 60 y.o.   MRN: [id number] S: no complaints or events overnight O:BP (!) 151/72 (BP Location: Right Arm)   Pulse (!) 59   Temp 98.1 F (36.7 C) (Oral)   Resp 14   Ht '5\' 8"'$  (1.727 m)   Wt 110.3 kg   SpO2 95%   BMI 36.96 kg/m   Intake/Output Summary (Last 24 hours) at 07/28/2020 1152 Last data filed at 07/28/2020 0900 Gross per 24 hour  Intake 480 ml  Output 2000 ml  Net -1520 ml   Intake/Output: I/O last 3 completed shifts: In: 480 [P.O.:480] Out: 2000 [Other:2000]  Intake/Output this shift:  Total I/O In: 240 [P.O.:240] Out: -  Weight change: 0.225 kg Gen: NAD CVS: bradycardic at 59. Resp: cta Abd: benign Ext: no edema, LUE AVF +T/B  Recent Labs  Lab 07/22/20 0453 07/23/20 1300 07/23/20 1723 07/23/20 2012 07/24/20 0303 07/25/20 0726 07/26/20 0336 07/27/20 1028  NA 135 133* 135  --  137 129* 132* 134*  K 4.0 4.4 4.2  --  4.1 5.0 4.7 4.5  CL 93* 93* 93*  --  96* 93* 93* 93*  CO2 25 20* 26  --  '27 22 22 24  '$ GLUCOSE 112* 268* 136*  --  89 286* 249* 209*  BUN 44* 61* 31*  --  32* 49* 35* 51*  CREATININE 9.88* 12.72* 8.25*  --  9.11* 11.54* 8.35* 10.49*  ALBUMIN 3.6 3.6  --   --   --  3.4*  --  3.4*  CALCIUM 9.2 8.9 9.0  --  9.1 8.8* 9.0 9.0  PHOS 5.2* 6.7*  --  4.6  --  6.8*  --  5.4*   Liver Function Tests: Recent Labs  Lab 07/23/20 1300 07/25/20 0726 07/27/20 1028  ALBUMIN 3.6 3.4* 3.4*   No results for input(s): LIPASE, AMYLASE in the last 168 hours. Recent Labs  Lab 07/23/20 1612  AMMONIA <9*   CBC: Recent Labs  Lab 07/23/20 1300 07/24/20 0303 07/25/20 0726 07/26/20 0336 07/27/20 1028  WBC 8.2 6.8 6.7 6.2 7.5  HGB 11.7* 12.3 11.5* 12.2 11.8*  HCT 37.1 37.8 36.9 39.0 37.1  MCV 95.6 95.5 96.9 96.8 95.6  PLT 241 272 270 227 243   Cardiac Enzymes: No results for input(s): CKTOTAL, CKMB, CKMBINDEX, TROPONINI in the last 168 hours. CBG: Recent Labs  Lab 07/27/20 0724 07/27/20 1122  07/27/20 1653 07/27/20 2156 07/28/20 0721  GLUCAP 173* 330* 129* 260* 151*    Iron Studies: No results for input(s): IRON, TIBC, TRANSFERRIN, FERRITIN in the last 72 hours. Studies/Results: No results found. .  stroke: mapping our early stages of recovery book   Does not apply Once  . amitriptyline  25 mg Oral QHS  . amLODipine  10 mg Oral Daily  . atorvastatin  80 mg Oral QPM  . cholecalciferol  1,000 Units Oral Daily  . cinacalcet  60 mg Oral Q breakfast  . gabapentin  300 mg Oral Daily  . insulin aspart  0-6 Units Subcutaneous TID WC  . insulin aspart  2 Units Subcutaneous TID WC  . insulin glargine  35 Units Subcutaneous BID  . lactulose  20 g Oral TID  . levothyroxine  100 mcg Oral Q0600  . losartan  50 mg Oral Daily  . polyethylene glycol  17 g Oral Daily  . sevelamer carbonate  800 mg Oral TID WC  . torsemide  20 mg Oral Daily  .  vitamin B-12  2,000 mcg Oral Daily  . vitamin E  400 Units Oral Daily    BMET    Component Value Date/Time   NA 134 (L) 07/27/2020 1028   K 4.5 07/27/2020 1028   CL 93 (L) 07/27/2020 1028   CO2 24 07/27/2020 1028   GLUCOSE 209 (H) 07/27/2020 1028   BUN 51 (H) 07/27/2020 1028   CREATININE 10.49 (H) 07/27/2020 1028   CALCIUM 9.0 07/27/2020 1028   GFRNONAA 4 (L) 07/27/2020 1028   CBC    Component Value Date/Time   WBC 7.5 07/27/2020 1028   RBC 3.88 07/27/2020 1028   HGB 11.8 (L) 07/27/2020 1028   HCT 37.1 07/27/2020 1028   PLT 243 07/27/2020 1028   MCV 95.6 07/27/2020 1028   MCH 30.4 07/27/2020 1028   MCHC 31.8 07/27/2020 1028   RDW 14.5 07/27/2020 1028   LYMPHSABS 1.4 07/19/2020 0505   MONOABS 1.1 (H) 07/19/2020 0505   EOSABS 0.2 07/19/2020 0505   BASOSABS 0.1 07/19/2020 0505    Assessment/Plan:  1. AMS- happened acutely with HD 07/23/20 without drop in BP. CT of head with acute parenchymal hemorrhage within the paramedian left occipital lobe at site of known subacute left PCA territory infarct. Neuro following and  recommended repeatCT scanwhich showed no change in hemorrhagic conversion of LMCA and PCA territory infarctions. MS improved. 2. ESRD - cont with HD on TTS schedule 3. HTN/volume - stable 4. 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 CVA - s/p thrombectomy and was in CIR. 7. Bradycardia - has been persistent and not on beta-blocker. EP re-consulted 8. Disposition- likely transfer back to [place], MD [facility] [phone]

## 2020-07-28 NOTE — Progress Notes (Signed)
PROGRESS NOTE    Rachael Herrera  [id number] DOB: 1960-07-02 DOA: 07/23/2020 PCP: Patient, No Pcp Per  Brief Narrative: 60 year old female history of hypertension, type 2 diabetes mellitus with peripheral neuropathy, remote history of hypoglycemic seizure, history of tachybradycardia syndrome was admitted on 2/17 with right-sided weakness, ataxia and left gaze preference, CTA head noted left occipital infarct with a nonocclusive thrombus septic at the left MCA bifurcation, she underwent cerebral angiogram with mechanical thrombectomy and revascularization, follow-up MRI brain noted acute ischemic infarcts in left MCA/PCA territories and a small amount of petechial blood in the left basalganglia, she developed A. fib this admission seen by EP and was started on anticoagulation, she also had episodes of bradycardia, PPM was not felt to be needed at the time, continue to have global aphasia, right-sided weakness with sensory deficits and subsequently discharged to inpatient rehab on 2/24, continued to have severe nonfluent expressive aphasia but was awake alert mute and not following commands on 3/1 she became acutely unresponsive and hemodialysis, CT head noted left occipital hemorrhage at the site of prior infarct as well as petechial hemorrhage within the site of left prior left MCA stroke, neurology was consulted, CTA head noted left MCA to be patent, EEG ordered   Assessment & Plan:   Intracranial hemorrhage, acute versus subacute Encephalopathy -Admitted from rehab following an episode of unresponsiveness, CT head noted 1.9X 1.3 cm intraparenchymal hemorrhage with mild left paramedian shift, petechial hemorrhage in the left basal ganglia at the site of recent MCA infarct -Follow-up CT head with some progression of the hemorrhage which is likely contrast, per Neuro -Eliquis discontinued -EEG is unremarkable -Mental status is improved and stable-back to baseline global aphasia, appreciate neurology  input -PT/OT/SLP following -Discharge back to CIR when bed available, plan for repeat CT head on Monday per neuro and then decide about timeline to restart anticoagulation  Severe hypothyroidism -TSH was very high, started on IV Synthroid , transitioned to po  Bradycardia -Likely secondary to intracranial bleed -Prior history of bradycardia failed PPM insertion last admission -Appreciate EP input, PPM not felt to be indicated at this time, recommended follow-up with outpatient cardiology -Resolved  Recent stroke CTA head noted left occipital infarct with a nonocclusive thrombus septic at the left MCA bifurcation, she underwent cerebral angiogram with mechanical thrombectomy and revascularization, follow-up MRI brain noted acute ischemic infarcts in left MCA/PCA territories and a small amount of petechial blood in the left basalganglia, she developed A. fib this admission seen by EP and was started on anticoagulation -Hospitalized from 2/17 through 2/24 and then sent to CIR before readmission -Will need prolonged rehabilitation  ESRD on hemodialysis -Nephrology following  Type 1 diabetes mellitus -CBGs more stable now, continue higher dose of Lantus and low-dose NovoLog with meals  Paroxysmal atrial fibrillation -Anticoagulation on hold, as above  DVT prophylaxis: SCDs Code Status: Full code Family Communication: No family at bedside, updated daughter 58/5 Disposition Plan:  Status is: Inpatient  Remains inpatient appropriate because:Inpatient level of care appropriate due to severity of illness   Dispo: The patient is from:CIR              Anticipated d/c is to: CIR              Patient currently is medically stable to d/c.   Difficult to place patient No  Consultants:   Neuro  EP   Procedures:   Antimicrobials:    Subjective: -Denies any complaints, wants to go home,  Objective: Vitals:  07/28/20 0025 07/28/20 0446 07/28/20 0700 07/28/20 0821  BP: (!)  128/57 111/64 134/60 (!) 151/72  Pulse: (!) 55 (!) 57 (!) 54 (!) 59  Resp: '15 20 17 14  '$ Temp: 98 F (36.7 C) 98 F (36.7 C) 98.1 F (36.7 C) 98.1 F (36.7 C)  TempSrc: Oral Oral Axillary Oral  SpO2: 92% 95% 97% 95%  Weight:  110.3 kg    Height:        Intake/Output Summary (Last 24 hours) at 07/28/2020 1150 Last data filed at 07/28/2020 0900 Gross per 24 hour  Intake 480 ml  Output 2000 ml  Net -1520 ml   Filed Weights   07/27/20 1209 07/27/20 1514 07/28/20 0446  Weight: 112.4 kg 110.4 kg 110.3 kg    Examination:  General exam: Pleasant obese female laying in bed, awake alert, global aphasia, more appropriate conversation today CVS: S1-S2, regular rate rhythm Lungs: Decreased breath sounds to bases Abdomen: Soft obese, nontender, bowel sounds present  Extremities: No edema, left arm AV fistula  Neuro: Mild right leg weakness,  4/5 on the right  Psychiatry: Flat affect    Data Reviewed:   CBC: Recent Labs  Lab 07/23/20 1300 07/24/20 0303 07/25/20 0726 07/26/20 0336 07/27/20 1028  WBC 8.2 6.8 6.7 6.2 7.5  HGB 11.7* 12.3 11.5* 12.2 11.8*  HCT 37.1 37.8 36.9 39.0 37.1  MCV 95.6 95.5 96.9 96.8 95.6  PLT 241 272 270 227 0000000   Basic Metabolic Panel: Recent Labs  Lab 07/22/20 0453 07/23/20 1300 07/23/20 1723 07/23/20 2012 07/24/20 0303 07/25/20 0726 07/26/20 0336 07/27/20 1028  NA 135 133* 135  --  137 129* 132* 134*  K 4.0 4.4 4.2  --  4.1 5.0 4.7 4.5  CL 93* 93* 93*  --  96* 93* 93* 93*  CO2 25 20* 26  --  '27 22 22 24  '$ GLUCOSE 112* 268* 136*  --  89 286* 249* 209*  BUN 44* 61* 31*  --  32* 49* 35* 51*  CREATININE 9.88* 12.72* 8.25*  --  9.11* 11.54* 8.35* 10.49*  CALCIUM 9.2 8.9 9.0  --  9.1 8.8* 9.0 9.0  MG  --   --   --  2.2  --   --   --   --   PHOS 5.2* 6.7*  --  4.6  --  6.8*  --  5.4*   GFR: Estimated Creatinine Clearance: 7.3 mL/min (A) (by C-G formula based on SCr of 10.49 mg/dL (H)). Liver Function Tests: Recent Labs  Lab 07/22/20 0453  07/23/20 1300 07/25/20 0726 07/27/20 1028  ALBUMIN 3.6 3.6 3.4* 3.4*   No results for input(s): LIPASE, AMYLASE in the last 168 hours. Recent Labs  Lab 07/23/20 1612  AMMONIA <9*   Coagulation Profile: No results for input(s): INR, PROTIME in the last 168 hours. Cardiac Enzymes: No results for input(s): CKTOTAL, CKMB, CKMBINDEX, TROPONINI in the last 168 hours. BNP (last 3 results) No results for input(s): PROBNP in the last 8760 hours. HbA1C: No results for input(s): HGBA1C in the last 72 hours. CBG: Recent Labs  Lab 07/27/20 0724 07/27/20 1122 07/27/20 1653 07/27/20 2156 07/28/20 0721  GLUCAP 173* 330* 129* 260* 151*   Lipid Profile: No results for input(s): CHOL, HDL, LDLCALC, TRIG, CHOLHDL, LDLDIRECT in the last 72 hours. Thyroid Function Tests: No results for input(s): TSH, T4TOTAL, FREET4, T3FREE, THYROIDAB in the last 72 hours. Anemia Panel: No results for input(s): VITAMINB12, FOLATE, FERRITIN, TIBC, IRON, RETICCTPCT in the last  72 hours. Urine analysis: No results found for: COLORURINE, APPEARANCEUR, LABSPEC, PHURINE, GLUCOSEU, HGBUR, BILIRUBINUR, KETONESUR, PROTEINUR, UROBILINOGEN, NITRITE, LEUKOCYTESUR Sepsis Labs: '@LABRCNTIP'$ (procalcitonin:4,lacticidven:4)  )No results found for this or any previous visit (from the past 240 hour(s)).   Radiology Studies: No results found. Scheduled Meds: .  stroke: mapping our early stages of recovery book   Does not apply Once  . amitriptyline  25 mg Oral QHS  . amLODipine  10 mg Oral Daily  . atorvastatin  80 mg Oral QPM  . cholecalciferol  1,000 Units Oral Daily  . cinacalcet  60 mg Oral Q breakfast  . gabapentin  300 mg Oral Daily  . insulin aspart  0-6 Units Subcutaneous TID WC  . insulin aspart  2 Units Subcutaneous TID WC  . insulin glargine  35 Units Subcutaneous BID  . lactulose  20 g Oral TID  . levothyroxine  100 mcg Oral Q0600  . losartan  50 mg Oral Daily  . polyethylene glycol  17 g Oral Daily  .  sevelamer carbonate  800 mg Oral TID WC  . torsemide  20 mg Oral Daily  . vitamin B-12  2,000 mcg Oral Daily  . vitamin E  400 Units Oral Daily   Continuous Infusions:   LOS: 5 days    Time spent: 74mn  PDomenic Polite MD [facility]  07/28/2020, 11:50 AM

## 2020-07-28 NOTE — Plan of Care (Signed)
Pt admitted in 06/2020 for left MCA and PCA infarcts due to left M2 occlusion s/p IR with TICI 3. Found to have Aflutter during admission, was put on heparin IV and then transitioned to eliquis. Pt then discharged to CIR with aphasia and right hemiplegia. While in CIR having HD on 07/24/20, pt had acute neuro change with unresponsive. CT head showed hemorrhagic conversion, but looks older, may not explain the acute mental status change. EEG no seizure. The mental status change could be related to dialysis. But anyway, Dr. Leonel Ramsay has discussed with Dr. Leonie Man, will repeat CT head Monday morning and will decide on the timing to resume eliquis, and then pt can go back to CIR.   [name], MD PhD Stroke Neurology 07/28/2020 7:48 PM

## 2020-07-28 NOTE — Progress Notes (Signed)
Patient has not had BM for 6 days.  Bowel sounds active in all 4 quadrants, abdomen soft to palpation and non-tender. Dr. Broadus John notified, order for lactulose.  Will monitor for results.

## 2020-07-29 ENCOUNTER — Inpatient Hospital Stay (HOSPITAL_COMMUNITY): Payer: Medicare Other

## 2020-07-29 DIAGNOSIS — I611 Nontraumatic intracerebral hemorrhage in hemisphere, cortical: Secondary | ICD-10-CM

## 2020-07-29 DIAGNOSIS — I619 Nontraumatic intracerebral hemorrhage, unspecified: Secondary | ICD-10-CM | POA: Insufficient documentation

## 2020-07-29 LAB — GLUCOSE, CAPILLARY
Glucose-Capillary: 280 mg/dL — ABNORMAL HIGH (ref 70–99)
Glucose-Capillary: 309 mg/dL — ABNORMAL HIGH (ref 70–99)
Glucose-Capillary: 304 mg/dL — ABNORMAL HIGH (ref 70–99)
Glucose-Capillary: 171 mg/dL — ABNORMAL HIGH (ref 70–99)

## 2020-07-29 MED ORDER — APIXABAN 5 MG PO TABS: 5.0000 mg | ORAL_TABLET | Freq: Two times a day (BID) | ORAL | 2 refills | Status: AC

## 2020-07-29 MED ORDER — POLYETHYLENE GLYCOL 3350 17 G PO PACK: 17.0000 g | PACK | Freq: Every day | ORAL | 0 refills | Status: DC

## 2020-07-29 MED ORDER — INSULIN ASPART 100 UNIT/ML ~~LOC~~ SOLN: 2.0000 [IU] | Freq: Three times a day (TID) | SUBCUTANEOUS | 11 refills | Status: DC

## 2020-07-29 MED ORDER — INSULIN GLARGINE 100 UNIT/ML ~~LOC~~ SOLN: 35.0000 [IU] | Freq: Two times a day (BID) | SUBCUTANEOUS | 11 refills | Status: DC

## 2020-07-29 MED ORDER — LEVOTHYROXINE SODIUM 100 MCG PO TABS: 100.0000 ug | ORAL_TABLET | Freq: Every day | ORAL | 1 refills | Status: DC

## 2020-07-29 MED ORDER — APIXABAN 5 MG PO TABS
5.0000 mg | ORAL_TABLET | Freq: Two times a day (BID) | ORAL | Status: DC
  Administered 2020-07-29 – 2020-08-01 (×7): 5 mg via ORAL
  Filled 2020-07-29 (×7): qty 1

## 2020-07-29 MED ORDER — SENNOSIDES-DOCUSATE SODIUM 8.6-50 MG PO TABS: 1.0000 | ORAL_TABLET | Freq: Every evening | ORAL | 0 refills | Status: DC | PRN

## 2020-07-29 NOTE — TOC Initial Note (Signed)
Transition of Care [facility]) - Initial/Assessment Note    Patient Details  Name: Rachael Herrera MRN: [id number] Date of Birth: [birth year]  Transition of [facility] CM/SW Contact:    Bethena Roys, RN Phone Number: 07/29/2020, 12:54 PM  Clinical Narrative: Patient presented from Inpatient Rehab for bradycardia. Prior to CIR patient was from home with the support of daughter. Case Manager reached out to [facility] Admissions Coordinator and patient has been denied via insurance to return to CIR. Case Manager relayed information to daughter Rachael Herrera that is at the bedside. Case Manager provided patient and daughter the opportunity to appeal the decision for CIR- family declined the option to appeal. Family wants to pursue SNF and the patient is agreeable. Case Manager offered Medicare.gov list and patient and daughter chose [facility], [address], and [facility]. Patient and daughter is aware that if these facilities are unable to offer a bed we will need to expand the search. Clinical Social Worker to continue to follow up with this patient.                   Expected Discharge Plan: Skilled Nursing Facility Barriers to Discharge: No Barriers Identified   Patient Goals and CMS Choice Patient states their goals for this hospitalization and ongoing recovery are:: to go to SNF to get some rehab CMS Medicare.gov Compare Post Acute Care list provided to:: Patient Choice offered to / list presented to : Adult Children,Patient  Expected Discharge Plan and Services Expected Discharge Plan: [place] In-house Referral: Clinical Social Work Discharge Planning Services: CM Consult Post Acute Care Choice: [place] arrangements for the past 2 months:  (Patient was previously at [facility] inpatient rehab) Expected Discharge Date: 07/29/20               DME Arranged: N/A DME Agency: NA       HH Arranged: NA [place] Agency: NA        Prior  Living Arrangements/Services Living arrangements for the past 2 months:  (Patient was previously at [facility] inpatient rehab) Lives with:: Adult Children   Do you feel safe going back to the place where you live?: Yes      Need for Family Participation in Patient Care: Yes (Comment) Care giver support system in place?: Yes (comment)   Criminal Activity/Legal Involvement Pertinent to Current Situation/Hospitalization: No - Comment as needed  Activities of Daily Living Home Assistive Devices/Equipment: None ADL Screening (condition at time of admission) Patient's cognitive ability adequate to safely complete daily activities?: Yes Is the patient deaf or have difficulty hearing?: No Does the patient have difficulty seeing, even when wearing glasses/contacts?: No Does the patient have difficulty concentrating, remembering, or making decisions?: No Patient able to express need for assistance with ADLs?: Yes Does the patient have difficulty dressing or bathing?: No Independently performs ADLs?: No Communication: Independent Is this a change from baseline?: Pre-admission baseline Dressing (OT): Needs assistance Is this a change from baseline?: Pre-admission baseline Grooming: Needs assistance Is this a change from baseline?: Pre-admission baseline Feeding: Needs assistance Is this a change from baseline?: Pre-admission baseline Bathing: Needs assistance Is this a change from baseline?: Pre-admission baseline Toileting: Needs assistance Is this a change from baseline?: Pre-admission baseline In/Out Bed: Needs assistance Is this a change from baseline?: Pre-admission baseline Is this a change from baseline?: Pre-admission baseline Does the patient have difficulty walking or climbing stairs?: Yes Weakness of Legs: Right Weakness of Arms/Hands: Right  Permission  Sought/Granted Permission sought to share information with : Family Biomedical scientist granted to share information with : Yes, Verbal Permission Granted     Permission granted to share info w AGENCY: [facility], [facility], and [facility]        Emotional Assessment Appearance:: Appears stated age Attitude/Demeanor/Rapport: Engaged Affect (typically observed): Appropriate Orientation: : Oriented to Situation,Oriented to  Time,Oriented to Place,Oriented to Self Alcohol / Substance Use: Not Applicable Psych Involvement: No (comment)  Admission diagnosis:  Bradycardia [R00.1] Patient Active Problem List   Diagnosis Date Noted  . Intracerebral hemorrhage 07/29/2020  . ESRD on dialysis ([place]) 07/23/2020  . Bradycardia 07/23/2020  . 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  . Acute ischemic left MCA stroke ([place]) 07/18/2020  . Cerebral embolism with cerebral infarction 07/11/2020  . Stroke ([place]) 07/11/2020  . Comprehensive diabetic foot examination, type 2 DM, encounter for ([place]) 09/25/2019  . OSA (obstructive sleep apnea) 11/21/2018  . Acquired hypothyroidism 05/04/2014  . Chronic kidney disease, stage IV (severe) ([place]) 05/04/2014  . Essential hypertension 05/04/2014  . 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 Obesity 05/04/2014   PCP:  Patient, No Pcp Per Pharmacy:   [facility] [place] [place] [address] [place] [address] Phone: [phone] Fax: [phone]   Readmission Risk Interventions No flowsheet data found.

## 2020-07-29 NOTE — Progress Notes (Signed)
Signed out to check repeat CTH and determine timing of anticoagulation resumption. CTH reviewed and shows continuing resolution of the hematoma seen in the LMCA/PCA territories.  Recs: -CIR -Resume Eliquis per  pharmacy consult.  Please call stroke team as needed.  -- Amie [place], MD Stroke Team Neurologist Pager: [phone]

## 2020-07-29 NOTE — Care Management Important Message (Signed)
Important Message  Patient Details  Name: Rachael Herrera MRN: [id number] Date of Birth: 1960-06-18   Medicare Important Message Given:  Yes     [name] 07/29/2020, 10:35 AM

## 2020-07-29 NOTE — Progress Notes (Signed)
Patient ID: Rachael Herrera, female   DOB: Jan 25, 1961, 60 y.o.   MRN: [id number] S: no complaints or events overnight O:BP (!) 148/65 (BP Location: Right Arm)   Pulse (!) 51   Temp 98.5 F (36.9 C) (Oral)   Resp 16   Ht '5\' 8"'$  (1.727 m)   Wt 110.2 kg   SpO2 99%   BMI 36.95 kg/m   Intake/Output Summary (Last 24 hours) at 07/29/2020 1205 Last data filed at 07/28/2020 2200 Gross per 24 hour  Intake 360 ml  Output -  Net 360 ml   Intake/Output: I/O last 3 completed shifts: In: 840 [P.O.:840] Out: -   Intake/Output this shift:  No intake/output data recorded. Weight change: -2.176 kg Gen: NAD CVS: bradycardic Resp: Lateral chest rise, no increased work of breathing Abd: benign Ext: no edema, LUE AVF +T/B  Recent Labs  Lab 07/23/20 1300 07/23/20 1723 07/23/20 2012 07/24/20 0303 07/25/20 0726 07/26/20 0336 07/27/20 1028  NA 133* 135  --  137 129* 132* 134*  K 4.4 4.2  --  4.1 5.0 4.7 4.5  CL 93* 93*  --  96* 93* 93* 93*  CO2 20* 26  --  '27 22 22 24  '$ GLUCOSE 268* 136*  --  89 286* 249* 209*  BUN 61* 31*  --  32* 49* 35* 51*  CREATININE 12.72* 8.25*  --  9.11* 11.54* 8.35* 10.49*  ALBUMIN 3.6  --   --   --  3.4*  --  3.4*  CALCIUM 8.9 9.0  --  9.1 8.8* 9.0 9.0  PHOS 6.7*  --  4.6  --  6.8*  --  5.4*   Liver Function Tests: Recent Labs  Lab 07/23/20 1300 07/25/20 0726 07/27/20 1028  ALBUMIN 3.6 3.4* 3.4*   No results for input(s): LIPASE, AMYLASE in the last 168 hours. Recent Labs  Lab 07/23/20 1612  AMMONIA <9*   CBC: Recent Labs  Lab 07/23/20 1300 07/24/20 0303 07/25/20 0726 07/26/20 0336 07/27/20 1028  WBC 8.2 6.8 6.7 6.2 7.5  HGB 11.7* 12.3 11.5* 12.2 11.8*  HCT 37.1 37.8 36.9 39.0 37.1  MCV 95.6 95.5 96.9 96.8 95.6  PLT 241 272 270 227 243   Cardiac Enzymes: No results for input(s): CKTOTAL, CKMB, CKMBINDEX, TROPONINI in the last 168 hours. CBG: Recent Labs  Lab 07/28/20 1216 07/28/20 1619 07/28/20 2144 07/29/20 0736 07/29/20 1155  GLUCAP  154* 196* 248* 280* 309*    Iron Studies: No results for input(s): IRON, TIBC, TRANSFERRIN, FERRITIN in the last 72 hours. Studies/Results: CT HEAD WO CONTRAST  Result Date: 07/29/2020 CLINICAL DATA:  Stroke with hemorrhage, follow-up EXAM: CT HEAD WITHOUT CONTRAST TECHNIQUE: Contiguous axial images were obtained from the base of the skull through the vertex without intravenous contrast. COMPARISON:  07/25/2020 FINDINGS: Brain: Evolving areas of infarction are again identified in the left MCA and PCA territories with hemorrhage. There is no new hemorrhage or loss of gray-white differentiation. Mass effect on the left lateral ventricle is slightly decreased. No midline shift or hydrocephalus. Vascular: No new findings. Skull: Unremarkable. Sinuses/Orbits: Persistent right posterior ethmoid opacification. No new orbital finding. Other: None. IMPRESSION: Expected evolution of areas of left MCA and PCA infarction with hemorrhage. Decreased mild mass effect. No new findings. Electronically Signed   By: Macy Mis M.D.   On: 07/29/2020 08:10   .  stroke: mapping our early stages of recovery book   Does not apply Once  . amitriptyline  25 mg Oral QHS  .  amLODipine  10 mg Oral Daily  . apixaban  5 mg Oral BID  . atorvastatin  80 mg Oral QPM  . cholecalciferol  1,000 Units Oral Daily  . cinacalcet  60 mg Oral Q breakfast  . gabapentin  300 mg Oral Daily  . insulin aspart  0-6 Units Subcutaneous TID WC  . insulin aspart  2 Units Subcutaneous TID WC  . insulin glargine  35 Units Subcutaneous BID  . levothyroxine  100 mcg Oral Q0600  . losartan  50 mg Oral Daily  . polyethylene glycol  17 g Oral Daily  . sevelamer carbonate  800 mg Oral TID WC  . torsemide  20 mg Oral Daily  . vitamin B-12  2,000 mcg Oral Daily  . vitamin E  400 Units Oral Daily    BMET    Component Value Date/Time   NA 134 (L) 07/27/2020 1028   K 4.5 07/27/2020 1028   CL 93 (L) 07/27/2020 1028   CO2 24 07/27/2020 1028    GLUCOSE 209 (H) 07/27/2020 1028   BUN 51 (H) 07/27/2020 1028   CREATININE 10.49 (H) 07/27/2020 1028   CALCIUM 9.0 07/27/2020 1028   GFRNONAA 4 (L) 07/27/2020 1028   CBC    Component Value Date/Time   WBC 7.5 07/27/2020 1028   RBC 3.88 07/27/2020 1028   HGB 11.8 (L) 07/27/2020 1028   HCT 37.1 07/27/2020 1028   PLT 243 07/27/2020 1028   MCV 95.6 07/27/2020 1028   MCH 30.4 07/27/2020 1028   MCHC 31.8 07/27/2020 1028   RDW 14.5 07/27/2020 1028   LYMPHSABS 1.4 07/19/2020 0505   MONOABS 1.1 (H) 07/19/2020 0505   EOSABS 0.2 07/19/2020 0505   BASOSABS 0.1 07/19/2020 0505    Assessment/Plan:  1. AMS- happened acutely with HD 07/23/20 without drop in BP. CT of head with acute parenchymal hemorrhage within the paramedian left occipital lobe at site of known subacute left PCA territory infarct. Neuro following and recommended repeatCT scanwhich showed no change in hemorrhagic conversion of LMCA and PCA territory infarctions. MS improved.  Continue to monitor 2. ESRD - cont with HD on TTS schedule 3. HTN/volume - stable 4. 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 CVA - s/p thrombectomy and was in CIR. 7. Bradycardia - has been persistent and not on beta-blocker. Cardiology has consulted 8. Disposition- likely transfer back to CIR

## 2020-07-29 NOTE — NC FL2 (Signed)
[place] MEDICAID FL2 LEVEL OF CARE SCREENING TOOL     IDENTIFICATION  Patient Name: Rachael Herrera Birthdate: 12-May-1961 Sex: female Admission Date (Current Location): 07/23/2020  [facility] and [place] Number:  Herbalist and Address:  [facility] [place], [place] [address], [place], [place] [address]      Provider Number: [id number]  Attending Physician Name and Address:  Elmarie Shiley, MD  Relative Name and Phone Number:  Tamela Oddi [phone]    Current Level of Care: Hospital Recommended Level of Care: [place] Prior Approval Number:    Date Approved/Denied:   PASRR Number: [id number]  Discharge Plan: SNF    Current Diagnoses: Patient Active Problem List   Diagnosis Date Noted  . Intracerebral hemorrhage 07/29/2020  . ESRD on dialysis ([place]) 07/23/2020  . Bradycardia 07/23/2020  . 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  . Acute ischemic left MCA stroke ([place]) 07/18/2020  . Cerebral embolism with cerebral infarction 07/11/2020  . Stroke ([place]) 07/11/2020  . Comprehensive diabetic foot examination, type 2 DM, encounter for ([place]) 09/25/2019  . OSA (obstructive sleep apnea) 11/21/2018  . Acquired hypothyroidism 05/04/2014  . Chronic kidney disease, stage IV (severe) ([place]) 05/04/2014  . Essential hypertension 05/04/2014  . Hyperlipidemia due to type 1 diabetes mellitus ([place]) 05/04/2014  . Obesity 05/04/2014    Orientation RESPIRATION BLADDER Height & Weight     Self  Normal Continent Weight: 243 lb (110.2 kg) Height:  '5\' 8"'$  (172.7 cm)  BEHAVIORAL SYMPTOMS/MOOD NEUROLOGICAL BOWEL NUTRITION STATUS      Incontinent Diet (See Discharge Summary)  AMBULATORY STATUS COMMUNICATION OF NEEDS Skin   Limited Assist Verbally Skin abrasions,Other (Comment) (Abrasion arm bilateral,ecchymosis arm left)                       Personal Care Assistance Level of Assistance  Bathing,Feeding,Dressing Bathing Assistance:  Limited assistance Feeding assistance: Limited assistance (Needs set up;Fluid restrictions) Dressing Assistance: Limited assistance     Functional Limitations Info  Sight,Hearing,Speech Sight Info: Adequate Hearing Info: Adequate Speech Info: Impaired (difficulty speaking)    SPECIAL CARE FACTORS FREQUENCY  PT (By licensed PT),OT (By licensed OT)     PT Frequency: 5x min weekly OT Frequency: 5x min weekly            Contractures Contractures Info: Not present    Additional Factors Info  Code Status,Insulin Sliding Scale Code Status Info: FULL     Insulin Sliding Scale Info: insulin aspart (novoLOG) injection 0-6 Units 3 times daily with meals,insulin aspart (novoLOG) injection 2 Units 3 times daily with meals,insulin glargine (LANTUS) injection 35 Units 2 times daily       Current Medications (07/29/2020):  This is the current hospital active medication list Current Facility-Administered Medications  Medication Dose Route Frequency Provider Last Rate Last Admin  .  stroke: mapping our early stages of recovery book   Does not apply Once Wynetta Fines T, MD      . acetaminophen (TYLENOL) tablet 650 mg  650 mg Oral Q4H PRN Lequita Halt, MD       Or  . acetaminophen (TYLENOL) 160 MG/5ML solution 650 mg  650 mg Per Tube Q4H PRN Wynetta Fines T, MD       Or  . acetaminophen (TYLENOL) suppository 650 mg  650 mg Rectal Q4H PRN Wynetta Fines T, MD      . amitriptyline (ELAVIL) tablet 25 mg  25 mg Oral QHS Zhang,  Ralene Cork, MD   25 mg at 07/28/20 2113  . amLODipine (NORVASC) tablet 10 mg  10 mg Oral Daily Wynetta Fines T, MD   10 mg at 07/29/20 0831  . apixaban (ELIQUIS) tablet 5 mg  5 mg Oral BID Kris Mouton, RPH   5 mg at 07/29/20 [id number]  . atorvastatin (LIPITOR) tablet 80 mg  80 mg Oral QPM Wynetta Fines T, MD   80 mg at 07/28/20 1711  . cholecalciferol (VITAMIN D3) tablet 1,000 Units  1,000 Units Oral Daily Lequita Halt, MD   1,000 Units at 07/29/20 [phone]  . cinacalcet (SENSIPAR) tablet  60 mg  60 mg Oral Q breakfast Wynetta Fines T, MD   60 mg at 07/29/20 0831  . gabapentin (NEURONTIN) capsule 300 mg  300 mg Oral Daily Wynetta Fines T, MD   300 mg at 07/29/20 0831  . hydrALAZINE (APRESOLINE) injection 5 mg  5 mg Intravenous Q4H PRN Wynetta Fines T, MD      . insulin aspart (novoLOG) injection 0-6 Units  0-6 Units Subcutaneous TID WC Lequita Halt, MD   4 Units at 07/29/20 1256  . insulin aspart (novoLOG) injection 2 Units  2 Units Subcutaneous TID WC Domenic Polite, MD   2 Units at 07/29/20 1256  . insulin glargine (LANTUS) injection 35 Units  35 Units Subcutaneous BID Domenic Polite, MD   35 Units at 07/29/20 [id number]  . levothyroxine (SYNTHROID) tablet 100 mcg  100 mcg Oral Q0600 Domenic Polite, MD   100 mcg at 07/29/20 0831  . losartan (COZAAR) tablet 50 mg  50 mg Oral Daily Wynetta Fines T, MD   50 mg at 07/29/20 0831  . polyethylene glycol (MIRALAX / GLYCOLAX) packet 17 g  17 g Oral Daily Domenic Polite, MD   17 g at 07/29/20 0831  . senna-docusate (Senokot-S) tablet 1 tablet  1 tablet Oral QHS PRN Wynetta Fines T, MD      . sevelamer carbonate (RENVELA) tablet 800 mg  800 mg Oral TID WC Wynetta Fines T, MD   800 mg at 07/29/20 1256  . torsemide (DEMADEX) tablet 20 mg  20 mg Oral Daily Wynetta Fines T, MD   20 mg at 07/29/20 0831  . vitamin B-12 (CYANOCOBALAMIN) tablet 2,000 mcg  2,000 mcg Oral Daily Wynetta Fines T, MD   2,000 mcg at 07/29/20 0831  . vitamin E capsule 400 Units  400 Units Oral Daily Lequita Halt, MD   400 Units at 07/29/20 0831     Discharge Medications: Please see discharge summary for a list of discharge medications.  Relevant Imaging Results:  Relevant Lab Results:   Additional Information [id number]  Trula Ore, LCSWA

## 2020-07-29 NOTE — Progress Notes (Signed)
Physical Therapy Treatment Patient Details Name: Rachael Herrera MRN: [id number] DOB: [birth year] Today's Date: 07/29/2020    History of Present Illness Pt readmitted to acute from CIR on 07/23/20 after with acute mental status change during dialysis. Was in [place] for recent L MCA and PCA infarcts.  Initially thought to have new hemorrhagic stroke but neuro reports the following. - "She does have some hemorrhagic transformation of her stroke, but this was present on her MRI several weeks ago, just more confluent in the posterior aspect.  The CT 3/1 demonstrated enhancement of the entire stroke bed, but I do not feel that this was worsening petechial hemorrhage as it was more consistent with contrast enhancement.    PMHx:   thrombectomy 2/17. DM II, OSA, CKD IV, HTN, HLD, and morbid obesity.    PT Comments    Pt continues to make steady progress. Continues to have balance, mobility and cognitive issues. Continue to recommend return to rehab.    Follow Up Recommendations  CIR     Equipment Recommendations  None recommended by PT    Recommendations for Other Services       Precautions / Restrictions Precautions Precautions: Fall Precaution Comments: R inattention    Mobility  Bed Mobility Overal bed mobility: Needs Assistance Bed Mobility: Supine to Sit     Supine to sit: Min guard;HOB elevated     General bed mobility comments: Assist for safety and incr time    Transfers Overall transfer level: Needs assistance Equipment used: Rolling walker (2 wheeled) Transfers: Sit to/from Stand Sit to Stand: Min assist         General transfer comment: Assist for balance  Ambulation/Gait Ambulation/Gait assistance: Min assist Gait Distance (Feet): 175 Feet Assistive device: Rolling walker (2 wheeled) Gait Pattern/deviations: Step-through pattern;Decreased stride length;Wide base of support;Trunk flexed Gait velocity: decr Gait velocity interpretation: 1.31 - 2.62 ft/sec, indicative  of limited community ambulator General Gait Details: Assist for balance. Assist to avoid obstacles on the right. Verbal cues to stay closer to walker   Stairs             Wheelchair Mobility    Modified Rankin (Stroke Patients Only) Modified Rankin (Stroke Patients Only) Pre-Morbid Rankin Score: No significant disability Modified Rankin: Moderately severe disability     Balance Overall balance assessment: Needs assistance Sitting-balance support: Feet supported Sitting balance-Leahy Scale: Fair     Standing balance support: Single extremity supported Standing balance-Leahy Scale: Poor Standing balance comment: UE support                            Cognition Arousal/Alertness: Awake/alert Behavior During Therapy: Impulsive;Flat affect Overall Cognitive Status: Difficult to assess Area of Impairment: Problem solving;Awareness;Safety/judgement;Following commands;Memory;Attention;Orientation                   Current Attention Level: Selective Memory: Decreased short-term memory;Decreased recall of precautions Following Commands: Follows one step commands inconsistently;Follows one step commands with increased time Safety/Judgement: Decreased awareness of safety;Decreased awareness of deficits   Problem Solving: Slow processing;Requires verbal cues;Requires tactile cues        Exercises      General Comments        Pertinent Vitals/Pain Pain Assessment: Faces Faces Pain Scale: No hurt    Home Living                      Prior Function  PT Goals (current goals can now be found in the care plan section) Acute Rehab PT Goals Patient Stated Goal: family wants return to CIR, pt in agreement Progress towards PT goals: Progressing toward goals    Frequency    Min 4X/week      PT Plan Current plan remains appropriate    Co-evaluation              AM-PAC PT "6 Clicks" Mobility   Outcome Measure  Help  needed turning from your back to your side while in a flat bed without using bedrails?: A Little Help needed moving from lying on your back to sitting on the side of a flat bed without using bedrails?: A Little Help needed moving to and from a bed to a chair (including a wheelchair)?: A Little Help needed standing up from a chair using your arms (e.g., wheelchair or bedside chair)?: A Little Help needed to walk in hospital room?: A Little Help needed climbing 3-5 steps with a railing? : A Lot 6 Click Score: 17    End of Session Equipment Utilized During Treatment: Gait belt Activity Tolerance: Patient tolerated treatment well Patient left: in chair;with call bell/phone within reach;with chair alarm set Nurse Communication: Mobility status PT Visit Diagnosis: Muscle weakness (generalized) (M62.81);Other abnormalities of gait and mobility (R26.89);Unsteadiness on feet (R26.81);Difficulty in walking, not elsewhere classified (R26.2);Hemiplegia and hemiparesis Hemiplegia - dominant/non-dominant: Dominant Hemiplegia - caused by: Nontraumatic intracerebral hemorrhage     Time: [id number] PT Time Calculation (min) (ACUTE ONLY): 16 min  Charges:  $Gait Training: 8-22 mins                     [place] Pager [phone] Office [place] 07/29/2020, 10:32 AM

## 2020-07-29 NOTE — Progress Notes (Signed)
ANTICOAGULATION CONSULT NOTE - Follow Up Consult  Pharmacy Consult for apixaban Indication: atrial fibrillation  No Known Allergies  Patient Measurements: Height: '5\' 8"'$  (172.7 cm) Weight: 110.2 kg (243 lb) IBW/kg (Calculated) : 63.9   Vital Signs: Temp: 98.5 F (36.9 C) (03/07 0433) Temp Source: Oral (03/07 0433) BP: 148/65 (03/07 0433) Pulse Rate: 51 (03/07 0433)  Labs: Recent Labs    07/27/20 1028  HGB 11.8*  HCT 37.1  PLT 243  CREATININE 10.49*    Estimated Creatinine Clearance: 7.3 mL/min (A) (by C-G formula based on SCr of 10.49 mg/dL (H)).   Medications:  Medications Prior to Admission  Medication Sig Dispense Refill Last Dose  . amitriptyline (ELAVIL) 25 MG tablet Take 25 mg by mouth at bedtime.     Marland Kitchen amLODipine (NORVASC) 10 MG tablet Take 10 mg by mouth daily.     Marland Kitchen atorvastatin (LIPITOR) 80 MG tablet Take 80 mg by mouth every evening.     . celecoxib (CELEBREX) 100 MG capsule Take 100 mg by mouth daily.     . cholecalciferol (VITAMIN D3) 25 MCG (1000 UNIT) tablet Take 1,000 Units by mouth daily.     . cinacalcet (SENSIPAR) 60 MG tablet Take 60 mg by mouth daily.     Marland Kitchen gabapentin (NEURONTIN) 300 MG capsule Take 300 mg by mouth daily.     Marland Kitchen HUMALOG 100 UNIT/ML injection Inject 3-13 Units into the skin 3 (three) times daily before meals. Per sliding scale     . insulin glargine (LANTUS) 100 UNIT/ML Solostar Pen Inject 80 Units into the skin at bedtime.     Marland Kitchen losartan (COZAAR) 50 MG tablet Take 50 mg by mouth daily.     . sevelamer carbonate (RENVELA) 800 MG tablet Take 800 mg by mouth 3 (three) times daily.     Marland Kitchen SYNTHROID 75 MCG tablet Take 75 mcg by mouth daily.     Marland Kitchen torsemide (DEMADEX) 20 MG tablet Take 20 mg by mouth daily.     . vitamin B-12 (CYANOCOBALAMIN) 1000 MCG tablet Take 2,000 mcg by mouth daily.     . vitamin E 180 MG (400 UNITS) capsule Take 400 Units by mouth daily.       Assessment: 60 yo female with CVA and afib and started on apixaban on  2/22. A CT showed concern hemorrhagic conversion and anticoagulation has been on hold. She is noted with ESRD on HD TTS. Pharmacy consulted to resume today -Hg= 11.8   Goal of Therapy:  Monitor platelets by anticoagulation protocol: Yes   Plan:  -apixaban '5mg'$  po bid - Will follow peripherally  Hildred Laser, PharmD Clinical Pharmacist **Pharmacist phone directory can now be found on [email or web address] ([email or web address]).  Listed under [place].

## 2020-07-29 NOTE — Discharge Summary (Addendum)
Physician Discharge Summary  CLERA NEESON [id number] DOB: 01/12/1961 DOA: 07/23/2020  PCP: Patient, No Pcp Per (Inactive)  Admit date: 07/23/2020 Discharge date: 08/01/2020  Admitted From: CIR Disposition: SNF  Recommendations for Outpatient Follow-up:  1. Follow up with PCP in 1-2 weeks 2. Please obtain BMP/CBC in one week 3. Follow up wit cardiology for management of A fib.  4.   Discharge Condition: Stable.  CODE STATUS: Full code Diet recommendation: Heart Healthy   Brief/Interim Summary:  60 year old female history of hypertension, type 2 diabetes mellitus with peripheral neuropathy, remote history of hypoglycemic seizure, history of tachybradycardia syndrome was admitted on 2/17 with right-sided weakness, ataxia and left gaze preference, CTA head noted left occipital infarct with a nonocclusive thrombus septic at the left MCA bifurcation, she underwent cerebral angiogram with mechanical thrombectomy and revascularization, follow-up MRI brain noted acute ischemic infarcts in left MCA/PCA territories and a small amount of petechial blood in the left basalganglia, she developed A. fib this admission seen by EP and was started on anticoagulation, she also had episodes of bradycardia, PPM was not felt to be needed at the time, continue to have global aphasia, right-sided weakness with sensory deficits and subsequently discharged to inpatient rehab on 2/24, continued to have severe nonfluent expressive aphasia but was awake alert mute and not following commands on 3/1 she became acutely unresponsive and hemodialysis, CT head noted left occipital hemorrhage at the site of prior infarct as well as petechial hemorrhage within the site of left prior left MCA stroke, neurology was consulted, CTA head noted left MCA to be patent, EEG ordered  Acute encephalopathy, Unresponsive might have be related to HD.  Recent Acute ischemic stroke with hemorrhagic transformation  -Admitted from rehab following  an episode of unresponsiveness, CT head noted 1.9X 1.3 cm intraparenchymal hemorrhage with mild left paramedian shift, petechial hemorrhage in the left basal ganglia at the site of recent MCA infarct -Follow-up CT head with some progression of the hemorrhage which is likely contrast, per Neuro. Finding old per neurology  -Eliquis initially held. Subsequently Neurology recommend to resume eliquis. CT finding stable and old.  -EEG is unremarkable -Mental status is improved and stable-back to baseline global aphasia, appreciate neurology input -PT/OT/SLP following -Discharge back to CIR when bed available. -CT head stable, ok to resume Eliquis per neurology.  -insurance decline CIR>   Severe hypothyroidism -TSH was very high, started on IV Synthroid , transitioned to po. -Discharge on Synthroid 100 mcg daily.   Bradycardia -Likely secondary to intracranial bleed -Prior history of bradycardia failed PPM insertion last admission -Appreciate EP input, PPM not felt to be indicated at this time, recommended follow-up with outpatient cardiology -Resolved  Recent stroke, Recent Acute ischemic stroke with hemorrhagic transformation  CTA head noted left occipital infarct with a nonocclusive thrombus septic at the left MCA bifurcation, she underwent cerebral angiogram with mechanical thrombectomy and revascularization, follow-up MRI brain noted acute ischemic infarcts in left MCA/PCA territories and a small amount of petechial blood in the left basalganglia, she developed A. fib this admission seen by EP and was started on anticoagulation -Hospitalized from 2/17 through 2/24 and then sent to CIR before readmission -Will need prolonged rehabilitation  ESRD on hemodialysis -Nephrology following.   Type 2 diabetes mellitus -Continue with Lantus BID and Meals coverage.  -  Paroxysmal atrial fibrillation -Ok to resume Eliquis per neurology. CT head stable.     Discharge Diagnoses:  Active  Problems:   Cerebral embolism with cerebral infarction  Acquired hypothyroidism   Chronic kidney disease, stage IV (severe) (HCC)   Acute ischemic left MCA stroke ([place])   ESRD on dialysis ([place])   Bradycardia    Discharge Instructions  Discharge Instructions    Diet - low sodium heart healthy   Complete by: As directed    Diet - low sodium heart healthy   Complete by: As directed    Increase activity slowly   Complete by: As directed    Increase activity slowly   Complete by: As directed    No wound care   Complete by: As directed    No wound care   Complete by: As directed      Allergies as of 08/01/2020   No Known Allergies     Medication List    STOP taking these medications   celecoxib 100 MG capsule Commonly known as: CELEBREX   HumaLOG 100 UNIT/ML injection Generic drug: insulin lispro   insulin glargine 100 UNIT/ML Solostar Pen Commonly known as: LANTUS Replaced by: insulin glargine 100 UNIT/ML injection     TAKE these medications   amitriptyline 25 MG tablet Commonly known as: ELAVIL Take 25 mg by mouth at bedtime.   amLODipine 10 MG tablet Commonly known as: NORVASC Take 10 mg by mouth daily.   apixaban 5 MG Tabs tablet Commonly known as: ELIQUIS Take 1 tablet (5 mg total) by mouth 2 (two) times daily.   atorvastatin 80 MG tablet Commonly known as: LIPITOR Take 80 mg by mouth every evening.   cholecalciferol 25 MCG (1000 UNIT) tablet Commonly known as: VITAMIN D3 Take 1,000 Units by mouth daily.   cinacalcet 60 MG tablet Commonly known as: SENSIPAR Take 60 mg by mouth daily.   gabapentin 300 MG capsule Commonly known as: NEURONTIN Take 300 mg by mouth daily.   insulin aspart 100 UNIT/ML injection Commonly known as: novoLOG Inject 2 Units into the skin 3 (three) times daily with meals.   insulin glargine 100 UNIT/ML injection Commonly known as: LANTUS Inject 0.35 mLs (35 Units total) into the skin 2 (two) times daily. Replaces:  insulin glargine 100 UNIT/ML Solostar Pen   levothyroxine 100 MCG tablet Commonly known as: SYNTHROID Take 1 tablet (100 mcg total) by mouth daily at 6 (six) AM. What changed:   medication strength  how much to take  when to take this   losartan 50 MG tablet Commonly known as: COZAAR Take 50 mg by mouth daily.   polyethylene glycol 17 g packet Commonly known as: MIRALAX / GLYCOLAX Take 17 g by mouth daily.   senna-docusate 8.6-50 MG tablet Commonly known as: Senokot-S Take 1 tablet by mouth at bedtime as needed for mild constipation.   sevelamer carbonate 800 MG tablet Commonly known as: RENVELA Take 800 mg by mouth 3 (three) times daily.   torsemide 20 MG tablet Commonly known as: DEMADEX Take 20 mg by mouth daily.   vitamin B-12 1000 MCG tablet Commonly known as: CYANOCOBALAMIN Take 2,000 mcg by mouth daily.   vitamin E 180 MG (400 UNITS) capsule Take 400 Units by mouth daily.       Contact information for after-discharge care    Destination    [facility] Preferred SNF .   Service: Skilled Nursing Contact information: [address] [place] [phone]                 No Known Allergies  Consultations: Neurology Cardiology   Procedures/Studies: No results found.  Subjective: Alert, aphasic.   Discharge Exam: Vitals:   08/01/20 1444 08/01/20 1728  BP: 107/64 (!) 150/56  Pulse: 61 72  Resp: 17 15  Temp: 98.4 F (36.9 C) 97.9 F (36.6 C)  SpO2: 98%      General: Pt is alert, awake, not in acute distress Cardiovascular: RRR, S1/S2 +, no rubs, no gallops Respiratory: CTA bilaterally, no wheezing, no rhonchi Abdominal: Soft, NT, ND, bowel sounds + Extremities: no edema, no cyanosis    The results of significant diagnostics from this hospitalization (including imaging, microbiology, ancillary and laboratory) are listed below for reference.     Microbiology: No results found for this  or any previous visit (from the past 240 hour(s)).   Labs: BNP (last 3 results) No results for input(s): BNP in the last 8760 hours. Basic Metabolic Panel: No results for input(s): NA, K, CL, CO2, GLUCOSE, BUN, CREATININE, CALCIUM, MG, PHOS in the last 168 hours. Liver Function Tests: No results for input(s): AST, ALT, ALKPHOS, BILITOT, PROT, ALBUMIN in the last 168 hours. No results for input(s): LIPASE, AMYLASE in the last 168 hours. No results for input(s): AMMONIA in the last 168 hours. CBC: No results for input(s): WBC, NEUTROABS, HGB, HCT, MCV, PLT in the last 168 hours. Cardiac Enzymes: No results for input(s): CKTOTAL, CKMB, CKMBINDEX, TROPONINI in the last 168 hours. BNP: Invalid input(s): POCBNP CBG: No results for input(s): GLUCAP in the last 168 hours. D-Dimer No results for input(s): DDIMER in the last 72 hours. Hgb A1c No results for input(s): HGBA1C in the last 72 hours. Lipid Profile No results for input(s): CHOL, HDL, LDLCALC, TRIG, CHOLHDL, LDLDIRECT in the last 72 hours. Thyroid function studies No results for input(s): TSH, T4TOTAL, T3FREE, THYROIDAB in the last 72 hours.  Invalid input(s): FREET3 Anemia work up No results for input(s): VITAMINB12, FOLATE, FERRITIN, TIBC, IRON, RETICCTPCT in the last 72 hours. Urinalysis No results found for: COLORURINE, APPEARANCEUR, LABSPEC, [place], GLUCOSEU, HGBUR, BILIRUBINUR, KETONESUR, PROTEINUR, UROBILINOGEN, NITRITE, LEUKOCYTESUR Sepsis Labs Invalid input(s): PROCALCITONIN,  WBC,  LACTICIDVEN Microbiology No results found for this or any previous visit (from the past 240 hour(s)).   Time coordinating discharge: 40 minutes  SIGNED:   Elmarie Shiley, MD  [facility]

## 2020-07-29 NOTE — Progress Notes (Signed)
Inpatient Rehab Admissions Coordinator:   Insurance denied request for CIR. CM spoke with Pt. And daughter and they do not wish to appeal and would like to pursue short term rehab at [facility]. I will sign off.   Clemens Catholic, [place], [place] Admissions Coordinator  [phone] ([place]) [phone] (office)

## 2020-07-30 ENCOUNTER — Other Ambulatory Visit: Payer: Self-pay | Admitting: Infectious Diseases

## 2020-07-30 DIAGNOSIS — N186 End stage renal disease: Secondary | ICD-10-CM

## 2020-07-30 DIAGNOSIS — R75 Inconclusive laboratory evidence of human immunodeficiency virus [HIV]: Secondary | ICD-10-CM

## 2020-07-30 DIAGNOSIS — Z992 Dependence on renal dialysis: Secondary | ICD-10-CM

## 2020-07-30 DIAGNOSIS — I69320 Aphasia following cerebral infarction: Secondary | ICD-10-CM

## 2020-07-30 LAB — RENAL FUNCTION PANEL
Albumin: 3.3 g/dL — ABNORMAL LOW (ref 3.5–5.0)
Anion gap: 16 — ABNORMAL HIGH (ref 5–15)
BUN: 62 mg/dL — ABNORMAL HIGH (ref 8–23)
CO2: 23 mmol/L (ref 22–32)
Calcium: 9.3 mg/dL (ref 8.9–10.3)
Chloride: 95 mmol/L — ABNORMAL LOW (ref 98–111)
Creatinine, Ser: 12.08 mg/dL — ABNORMAL HIGH (ref 0.44–1.00)
GFR, Estimated: 3 mL/min — ABNORMAL LOW (ref >60)
Glucose, Bld: 54 mg/dL — ABNORMAL LOW (ref 70–99)
Phosphorus: 6.6 mg/dL — ABNORMAL HIGH (ref 2.5–4.6)
Potassium: 4.9 mmol/L (ref 3.5–5.1)
Sodium: 134 mmol/L — ABNORMAL LOW (ref 135–145)

## 2020-07-30 LAB — CRYPTOCOCCAL ANTIGEN: Crypto Ag: NEGATIVE

## 2020-07-30 LAB — CBC
HCT: 36 % (ref 36.0–46.0)
Hemoglobin: 11.5 g/dL — ABNORMAL LOW (ref 12.0–15.0)
MCH: 30.8 pg (ref 26.0–34.0)
MCHC: 31.9 g/dL (ref 30.0–36.0)
MCV: 96.5 fL (ref 80.0–100.0)
Platelets: 235 10*3/uL (ref 150–400)
RBC: 3.73 MIL/uL — ABNORMAL LOW (ref 3.87–5.11)
RDW: 14.6 % (ref 11.5–15.5)
WBC: 7.6 10*3/uL (ref 4.0–10.5)
nRBC: 0 % (ref 0.0–0.2)

## 2020-07-30 LAB — GLUCOSE, CAPILLARY
Glucose-Capillary: 71 mg/dL (ref 70–99)
Glucose-Capillary: 228 mg/dL — ABNORMAL HIGH (ref 70–99)
Glucose-Capillary: 249 mg/dL — ABNORMAL HIGH (ref 70–99)

## 2020-07-30 LAB — HIV-1/2 AB - DIFFERENTIATION
HIV 1 Ab: NONREACTIVE
HIV 2 Ab: NONREACTIVE
Note: NEGATIVE

## 2020-07-30 LAB — SARS CORONAVIRUS 2 (TAT 6-24 HRS): SARS Coronavirus 2: NEGATIVE

## 2020-07-30 MED ORDER — LIDOCAINE HCL (PF) 1 % IJ SOLN: 5.0000 mL | INTRAMUSCULAR | Status: DC | PRN

## 2020-07-30 MED ORDER — PENTAFLUOROPROP-TETRAFLUOROETH EX AERO: 1.0000 "application " | INHALATION_SPRAY | CUTANEOUS | Status: DC | PRN

## 2020-07-30 MED ORDER — ATROPINE SULFATE 1 MG/10ML IJ SOSY: 0.5000 mg | PREFILLED_SYRINGE | INTRAMUSCULAR | Status: DC | PRN

## 2020-07-30 MED ORDER — LIDOCAINE-PRILOCAINE 2.5-2.5 % EX CREA
1.0000 "application " | TOPICAL_CREAM | CUTANEOUS | Status: DC | PRN
  Filled 2020-07-30: qty 5

## 2020-07-30 NOTE — Progress Notes (Addendum)
Patient ID: Rachael Herrera, female   DOB: [birth year], 60 y.o.   MRN: [id number] S: Resting with no complaints O:BP (!) 153/80 (BP Location: Right Arm)   Pulse (!) 53   Temp 98.3 F (36.8 C) (Oral)   Resp 19   Ht '5\' 8"'$  (1.727 m)   Wt 111.3 kg   SpO2 95%   BMI 37.31 kg/m   Intake/Output Summary (Last 24 hours) at 07/30/2020 1049 Last data filed at 07/30/2020 1015 Gross per 24 hour  Intake --  Output 2000 ml  Net -2000 ml   Intake/Output: I/O last 3 completed shifts: In: 120 [P.O.:120] Out: -   Intake/Output this shift:  Total I/O In: -  Out: 2000 [Other:2000] Weight change: 3.175 kg Gen: NAD CVS: bradycardic Resp: Lateral chest rise, no increased work of breathing Abd: benign Ext: no edema, LUE AVF +T/B  Recent Labs  Lab 07/23/20 1300 07/23/20 1723 07/23/20 2012 07/24/20 0303 07/25/20 0726 07/26/20 0336 07/27/20 1028 07/30/20 0722  NA 133* 135  --  137 129* 132* 134* 134*  K 4.4 4.2  --  4.1 5.0 4.7 4.5 4.9  CL 93* 93*  --  96* 93* 93* 93* 95*  CO2 20* 26  --  '27 22 22 24 23  '$ GLUCOSE 268* 136*  --  89 286* 249* 209* 54*  BUN 61* 31*  --  32* 49* 35* 51* 62*  CREATININE 12.72* 8.25*  --  9.11* 11.54* 8.35* 10.49* 12.08*  ALBUMIN 3.6  --   --   --  3.4*  --  3.4* 3.3*  CALCIUM 8.9 9.0  --  9.1 8.8* 9.0 9.0 9.3  PHOS 6.7*  --  4.6  --  6.8*  --  5.4* 6.6*   Liver Function Tests: Recent Labs  Lab 07/25/20 0726 07/27/20 1028 07/30/20 0722  ALBUMIN 3.4* 3.4* 3.3*   No results for input(s): LIPASE, AMYLASE in the last 168 hours. Recent Labs  Lab 07/23/20 1612  AMMONIA <9*   CBC: Recent Labs  Lab 07/24/20 0303 07/25/20 0726 07/26/20 0336 07/27/20 1028 07/30/20 0722  WBC 6.8 6.7 6.2 7.5 7.6  HGB 12.3 11.5* 12.2 11.8* 11.5*  HCT 37.8 36.9 39.0 37.1 36.0  MCV 95.5 96.9 96.8 95.6 96.5  PLT 272 270 227 243 235   Cardiac Enzymes: No results for input(s): CKTOTAL, CKMB, CKMBINDEX, TROPONINI in the last 168 hours. CBG: Recent Labs  Lab 07/28/20 2144  07/29/20 0736 07/29/20 1155 07/29/20 1804 07/29/20 2157  GLUCAP 248* 280* 309* 304* 171*    Iron Studies: No results for input(s): IRON, TIBC, TRANSFERRIN, FERRITIN in the last 72 hours. Studies/Results: CT HEAD WO CONTRAST  Result Date: 07/29/2020 CLINICAL DATA:  Stroke with hemorrhage, follow-up EXAM: CT HEAD WITHOUT CONTRAST TECHNIQUE: Contiguous axial images were obtained from the base of the skull through the vertex without intravenous contrast. COMPARISON:  07/25/2020 FINDINGS: Brain: Evolving areas of infarction are again identified in the left MCA and PCA territories with hemorrhage. There is no new hemorrhage or loss of gray-white differentiation. Mass effect on the left lateral ventricle is slightly decreased. No midline shift or hydrocephalus. Vascular: No new findings. Skull: Unremarkable. Sinuses/Orbits: Persistent right posterior ethmoid opacification. No new orbital finding. Other: None. IMPRESSION: Expected evolution of areas of left MCA and PCA infarction with hemorrhage. Decreased mild mass effect. No new findings. Electronically Signed   By: Macy Mis M.D.   On: 07/29/2020 08:10   .  stroke: mapping our early stages of recovery  book   Does not apply Once  . amitriptyline  25 mg Oral QHS  . amLODipine  10 mg Oral Daily  . apixaban  5 mg Oral BID  . atorvastatin  80 mg Oral QPM  . cholecalciferol  1,000 Units Oral Daily  . cinacalcet  60 mg Oral Q breakfast  . gabapentin  300 mg Oral Daily  . insulin aspart  0-6 Units Subcutaneous TID WC  . insulin aspart  2 Units Subcutaneous TID WC  . insulin glargine  35 Units Subcutaneous BID  . levothyroxine  100 mcg Oral Q0600  . losartan  50 mg Oral Daily  . polyethylene glycol  17 g Oral Daily  . sevelamer carbonate  800 mg Oral TID WC  . torsemide  20 mg Oral Daily  . vitamin B-12  2,000 mcg Oral Daily  . vitamin E  400 Units Oral Daily    BMET    Component Value Date/Time   NA 134 (L) 07/30/2020 0722   K 4.9  07/30/2020 0722   CL 95 (L) 07/30/2020 0722   CO2 23 07/30/2020 0722   GLUCOSE 54 (L) 07/30/2020 0722   BUN 62 (H) 07/30/2020 0722   CREATININE 12.08 (H) 07/30/2020 0722   CALCIUM 9.3 07/30/2020 0722   GFRNONAA 3 (L) 07/30/2020 0722   CBC    Component Value Date/Time   WBC 7.6 07/30/2020 0722   RBC 3.73 (L) 07/30/2020 0722   HGB 11.5 (L) 07/30/2020 0722   HCT 36.0 07/30/2020 0722   PLT 235 07/30/2020 0722   MCV 96.5 07/30/2020 0722   MCH 30.8 07/30/2020 0722   MCHC 31.9 07/30/2020 0722   RDW 14.6 07/30/2020 0722   LYMPHSABS 1.4 07/19/2020 0505   MONOABS 1.1 (H) 07/19/2020 0505   EOSABS 0.2 07/19/2020 0505   BASOSABS 0.1 07/19/2020 0505    Assessment/Plan:  1. AMS- happened acutely with HD 07/23/20 without drop in BP. CT of head with acute parenchymal hemorrhage within the paramedian left occipital lobe at site of known subacute left PCA territory infarct. Neuro following and recommended repeatCT scanwhich showed no change in hemorrhagic conversion of LMCA and PCA territory infarctions. MS improved.  Continue to monitor.  Tolerated dialysis today well 2. ESRD - cont with HD on TTS schedule 3. HTN/volume - stable 4. 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 CVA - s/p thrombectomy and was in CIR. 7. Bradycardia - has been persistent and not on beta-blocker. Cardiology has consulted 8. Disposition- likely transfer back to CIR

## 2020-07-30 NOTE — Progress Notes (Signed)
PT Cancellation Note  Patient Details Name: LIZZIE GITCHELL MRN: [id number] DOB: 12/13/1960   Cancelled Treatment:    Reason Eval/Treat Not Completed: Patient at procedure or test/unavailable (HD). Will follow-up for PT treatment as schedule permits.  Mabeline Caras, PT, DPT Acute Rehabilitation Services  Pager [phone] Office [phone]  Derry Lory 07/30/2020, 7:41 AM

## 2020-07-30 NOTE — Progress Notes (Signed)
Patient seen on dialysis, no changes from discharge summary as noted by Dr. Tyrell Antonio yesterday. -Plan for discharge to SNF when bed available  Domenic Polite, MD

## 2020-07-30 NOTE — Consult Note (Signed)
Date of Admission:  07/23/2020          Reason for Consult: HIV+ test    Referring Provider: Dr. Broadus John   Assessment:  1. Likely false + HIV test (unless this is acute HIV) 2. ICH with expressive aphasia >> receptive aphasia 3. ESRD on HD  Plan:  1. Followup on HIV RNA quant but HIV does not seem likely 2. Would love it if the FDA would approve HIV quant RNA's for diagnosis of HIV so we could move away from the drama of false + 4th generation tests and the delays in finalizing diagnosis of a serious infection  Active Problems:   Bradycardia   Intracerebral hemorrhage   Scheduled Meds: .  stroke: mapping our early stages of recovery book   Does not apply Once  . amitriptyline  25 mg Oral QHS  . amLODipine  10 mg Oral Daily  . apixaban  5 mg Oral BID  . atorvastatin  80 mg Oral QPM  . cholecalciferol  1,000 Units Oral Daily  . cinacalcet  60 mg Oral Q breakfast  . gabapentin  300 mg Oral Daily  . insulin aspart  0-6 Units Subcutaneous TID WC  . insulin aspart  2 Units Subcutaneous TID WC  . insulin glargine  35 Units Subcutaneous BID  . levothyroxine  100 mcg Oral Q0600  . losartan  50 mg Oral Daily  . polyethylene glycol  17 g Oral Daily  . sevelamer carbonate  800 mg Oral TID WC  . torsemide  20 mg Oral Daily  . vitamin B-12  2,000 mcg Oral Daily  . vitamin E  400 Units Oral Daily   Continuous Infusions: PRN Meds:.acetaminophen **OR** acetaminophen (TYLENOL) oral liquid 160 mg/5 mL **OR** acetaminophen, hydrALAZINE, senna-docusate  HPI: Rachael Herrera is a 60 y.o. female with multiple medical problems including DM, hypoglycmic seizures,  Tach-brady cyndorme, admitted on 2/17 with reigh sided wekaness, ataxia and lef gaze preference. She was found to have occipital infract and nonocclusive thromsu left MCA and had mcehancial thrombectomy. She also developed atrial fibrillaion and started on anticoagulation. HIV 4th gen prelim test that admission + but discrim assay  not done per [facility] because specimen lipemic. She is admitted yet agin with severe nonfluent expressive aphasia. CT head noted left occipital hemorrhage at the site of prior infarct as well as petechial hemorrhage within the site of left prior left MCA stroke, neurology was consulted, CTA head noted left MCA to be patent  Repeat HIV test done and 4th gen initial step +. Discrim HIV1/2 antibody not visible in Epic. However I called [facility] and they are negative.   There remains the possibility that the patient has acute HIV but this seems unlikely  I will not start ARV but will followup on HIV quant RNA--which I wish was the standard of care diagnosis of HIV 1 rather than the gymnastics and anxiety that 4th gen assays produce.  I ordered RPR and crypto ag in case any of her strokes were related to these ID causes of CVA.    Review of Systems: Review of Systems  Unable to perform ROS: Mental status change    Past Medical History:  Diagnosis Date  . Cavitary pneumonia 06/2017   due to MSSA  . Claudication of both lower extremities ([place])   . DDD (degenerative disc disease), cervical   . DDD (degenerative disc disease), lumbosacral   . Diabetes mellitus ([place])   . Diabetic neuropathy ([place])   .  ESRD (end stage renal disease) on dialysis ([place])   . GERD (gastroesophageal reflux disease)   . NSVT (nonsustained ventricular tachycardia) ([place]) 10/20201  . OSA on CPAP   . Stroke ([place])   . Ulnar neuropathy    BUE  . Vitamin B 12 deficiency     Social History   Tobacco Use  . Smoking status: Never Smoker  . Smokeless tobacco: Never Used  Vaping Use  . Vaping Use: Never used  Substance Use Topics  . Alcohol use: Not Currently  . Drug use: Not Currently    Family History  Problem Relation Age of Onset  . Diabetes Mother   . Heart disease Mother   . Diabetes Father   . Heart disease Father   . Diabetes Sister    No Known Allergies  OBJECTIVE: Blood pressure (!) 153/80,  pulse (!) 53, temperature 98.3 F (36.8 C), temperature source Oral, resp. rate 19, height '5\' 8"'$  (1.727 m), weight 111.3 kg, SpO2 95 %.  Physical Exam Constitutional:      General: She is not in acute distress.    Appearance: Normal appearance. She is well-developed and well-nourished. She is not ill-appearing or diaphoretic.  HENT:     Head: Normocephalic and atraumatic.     Right Ear: Hearing and external ear normal.     Left Ear: Hearing and external ear normal.     Nose: No nasal deformity, rhinorrhea or epistaxis.  Eyes:     General: No scleral icterus.    Extraocular Movements: EOM normal.     Conjunctiva/sclera: Conjunctivae normal.     Right eye: Right conjunctiva is not injected.     Left eye: Left conjunctiva is not injected.     Pupils: Pupils are equal, round, and reactive to light.  Neck:     Vascular: No JVD.  Cardiovascular:     Rate and Rhythm: Normal rate and regular rhythm.     Heart sounds: S1 normal and S2 normal.  Pulmonary:     Effort: No respiratory distress.  Abdominal:     General: There is no distension or ascites.     Palpations: Abdomen is soft. There is no hepatosplenomegaly.     Tenderness: There is no abdominal tenderness.  Musculoskeletal:        General: Normal range of motion.     Right shoulder: Normal.     Left shoulder: Normal.     Cervical back: Normal range of motion and neck supple.     Right hip: Normal.     Left hip: Normal.     Right knee: Normal.     Left knee: Normal.  Lymphadenopathy:     Head:     Right side of head: No submandibular, preauricular or posterior auricular adenopathy.     Left side of head: No submandibular, preauricular or posterior auricular adenopathy.     Cervical: No cervical adenopathy.     Right cervical: No superficial or deep cervical adenopathy.    Left cervical: No superficial or deep cervical adenopathy.  Skin:    General: Skin is warm, dry and intact.     Coloration: Skin is not pale.      Findings: No abrasion, bruising, ecchymosis, erythema, lesion or rash.     Nails: There is no clubbing or cyanosis.  Neurological:     Mental Status: She is alert.     Sensory: No sensory deficit.     Coordination: Coordination normal.     Gait: Gait  normal.     Deep Tendon Reflexes: Strength normal.     Comments: Expressive aphasia >> receptive aphasia  Psychiatric:        Attention and Perception: She is attentive.        Mood and Affect: Mood and affect normal.        Speech: Speech normal.        Behavior: Behavior is cooperative.        Cognition and Memory: Cognition and memory normal.     Lab Results Lab Results  Component Value Date   WBC 7.6 07/30/2020   HGB 11.5 (L) 07/30/2020   HCT 36.0 07/30/2020   MCV 96.5 07/30/2020   PLT 235 07/30/2020    Lab Results  Component Value Date   CREATININE 12.08 (H) 07/30/2020   BUN 62 (H) 07/30/2020   NA 134 (L) 07/30/2020   K 4.9 07/30/2020   CL 95 (L) 07/30/2020   CO2 23 07/30/2020    Lab Results  Component Value Date   ALT 16 07/19/2020   AST 17 07/19/2020   ALKPHOS 76 07/19/2020   BILITOT 0.8 07/19/2020     Microbiology: No results found for this or any previous visit (from the past 240 hour(s)).  Alcide Evener, [place] [facility] [place] [facility] [phone] pager  07/30/2020, 3:46 PM

## 2020-07-30 NOTE — Progress Notes (Signed)
Physical Therapy Treatment Patient Details Name: Rachael Herrera MRN: [id number] DOB: 22-Jan-1961 Today's Date: 07/30/2020    History of Present Illness Pt is a 60 y.o. female readmitted from [facility] on 07/23/20 with AMS during HD. Pt admitted to CIR for recent L PCA and MCA infarcts. Pt initially thought to have new hemorrhagic stroke; neurology MD reports, "head CT (3/2) showed hemorrhagic conversion, but looks older, may not explain the acute mental status change." EEG without seizure. Repeat head CT 3/7 with expected evolution of infarcts, decreased mild mass effect; no new findings. PMH includes HTN, CKD IV, DM2, OSA, obesity, thrombectomy 06/2020.   PT Comments    Pt progressing with mobility. Pt remains limited by decreased activity tolerance, impaired balance strategies/postural reaction, R-side inattention, and cognitive impairment, including difficulty problem solving, decreased safety awareness and poor attention, as well as aphasia. Noted insurance declined CIR, therefore recommend SNF-level therapies to maximize functional mobility and independence prior to return home. Will continue to follow acutely.    Follow Up Recommendations  SNF;Supervision for mobility/OOB (CIR declined)     Equipment Recommendations  None recommended by PT    Recommendations for Other Services       Precautions / Restrictions Precautions Precautions: Fall Precaution Comments: R-side inattention, aphasia Restrictions Weight Bearing Restrictions: No    Mobility  Bed Mobility Overal bed mobility: Modified Independent Bed Mobility: Supine to Sit                Transfers Overall transfer level: Needs assistance Equipment used: Rolling walker (2 wheeled) Transfers: Sit to/from Stand Sit to Stand: Min guard         General transfer comment: Reliant on momentum to power into standing; pt declining attempt without RW  Ambulation/Gait Ambulation/Gait assistance: Min guard;Min assist Gait  Distance (Feet): 180 Feet Assistive device: Rolling walker (2 wheeled) Gait Pattern/deviations: Step-through pattern;Decreased stride length;Wide base of support;Trunk flexed     General Gait Details: Fast ambulation speed with RW and close min guard for balance; pt quick to become SOB with apparent fatigue, increasing gait speed with this and starting to run into objects with RW with difficulty turning requiring assist for RW navigation and stability; cues for deep breathing and slowed movements as pt appears anxious regarding fatigue/SOB. Cues to maintain closer proximity to RW. Increased time for turning with difficulty managing RW   Stairs             Wheelchair Mobility    Modified Rankin (Stroke Patients Only) Modified Rankin (Stroke Patients Only) Pre-Morbid Rankin Score: No significant disability Modified Rankin: Moderately severe disability     Balance Overall balance assessment: Needs assistance   Sitting balance-Leahy Scale: Fair       Standing balance-Leahy Scale: Poor                              Cognition Arousal/Alertness: Awake/alert Behavior During Therapy: Flat affect Overall Cognitive Status: Difficult to assess Area of Impairment: Problem solving;Awareness;Safety/judgement;Following commands;Attention                   Current Attention Level: Selective   Following Commands: Follows one step commands with increased time Safety/Judgement: Decreased awareness of safety;Decreased awareness of deficits Awareness: Intellectual;Emergent Problem Solving: Slow processing;Requires verbal cues;Requires tactile cues General Comments: Difficulty with word finding; apparent expressive > receptive aphasia. Able to answer yes/no appropriately and purposefully state some words; following majority of simple commands, sometimes requiring verbal  cues      Exercises      General Comments General comments (skin integrity, edema, etc.): Pt's  uncle present and supportive. SpO2 >/90% on RA      Pertinent Vitals/Pain Pain Assessment: Faces Faces Pain Scale: No hurt Pain Intervention(s): Monitored during session    Home Living                      Prior Function            PT Goals (current goals can now be found in the care plan section) Acute Rehab PT Goals Patient Stated Goal: Post-acute rehab at SNF since insurance denied CIR PT Goal Formulation: With patient/family Progress towards PT goals: Progressing toward goals    Frequency    Min 3X/week      PT Plan Discharge plan needs to be updated;Frequency needs to be updated    Co-evaluation              AM-PAC PT "6 Clicks" Mobility   Outcome Measure  Help needed turning from your back to your side while in a flat bed without using bedrails?: None Help needed moving from lying on your back to sitting on the side of a flat bed without using bedrails?: A Little Help needed moving to and from a bed to a chair (including a wheelchair)?: A Little Help needed standing up from a chair using your arms (e.g., wheelchair or bedside chair)?: A Little Help needed to walk in hospital room?: A Little Help needed climbing 3-5 steps with a railing? : A Lot 6 Click Score: 18    End of Session Equipment Utilized During Treatment: Gait belt Activity Tolerance: Patient tolerated treatment well Patient left: in chair;with call bell/phone within reach;with chair alarm set;with family/visitor present Nurse Communication: Mobility status PT Visit Diagnosis: Muscle weakness (generalized) (M62.81);Other abnormalities of gait and mobility (R26.89);Unsteadiness on feet (R26.81);Difficulty in walking, not elsewhere classified (R26.2);Hemiplegia and hemiparesis Hemiplegia - dominant/non-dominant: Dominant Hemiplegia - caused by: Nontraumatic intracerebral hemorrhage     Time: [id number] PT Time Calculation (min) (ACUTE ONLY): 21 min  Charges:  $Gait Training: 8-22  mins                     Mabeline Caras, PT, DPT Acute Rehabilitation Services  Pager [phone] Office [place] 07/30/2020, 5:40 PM

## 2020-07-30 NOTE — TOC Progression Note (Signed)
Transition of Care [facility]) - Progression Note    Patient Details  Name: LAQUISTA SHORB MRN: [id number] Date of Birth: 1960-11-04  Transition of Care [facility]) CM/SW [place], [place] Phone Number: 07/30/2020, 2:48 PM  Clinical Narrative:     CSW spoke with patients daughter Tamela Oddi and let her know that there are no current SNF bed offers. Patients daughter gave CSW permission to fax out [place]. CSW started insurance authorization for patient. Reference number [id number].  Pending SNF bed offers. Pending insurance authorization.  CSW will continue to follow.    Expected Discharge Plan: [place] Barriers to Discharge: Continued Medical Work up  Expected Discharge Plan and Services Expected Discharge Plan: [place] In-house Referral: Clinical Social Work Discharge Planning Services: CM Consult Post Acute Care Choice: [place] Living arrangements for the past 2 months:  (Patient was previously at [facility] inpatient rehab) Expected Discharge Date: 07/29/20               DME Arranged: N/A DME Agency: NA       HH Arranged: NA HH Agency: NA         Social Determinants of Health (SDOH) Interventions    Readmission Risk Interventions No flowsheet data found.

## 2020-07-30 NOTE — Progress Notes (Signed)
      INFECTIOUS DISEASE ATTENDING ADDENDUM:   Date: 07/30/2020  Patient name: Rachael Herrera  Medical record number: [id number]  Date of birth: 1960-09-02   [name] received fax of report I asked for and not only are discriminitory assays negative. HIV 1 and 2 RNA qual are negative  Therefore HIV is ruled out  We will sign off   Please call with further questions.      [name] 07/30/2020, 5:10 PM

## 2020-07-31 DIAGNOSIS — E669 Obesity, unspecified: Secondary | ICD-10-CM

## 2020-07-31 DIAGNOSIS — I619 Nontraumatic intracerebral hemorrhage, unspecified: Secondary | ICD-10-CM

## 2020-07-31 LAB — HIV-1 RNA QUANT-NO REFLEX-BLD
HIV 1 RNA Quant: <20 copies/mL
LOG10 HIV-1 RNA: UNDETERMINED log10copy/mL

## 2020-07-31 LAB — GLUCOSE, CAPILLARY
Glucose-Capillary: 176 mg/dL — ABNORMAL HIGH (ref 70–99)
Glucose-Capillary: 153 mg/dL — ABNORMAL HIGH (ref 70–99)
Glucose-Capillary: 214 mg/dL — ABNORMAL HIGH (ref 70–99)
Glucose-Capillary: 201 mg/dL — ABNORMAL HIGH (ref 70–99)
Glucose-Capillary: 179 mg/dL — ABNORMAL HIGH (ref 70–99)

## 2020-07-31 LAB — RPR: RPR Ser Ql: NONREACTIVE

## 2020-07-31 NOTE — TOC Progression Note (Addendum)
Transition of Care [facility]) - Progression Note    Patient Details  Name: Rachael Herrera MRN: [id number] Date of Birth: [birth year]  Transition of Care [facility]) CM/SW [place], [place] Phone Number: 07/31/2020, 11:05 AM  Clinical Narrative:     Update 3/9 CSW received call from Patients insurance. Insurance authorization has gone to [employer] for review. CSW informed Juliann Pulse with [facility]. CSW informed MD and Terri Piedra Renal Navigator.  Insurance authorization is still pending.  CSW coordinated with renal navigator Terri Piedra regarding patients transportation from SNF to HD. Garth Bigness renal navigator has set up transportation for patient through 3/17. CSW gave patients daughter information on how to set up transportation for patient if patient is still at SNF past 3/17. CSW informed Juliann Pulse at [facility].  Patient has SNF bed at [facility]. Insurance authorization is pending.  CSW will continue to follow.    Expected Discharge Plan: [place] Barriers to Discharge: Continued Medical Work up  Expected Discharge Plan and Services Expected Discharge Plan: [place] In-house Referral: Clinical Social Work Discharge Planning Services: CM Consult Post Acute Care Choice: [place] Living arrangements for the past 2 months:  (Patient was previously at [facility] inpatient rehab) Expected Discharge Date: 07/29/20               DME Arranged: N/A DME Agency: NA       HH Arranged: NA HH Agency: NA         Social Determinants of Health (SDOH) Interventions    Readmission Risk Interventions No flowsheet data found.

## 2020-07-31 NOTE — Progress Notes (Signed)
  Speech Language Pathology Treatment: Dysphagia;Cognitive-Linquistic  Patient Details Name: Rachael Herrera MRN: [id number] DOB: [birth year] Today's Date: 07/31/2020 Time: [id number] SLP Time Calculation (min) (ACUTE ONLY): 14 min  Assessment / Plan / Recommendation Clinical Impression  No overt s/s of aspiration were observed with limited PO trials. Pt benefited from [place] verbal cues to take liquid washes after solids. Recommend continuation of her current diet and for staff to check for oral clearance. Pt was also seen for cognitive-communicative treatment. She continues to demonstrate jargon during verbal expression with some automatic words such as "yeah" and "have a good day" noted. She also demonstrated good problem solving skills when attempting to unlock her phone and while playing a matching game on her phone. SLP gave her a phonemic cue when asked to label a pen, but still had difficulty saying "pen" with accuracy. SLP will continue to follow.     HPI HPI: 60 yo female presenting with new L occip and basal ganglia hemorrhages, after recent L MCA and PCA infarcts.  Was in CIR when bradycardia and AMS occurred, and MD then found her expanded cerebral changes but no seizure activity on EEG. Pt was being followed for aphasaia, apraxia, and dysphagia while on CIR (most recently on Dys 2 diet and thin liquids). PMHx:   thrombectomy 2/17. DM II, OSA, CKD IV, HTN, HLD, and morbid obesity.      SLP Plan  Continue with current plan of care       Recommendations  Diet recommendations: Dysphagia 3 (mechanical soft);Thin liquid Liquids provided via: Cup;Straw Medication Administration: Whole meds with puree Supervision: Staff to assist with self feeding;Full supervision/cueing for compensatory strategies Compensations: Minimize environmental distractions;Slow rate;Small sips/bites;Lingual sweep for clearance of pocketing Postural Changes and/or Swallow Maneuvers: Seated upright 90 degrees                 Oral Care Recommendations: Oral care BID Follow up Recommendations: Skilled Nursing facility SLP Visit Diagnosis: Dysphagia, unspecified (R13.10);Aphasia (R47.01) Plan: Continue with current plan of care       GO                Jeanine Luz., SLP Student 07/31/2020, 4:17 PM

## 2020-07-31 NOTE — Progress Notes (Signed)
Subjective:  She is entering items into her phone with stylus  Antibiotics:  Anti-infectives (From admission, onward)   None      Medications: Scheduled Meds: .  stroke: mapping our early stages of recovery book   Does not apply Once  . amitriptyline  25 mg Oral QHS  . amLODipine  10 mg Oral Daily  . apixaban  5 mg Oral BID  . atorvastatin  80 mg Oral QPM  . cholecalciferol  1,000 Units Oral Daily  . cinacalcet  60 mg Oral Q breakfast  . gabapentin  300 mg Oral Daily  . insulin aspart  0-6 Units Subcutaneous TID WC  . insulin aspart  2 Units Subcutaneous TID WC  . insulin glargine  35 Units Subcutaneous BID  . levothyroxine  100 mcg Oral Q0600  . losartan  50 mg Oral Daily  . polyethylene glycol  17 g Oral Daily  . sevelamer carbonate  800 mg Oral TID WC  . torsemide  20 mg Oral Daily  . vitamin B-12  2,000 mcg Oral Daily  . vitamin E  400 Units Oral Daily   Continuous Infusions: PRN Meds:.acetaminophen **OR** acetaminophen (TYLENOL) oral liquid 160 mg/5 mL **OR** acetaminophen, atropine, hydrALAZINE, senna-docusate    Objective: Weight change: -2.099 kg  Intake/Output Summary (Last 24 hours) at 07/31/2020 1026 Last data filed at 07/31/2020 0600 Gross per 24 hour  Intake 220 ml  Output --  Net 220 ml   Blood pressure (!) 138/53, pulse (!) 50, temperature 97.9 F (36.6 C), temperature source Oral, resp. rate 14, height '5\' 8"'$  (1.727 m), weight 111.3 kg, SpO2 95 %. Temp:  [97.9 F (36.6 C)-98.6 F (37 C)] 97.9 F (36.6 C) (03/09 0741) Pulse Rate:  [49-53] 50 (03/09 0741) Resp:  [11-19] 14 (03/09 0741) BP: (130-153)/(53-80) 138/53 (03/09 0741) SpO2:  [94 %-100 %] 95 % (03/09 0741)  Physical Exam: Physical Exam Constitutional:      Appearance: She is obese.  Pulmonary:     Effort: Pulmonary effort is normal. No respiratory distress.  Abdominal:     Palpations: Abdomen is soft.  Musculoskeletal:        General: Normal range of motion.  Skin:     General: Skin is warm.     Coloration: Skin is not jaundiced.     Findings: No bruising or erythema.  Neurological:     Mental Status: She is alert.  Psychiatric:        Mood and Affect: Mood normal.      CBC:    BMET Recent Labs    07/30/20 0722  NA 134*  K 4.9  CL 95*  CO2 23  GLUCOSE 54*  BUN 62*  CREATININE 12.08*  CALCIUM 9.3     Liver Panel  Recent Labs    07/30/20 0722  ALBUMIN 3.3*       Sedimentation Rate No results for input(s): ESRSEDRATE in the last 72 hours. C-Reactive Protein No results for input(s): CRP in the last 72 hours.  Micro Results: Recent Results (from the past 720 hour(s))  Resp Panel by RT-PCR (Flu A&B, Covid) Nasopharyngeal Swab     Status: None   Collection Time: 07/11/20  2:27 PM   Specimen: Nasopharyngeal Swab; Nasopharyngeal(NP) swabs in vial transport medium  Result Value Ref Range Status   SARS Coronavirus 2 by RT PCR NEGATIVE NEGATIVE Final    Comment: (NOTE) SARS-CoV-2 target nucleic acids are NOT DETECTED.  The SARS-CoV-2 RNA  is generally detectable in upper respiratory specimens during the acute phase of infection. The lowest concentration of SARS-CoV-2 viral copies this assay can detect is 138 copies/mL. A negative result does not preclude SARS-Cov-2 infection and should not be used as the sole basis for treatment or other patient management decisions. A negative result may occur with  improper specimen collection/handling, submission of specimen other than nasopharyngeal swab, presence of viral mutation(s) within the areas targeted by this assay, and inadequate number of viral copies(<138 copies/mL). A negative result must be combined with clinical observations, patient history, and epidemiological information. The expected result is Negative.  Fact Sheet for Patients:  EntrepreneurPulse.com.au  Fact Sheet for Healthcare Providers:  IncredibleEmployment.be  This test is  no t yet approved or cleared by the [place] FDA and  has been authorized for detection and/or diagnosis of SARS-CoV-2 by FDA under an Emergency Use Authorization (EUA). This EUA will remain  in effect (meaning this test can be used) for the duration of the COVID-19 declaration under Section 564(b)(1) of the Act, 21 U.S.C.section 360bbb-3(b)(1), unless the authorization is terminated  or revoked sooner.       Influenza A by PCR NEGATIVE NEGATIVE Final   Influenza B by PCR NEGATIVE NEGATIVE Final    Comment: (NOTE) The Xpert Xpress SARS-CoV-2/FLU/RSV plus assay is intended as an aid in the diagnosis of influenza from Nasopharyngeal swab specimens and should not be used as a sole basis for treatment. Nasal washings and aspirates are unacceptable for Xpert Xpress SARS-CoV-2/FLU/RSV testing.  Fact Sheet for Patients: EntrepreneurPulse.com.au  Fact Sheet for Healthcare Providers: IncredibleEmployment.be  This test is not yet approved or cleared by the [place] FDA and has been authorized for detection and/or diagnosis of SARS-CoV-2 by FDA under an Emergency Use Authorization (EUA). This EUA will remain in effect (meaning this test can be used) for the duration of the COVID-19 declaration under Section 564(b)(1) of the Act, 21 U.S.C. section 360bbb-3(b)(1), unless the authorization is terminated or revoked.  Performed at [facility], [place] [address] [place], [place] [address]   MRSA PCR Screening     Status: None   Collection Time: 07/11/20  4:31 PM   Specimen: Nasal Mucosa; Nasopharyngeal  Result Value Ref Range Status   MRSA by PCR NEGATIVE NEGATIVE Final    Comment:        The GeneXpert MRSA Assay (FDA approved for NASAL specimens only), is one component of a comprehensive MRSA colonization surveillance program. It is not intended to diagnose MRSA infection nor to guide or monitor treatment for MRSA  infections. Performed at [facility], [place] [address] [place], [place] [address]   SARS CORONAVIRUS 2 (TAT 6-24 HRS) Nasopharyngeal Nasopharyngeal Swab     Status: None   Collection Time: 07/30/20  5:27 PM   Specimen: Nasopharyngeal Swab  Result Value Ref Range Status   SARS Coronavirus 2 NEGATIVE NEGATIVE Final    Comment: (NOTE) SARS-CoV-2 target nucleic acids are NOT DETECTED.  The SARS-CoV-2 RNA is generally detectable in upper and lower respiratory specimens during the acute phase of infection. Negative results do not preclude SARS-CoV-2 infection, do not rule out co-infections with other pathogens, and should not be used as the sole basis for treatment or other patient management decisions. Negative results must be combined with clinical observations, patient history, and epidemiological information. The expected result is Negative.  Fact Sheet for Patients: SugarRoll.be  Fact Sheet for Healthcare Providers: https://www.woods-mathews.com/  This test is not  yet approved or cleared by the [place] and  has been authorized for detection and/or diagnosis of SARS-CoV-2 by FDA under an Emergency Use Authorization (EUA). This EUA will remain  in effect (meaning this test can be used) for the duration of the COVID-19 declaration under Se ction 564(b)(1) of the Act, 21 U.S.C. section 360bbb-3(b)(1), unless the authorization is terminated or revoked sooner.  Performed at [facility], [place] [address], [place], [place] [address]     Studies/Results: No results found.    Assessment/Plan:  INTERVAL HISTORY: As mentioned in my addendum yesterday her HIV test is negative her Shelda Pal is a false positive.   Active Problems:   Bradycardia   Intracerebral hemorrhage    Rachael Herrera is a 60 y.o. female with admission for intracranial hemorrhage with expressive aphasia and receptive aphasia with background history of  end-stage renal disease on hemodialysis.  She had an illicit initial HIV test that was positive but it turns out to have been a false positive but discriminatory assays being negative.    Formed her of her negative result today and confirmed to her that she does not have HIV.  I will sign off please call with further questions.   LOS: 8 days   Alcide Evener 07/31/2020, 10:26 AM

## 2020-07-31 NOTE — Progress Notes (Addendum)
Renal Navigator received call from [facility] CSW/A. Rachell Cipro that patient can discharge to [facility] today if we can get transportation arranged. Patient dialyzes TTS 5:50am at [facility] [address] Access [place], therefore, is not available to patient. Per CSW, patient's daughter states patient has transportation through insurance. Navigator notes that patient has [facility] Medicare and Medicaid. Navigator called [place] at [phone] and has arranged transportation starting tomorrow, 08/01/20 from [facility] to [place] with a pick up window between 4:15am-4:45am to HD and 9:45am back to SNF. Navigator has based this on a 3.5 hour treatment time from Nephrology consult note outpatient orders from 07/11/20. Navigator has arranged the same transportation for 3/12, 3/15, and 3/17. Patient/family will be responsible for scheduling transportation past 3/17 and [facility] requests that it is made 3 days in advance. (Transportation reservation numbers in the event they need to be changed or there is an issue are:  3/10: [id number], 3/12: [id number], 3/15: [id number], 3/17: [id number]) Navigator contacted [place] to inform of above and will fax discharge information when available.   Alphonzo Cruise, [place] Renal Navigator [phone]

## 2020-07-31 NOTE — Progress Notes (Signed)
RT note. Pt. Refusing CPAP tonight. Pt. Stated she does not wear a CPAP at home also. VSS, pt. Resting comfortable. RT will continue to monitor pt.

## 2020-07-31 NOTE — Progress Notes (Signed)
Patient ID: Rachael Herrera, female   DOB: 08-18-1960, 60 y.o.   MRN: [id number] S: Sitting in bed, no distress tolerated dialysis well yesterday O:BP (!) 137/46   Pulse (!) 50   Temp 97.9 F (36.6 C) (Oral)   Resp 15   Ht '5\' 8"'$  (1.727 m)   Wt 111.3 kg   SpO2 98%   BMI 37.31 kg/m   Intake/Output Summary (Last 24 hours) at 07/31/2020 1301 Last data filed at 07/31/2020 0600 Gross per 24 hour  Intake 220 ml  Output -  Net 220 ml   Intake/Output: I/O last 3 completed shifts: In: 220 [P.O.:220] Out: 2000 [Other:2000]  Intake/Output this shift:  No intake/output data recorded. Weight change: -2.099 kg Gen: NAD CVS: bradycardic Resp: Lateral chest rise, no increased work of breathing Abd: benign Ext: no edema, LUE AVF +T/B  Recent Labs  Lab 07/25/20 0726 07/26/20 0336 07/27/20 1028 07/30/20 0722  NA 129* 132* 134* 134*  K 5.0 4.7 4.5 4.9  CL 93* 93* 93* 95*  CO2 '22 22 24 23  '$ GLUCOSE 286* 249* 209* 54*  BUN 49* 35* 51* 62*  CREATININE 11.54* 8.35* 10.49* 12.08*  ALBUMIN 3.4*  --  3.4* 3.3*  CALCIUM 8.8* 9.0 9.0 9.3  PHOS 6.8*  --  5.4* 6.6*   Liver Function Tests: Recent Labs  Lab 07/25/20 0726 07/27/20 1028 07/30/20 0722  ALBUMIN 3.4* 3.4* 3.3*   No results for input(s): LIPASE, AMYLASE in the last 168 hours. No results for input(s): AMMONIA in the last 168 hours. CBC: Recent Labs  Lab 07/25/20 0726 07/26/20 0336 07/27/20 1028 07/30/20 0722  WBC 6.7 6.2 7.5 7.6  HGB 11.5* 12.2 11.8* 11.5*  HCT 36.9 39.0 37.1 36.0  MCV 96.9 96.8 95.6 96.5  PLT 270 227 243 235   Cardiac Enzymes: No results for input(s): CKTOTAL, CKMB, CKMBINDEX, TROPONINI in the last 168 hours. CBG: Recent Labs  Lab 07/30/20 1621 07/30/20 2135 07/31/20 0533 07/31/20 0737 07/31/20 1207  GLUCAP 228* 249* 176* 153* 214*    Iron Studies: No results for input(s): IRON, TIBC, TRANSFERRIN, FERRITIN in the last 72 hours. Studies/Results: No results found. .  stroke: mapping our early  stages of recovery book   Does not apply Once  . amitriptyline  25 mg Oral QHS  . amLODipine  10 mg Oral Daily  . apixaban  5 mg Oral BID  . atorvastatin  80 mg Oral QPM  . cholecalciferol  1,000 Units Oral Daily  . cinacalcet  60 mg Oral Q breakfast  . gabapentin  300 mg Oral Daily  . insulin aspart  0-6 Units Subcutaneous TID WC  . insulin aspart  2 Units Subcutaneous TID WC  . insulin glargine  35 Units Subcutaneous BID  . levothyroxine  100 mcg Oral Q0600  . losartan  50 mg Oral Daily  . polyethylene glycol  17 g Oral Daily  . sevelamer carbonate  800 mg Oral TID WC  . torsemide  20 mg Oral Daily  . vitamin B-12  2,000 mcg Oral Daily  . vitamin E  400 Units Oral Daily    BMET    Component Value Date/Time   NA 134 (L) 07/30/2020 0722   K 4.9 07/30/2020 0722   CL 95 (L) 07/30/2020 0722   CO2 23 07/30/2020 0722   GLUCOSE 54 (L) 07/30/2020 0722   BUN 62 (H) 07/30/2020 0722   CREATININE 12.08 (H) 07/30/2020 0722   CALCIUM 9.3 07/30/2020 0722   GFRNONAA  3 (L) 07/30/2020 0722   CBC    Component Value Date/Time   WBC 7.6 07/30/2020 0722   RBC 3.73 (L) 07/30/2020 0722   HGB 11.5 (L) 07/30/2020 0722   HCT 36.0 07/30/2020 0722   PLT 235 07/30/2020 0722   MCV 96.5 07/30/2020 0722   MCH 30.8 07/30/2020 0722   MCHC 31.9 07/30/2020 0722   RDW 14.6 07/30/2020 0722   LYMPHSABS 1.4 07/19/2020 0505   MONOABS 1.1 (H) 07/19/2020 0505   EOSABS 0.2 07/19/2020 0505   BASOSABS 0.1 07/19/2020 0505    Assessment/Plan:  1. AMS- happened acutely with HD 07/23/20 without drop in BP. CT of head with acute parenchymal hemorrhage within the paramedian left occipital lobe at site of known subacute left PCA territory infarct. Neuro following and recommended repeatCT scanwhich showed no change in hemorrhagic conversion of LMCA and PCA territory infarctions. MS improved.  No further issues with dialysis at this time 2. ESRD - cont with HD on TTS schedule 3. HTN/volume - stable 4. 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 CVA - s/p thrombectomy and was in CIR. 7. Bradycardia - has been persistent and not on beta-blocker. Cardiology has consulted 8. Anemia: Hemoglobin 11.5.  No therapy necessary 9. Disposition- likely transfer back to CIR

## 2020-07-31 NOTE — Discharge Summary (Addendum)
Physician Discharge Summary  Rachael Herrera [id number] DOB: August 09, 1960 DOA: 07/23/2020  PCP: Patient, No Pcp Per (Inactive)  Admit date: 07/23/2020 Discharge date: 08/01/2020  Admitted From: CIR Disposition: SNF  Recommendations for Outpatient Follow-up:  1. Follow up with PCP in 1-2 weeks 2. Please obtain BMP/CBC in one week 3. Follow up wit cardiology for management of A fib. And bradycadia 4.   Discharge Condition: Stable.  CODE STATUS: Full code Diet recommendation: Heart Healthy   Brief/Interim Summary:  60 year old female history of hypertension, type 2 diabetes mellitus with peripheral neuropathy, remote history of hypoglycemic seizure, history of tachybradycardia syndrome was admitted on 2/17 with right-sided weakness, ataxia and left gaze preference, CTA head noted left occipital infarct with a nonocclusive thrombus septic at the left MCA bifurcation, she underwent cerebral angiogram with mechanical thrombectomy and revascularization, follow-up MRI brain noted acute ischemic infarcts in left MCA/PCA territories and a small amount of petechial blood in the left basalganglia, she developed A. fib this admission seen by EP and was started on anticoagulation, she also had episodes of bradycardia, PPM was not felt to be needed at the time, continue to have global aphasia, right-sided weakness with sensory deficits and subsequently discharged to inpatient rehab on 2/24, continued to have severe nonfluent expressive aphasia but was awake alert mute and not following commands on 3/1 she became acutely unresponsive and hemodialysis, CT head noted left occipital hemorrhage at the site of prior infarct as well as petechial hemorrhage within the site of left prior left MCA stroke, neurology was consulted, CTA head noted left MCA to be patent, EEG ordered     Acute encephalopathy, Unresponsive might have be related to HD.  Recent Acute ischemic stroke with hemorrhagic transformation  -Admitted  from rehab following an episode of unresponsiveness, CT head noted 1.9X 1.3 cm intraparenchymal hemorrhage with mild left paramedian shift, petechial hemorrhage in the left basal ganglia at the site of recent MCA infarct -Follow-up CT head with some progression of the hemorrhage which is likely contrast, per Neuro -Eliquis initially held.  -EEG is unremarkable -Mental status is improved and stable-back to baseline global aphasia, appreciate neurology input -PT/OT/SLP following -Discharge back to CIR when bed available. -CT head stable, ok to resume Eliquis per neurology.  -insurance decline CIR> Plan for SNF  -tolerating eliquis.  -remain stable.   Severe hypothyroidism -TSH was very high, started on IV Synthroid , transitioned to po. -Discharge on Synthroid 100 mcg daily.   Bradycardia -Likely secondary to intracranial bleed -Prior history of bradycardia failed PPM insertion last admission -Appreciate EP input, PPM not felt to be indicated at this time, recommended follow-up with outpatient cardiology -Resolved, Asymptomatic  Recent stroke, Recent Acute ischemic stroke with hemorrhagic transformation CTA head noted left occipital infarct with a nonocclusive thrombus septic at the left MCA bifurcation, she underwent cerebral angiogram with mechanical thrombectomy and revascularization, follow-up MRI brain noted acute ischemic infarcts in left MCA/PCA territories and a small amount of petechial blood in the left basalganglia, she developed A. fib this admission seen by EP and was started on anticoagulation -Hospitalized from 2/17 through 2/24 and then sent to CIR before readmission -Will need prolonged rehabilitation  ESRD on hemodialysis -Nephrology following.   Type 2 diabetes mellitus -Continue with Lantus BID and Meals coverage.  -  Paroxysmal atrial fibrillation -Ok to resume Eliquis per neurology. CT head stable.   Stable since yesterday, plan to transfer to  SNF  Discharge Diagnoses:  Active Problems:   Cerebral embolism  with cerebral infarction   Acquired hypothyroidism   Chronic kidney disease, stage IV (severe) ([place])   Acute ischemic left MCA stroke ([place])   ESRD on dialysis ([place])   Bradycardia    Discharge Instructions  Discharge Instructions    Diet - low sodium heart healthy   Complete by: As directed    Diet - low sodium heart healthy   Complete by: As directed    Increase activity slowly   Complete by: As directed    Increase activity slowly   Complete by: As directed    No wound care   Complete by: As directed    No wound care   Complete by: As directed      Allergies as of 08/01/2020   No Known Allergies     Medication List    STOP taking these medications   celecoxib 100 MG capsule Commonly known as: CELEBREX   HumaLOG 100 UNIT/ML injection Generic drug: insulin lispro   insulin glargine 100 UNIT/ML Solostar Pen Commonly known as: LANTUS Replaced by: insulin glargine 100 UNIT/ML injection     TAKE these medications   amitriptyline 25 MG tablet Commonly known as: ELAVIL Take 25 mg by mouth at bedtime.   amLODipine 10 MG tablet Commonly known as: NORVASC Take 10 mg by mouth daily.   apixaban 5 MG Tabs tablet Commonly known as: ELIQUIS Take 1 tablet (5 mg total) by mouth 2 (two) times daily.   atorvastatin 80 MG tablet Commonly known as: LIPITOR Take 80 mg by mouth every evening.   cholecalciferol 25 MCG (1000 UNIT) tablet Commonly known as: VITAMIN D3 Take 1,000 Units by mouth daily.   cinacalcet 60 MG tablet Commonly known as: SENSIPAR Take 60 mg by mouth daily.   gabapentin 300 MG capsule Commonly known as: NEURONTIN Take 300 mg by mouth daily.   insulin aspart 100 UNIT/ML injection Commonly known as: novoLOG Inject 2 Units into the skin 3 (three) times daily with meals.   insulin glargine 100 UNIT/ML injection Commonly known as: LANTUS Inject 0.35 mLs (35 Units total) into the  skin 2 (two) times daily. Replaces: insulin glargine 100 UNIT/ML Solostar Pen   levothyroxine 100 MCG tablet Commonly known as: SYNTHROID Take 1 tablet (100 mcg total) by mouth daily at 6 (six) AM. What changed:   medication strength  how much to take  when to take this   losartan 50 MG tablet Commonly known as: COZAAR Take 50 mg by mouth daily.   polyethylene glycol 17 g packet Commonly known as: MIRALAX / GLYCOLAX Take 17 g by mouth daily.   senna-docusate 8.6-50 MG tablet Commonly known as: Senokot-S Take 1 tablet by mouth at bedtime as needed for mild constipation.   sevelamer carbonate 800 MG tablet Commonly known as: RENVELA Take 800 mg by mouth 3 (three) times daily.   torsemide 20 MG tablet Commonly known as: DEMADEX Take 20 mg by mouth daily.   vitamin B-12 1000 MCG tablet Commonly known as: CYANOCOBALAMIN Take 2,000 mcg by mouth daily.   vitamin E 180 MG (400 UNITS) capsule Take 400 Units by mouth daily.       Contact information for after-discharge care    Destination    [facility] Preferred SNF .   Service: Skilled Nursing Contact information: [address] [place] [phone]                 No Known Allergies  Consultations: Neurology Cardiology   Procedures/Studies:  No results found.   Subjective: Alert aphasic, said one or two words  Discharge Exam: Vitals:   08/01/20 1444 08/01/20 1728  BP: 107/64 (!) 150/56  Pulse: 61 72  Resp: 17 15  Temp: 98.4 F (36.9 C) 97.9 F (36.6 C)  SpO2: 98%      General: Pt is alert, awake, not in acute distress Cardiovascular: RRR, S1/S2 +, no rubs, no gallops Respiratory: CTA bilaterally, no wheezing, no rhonchi Abdominal: Soft, NT, ND, bowel sounds + Extremities: no edema, no cyanosis    The results of significant diagnostics from this hospitalization (including imaging, microbiology, ancillary and laboratory) are listed below for  reference.     Microbiology: No results found for this or any previous visit (from the past 240 hour(s)).   Labs: BNP (last 3 results) No results for input(s): BNP in the last 8760 hours. Basic Metabolic Panel: No results for input(s): NA, K, CL, CO2, GLUCOSE, BUN, CREATININE, CALCIUM, MG, PHOS in the last 168 hours. Liver Function Tests: No results for input(s): AST, ALT, ALKPHOS, BILITOT, PROT, ALBUMIN in the last 168 hours. No results for input(s): LIPASE, AMYLASE in the last 168 hours. No results for input(s): AMMONIA in the last 168 hours. CBC: No results for input(s): WBC, NEUTROABS, HGB, HCT, MCV, PLT in the last 168 hours. Cardiac Enzymes: No results for input(s): CKTOTAL, CKMB, CKMBINDEX, TROPONINI in the last 168 hours. BNP: Invalid input(s): POCBNP CBG: No results for input(s): GLUCAP in the last 168 hours. D-Dimer No results for input(s): DDIMER in the last 72 hours. Hgb A1c No results for input(s): HGBA1C in the last 72 hours. Lipid Profile No results for input(s): CHOL, HDL, LDLCALC, TRIG, CHOLHDL, LDLDIRECT in the last 72 hours. Thyroid function studies No results for input(s): TSH, T4TOTAL, T3FREE, THYROIDAB in the last 72 hours.  Invalid input(s): FREET3 Anemia work up No results for input(s): VITAMINB12, FOLATE, FERRITIN, TIBC, IRON, RETICCTPCT in the last 72 hours. Urinalysis No results found for: COLORURINE, APPEARANCEUR, LABSPEC, [place], GLUCOSEU, HGBUR, BILIRUBINUR, KETONESUR, PROTEINUR, UROBILINOGEN, NITRITE, LEUKOCYTESUR Sepsis Labs Invalid input(s): PROCALCITONIN,  WBC,  LACTICIDVEN Microbiology No results found for this or any previous visit (from the past 240 hour(s)).   Time coordinating discharge: 40 minutes  SIGNED:   Elmarie Shiley, MD  [facility]

## 2020-07-31 NOTE — Progress Notes (Addendum)
Occupational Therapy Treatment Patient Details Name: Rachael Herrera MRN: [id number] DOB: [birth year] Today's Date: 07/31/2020    History of present illness Pt is a 60 y.o. female readmitted from [facility] on 07/23/20 with AMS during HD. Pt admitted to CIR for recent L PCA and MCA infarcts. Pt initially thought to have new hemorrhagic stroke; neurology MD reports, "head CT (3/2) showed hemorrhagic conversion, but looks older, may not explain the acute mental status change." EEG without seizure. Repeat head CT 3/7 with expected evolution of infarcts, decreased mild mass effect; no new findings. PMH includes HTN, CKD IV, DM2, OSA, obesity, thrombectomy 06/2020.   OT comments  Pt readily willing to get OOB, go to bathroom, groom in standing at sink and remain up in chair at end of session. Pt able to self feed using utensils independently and performed toileting an grooming with min guard assist and RW this visit. Updated d/c to SNF as insurance has declined CIR.  Follow Up Recommendations  SNF;Supervision/Assistance - 24 hour    Equipment Recommendations  3 in 1 bedside commode    Recommendations for Other Services      Precautions / Restrictions Precautions Precautions: Fall Precaution Comments: R-side inattention, aphasia       Mobility Bed Mobility Overal bed mobility: Modified Independent             General bed mobility comments: increased time, HOB up    Transfers   Equipment used: Rolling walker (2 wheeled) Transfers: Sit to/from Stand Sit to Stand: Supervision              Balance Overall balance assessment: Needs assistance   Sitting balance-Leahy Scale: Good     Standing balance support: Bilateral upper extremity supported Standing balance-Leahy Scale: Poor Standing balance comment: UE support                           ADL either performed or assessed with clinical judgement   ADL Overall ADL's : Needs assistance/impaired Eating/Feeding:  Independent;Sitting Eating/Feeding Details (indicate cue type and reason): opening containers, using utensils Grooming: Wash/dry face;Oral care;Brushing hair;Standing;Supervision/safety Grooming Details (indicate cue type and reason): much improved thoroughness         Upper Body Dressing : Set up;Sitting Upper Body Dressing Details (indicate cue type and reason): front opening gown     Toilet Transfer: Min guard;Ambulation;BSC;RW   Toileting- Water quality scientist and Hygiene: Min guard;Sit to/from stand       Functional mobility during ADLs: Rolling walker;Min guard       Vision       Perception     Praxis      Cognition Arousal/Alertness: Awake/alert Behavior During Therapy: Flat affect Overall Cognitive Status: Difficult to assess                                          Exercises     Shoulder Instructions       General Comments      Pertinent Vitals/ Pain       Pain Assessment: No/denies pain  Home Living                                          Prior Functioning/Environment  Frequency  Min 2X/week        Progress Toward Goals  OT Goals(current goals can now be found in the care plan section)  Progress towards OT goals: Progressing toward goals  Acute Rehab OT Goals Patient Stated Goal: [facility]-acute re[facility] at [facility] since insurance denied CIR OT Goal Formulation: With patient/family Time For Goal Achievement: 08/07/20 Potential to Achieve Goals: Good  Plan Discharge plan needs to be updated    Co-evaluation                 AM-PAC OT "6 Clicks" Daily Activity     Outcome Measure   Help from another person eating meals?: None Help from another person taking care of personal grooming?: A Little Help from another person toileting, which includes using toliet, bedpan, or urinal?: A Little Help from another person bathing (including washing, rinsing, drying)?: A Little Help from  another person to put on and taking off regular upper body clothing?: A Little Help from another person to put on and taking off regular lower body clothing?: A Little 6 Click Score: 19    End of Session Equipment Utilized During Treatment: Gait belt;Rolling walker  OT Visit Diagnosis: Unsteadiness on feet (R26.81);Muscle weakness (generalized) (M62.81);Cognitive communication deficit (R41.841);Hemiplegia and hemiparesis   Activity Tolerance Patient tolerated treatment well   Patient Left in chair;with call bell/phone within reach;with bed alarm set   Nurse Communication          Time: [phone] OT Time Calculation (min): 23 min  Charges: OT General Charges $OT Visit: 1 Visit OT Treatments $Self Care/Home Management : 23-37 mins  Nestor Lewandowsky, OTR/L Acute Rehabilitation Services Pager: [phone] Office: [phone]   Malka So 07/31/2020, 10:32 AM

## 2020-08-01 LAB — CBC
HCT: 36.4 % (ref 36.0–46.0)
Hemoglobin: 11.5 g/dL — ABNORMAL LOW (ref 12.0–15.0)
MCH: 30.7 pg (ref 26.0–34.0)
MCHC: 31.6 g/dL (ref 30.0–36.0)
MCV: 97.1 fL (ref 80.0–100.0)
Platelets: 230 10*3/uL (ref 150–400)
RBC: 3.75 MIL/uL — ABNORMAL LOW (ref 3.87–5.11)
RDW: 14.6 % (ref 11.5–15.5)
WBC: 6.5 10*3/uL (ref 4.0–10.5)
nRBC: 0 % (ref 0.0–0.2)

## 2020-08-01 LAB — RENAL FUNCTION PANEL
Albumin: 3.1 g/dL — ABNORMAL LOW (ref 3.5–5.0)
Anion gap: 14 (ref 5–15)
BUN: 63 mg/dL — ABNORMAL HIGH (ref 8–23)
CO2: 26 mmol/L (ref 22–32)
Calcium: 9.1 mg/dL (ref 8.9–10.3)
Chloride: 90 mmol/L — ABNORMAL LOW (ref 98–111)
Creatinine, Ser: 11.24 mg/dL — ABNORMAL HIGH (ref 0.44–1.00)
GFR, Estimated: 4 mL/min — ABNORMAL LOW (ref >60)
Glucose, Bld: 117 mg/dL — ABNORMAL HIGH (ref 70–99)
Phosphorus: 5.3 mg/dL — ABNORMAL HIGH (ref 2.5–4.6)
Potassium: 4.9 mmol/L (ref 3.5–5.1)
Sodium: 130 mmol/L — ABNORMAL LOW (ref 135–145)

## 2020-08-01 LAB — MISC LABCORP TEST (SEND OUT): Labcorp test code: 83962

## 2020-08-01 LAB — GLUCOSE, CAPILLARY
Glucose-Capillary: 101 mg/dL — ABNORMAL HIGH (ref 70–99)
Glucose-Capillary: 209 mg/dL — ABNORMAL HIGH (ref 70–99)
Glucose-Capillary: 286 mg/dL — ABNORMAL HIGH (ref 70–99)

## 2020-08-01 NOTE — Progress Notes (Signed)
Physical Therapy Treatment Patient Details Name: Rachael Herrera MRN: [id number] DOB: 23-Dec-1960 Today's Date: 08/01/2020    History of Present Illness Pt is a 60 y.o. female readmitted from [facility] on 07/23/20 with AMS during HD. Pt admitted to CIR for recent L PCA and MCA infarcts. Pt initially thought to have new hemorrhagic stroke; neurology MD reports, "head CT (3/2) showed hemorrhagic conversion, but looks older, may not explain the acute mental status change." EEG without seizure. Repeat head CT 3/7 with expected evolution of infarcts, decreased mild mass effect; no new findings. PMH includes HTN, CKD IV, DM2, OSA, obesity, thrombectomy 06/2020.   PT Comments    Pt progressing with mobility. Today's session focused on transfer and gait training with RW; pt continues to require frequent cues for safety, particularly in regards to R-side inattention (frequently running into objects) and decreased activity (need to slow down pace, seated rest break, etc.). Pt with expressive aphasia, but seems better able to express more complete thoughts this session; still with jargon during verbal expression. Pt remains motivated to participate; plans to d/c to SNF today.    Follow Up Recommendations  SNF;Supervision for mobility/OOB (CIR declined)     Equipment Recommendations  None recommended by PT    Recommendations for Other Services       Precautions / Restrictions Precautions Precautions: Fall;Other (comment) Precaution Comments: R-side inattention, aphasia Restrictions Weight Bearing Restrictions: No    Mobility  Bed Mobility Overal bed mobility: Modified Independent Bed Mobility: Supine to Sit;Sit to Supine           General bed mobility comments: increased time, HOB elevated    Transfers Overall transfer level: Needs assistance Equipment used: Rolling walker (2 wheeled) Transfers: Sit to/from Stand Sit to Stand: Supervision         General transfer comment: Reliant on  momentum to power into standing from EOB and chair without armrests; pt declining attempt without RW  Ambulation/Gait Ambulation/Gait assistance: Min guard Gait Distance (Feet): 80 Feet (+80') Assistive device: Rolling walker (2 wheeled) Gait Pattern/deviations: Step-through pattern;Decreased stride length;Wide base of support;Trunk flexed   Gait velocity interpretation: 1.31 - 2.62 ft/sec, indicative of limited community ambulator General Gait Details: Fast ambulation speed with RW and close min guard for balance; pt running into objects on R-side with RW wheels requiring cues to slow down and adjust; required cues to monitor activity tolerance and need for seated rest break secondary to SOB and fatigue. Increased time for turning with difficulty managing RW at times   Stairs             Wheelchair Mobility    Modified Rankin (Stroke Patients Only)       Balance Overall balance assessment: Needs assistance Sitting-balance support: Feet supported Sitting balance-Leahy Scale: Good     Standing balance support: Bilateral upper extremity supported Standing balance-Leahy Scale: Fair Standing balance comment: Can static stand without UE support, pt wanting RW support for static and dynamic stability                            Cognition Arousal/Alertness: Awake/alert Behavior During Therapy: Flat affect Overall Cognitive Status: Difficult to assess Area of Impairment: Problem solving;Awareness;Safety/judgement;Following commands;Attention                                      Exercises      General  Comments General comments (skin integrity, edema, etc.): HR 70s-110s; post-ambulation BP 122/97 (via R forearm)      Pertinent Vitals/Pain Pain Assessment: Faces Faces Pain Scale: No hurt Pain Intervention(s): Monitored during session    Home Living                      Prior Function            PT Goals (current goals can now be  found in the care plan section)      Frequency    Min 3X/week      PT Plan Current plan remains appropriate    Co-evaluation              AM-PAC PT "6 Clicks" Mobility   Outcome Measure  Help needed turning from your back to your side while in a flat bed without using bedrails?: None Help needed moving from lying on your back to sitting on the side of a flat bed without using bedrails?: A Little Help needed moving to and from a bed to a chair (including a wheelchair)?: A Little Help needed standing up from a chair using your arms (e.g., wheelchair or bedside chair)?: A Little Help needed to walk in hospital room?: A Little Help needed climbing 3-5 steps with a railing? : A Lot 6 Click Score: 18    End of Session Equipment Utilized During Treatment: Gait belt Activity Tolerance: Patient tolerated treatment well Patient left: in bed;with call bell/phone within reach;with bed alarm set Nurse Communication: Mobility status PT Visit Diagnosis: Muscle weakness (generalized) (M62.81);Other abnormalities of gait and mobility (R26.89);Unsteadiness on feet (R26.81);Difficulty in walking, not elsewhere classified (R26.2);Hemiplegia and hemiparesis Hemiplegia - dominant/non-dominant: Dominant Hemiplegia - caused by: Nontraumatic intracerebral hemorrhage     Time: [id number] PT Time Calculation (min) (ACUTE ONLY): 24 min  Charges:  $Gait Training: 8-22 mins $Therapeutic Activity: 8-22 mins                     Mabeline Caras, PT, DPT Acute Rehabilitation Services  Pager [phone] Office [place] 08/01/2020, 5:36 PM

## 2020-08-01 NOTE — TOC Progression Note (Signed)
Transition of Care [facility]) - Progression Note    Patient Details  Name: Rachael Herrera MRN: [id number] Date of Birth: 15-Sep-1960  Transition of Care [facility]) CM/SW [place], [place] Phone Number: 08/01/2020, 11:43 AM  Clinical Narrative:     CSW received insurance authorization approval for patient. [place] ID# [id number]. Approval number is # [id number]. Start date is for 3/10. Next review date is 3/14. CSW informed MD.  Patient has SNF bed at [facility]. Insurance authorization has been approved.   CSW will continue to follow.    Expected Discharge Plan: [place] Barriers to Discharge: Continued Medical Work up  Expected Discharge Plan and Services Expected Discharge Plan: [place] In-house Referral: Clinical Social Work Discharge Planning Services: CM Consult Post Acute Care Choice: [place] Living arrangements for the past 2 months:  (Patient was previously at [facility] inpatient rehab) Expected Discharge Date: 07/29/20               DME Arranged: N/A DME Agency: NA       HH Arranged: NA HH Agency: NA         Social Determinants of Health (SDOH) Interventions    Readmission Risk Interventions No flowsheet data found.

## 2020-08-01 NOTE — TOC Transition Note (Addendum)
Transition of Care [facility]) - CM/SW Discharge Note   Patient Details  Name: Rachael Herrera MRN: [id number] Date of Birth: Nov 12, 1960  Transition of Care [facility]) CM/SW Contact:  Trula Ore, [place] Phone Number: 08/01/2020, 1:47 PM   Clinical Narrative:     Patient will DC to: [place] SNF   Anticipated DC date: 08/01/2020  Family notified: Doris   Transport by: Corey Harold   ?  Per MD patient ready for DC to [facility] . RN, patient, patient's family, renal navigator, and facility notified of DC. Discharge Summary sent to facility.Patients daughter will bring Cpap from home to [facility]. RN given number for report [phone] RM#[phone]. DC packet on chart. Ambulance transport requested for patient.  CSW signing off.    Final next level of care: Skilled Nursing Facility Barriers to Discharge: No Barriers Identified   Patient Goals and CMS Choice Patient states their goals for this hospitalization and ongoing recovery are:: SNF CMS Medicare.gov Compare Post Acute Care list provided to:: Patient Represenative (must comment) (Doris daughter) Choice offered to / list presented to : Adult Children Tamela Oddi)  Discharge Placement              Patient chooses bed at: [facility] Patient to be transferred to facility by: [place] Name of family member notified: Doris Patient and family notified of of transfer: 08/01/20  Discharge Plan and Services In-house Referral: Clinical Social Work Discharge Planning Services: CM Consult Post Acute Care Choice: [place]          DME Arranged: N/A DME Agency: NA       HH Arranged: NA HH Agency: NA        Social Determinants of Health (SDOH) Interventions     Readmission Risk Interventions No flowsheet data found.

## 2020-08-01 NOTE — Progress Notes (Signed)
OT Cancellation Note  Patient Details Name: Rachael Herrera MRN: [id number] DOB: [birth year]   Cancelled Treatment:    Reason Eval/Treat Not Completed: Patient at procedure or test/ unavailable (HD). Will continue to follow.   Malka So 08/01/2020, 9:10 AM  Nestor Lewandowsky, OTR/L Acute Rehabilitation Services Pager: [phone] Office: [phone]

## 2020-08-01 NOTE — Progress Notes (Signed)
Patient ID: Rachael Herrera, female   DOB: 10/09/1960, 60 y.o.   MRN: [id number] S: Patient is lying in bed, resting with no distress.  No complaints today.  Planning for dialysis O:BP 131/69 (BP Location: Right Arm)   Pulse (!) 58   Temp 98 F (36.7 C) (Oral)   Resp 16   Ht '5\' 8"'$  (1.727 m)   Wt 113.1 kg   SpO2 99%   BMI 37.91 kg/m   Intake/Output Summary (Last 24 hours) at 08/01/2020 1333 Last data filed at 08/01/2020 1245 Gross per 24 hour  Intake 240 ml  Output 2500 ml  Net -2260 ml   Intake/Output: I/O last 3 completed shifts: In: 220 [P.O.:220] Out: -   Intake/Output this shift:  Total I/O In: 240 [P.O.:240] Out: 2500 [Other:2500] Weight change:  Gen: NAD CVS: bradycardic Resp: Bilateral chest rise, no increased work of breathing Abd: benign Ext: no edema, LUE AVF +T/B  Recent Labs  Lab 07/26/20 0336 07/27/20 1028 07/30/20 0722 08/01/20 0703  NA 132* 134* 134* 130*  K 4.7 4.5 4.9 4.9  CL 93* 93* 95* 90*  CO2 '22 24 23 26  '$ GLUCOSE 249* 209* 54* 117*  BUN 35* 51* 62* 63*  CREATININE 8.35* 10.49* 12.08* 11.24*  ALBUMIN  --  3.4* 3.3* 3.1*  CALCIUM 9.0 9.0 9.3 9.1  PHOS  --  5.4* 6.6* 5.3*   Liver Function Tests: Recent Labs  Lab 07/27/20 1028 07/30/20 0722 08/01/20 0703  ALBUMIN 3.4* 3.3* 3.1*   No results for input(s): LIPASE, AMYLASE in the last 168 hours. No results for input(s): AMMONIA in the last 168 hours. CBC: Recent Labs  Lab 07/26/20 0336 07/27/20 1028 07/30/20 0722 08/01/20 0703  WBC 6.2 7.5 7.6 6.5  HGB 12.2 11.8* 11.5* 11.5*  HCT 39.0 37.1 36.0 36.4  MCV 96.8 95.6 96.5 97.1  PLT 227 243 235 230   Cardiac Enzymes: No results for input(s): CKTOTAL, CKMB, CKMBINDEX, TROPONINI in the last 168 hours. CBG: Recent Labs  Lab 07/31/20 0737 07/31/20 1207 07/31/20 1652 07/31/20 2046 08/01/20 0811  GLUCAP 153* 214* 201* 179* 101*    Iron Studies: No results for input(s): IRON, TIBC, TRANSFERRIN, FERRITIN in the last 72  hours. Studies/Results: No results found. .  stroke: mapping our early stages of recovery book   Does not apply Once  . amitriptyline  25 mg Oral QHS  . amLODipine  10 mg Oral Daily  . apixaban  5 mg Oral BID  . atorvastatin  80 mg Oral QPM  . cholecalciferol  1,000 Units Oral Daily  . cinacalcet  60 mg Oral Q breakfast  . gabapentin  300 mg Oral Daily  . insulin aspart  0-6 Units Subcutaneous TID WC  . insulin aspart  2 Units Subcutaneous TID WC  . insulin glargine  35 Units Subcutaneous BID  . levothyroxine  100 mcg Oral Q0600  . losartan  50 mg Oral Daily  . polyethylene glycol  17 g Oral Daily  . sevelamer carbonate  800 mg Oral TID WC  . torsemide  20 mg Oral Daily  . vitamin B-12  2,000 mcg Oral Daily  . vitamin E  400 Units Oral Daily    BMET    Component Value Date/Time   NA 130 (L) 08/01/2020 0703   K 4.9 08/01/2020 0703   CL 90 (L) 08/01/2020 0703   CO2 26 08/01/2020 0703   GLUCOSE 117 (H) 08/01/2020 0703   BUN 63 (H) 08/01/2020 0703  CREATININE 11.24 (H) 08/01/2020 0703   CALCIUM 9.1 08/01/2020 0703   GFRNONAA 4 (L) 08/01/2020 0703   CBC    Component Value Date/Time   WBC 6.5 08/01/2020 0703   RBC 3.75 (L) 08/01/2020 0703   HGB 11.5 (L) 08/01/2020 0703   HCT 36.4 08/01/2020 0703   PLT 230 08/01/2020 0703   MCV 97.1 08/01/2020 0703   MCH 30.7 08/01/2020 0703   MCHC 31.6 08/01/2020 0703   RDW 14.6 08/01/2020 0703   LYMPHSABS 1.4 07/19/2020 0505   MONOABS 1.1 (H) 07/19/2020 0505   EOSABS 0.2 07/19/2020 0505   BASOSABS 0.1 07/19/2020 0505    Assessment/Plan:  1. AMS- happened acutely with HD 07/23/20 without drop in BP. CT of head with acute parenchymal hemorrhage within the paramedian left occipital lobe at site of known subacute left PCA territory infarct. Neuro following and recommended repeatCT scanwhich showed no change in hemorrhagic conversion of LMCA and PCA territory infarctions. MS improved.  No further issues with dialysis at this  time 2. ESRD - cont with HD on TTS schedule 3. HTN/volume - stable 4. 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 CVA - s/p thrombectomy and was in CIR. 7. Bradycardia - has been persistent and not on beta-blocker. Cardiology has consulted 8. Anemia: Hemoglobin 11.5.  No therapy necessary 9. Disposition- likely transfer back to CIR

## 2020-08-01 NOTE — Progress Notes (Signed)
PT Cancellation Note  Patient Details Name: Rachael Herrera MRN: [id number] DOB: 1960-12-09   Cancelled Treatment:    Reason Eval/Treat Not Completed: Patient at procedure or test/unavailable (HD). Will follow-up for PT treatment as schedule permits.  Mabeline Caras, PT, DPT Acute Rehabilitation Services  Pager [phone] Office [place] 08/01/2020, 9:30 AM

## 2020-10-24 ENCOUNTER — Other Ambulatory Visit: Payer: Self-pay

## 2020-10-24 NOTE — Patient Outreach (Signed)
[place] [facility]) Care Management  10/24/2020  Rachael Herrera 05/11/1961 [id number]   First telephone outreach attempt to obtain mRS. No answer. Left message for returned call.  Philmore Pali [facility] Management Assistant [phone]

## 2020-10-29 ENCOUNTER — Other Ambulatory Visit: Payer: Self-pay

## 2020-10-29 NOTE — Patient Outreach (Signed)
[place] [facility]) Care Management  10/29/2020  DEB MORICI [birth year] [id number]   Second telephone outreach attempt to obtain mRS. No answer. Left message for returned call.  Philmore Pali [facility] Management Assistant [phone]

## 2020-10-30 ENCOUNTER — Other Ambulatory Visit: Payer: Self-pay

## 2020-10-30 NOTE — Patient Outreach (Signed)
[place] [facility]) Care Management  10/30/2020  SAREEN REYMOND [birth year] [id number]   Telephone outreach to patient to obtain mRS was successfully completed. MRS= [place] Care Management Assistant

## 2021-05-02 ENCOUNTER — Emergency Department (HOSPITAL_BASED_OUTPATIENT_CLINIC_OR_DEPARTMENT_OTHER): Payer: Medicare Other

## 2021-05-02 ENCOUNTER — Inpatient Hospital Stay (HOSPITAL_BASED_OUTPATIENT_CLINIC_OR_DEPARTMENT_OTHER)
Admission: EM | Admit: 2021-05-02 | Discharge: 2021-05-04 | DRG: 640 | Disposition: A | Discharge status: Home Health | Payer: Medicare Other | Attending: Family Medicine | Admitting: Family Medicine

## 2021-05-02 ENCOUNTER — Other Ambulatory Visit: Payer: Self-pay

## 2021-05-02 ENCOUNTER — Encounter (HOSPITAL_BASED_OUTPATIENT_CLINIC_OR_DEPARTMENT_OTHER): Payer: Self-pay

## 2021-05-02 DIAGNOSIS — U071 COVID-19: Secondary | ICD-10-CM | POA: Diagnosis present

## 2021-05-02 DIAGNOSIS — E039 Hypothyroidism, unspecified: Secondary | ICD-10-CM | POA: Diagnosis present

## 2021-05-02 DIAGNOSIS — E782 Mixed hyperlipidemia: Secondary | ICD-10-CM | POA: Diagnosis present

## 2021-05-02 DIAGNOSIS — I10 Essential (primary) hypertension: Secondary | ICD-10-CM | POA: Diagnosis not present

## 2021-05-02 DIAGNOSIS — E1151 Type 2 diabetes mellitus with diabetic peripheral angiopathy without gangrene: Secondary | ICD-10-CM | POA: Diagnosis present

## 2021-05-02 DIAGNOSIS — Z992 Dependence on renal dialysis: Secondary | ICD-10-CM | POA: Diagnosis not present

## 2021-05-02 DIAGNOSIS — E1140 Type 2 diabetes mellitus with diabetic neuropathy, unspecified: Secondary | ICD-10-CM | POA: Diagnosis present

## 2021-05-02 DIAGNOSIS — J9621 Acute and chronic respiratory failure with hypoxia: Secondary | ICD-10-CM | POA: Diagnosis present

## 2021-05-02 DIAGNOSIS — N186 End stage renal disease: Secondary | ICD-10-CM

## 2021-05-02 DIAGNOSIS — E11649 Type 2 diabetes mellitus with hypoglycemia without coma: Secondary | ICD-10-CM | POA: Diagnosis not present

## 2021-05-02 DIAGNOSIS — J1282 Pneumonia due to coronavirus disease 2019: Secondary | ICD-10-CM | POA: Diagnosis present

## 2021-05-02 DIAGNOSIS — I2510 Atherosclerotic heart disease of native coronary artery without angina pectoris: Secondary | ICD-10-CM | POA: Diagnosis present

## 2021-05-02 DIAGNOSIS — E785 Hyperlipidemia, unspecified: Secondary | ICD-10-CM

## 2021-05-02 DIAGNOSIS — E538 Deficiency of other specified B group vitamins: Secondary | ICD-10-CM | POA: Diagnosis present

## 2021-05-02 DIAGNOSIS — N2581 Secondary hyperparathyroidism of renal origin: Secondary | ICD-10-CM | POA: Diagnosis present

## 2021-05-02 DIAGNOSIS — R7989 Other specified abnormal findings of blood chemistry: Secondary | ICD-10-CM

## 2021-05-02 DIAGNOSIS — Z8249 Family history of ischemic heart disease and other diseases of the circulatory system: Secondary | ICD-10-CM | POA: Diagnosis not present

## 2021-05-02 DIAGNOSIS — Z7901 Long term (current) use of anticoagulants: Secondary | ICD-10-CM

## 2021-05-02 DIAGNOSIS — E1122 Type 2 diabetes mellitus with diabetic chronic kidney disease: Secondary | ICD-10-CM | POA: Diagnosis present

## 2021-05-02 DIAGNOSIS — J9601 Acute respiratory failure with hypoxia: Secondary | ICD-10-CM

## 2021-05-02 DIAGNOSIS — E8770 Fluid overload, unspecified: Secondary | ICD-10-CM | POA: Diagnosis present

## 2021-05-02 DIAGNOSIS — I5033 Acute on chronic diastolic (congestive) heart failure: Secondary | ICD-10-CM | POA: Diagnosis present

## 2021-05-02 DIAGNOSIS — Z833 Family history of diabetes mellitus: Secondary | ICD-10-CM

## 2021-05-02 DIAGNOSIS — J984 Other disorders of lung: Secondary | ICD-10-CM

## 2021-05-02 DIAGNOSIS — I482 Chronic atrial fibrillation, unspecified: Secondary | ICD-10-CM

## 2021-05-02 DIAGNOSIS — E669 Obesity, unspecified: Secondary | ICD-10-CM | POA: Diagnosis present

## 2021-05-02 DIAGNOSIS — I132 Hypertensive heart and chronic kidney disease with heart failure and with stage 5 chronic kidney disease, or end stage renal disease: Secondary | ICD-10-CM | POA: Diagnosis present

## 2021-05-02 DIAGNOSIS — I69320 Aphasia following cerebral infarction: Secondary | ICD-10-CM

## 2021-05-02 DIAGNOSIS — Z79890 Hormone replacement therapy (postmenopausal): Secondary | ICD-10-CM

## 2021-05-02 DIAGNOSIS — Z6836 Body mass index (BMI) 36.0-36.9, adult: Secondary | ICD-10-CM | POA: Diagnosis not present

## 2021-05-02 DIAGNOSIS — K219 Gastro-esophageal reflux disease without esophagitis: Secondary | ICD-10-CM | POA: Diagnosis present

## 2021-05-02 DIAGNOSIS — G4733 Obstructive sleep apnea (adult) (pediatric): Secondary | ICD-10-CM | POA: Diagnosis present

## 2021-05-02 DIAGNOSIS — R000 Tachycardia, unspecified: Secondary | ICD-10-CM | POA: Diagnosis present

## 2021-05-02 DIAGNOSIS — Z8673 Personal history of transient ischemic attack (TIA), and cerebral infarction without residual deficits: Secondary | ICD-10-CM

## 2021-05-02 DIAGNOSIS — D631 Anemia in chronic kidney disease: Secondary | ICD-10-CM | POA: Diagnosis present

## 2021-05-02 DIAGNOSIS — Z794 Long term (current) use of insulin: Secondary | ICD-10-CM

## 2021-05-02 DIAGNOSIS — J189 Pneumonia, unspecified organism: Secondary | ICD-10-CM | POA: Diagnosis not present

## 2021-05-02 DIAGNOSIS — E119 Type 2 diabetes mellitus without complications: Secondary | ICD-10-CM

## 2021-05-02 DIAGNOSIS — Z79899 Other long term (current) drug therapy: Secondary | ICD-10-CM

## 2021-05-02 LAB — CBC WITH DIFFERENTIAL/PLATELET
Abs Immature Granulocytes: 0.02 10*3/uL (ref 0.00–0.07)
Basophils Absolute: 0 10*3/uL (ref 0.0–0.1)
Basophils Relative: 1 %
Eosinophils Absolute: 0.1 10*3/uL (ref 0.0–0.5)
Eosinophils Relative: 3 %
HCT: 34.8 % — ABNORMAL LOW (ref 36.0–46.0)
Hemoglobin: 10.1 g/dL — ABNORMAL LOW (ref 12.0–15.0)
Immature Granulocytes: 0 %
Lymphocytes Relative: 11 %
Lymphs Abs: 0.6 10*3/uL — ABNORMAL LOW (ref 0.7–4.0)
MCH: 27.6 pg (ref 26.0–34.0)
MCHC: 29 g/dL — ABNORMAL LOW (ref 30.0–36.0)
MCV: 95.1 fL (ref 80.0–100.0)
Monocytes Absolute: 0.6 10*3/uL (ref 0.1–1.0)
Monocytes Relative: 12 %
Neutro Abs: 3.6 10*3/uL (ref 1.7–7.7)
Neutrophils Relative %: 73 %
Platelets: 171 10*3/uL (ref 150–400)
RBC: 3.66 MIL/uL — ABNORMAL LOW (ref 3.87–5.11)
RDW: 16.6 % — ABNORMAL HIGH (ref 11.5–15.5)
WBC: 5 10*3/uL (ref 4.0–10.5)
nRBC: 0 % (ref 0.0–0.2)

## 2021-05-02 LAB — CBG MONITORING, ED: Glucose-Capillary: 86 mg/dL (ref 70–99)

## 2021-05-02 LAB — RESP PANEL BY RT-PCR (FLU A&B, COVID) ARPGX2
Influenza A by PCR: NEGATIVE
Influenza B by PCR: NEGATIVE
SARS Coronavirus 2 by RT PCR: POSITIVE — AB

## 2021-05-02 LAB — BASIC METABOLIC PANEL
Anion gap: 11 (ref 5–15)
BUN: 26 mg/dL — ABNORMAL HIGH (ref 8–23)
CO2: 27 mmol/L (ref 22–32)
Calcium: 8.6 mg/dL — ABNORMAL LOW (ref 8.9–10.3)
Chloride: 98 mmol/L (ref 98–111)
Creatinine, Ser: 5.11 mg/dL — ABNORMAL HIGH (ref 0.44–1.00)
GFR, Estimated: 9 mL/min — ABNORMAL LOW (ref >60)
Glucose, Bld: 90 mg/dL (ref 70–99)
Potassium: 3.8 mmol/L (ref 3.5–5.1)
Sodium: 136 mmol/L (ref 135–145)

## 2021-05-02 LAB — TROPONIN I (HIGH SENSITIVITY)
Troponin I (High Sensitivity): 14 ng/L (ref <18)
Troponin I (High Sensitivity): 13 ng/L (ref <18)

## 2021-05-02 LAB — BRAIN NATRIURETIC PEPTIDE: B Natriuretic Peptide: 1659.9 pg/mL — ABNORMAL HIGH (ref 0.0–100.0)

## 2021-05-02 MED ORDER — SODIUM CHLORIDE 0.9 % IV SOLN
1.0000 g | Freq: Once | INTRAVENOUS | Status: AC
  Administered 2021-05-02: 1 g via INTRAVENOUS
  Filled 2021-05-02: qty 10

## 2021-05-02 MED ORDER — IOHEXOL 350 MG/ML SOLN
80.0000 mL | Freq: Once | INTRAVENOUS | Status: AC | PRN
  Administered 2021-05-02: 80 mL via INTRAVENOUS

## 2021-05-02 MED ORDER — SODIUM CHLORIDE 0.9 % IV SOLN
500.0000 mg | Freq: Once | INTRAVENOUS | Status: AC
  Administered 2021-05-02: 500 mg via INTRAVENOUS
  Filled 2021-05-02: qty 5

## 2021-05-02 NOTE — ED Triage Notes (Addendum)
Pt arrives with daughter who reports patient has had cough, congestion, and SOB for a few days had her flu shot 04-22-21. Pt arrives on [place] 1L from home with O2 reading at 73%. Pt upped to 4 L in triage with little improvement to the 80s. RT at bedside placing patient on nonrebreather. Daughter at bedside reports productive cough at home., Also reports patient is dialysis patient restricted on the left with last treatment yesterday, did have full treatment. Daughter reports productive green cough, also states she has seen blood in sputum 2x.

## 2021-05-02 NOTE — ED Notes (Signed)
Turned patient down to 5 liters O2 via [place]

## 2021-05-02 NOTE — ED Provider Notes (Signed)
[facility] Her[facility] EMERGENCY DEPARTMENT Provider Note   CSN: [id number] Arrival date & time: 05/02/21  9381     History Chief Complaint  Patient presents with   Cough   Shortness of Breath    Rachael Herrera is a 60 y.o. female with PMH ESRD on dialysis Tuesday Thursday Saturday, compliant, CVA, history of cavitary pneumonia who presents emergency department for evaluation of cough, shortness of breath and hypoxia.  History obtained from patient's daughter who states that the patient has had cough congestion shortness of breath for approximately 2 to 3 days.  Patient is on 3 L home O2 and on her home oxygen she arrives saturating 73%.  Daughter reports frequent coughing with production of rusty brown and bloody sputum.  Patient is anticoagulated on Eliquis.  Patient denies chest pain, abdominal pain, nausea, vomiting or fever.   Cough Associated symptoms: shortness of breath   Associated symptoms: no chest pain, no chills, no ear pain, no fever, no rash and no sore throat   Shortness of Breath Associated symptoms: cough   Associated symptoms: no abdominal pain, no chest pain, no ear pain, no fever, no rash, no sore throat and no vomiting       Past Medical History:  Diagnosis Date   Cavitary pneumonia 06/2017   due to MSSA   Claudication of both lower extremities ([place])    DDD (degenerative disc disease), cervical    DDD (degenerative disc disease), lumbosacral    Diabetes mellitus ([place])    Diabetic neuropathy ([place])    ESRD (end stage renal disease) on dialysis ([place])    GERD (gastroesophageal reflux disease)    NSVT (nonsustained ventricular tachycardia) 10/20201   OSA on CPAP    Stroke ([place])    Ulnar neuropathy    BUE   Vitamin B 12 deficiency     Patient Active Problem List   Diagnosis Date Noted   Intracerebral hemorrhage 07/29/2020   ESRD on dialysis ([place]) 07/23/2020   Bradycardia 07/23/2020   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   Acute  ischemic left MCA stroke ([place]) 07/18/2020   Cerebral embolism with cerebral infarction 07/11/2020   Stroke ([place]) 07/11/2020   Comprehensive diabetic foot examination, type 2 DM, encounter for ([place]) 09/25/2019   OSA (obstructive sleep apnea) 11/21/2018   Acquired hypothyroidism 05/04/2014   Chronic kidney disease, stage IV (severe) ([place]) 05/04/2014   Essential hypertension 05/04/2014   Hyperlipidemia due to type 1 diabetes mellitus ([place]) 05/04/2014   Obesity 05/04/2014    Past Surgical History:  Procedure Laterality Date   IR ANGIO VERTEBRAL SEL VERTEBRAL UNI L MOD SED  07/11/2020   IR CT HEAD LTD  07/11/2020   IR PERCUTANEOUS ART THROMBECTOMY/INFUSION INTRACRANIAL INC DIAG ANGIO  07/11/2020   IR US GUIDE VASC ACCESS LEFT  07/12/2020   RADIOLOGY WITH ANESTHESIA N/A 07/11/2020   Procedure: IR WITH ANESTHESIA;  Surgeon: Radiologist, Medication, MD;  Location: [place];  Service: Radiology;  Laterality: N/A;     OB History   No obstetric history on file.     Family History  Problem Relation Age of Onset   Diabetes Mother    Heart disease Mother    Diabetes Father    Heart disease Father    Diabetes Sister     Social History   Tobacco Use   Smoking status: Never   Smokeless tobacco: Never  Vaping Use   Vaping Use: Never used  Substance Use Topics   Alcohol use: Not Currently  Drug use: Not Currently    Home Medications Prior to Admission medications   Medication Sig Start Date End Date Taking? Authorizing Provider  amitriptyline (ELAVIL) 25 MG tablet Take 25 mg by mouth at bedtime. 06/16/20   [provider]  amLODipine (NORVASC) 10 MG tablet Take 10 mg by mouth daily. 06/16/20   [provider]  apixaban (ELIQUIS) 5 MG TABS tablet Take 1 tablet (5 mg total) by mouth 2 (two) times daily. 07/29/20   Regalado, Belkys A, MD  atorvastatin (LIPITOR) 80 MG tablet Take 80 mg by mouth every evening. 04/06/19   [provider]  cholecalciferol (VITAMIN D3) 25  MCG (1000 UNIT) tablet Take 1,000 Units by mouth daily.    [provider]  cinacalcet (SENSIPAR) 60 MG tablet Take 60 mg by mouth daily.    [provider]  gabapentin (NEURONTIN) 300 MG capsule Take 300 mg by mouth daily. 04/30/14   [provider]  insulin aspart (NOVOLOG) 100 UNIT/ML injection Inject 2 Units into the skin 3 (three) times daily with meals. 07/29/20   Regalado, Belkys A, MD  insulin glargine (LANTUS) 100 UNIT/ML injection Inject 0.35 mLs (35 Units total) into the skin 2 (two) times daily. 07/29/20   Regalado, Belkys A, MD  levothyroxine (SYNTHROID) 100 MCG tablet Take 1 tablet (100 mcg total) by mouth daily at 6 (six) AM. 07/30/20   Regalado, Belkys A, MD  losartan (COZAAR) 50 MG tablet Take 50 mg by mouth daily. 06/16/20   [provider]  polyethylene glycol (MIRALAX / GLYCOLAX) 17 g packet Take 17 g by mouth daily. 07/30/20   Regalado, Belkys A, MD  senna-docusate (SENOKOT-S) 8.6-50 MG tablet Take 1 tablet by mouth at bedtime as needed for mild constipation. 07/29/20   Regalado, Belkys A, MD  sevelamer carbonate (RENVELA) 800 MG tablet Take 800 mg by mouth 3 (three) times daily. 06/16/20   [provider]  torsemide (DEMADEX) 20 MG tablet Take 20 mg by mouth daily. 06/16/20   [provider]  vitamin B-12 (CYANOCOBALAMIN) 1000 MCG tablet Take 2,000 mcg by mouth daily.    [provider]  vitamin E 180 MG (400 UNITS) capsule Take 400 Units by mouth daily.    [provider]    Allergies    Patient has no known allergies.  Review of Systems   Review of Systems  Constitutional:  Negative for chills and fever.  HENT:  Negative for ear pain and sore throat.   Eyes:  Negative for pain and visual disturbance.  Respiratory:  Positive for cough and shortness of breath.   Cardiovascular:  Negative for chest pain and palpitations.  Gastrointestinal:  Negative for abdominal pain and vomiting.  Genitourinary:  Negative for  dysuria and hematuria.  Musculoskeletal:  Negative for arthralgias and back pain.  Skin:  Negative for color change and rash.  Neurological:  Negative for seizures and syncope.  All other systems reviewed and are negative.  Physical Exam Updated Vital Signs BP (!) 118/116   Pulse 87   Temp 98.5 F (36.9 C) (Oral)   Resp 18   Ht 5\' 7"  (1.702 m)   Wt 106.4 kg   SpO2 98%   BMI 36.74 kg/m   Physical Exam Vitals and nursing note reviewed.  Constitutional:      General: She is not in acute distress.    Appearance: She is well-developed.  HENT:     Head: Normocephalic and atraumatic.  Eyes:  Conjunctiva/sclera: Conjunctivae normal.  Cardiovascular:     Rate and Rhythm: Normal rate and regular rhythm.     Heart sounds: No murmur heard. Pulmonary:     Effort: Pulmonary effort is normal. No respiratory distress.     Breath sounds: Rales present.  Abdominal:     Palpations: Abdomen is soft.     Tenderness: There is no abdominal tenderness.  Musculoskeletal:        General: No swelling.     Cervical back: Neck supple.     Right lower leg: Edema present.     Left lower leg: Edema present.  Skin:    General: Skin is warm and dry.     Capillary Refill: Capillary refill takes less than 2 seconds.  Neurological:     Mental Status: She is alert.  Psychiatric:        Mood and Affect: Mood normal.    ED Results / Procedures / Treatments   Labs (all labs ordered are listed, but only abnormal results are displayed) Labs Reviewed  BASIC METABOLIC PANEL - Abnormal; Notable for the following components:      Result Value   BUN 26 (*)    Creatinine, Ser 5.11 (*)    Calcium 8.6 (*)    GFR, Estimated 9 (*)    All other components within normal limits  CBC WITH DIFFERENTIAL/PLATELET - Abnormal; Notable for the following components:   RBC 3.66 (*)    Hemoglobin 10.1 (*)    HCT 34.8 (*)    MCHC 29.0 (*)    RDW 16.6 (*)    Lymphs Abs 0.6 (*)    All other components within  normal limits  BRAIN NATRIURETIC PEPTIDE - Abnormal; Notable for the following components:   B Natriuretic Peptide 1,659.9 (*)    All other components within normal limits  CBG MONITORING, ED  TROPONIN I (HIGH SENSITIVITY)  TROPONIN I (HIGH SENSITIVITY)    EKG None  Radiology CT Angio Chest PE W and/or Wo Contrast  Result Date: 05/02/2021 CLINICAL DATA:  Cough. Congestion. Shortness of breath for few days. On dialysis. EXAM: CT ANGIOGRAPHY CHEST WITH CONTRAST TECHNIQUE: Multidetector CT imaging of the chest was performed using the standard protocol during bolus administration of intravenous contrast. Multiplanar CT image reconstructions and MIPs were obtained to evaluate the vascular anatomy. CONTRAST:  2mL OMNIPAQUE IOHEXOL 350 MG/ML SOLN 80 cc Omnipaque 350 COMPARISON:  Plain film earlier today.  Chest CT 12/26/2018. FINDINGS: Cardiovascular: The quality of this exam for evaluation of pulmonary embolism is moderate. Although the bolus is well timed, the exam is generally of decreased sensitivity secondary to patient body habitus and arm position, not raised above the head. No pulmonary embolism to the large segmental level. Aortic atherosclerosis. Tortuous thoracic aorta. Moderate cardiomegaly, without pericardial effusion. Left main and 3 vessel coronary artery calcification. Pulmonary artery enlargement, outflow tract 3.4 cm Mediastinum/Nodes: Prominent low right jugular nodes, including at 9 mm on 04/04. 11 mm right paratracheal node on 16/4, new. Node within the azygoesophageal recess measures 11 mm on 41/4, newly enlarged. Right hilar node of 1.4 cm is upper normal sized. Lungs/Pleura: Trace left larger than right pleural effusions. Poor inspiratory effort. Patchy areas of ground-glass opacity and smooth septal thickening bilaterally. More dense consolidation in the left lower lobe. Upper Abdomen: Reflux of contrast in the IVC and hepatic veins can be seen with elevated right heart pressures.  Normal imaged portions of the spleen, stomach. Musculoskeletal: No acute osseous abnormality. Review of the  MIP images confirms the above findings. IMPRESSION: 1. Multifactorial degradation, primarily patient body habitus and arm position. No pulmonary embolism to the large segmental level. 2. Septal thickening and scattered ground-glass, likely related to congestive heart failure, especially given bilateral pleural effusions. 3. More focal left lower lobe consolidation, suspicious for concurrent pneumonia. 4. Thoracic and borderline low cervical adenopathy, likely reactive in the setting of heart failure. This could be re-evaluated with chest CT at 6 months if desired. 5. Age advanced coronary artery atherosclerosis. Recommend assessment of coronary risk factors and consideration of medical therapy. 6. Pulmonary artery enlargement suggests pulmonary arterial hypertension. Electronically Signed   By: Abigail Miyamoto M.D.   On: 05/02/2021 13:40   DG Chest Port 1 View  Result Date: 05/02/2021 CLINICAL DATA:  Dyspnea, respiratory distress in a 60 year old female. EXAM: PORTABLE CHEST 1 VIEW COMPARISON:  December 09, 2020. FINDINGS: EKG leads project over the chest. Cardiomediastinal contours and hilar structures are grossly stable accounting for obscured LEFT and RIGHT heart borders in the setting of interstitial and alveolar opacity which is diffuse but worse at the lung bases mild asymmetry. Heart size enlarged and accentuated by portable technique and AP projection. No lobar level consolidative process though there is obscured contour of the LEFT hemidiaphragm. No visible pneumothorax. On limited assessment there is no acute skeletal process. IMPRESSION: Findings of may represent sequela of marked asymmetric edema. Difficult to exclude the possibility of multifocal pneumonia. Correlate with signs of heart failure and infection. Cardiomegaly as before. Electronically Signed   By: Zetta Bills M.D.   On: 05/02/2021  10:39    Procedures .Critical Care Performed by: Teressa Lower, MD Authorized by: Teressa Lower, MD   Critical care provider statement:    Critical care time (minutes):  30   Critical care was necessary to treat or prevent imminent or life-threatening deterioration of the following conditions:  Respiratory failure   Critical care was time spent personally by me on the following activities:  Development of treatment plan with patient or surrogate, discussions with consultants, evaluation of patient's response to treatment, examination of patient, ordering and review of laboratory studies, ordering and review of radiographic studies, ordering and performing treatments and interventions, pulse oximetry, re-evaluation of patient's condition and review of old charts   Medications Ordered in ED Medications  cefTRIAXone (ROCEPHIN) 1 g in sodium chloride 0.9 % 100 mL IVPB (has no administration in time range)  azithromycin (ZITHROMAX) 500 mg in sodium chloride 0.9 % 250 mL IVPB (has no administration in time range)  iohexol (OMNIPAQUE) 350 MG/ML injection 80 mL (80 mLs Intravenous Contrast Given 05/02/21 1310)    ED Course  I have reviewed the triage vital signs and the nursing notes.  Pertinent labs & imaging results that were available during my care of the patient were reviewed by me and considered in my medical decision making (see chart for details).    MDM Rules/Calculators/A&P                           Patient seen emergency department for evaluation of cough and shortness of breath.  Physical exam reveals rales bilaterally worse on the left.  Laboratory evaluation with a hemoglobin of 10.1, creatinine 5.11, BUN 26.  BNP is elevated to 1659.9, troponin and delta troponin unremarkable.  Patient arrives saturating 73% on home O2, placed on nonrebreather for short amount of time and oxygen saturations recovered.  Transitioned back to 4 L nasal cannula.  With intermittent episodes of  desaturations into the mid 80s but is otherwise hovering in the mid 90s.  Chest x-ray concerning for asymmetric edema.  CT PE obtained in the setting of the patient's hemoptysis with no large PE, likely bilateral pleural effusions and focal left lower lobe consolidation concerning for concurrent pneumonia.  Patient started on ceftriaxone as a throw and will require hospital admission for further diuresis and treatment of her concurrent pneumonia in the setting of increased oxygen requirement.  Patient then admitted. Final Clinical Impression(s) / ED Diagnoses Final diagnoses:  None    Rx / DC Orders ED Discharge Orders     None        Kesa Birky, MD 05/02/21 1356

## 2021-05-02 NOTE — Plan of Care (Signed)

## 2021-05-02 NOTE — ED Notes (Signed)
Called to room 4, SpO2 77% on 3lpm [place], questionable pleth, BBS clear apices, diminished bilat bases, fine rales noted on left.  Placed on 100% NRBM, SpO2 increased into the upper 90's.  No resp distress, congested productive cough for green blood tinged secretion per family.

## 2021-05-02 NOTE — Progress Notes (Addendum)
Patient has arrived from [facility] to [place]. Vitals obtained. Patient came appearing lethargic and difficulty speaking. Spoke with daughter, Tamela Oddi, and states that was her baseline from previous CVA. Patient also in Afib. MD Hal Hope made aware of arrival and condition.

## 2021-05-03 DIAGNOSIS — G4733 Obstructive sleep apnea (adult) (pediatric): Secondary | ICD-10-CM

## 2021-05-03 DIAGNOSIS — I10 Essential (primary) hypertension: Secondary | ICD-10-CM

## 2021-05-03 DIAGNOSIS — N186 End stage renal disease: Secondary | ICD-10-CM

## 2021-05-03 DIAGNOSIS — Z992 Dependence on renal dialysis: Secondary | ICD-10-CM

## 2021-05-03 DIAGNOSIS — U071 COVID-19: Secondary | ICD-10-CM

## 2021-05-03 DIAGNOSIS — E785 Hyperlipidemia, unspecified: Secondary | ICD-10-CM

## 2021-05-03 DIAGNOSIS — J1282 Pneumonia due to coronavirus disease 2019: Secondary | ICD-10-CM

## 2021-05-03 DIAGNOSIS — E782 Mixed hyperlipidemia: Secondary | ICD-10-CM

## 2021-05-03 DIAGNOSIS — R7989 Other specified abnormal findings of blood chemistry: Secondary | ICD-10-CM

## 2021-05-03 DIAGNOSIS — J984 Other disorders of lung: Secondary | ICD-10-CM

## 2021-05-03 DIAGNOSIS — J9621 Acute and chronic respiratory failure with hypoxia: Secondary | ICD-10-CM

## 2021-05-03 DIAGNOSIS — I482 Chronic atrial fibrillation, unspecified: Secondary | ICD-10-CM

## 2021-05-03 DIAGNOSIS — Z8673 Personal history of transient ischemic attack (TIA), and cerebral infarction without residual deficits: Secondary | ICD-10-CM

## 2021-05-03 DIAGNOSIS — J189 Pneumonia, unspecified organism: Secondary | ICD-10-CM

## 2021-05-03 LAB — CBC WITH DIFFERENTIAL/PLATELET
Abs Immature Granulocytes: 0.07 10*3/uL (ref 0.00–0.07)
Basophils Absolute: 0 10*3/uL (ref 0.0–0.1)
Basophils Relative: 1 %
Eosinophils Absolute: 0.1 10*3/uL (ref 0.0–0.5)
Eosinophils Relative: 2 %
HCT: 32.1 % — ABNORMAL LOW (ref 36.0–46.0)
Hemoglobin: 9.1 g/dL — ABNORMAL LOW (ref 12.0–15.0)
Immature Granulocytes: 1 %
Lymphocytes Relative: 9 %
Lymphs Abs: 0.6 10*3/uL — ABNORMAL LOW (ref 0.7–4.0)
MCH: 27.4 pg (ref 26.0–34.0)
MCHC: 28.3 g/dL — ABNORMAL LOW (ref 30.0–36.0)
MCV: 96.7 fL (ref 80.0–100.0)
Monocytes Absolute: 0.7 10*3/uL (ref 0.1–1.0)
Monocytes Relative: 10 %
Neutro Abs: 5.6 10*3/uL (ref 1.7–7.7)
Neutrophils Relative %: 77 %
Platelets: 178 10*3/uL (ref 150–400)
RBC: 3.32 MIL/uL — ABNORMAL LOW (ref 3.87–5.11)
RDW: 16.8 % — ABNORMAL HIGH (ref 11.5–15.5)
WBC: 7.1 10*3/uL (ref 4.0–10.5)
nRBC: 0 % (ref 0.0–0.2)

## 2021-05-03 LAB — GLUCOSE, CAPILLARY
Glucose-Capillary: 158 mg/dL — ABNORMAL HIGH (ref 70–99)
Glucose-Capillary: 189 mg/dL — ABNORMAL HIGH (ref 70–99)
Glucose-Capillary: 277 mg/dL — ABNORMAL HIGH (ref 70–99)
Glucose-Capillary: 268 mg/dL — ABNORMAL HIGH (ref 70–99)

## 2021-05-03 LAB — HEPATITIS B SURFACE ANTIGEN: Hepatitis B Surface Ag: NONREACTIVE

## 2021-05-03 LAB — FERRITIN: Ferritin: 382 ng/mL — ABNORMAL HIGH (ref 11–307)

## 2021-05-03 LAB — PROCALCITONIN: Procalcitonin: 2.54 ng/mL

## 2021-05-03 LAB — HEPATITIS B SURFACE ANTIBODY,QUALITATIVE: Hep B S Ab: REACTIVE — AB

## 2021-05-03 LAB — MAGNESIUM: Magnesium: 1.9 mg/dL (ref 1.7–2.4)

## 2021-05-03 LAB — D-DIMER, QUANTITATIVE: D-Dimer, Quant: 0.74 ug/mL-FEU — ABNORMAL HIGH (ref 0.00–0.50)

## 2021-05-03 LAB — C-REACTIVE PROTEIN: CRP: 9.5 mg/dL — ABNORMAL HIGH (ref <1.0)

## 2021-05-03 LAB — PHOSPHORUS: Phosphorus: 4.1 mg/dL (ref 2.5–4.6)

## 2021-05-03 MED ORDER — ATORVASTATIN CALCIUM 80 MG PO TABS
80.0000 mg | ORAL_TABLET | Freq: Every evening | ORAL | Status: DC
  Administered 2021-05-03: 80 mg via ORAL
  Filled 2021-05-03: qty 1

## 2021-05-03 MED ORDER — VITAMIN E 45 MG (100 UNIT) PO CAPS
400.0000 [IU] | ORAL_CAPSULE | Freq: Every day | ORAL | Status: DC
  Administered 2021-05-03 – 2021-05-04 (×2): 400 [IU] via ORAL
  Filled 2021-05-03 (×2): qty 4

## 2021-05-03 MED ORDER — APIXABAN 5 MG PO TABS
5.0000 mg | ORAL_TABLET | Freq: Two times a day (BID) | ORAL | Status: DC
  Administered 2021-05-03 – 2021-05-04 (×3): 5 mg via ORAL
  Filled 2021-05-03 (×3): qty 1

## 2021-05-03 MED ORDER — GUAIFENESIN-DM 100-10 MG/5ML PO SYRP: 10.0000 mL | ORAL_SOLUTION | ORAL | Status: DC | PRN

## 2021-05-03 MED ORDER — VITAMIN D 25 MCG (1000 UNIT) PO TABS
1000.0000 [IU] | ORAL_TABLET | Freq: Every day | ORAL | Status: DC
  Administered 2021-05-03 – 2021-05-04 (×2): 1000 [IU] via ORAL
  Filled 2021-05-03 (×2): qty 1

## 2021-05-03 MED ORDER — PREDNISONE 50 MG PO TABS: 50.0000 mg | ORAL_TABLET | Freq: Every day | ORAL | Status: DC

## 2021-05-03 MED ORDER — SODIUM CHLORIDE 0.9 % IV SOLN: 100.0000 mL | INTRAVENOUS | Status: DC | PRN

## 2021-05-03 MED ORDER — PENTAFLUOROPROP-TETRAFLUOROETH EX AERO: 1.0000 "application " | INHALATION_SPRAY | CUTANEOUS | Status: DC | PRN

## 2021-05-03 MED ORDER — AMITRIPTYLINE HCL 25 MG PO TABS
25.0000 mg | ORAL_TABLET | Freq: Every day | ORAL | Status: DC
  Administered 2021-05-03: 25 mg via ORAL
  Filled 2021-05-03 (×2): qty 1

## 2021-05-03 MED ORDER — PANTOPRAZOLE SODIUM 40 MG PO TBEC
40.0000 mg | DELAYED_RELEASE_TABLET | Freq: Every day | ORAL | Status: DC
  Administered 2021-05-03 – 2021-05-04 (×2): 40 mg via ORAL
  Filled 2021-05-03 (×2): qty 1

## 2021-05-03 MED ORDER — LOSARTAN POTASSIUM 50 MG PO TABS
50.0000 mg | ORAL_TABLET | Freq: Every day | ORAL | Status: DC
  Administered 2021-05-03 – 2021-05-04 (×2): 50 mg via ORAL
  Filled 2021-05-03 (×2): qty 1

## 2021-05-03 MED ORDER — INSULIN DETEMIR 100 UNIT/ML ~~LOC~~ SOLN
10.0000 [IU] | Freq: Two times a day (BID) | SUBCUTANEOUS | Status: DC
  Administered 2021-05-03 – 2021-05-04 (×2): 10 [IU] via SUBCUTANEOUS
  Filled 2021-05-03 (×3): qty 0.1

## 2021-05-03 MED ORDER — HYDROCOD POLST-CPM POLST ER 10-8 MG/5ML PO SUER: 5.0000 mL | Freq: Two times a day (BID) | ORAL | Status: DC | PRN

## 2021-05-03 MED ORDER — CINACALCET HCL 30 MG PO TABS
60.0000 mg | ORAL_TABLET | Freq: Every day | ORAL | Status: DC
  Administered 2021-05-03 – 2021-05-04 (×2): 60 mg via ORAL
  Filled 2021-05-03 (×2): qty 2

## 2021-05-03 MED ORDER — INSULIN ASPART 100 UNIT/ML IJ SOLN
0.0000 [IU] | Freq: Three times a day (TID) | INTRAMUSCULAR | Status: DC
  Administered 2021-05-03: 2 [IU] via SUBCUTANEOUS
  Administered 2021-05-03: 3 [IU] via SUBCUTANEOUS
  Administered 2021-05-03 – 2021-05-04 (×2): 2 [IU] via SUBCUTANEOUS

## 2021-05-03 MED ORDER — CHLORHEXIDINE GLUCONATE CLOTH 2 % EX PADS
6.0000 | MEDICATED_PAD | Freq: Every day | CUTANEOUS | Status: DC
  Administered 2021-05-03 – 2021-05-04 (×2): 6 via TOPICAL

## 2021-05-03 MED ORDER — FUROSEMIDE 10 MG/ML IJ SOLN
40.0000 mg | Freq: Two times a day (BID) | INTRAMUSCULAR | Status: DC
  Administered 2021-05-03: 40 mg via INTRAVENOUS
  Filled 2021-05-03: qty 4

## 2021-05-03 MED ORDER — LIDOCAINE-PRILOCAINE 2.5-2.5 % EX CREA
1.0000 "application " | TOPICAL_CREAM | CUTANEOUS | Status: DC | PRN
  Filled 2021-05-03: qty 5

## 2021-05-03 MED ORDER — GABAPENTIN 300 MG PO CAPS
300.0000 mg | ORAL_CAPSULE | Freq: Every day | ORAL | Status: DC
  Administered 2021-05-03 – 2021-05-04 (×2): 300 mg via ORAL
  Filled 2021-05-03 (×2): qty 1

## 2021-05-03 MED ORDER — SODIUM CHLORIDE 0.9 % IV SOLN
100.0000 mg | Freq: Every day | INTRAVENOUS | Status: DC
  Administered 2021-05-04: 100 mg via INTRAVENOUS
  Filled 2021-05-03: qty 100

## 2021-05-03 MED ORDER — HEPARIN SODIUM (PORCINE) 1000 UNIT/ML DIALYSIS
1000.0000 [IU] | INTRAMUSCULAR | Status: DC | PRN
  Filled 2021-05-03: qty 1

## 2021-05-03 MED ORDER — PANTOPRAZOLE SODIUM 40 MG PO TBEC
40.0000 mg | DELAYED_RELEASE_TABLET | Freq: Every day | ORAL | Status: DC
Start: 2021-05-03 — End: 2021-05-03

## 2021-05-03 MED ORDER — AMLODIPINE BESYLATE 10 MG PO TABS
10.0000 mg | ORAL_TABLET | Freq: Every day | ORAL | Status: DC
  Administered 2021-05-03 – 2021-05-04 (×2): 10 mg via ORAL
  Filled 2021-05-03 (×2): qty 1

## 2021-05-03 MED ORDER — LIDOCAINE HCL (PF) 1 % IJ SOLN: 5.0000 mL | INTRAMUSCULAR | Status: DC | PRN

## 2021-05-03 MED ORDER — SODIUM CHLORIDE 0.9 % IV SOLN
1.0000 g | INTRAVENOUS | Status: DC
  Administered 2021-05-03 – 2021-05-04 (×2): 1 g via INTRAVENOUS
  Filled 2021-05-03 (×2): qty 10

## 2021-05-03 MED ORDER — DEXTROSE 5 % IV SOLN
250.0000 mg | INTRAVENOUS | Status: DC
  Administered 2021-05-03: 250 mg via INTRAVENOUS
  Filled 2021-05-03 (×3): qty 2.5

## 2021-05-03 MED ORDER — LINAGLIPTIN 5 MG PO TABS
5.0000 mg | ORAL_TABLET | Freq: Every day | ORAL | Status: DC
  Administered 2021-05-03 – 2021-05-04 (×2): 5 mg via ORAL
  Filled 2021-05-03 (×2): qty 1

## 2021-05-03 MED ORDER — SEVELAMER CARBONATE 800 MG PO TABS
800.0000 mg | ORAL_TABLET | Freq: Three times a day (TID) | ORAL | Status: DC
  Administered 2021-05-03 – 2021-05-04 (×2): 800 mg via ORAL
  Filled 2021-05-03 (×2): qty 1

## 2021-05-03 MED ORDER — ALTEPLASE 2 MG IJ SOLR
2.0000 mg | Freq: Once | INTRAMUSCULAR | Status: DC | PRN
  Filled 2021-05-03: qty 2

## 2021-05-03 MED ORDER — ZINC SULFATE 220 (50 ZN) MG PO CAPS
220.0000 mg | ORAL_CAPSULE | Freq: Every day | ORAL | Status: DC
  Administered 2021-05-03 – 2021-05-04 (×2): 220 mg via ORAL
  Filled 2021-05-03 (×2): qty 1

## 2021-05-03 MED ORDER — ALBUTEROL SULFATE HFA 108 (90 BASE) MCG/ACT IN AERS
2.0000 | INHALATION_SPRAY | Freq: Four times a day (QID) | RESPIRATORY_TRACT | Status: DC
  Administered 2021-05-03 – 2021-05-04 (×3): 2 via RESPIRATORY_TRACT
  Filled 2021-05-03: qty 6.7

## 2021-05-03 MED ORDER — LEVOTHYROXINE SODIUM 75 MCG PO TABS
150.0000 ug | ORAL_TABLET | Freq: Every day | ORAL | Status: DC
  Administered 2021-05-04: 150 ug via ORAL
  Filled 2021-05-03: qty 2

## 2021-05-03 MED ORDER — METHYLPREDNISOLONE SODIUM SUCC 125 MG IJ SOLR
105.0000 mg | INTRAMUSCULAR | Status: DC
  Administered 2021-05-03 – 2021-05-04 (×2): 105 mg via INTRAVENOUS
  Filled 2021-05-03 (×2): qty 2

## 2021-05-03 MED ORDER — SODIUM CHLORIDE 0.9 % IV SOLN
200.0000 mg | Freq: Once | INTRAVENOUS | Status: AC
  Administered 2021-05-03: 200 mg via INTRAVENOUS
  Filled 2021-05-03: qty 40

## 2021-05-03 MED ORDER — INSULIN DETEMIR 100 UNIT/ML ~~LOC~~ SOLN
0.0750 [IU]/kg | Freq: Two times a day (BID) | SUBCUTANEOUS | Status: DC
  Administered 2021-05-03: 8 [IU] via SUBCUTANEOUS
  Filled 2021-05-03 (×3): qty 0.08

## 2021-05-03 MED ORDER — LEVOTHYROXINE SODIUM 100 MCG PO TABS: 100.0000 ug | ORAL_TABLET | Freq: Every day | ORAL | Status: DC

## 2021-05-03 MED ORDER — ASCORBIC ACID 500 MG PO TABS
500.0000 mg | ORAL_TABLET | Freq: Every day | ORAL | Status: DC
  Administered 2021-05-03 – 2021-05-04 (×2): 500 mg via ORAL
  Filled 2021-05-03 (×2): qty 1

## 2021-05-03 MED ORDER — INSULIN ASPART 100 UNIT/ML IJ SOLN
2.0000 [IU] | Freq: Three times a day (TID) | INTRAMUSCULAR | Status: DC
  Administered 2021-05-03 – 2021-05-04 (×3): 2 [IU] via SUBCUTANEOUS

## 2021-05-03 MED ORDER — INSULIN ASPART 100 UNIT/ML IJ SOLN
0.0000 [IU] | Freq: Every day | INTRAMUSCULAR | Status: DC
  Administered 2021-05-03: 3 [IU] via SUBCUTANEOUS

## 2021-05-03 NOTE — Plan of Care (Signed)

## 2021-05-03 NOTE — Progress Notes (Signed)
PROGRESS NOTE    [name]  XNT:[id number] DOB: 1960-10-14 DOA: 05/02/2021 PCP: Patient, No Pcp Per (Inactive)   Brief Narrative:  HPI: Rachael Herrera is an obese 60 y.o. female with medical history significant for ESRD on dialysis (TTS), prior CVA, A-Fib on Eliquis,  Essential hypertension, hyperlipidemia, T2DM, OSA on CPAP who presents to [facility] ED due to shortness of breath and increased oxygen need.  Patient was unable to provide a history, history was obtained from ED medical record.  Per report, patient had 2 to 3-day onset of cough, chest congestion, shortness of breath and fatigue.  She was chronically on 3 LPM of oxygen at home and on arrival to the ED O2 sat was 73% on room air.  This was increased to 6 LPM with improved oxygenation.  Cough was productive of rusty brown and bloody sputum.  There was no fever, chills, nausea, vomiting, chest pain or abdominal pain.   ED Course:  In the emergency department, she was intermittently tachypneic and tachycardic.  Work-up in the ED showed normocytic anemia, BUN/creatinine 26/5.11 with eGFR of 9.  BNP 1659.9, troponin x2 was negative.  Influenza A, B was negative.  SARS coronavirus 2 was positive.  O2 sats has since been weaned down to 4 LPM with good saturations. Chest x-ray was suggestive of possible multifocal pneumonia CT angiography chest with contrast showed no pulmonary embolism, but showed septal thickening and scattered groundglass, likely related to CHF, left lower lobe consolidation suspicious for concurrent pneumonia Patient was treated with IV ceftriaxone and azithromycin.  Hospitalist was asked to admit patient for further evaluation and management.  Assessment & Plan:   Principal Problem:   Acute on chronic respiratory failure with hypoxia (HCC) Active Problems:   Type 2 diabetes mellitus (HCC)   Essential hypertension   Obesity   OSA (obstructive sleep apnea)   ESRD on dialysis (HCC)   Elevated brain natriuretic  peptide (BNP) level   Cavitary pneumonia   Pneumonia due to COVID-19 virus   Mixed hyperlipidemia   History of CVA (cerebrovascular accident)   Atrial fibrillation, chronic (HCC)  Acute on chronic respiratory failure possibly secondary to multifactorial including pulmonary edema secondary to acute on chronic congestive heart failure with preserved ejection fraction, COVID-19 pneumonia, community-acquired pneumonia: CT angiography of chest was suggestive of CHF and left lower lobe consolidation suspicious for concurrent pneumonia.  COVID-positive as well.  Normally requires 2 to 3 L of oxygen but currently on 4 L.  Looks very comfortable.  No groundglass opacities on the lungs.  Very close to her baseline need of oxygen.  Troponin negative.  She received 1 dose of IV antibiotics, those were not continued.  Procalcitonin elevated suggesting bacterial component of the pneumonia as well.  We will resume Rocephin and Zithromax.  Continue remdesivir and Solu-Medrol as well as bronchodilators.  Per her daughter, patient does not make much urine, no indication of Lasix, will discontinue that.  She needs dialysis, nephrology on board, she is due and is scheduled later today.  Hopefully that will help with the CHF.  Echo pending.  Try to wean oxygen.  Patient was encouraged to prone, out of bed to chair, to use incentive spirometry and flutter valve.  ESRD on HD (TTS): Nephrology on board, she will get dialysis today.  History of CVA with residual severe dysarthria: Patient able to talk but her speech is incomprehensible and that is her baseline according to the daughter who confirmed. Continue Lipitor, Eliquis  Chronic atrial fibrillation (on Eliquis) Continue Eliquis No rate control medication noted on patient's med rec, we shall await updated med rec  Obesity (BMI 36.22 Kg/m) Patient was counseled on diet and Nancyi medication  Essential hypertension: Controlled. Continue Norvasc, losartan  Type 2  diabetes mellitus: Takes 35 units of Levemir twice daily and Tradjenta at home as well as short acting insulin.  She is on 8 units of Levemir twice daily and Tradjenta as well as SSI and 2 units of 3 times daily Premeal NovoLog.  We will continue all of them but increase Levemir to 10 units since she was hypoglycemic this morning.  Mixed hyperlipidemia Continue Lipitor  OSA on CPAP Continue CPAP  Hypothyroidism Continue Synthroid   DVT prophylaxis: SCDs Start: 05/03/21 0124   Code Status: Full Code  Family Communication: Plan of care discussed with the daughter at the bedside  Status is: Inpatient  Remains inpatient appropriate because: Needs inpatient management of multiple medical problems as mentioned above.  Estimated body mass index is 36.6 kg/m as calculated from the following:   Height as of this encounter: _0  (1.702 m).   Weight as of this encounter: 106 kg.   Nutritional Assessment: Body mass index is 36.6 kg/m.Marland Kitchen Seen by dietician.  I agree with the assessment and plan as outlined below: Nutrition Status:   Skin Assessment: I have examined the patient's skin and I agree with the wound assessment as performed by the wound care RN as outlined below:    Consultants:  Nephrology  Procedures:  None  Antimicrobials:  Anti-infectives (From admission, onward)    Start     Dose/Rate Route Frequency Ordered Stop   05/04/21 1000  remdesivir 100 mg in sodium chloride 0.9 % 100 mL IVPB       See Hyperspace for full Linked Orders Report.   100 mg 200 mL/hr over 30 Minutes Intravenous Daily 05/03/21 0132 05/08/21 0959   05/03/21 1000  cefTRIAXone (ROCEPHIN) 1 g in sodium chloride 0.9 % 100 mL IVPB        1 g 200 mL/hr over 30 Minutes Intravenous Every 24 hours 05/03/21 0835     05/03/21 1000  azithromycin (ZITHROMAX) 250 mg in dextrose 5 % 125 mL IVPB        250 mg 127.5 mL/hr over 60 Minutes Intravenous Every 24 hours 05/03/21 0835     05/03/21 0300  remdesivir  200 mg in sodium chloride 0.9% 250 mL IVPB       See Hyperspace for full Linked Orders Report.   200 mg 580 mL/hr over 30 Minutes Intravenous Once 05/03/21 0132 05/03/21 0344   05/02/21 1400  cefTRIAXone (ROCEPHIN) 1 g in sodium chloride 0.9 % 100 mL IVPB        1 g 200 mL/hr over 30 Minutes Intravenous  Once 05/02/21 1347 05/02/21 1431   05/02/21 1400  azithromycin (ZITHROMAX) 500 mg in sodium chloride 0.9 % 250 mL IVPB        500 mg 250 mL/hr over 60 Minutes Intravenous  Once 05/02/21 1347 05/02/21 1535          Subjective: Seen and examined.  Patient looks very comfortable.  She is trying to talk but her speech is incomprehensible due to previous stroke.  Objective: Vitals:   05/02/21 1910 05/03/21 0027 05/03/21 0400 05/03/21 0826  BP: (!) 117/101 (!) 147/67 121/61 (!) 144/60  Pulse: 91 86 74 77  Resp: _1 Temp: 98.1 F (36.7 C)  98.6 F (37 C) 98.6 F (37 C) 98.3 F (36.8 C)  TempSrc: Oral Oral Oral Oral  SpO2: 97% 97% 96% 94%  Weight: 104.9 kg  106 kg   Height: _0  (1.702 m)       Intake/Output Summary (Last 24 hours) at 05/03/2021 1135 Last data filed at 05/03/2021 0400 Gross per 24 hour  Intake 757.4 ml  Output --  Net 757.4 ml   Filed Weights   05/02/21 0927 05/02/21 1910 05/03/21 0400  Weight: 106.4 kg 104.9 kg 106 kg    Examination:  General exam: Appears calm and comfortable  Respiratory system: Bibasilar crackles. Respiratory effort normal. Cardiovascular system: S1 & S2 heard, irregularly irregular rate and rhythm. No JVD, murmurs, rubs, gallops or clicks. No pedal edema. Gastrointestinal system: Abdomen is nondistended, soft and nontender. No organomegaly or masses felt. Normal bowel sounds heard. Central nervous system: Alert and oriented.  Dysarthria, no other focal deficit. Extremities: Symmetric 5 x 5 power.  Data Reviewed: I have personally reviewed following labs and imaging studies  CBC: Recent Labs  Lab 05/02/21 0947  05/03/21 0316  WBC 5.0 7.1  NEUTROABS 3.6 5.6  HGB 10.1* 9.1*  HCT 34.8* 32.1*  MCV 95.1 96.7  PLT 171 539   Basic Metabolic Panel: Recent Labs  Lab 05/02/21 0947 05/03/21 0316  NA 136  --   K 3.8  --   CL 98  --   CO2 27  --   GLUCOSE 90  --   BUN 26*  --   CREATININE 5.11*  --   CALCIUM 8.6*  --   MG  --  1.9  PHOS  --  4.1   GFR: Estimated Creatinine Clearance: 14.3 mL/min (A) (by C-G formula based on SCr of 5.11 mg/dL (H)). Liver Function Tests: No results for input(s): AST, ALT, ALKPHOS, BILITOT, PROT, ALBUMIN in the last 168 hours. No results for input(s): LIPASE, AMYLASE in the last 168 hours. No results for input(s): AMMONIA in the last 168 hours. Coagulation Profile: No results for input(s): INR, PROTIME in the last 168 hours. Cardiac Enzymes: No results for input(s): CKTOTAL, CKMB, CKMBINDEX, TROPONINI in the last 168 hours. BNP (last 3 results) No results for input(s): PROBNP in the last 8760 hours. HbA1C: No results for input(s): HGBA1C in the last 72 hours. CBG: Recent Labs  Lab 05/02/21 1242 05/03/21 0611  GLUCAP 86 158*   Lipid Profile: No results for input(s): CHOL, HDL, LDLCALC, TRIG, CHOLHDL, LDLDIRECT in the last 72 hours. Thyroid Function Tests: No results for input(s): TSH, T4TOTAL, FREET4, T3FREE, THYROIDAB in the last 72 hours. Anemia Panel: Recent Labs    05/03/21 0316  FERRITIN 382*   Sepsis Labs: Recent Labs  Lab 05/03/21 0316  PROCALCITON 2.54    Recent Results (from the past 240 hour(s))  Resp Panel by RT-PCR (Flu A&B, Covid) Nasopharyngeal Swab     Status: Abnormal   Collection Time: 05/02/21  4:35 PM   Specimen: Nasopharyngeal Swab; Nasopharyngeal(NP) swabs in vial transport medium  Result Value Ref Range Status   SARS Coronavirus 2 by RT PCR POSITIVE (A) NEGATIVE Final    Comment: RESULT CALLED TO, READ BACK BY AND VERIFIED WITH:  COBLE,S RN _1  05/02/21 EDENSCA (NOTE) SARS-CoV-2 target nucleic acids are  DETECTED.  The SARS-CoV-2 RNA is generally detectable in upper respiratory specimens during the acute phase of infection. Positive results are indicative of the presence of the identified virus, but do not rule out bacterial infection or co-infection  with other pathogens not detected by the test. Clinical correlation with patient history and other diagnostic information is necessary to determine patient infection status. The expected result is Negative.  Fact Sheet for Patients: EntrepreneurPulse.com.au  Fact Sheet for Healthcare Providers: IncredibleEmployment.be  This test is not yet approved or cleared by the [place] FDA and  has been authorized for detection and/or diagnosis of SARS-CoV-2 by FDA under an Emergency Use Authorization (EUA).  This EUA will remain in effect (meaning this test can be  used) for the duration of  the COVID-19 declaration under Section 564(b)(1) of the Act, 21 U.S.C. section 360bbb-3(b)(1), unless the authorization is terminated or revoked sooner.     Influenza A by PCR NEGATIVE NEGATIVE Final   Influenza B by PCR NEGATIVE NEGATIVE Final    Comment: (NOTE) The Xpert Xpress SARS-CoV-2/FLU/RSV plus assay is intended as an aid in the diagnosis of influenza from Nasopharyngeal swab specimens and should not be used as a sole basis for treatment. Nasal washings and aspirates are unacceptable for Xpert Xpress SARS-CoV-2/FLU/RSV testing.  Fact Sheet for Patients: EntrepreneurPulse.com.au  Fact Sheet for Healthcare Providers: IncredibleEmployment.be  This test is not yet approved or cleared by the [place] FDA and has been authorized for detection and/or diagnosis of SARS-CoV-2 by FDA under an Emergency Use Authorization (EUA). This EUA will remain in effect (meaning this test can be used) for the duration of the COVID-19 declaration under Section 564(b)(1) of the Act, 21  U.S.C. section 360bbb-3(b)(1), unless the authorization is terminated or revoked.  Performed at [facility], [address] [place], [place] [address]       Radiology Studies: CT Angio Chest PE W and/or Wo Contrast  Result Date: 05/02/2021 CLINICAL DATA:  Cough. Congestion. Shortness of breath for few days. On dialysis. EXAM: CT ANGIOGRAPHY CHEST WITH CONTRAST TECHNIQUE: Multidetector CT imaging of the chest was performed using the standard protocol during bolus administration of intravenous contrast. Multiplanar CT image reconstructions and MIPs were obtained to evaluate the vascular anatomy. CONTRAST:  68m OMNIPAQUE IOHEXOL 350 MG/ML SOLN 80 cc Omnipaque 350 COMPARISON:  Plain film earlier today.  Chest CT 12/26/2018. FINDINGS: Cardiovascular: The quality of this exam for evaluation of pulmonary embolism is moderate. Although the bolus is well timed, the exam is generally of decreased sensitivity secondary to patient body habitus and arm position, not raised above the head. No pulmonary embolism to the large segmental level. Aortic atherosclerosis. Tortuous thoracic aorta. Moderate cardiomegaly, without pericardial effusion. Left main and 3 vessel coronary artery calcification. Pulmonary artery enlargement, outflow tract 3.4 cm Mediastinum/Nodes: Prominent low right jugular nodes, including at 9 mm on 04/04. 11 mm right paratracheal node on 16/4, new. Node within the azygoesophageal recess measures 11 mm on 41/4, newly enlarged. Right hilar node of 1.4 cm is upper normal sized. Lungs/Pleura: Trace left larger than right pleural effusions. Poor inspiratory effort. Patchy areas of ground-glass opacity and smooth septal thickening bilaterally. More dense consolidation in the left lower lobe. Upper Abdomen: Reflux of contrast in the IVC and hepatic veins can be seen with elevated right heart pressures. Normal imaged portions of the spleen, stomach. Musculoskeletal: No acute osseous  abnormality. Review of the MIP images confirms the above findings. IMPRESSION: 1. Multifactorial degradation, primarily patient body habitus and arm position. No pulmonary embolism to the large segmental level. 2. Septal thickening and scattered ground-glass, likely related to congestive heart failure, especially given bilateral pleural effusions. 3. More focal left lower lobe consolidation,  suspicious for concurrent pneumonia. 4. Thoracic and borderline low cervical adenopathy, likely reactive in the setting of heart failure. This could be re-evaluated with chest CT at 6 months if desired. 5. Age advanced coronary artery atherosclerosis. Recommend assessment of coronary risk factors and consideration of medical therapy. 6. Pulmonary artery enlargement suggests pulmonary arterial hypertension. Electronically Signed   By: Abigail Miyamoto M.D.   On: 05/02/2021 13:40   DG Chest Port 1 View  Result Date: 05/02/2021 CLINICAL DATA:  Dyspnea, respiratory distress in a 60 year old female. EXAM: PORTABLE CHEST 1 VIEW COMPARISON:  December 09, 2020. FINDINGS: EKG leads project over the chest. Cardiomediastinal contours and hilar structures are grossly stable accounting for obscured LEFT and RIGHT heart borders in the setting of interstitial and alveolar opacity which is diffuse but worse at the lung bases mild asymmetry. Heart size enlarged and accentuated by portable technique and AP projection. No lobar level consolidative process though there is obscured contour of the LEFT hemidiaphragm. No visible pneumothorax. On limited assessment there is no acute skeletal process. IMPRESSION: Findings of may represent sequela of marked asymmetric edema. Difficult to exclude the possibility of multifocal pneumonia. Correlate with signs of heart failure and infection. Cardiomegaly as before. Electronically Signed   By: Zetta Bills M.D.   On: 05/02/2021 10:39    Scheduled Meds:  albuterol  2 puff Inhalation Q6H   amLODipine  10 mg  Oral Daily   apixaban  5 mg Oral BID   vitamin C  500 mg Oral Daily   atorvastatin  80 mg Oral QPM   Chlorhexidine Gluconate Cloth  6 each Topical Q0600   cinacalcet  60 mg Oral Q lunch   insulin aspart  0-5 Units Subcutaneous QHS   insulin aspart  0-9 Units Subcutaneous TID WC   insulin aspart  2 Units Subcutaneous TID WC   insulin detemir  0.075 Units/kg Subcutaneous BID   [START ON 05/04/2021] levothyroxine  100 mcg Oral Q0600   linagliptin  5 mg Oral Daily   losartan  50 mg Oral Daily   methylPREDNISolone (SOLU-MEDROL) injection  105 mg Intravenous Q24H   Followed by   Derrill Memo ON 05/06/2021] predniSONE  50 mg Oral Daily   pantoprazole  40 mg Oral Daily   sevelamer carbonate  800 mg Oral TID with meals   zinc sulfate  220 mg Oral Daily   Continuous Infusions:  sodium chloride     sodium chloride     azithromycin     cefTRIAXone (ROCEPHIN)  IV 1 g (05/03/21 1012)   [START ON 05/04/2021] remdesivir 100 mg in NS 100 mL       LOS: 1 day   Time spent: 69 min    Darliss Cheney, MD [facility]  05/03/2021, 11:35 AM  Please page via Shea [facility] and do not message via secure chat for anything urgent. Secure chat can be used for anything non urgent.  How to contact the [facility] Attending or Consulting provider [place] or covering provider during after hours [place], for this patient?  Check the care team in [facility] and look for a) attending/consulting [facility] provider listed and b) the [facility] team listed. Page or secure chat [phone]. Log into [email or web address] and use [place]'s universal password to access. If you do not have the password, please contact the hospital operator. Locate the [facility] provider you are looking for under [facility] and page to a number that you can be directly reached. If you still have difficulty reaching  the provider, please page the [facility] (Director on Call) for the Hospitalists listed on amion for assistance.

## 2021-05-03 NOTE — Consult Note (Addendum)
[facility] Renal Consultation Note    Indication for Consultation:  Management of ESRD/hemodialysis; anemia, hypertension/volume and secondary hyperparathyroidism  PRF:[id number], No Pcp Per (Inactive)  HPI: Rachael Herrera is a 60 y.o. female with ESRD on HD TTS at [facility].  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    Patient seen and examined at bedside with no family present.  History obtained through chart review due to patient aphasia.  Presented to the ED yesterday due to cough, chest congestion, shortness of breath and fatigue starting 3 days ago.  Initially presented to [facility] ED hypoxic on room air, which improved with 6L O2.  Productive cough with rusty brown sputum noted.  Patient currently denies fever, chills, CP, SOB, abdominal pain, and n/v/d.   Pertinent findings during work up include +COVID-19,  hypoxia with increased O2 requirement (currently 100% on 4L), CXR with PNA vs asymmetrical pulmonary edema and CTA chest with likely pulmonary edema and PNA, as well as pulmonary HTN, b/l pleural effusions and no evidence of PE.  Patient has been admitted for further evaluation and management.    Past Medical History:  Diagnosis Date   Cavitary pneumonia 06/2017   due to MSSA   Claudication of both lower extremities ([place])    DDD (degenerative disc disease), cervical    DDD (degenerative disc disease), lumbosacral    Diabetes mellitus ([place])    Diabetic neuropathy ([place])    ESRD (end stage renal disease) on dialysis ([place])    GERD (gastroesophageal reflux disease)    NSVT (nonsustained ventricular tachycardia) 10/20201   OSA on CPAP    Stroke ([facility])    Ulnar neuropathy    BUE   Vitamin B 12 deficiency    Past Surgical History:  Procedure Laterality Date   IR ANGIO VERTEBRAL SEL VERTEBRAL UNI L MOD SED  07/11/2020    IR CT HEAD LTD  07/11/2020   IR PERCUTANEOUS ART THROMBECTOMY/INFUSION INTRACRANIAL INC DIAG ANGIO  07/11/2020   IR US GUIDE VASC ACCESS LEFT  07/12/2020   RADIOLOGY WITH ANESTHESIA N/A 07/11/2020   Procedure: IR WITH ANESTHESIA;  Surgeon: Radiologist, Medication, MD;  Location: [facility];  Service: Radiology;  Laterality: N/A;   Family History  Problem Relation Age of Onset   Diabetes Mother    Heart disease Mother    Diabetes Father    Heart disease Father    Diabetes Sister    Social History:  reports that she has never smoked. She has never used smokeless tobacco. She reports that she does not currently use alcohol. She reports that she does not currently use drugs. No Known Allergies Prior to Admission medications   Medication Sig Start Date End Date Taking? Authorizing Provider  amitriptyline (ELAVIL) 25 MG tablet Take 25 mg by mouth at bedtime. 06/16/20  Yes [provider]  amLODipine (NORVASC) 10 MG tablet Take 10 mg by mouth daily. 06/16/20  Yes [provider]  apixaban (ELIQUIS) 5 MG TABS tablet Take 1 tablet (5 mg total) by mouth 2 (two) times daily. 07/29/20  Yes Regalado, Belkys A, MD  atorvastatin (LIPITOR) 80 MG tablet Take 80 mg by mouth every evening. 04/06/19  Yes [provider]  cholecalciferol (VITAMIN D3) 25 MCG (1000 UNIT) tablet Take 1,000 Units by mouth daily.   Yes [provider]  cinacalcet (SENSIPAR) 30 MG  tablet Take 30 mg by mouth every other day.   Yes [provider]  diclofenac Sodium (VOLTAREN) 1 % GEL Apply 2 g topically 4 (four) times daily. Apply to knee 04/22/21  Yes [provider]  gabapentin (NEURONTIN) 300 MG capsule Take 300 mg by mouth daily. 04/30/14  Yes [provider]  insulin glargine (LANTUS SOLOSTAR) 100 UNIT/ML Solostar Pen Inject 30 Units into the skin daily. 12/19/20  Yes [provider]  insulin lispro (HUMALOG) 100 UNIT/ML injection Inject into the skin 3 (three) times  daily before meals. Per sliding scale   Yes [provider]  levothyroxine (SYNTHROID) 100 MCG tablet Take 1 tablet (100 mcg total) by mouth daily at 6 (six) AM. 07/30/20  Yes Regalado, Belkys A, MD  losartan (COZAAR) 50 MG tablet Take 50 mg by mouth daily. 06/16/20  Yes [provider]  sevelamer carbonate (RENVELA) 800 MG tablet Take 800 mg by mouth 3 (three) times daily. 06/16/20  Yes [provider]  torsemide (DEMADEX) 20 MG tablet Take 20 mg by mouth daily. 06/16/20  Yes [provider]  vitamin E 180 MG (400 UNITS) capsule Take 400 Units by mouth daily.   Yes [provider]  insulin aspart (NOVOLOG) 100 UNIT/ML injection Inject 2 Units into the skin 3 (three) times daily with meals. Patient not taking: Reported on 05/03/2021 07/29/20   Regalado, Jerald Kief A, MD  insulin glargine (LANTUS) 100 UNIT/ML injection Inject 0.35 mLs (35 Units total) into the skin 2 (two) times daily. Patient not taking: Reported on 05/03/2021 07/29/20   Regalado, Jerald Kief A, MD  polyethylene glycol (MIRALAX / GLYCOLAX) 17 g packet Take 17 g by mouth daily. Patient not taking: Reported on 05/03/2021 07/30/20   Regalado, Jerald Kief A, MD  senna-docusate (SENOKOT-S) 8.6-50 MG tablet Take 1 tablet by mouth at bedtime as needed for mild constipation. Patient not taking: Reported on 05/03/2021 07/29/20   Elmarie Shiley, MD   Current Facility-Administered Medications  Medication Dose Route Frequency Provider Last Rate Last Admin   0.9 %  sodium chloride infusion  100 mL Intravenous PRN Penninger, Ria Comment, PA       0.9 %  sodium chloride infusion  100 mL Intravenous PRN Penninger, Ria Comment, PA       albuterol (VENTOLIN HFA) 108 (90 Base) MCG/ACT inhaler 2 puff  2 puff Inhalation Q6H Adefeso, Oladapo, DO       alteplase (CATHFLO ACTIVASE) injection 2 mg  2 mg Intracatheter Once PRN Penninger, Ria Comment, PA       amLODipine (NORVASC) tablet 10 mg  10 mg Oral Daily Adefeso, Oladapo, DO        apixaban (ELIQUIS) tablet 5 mg  5 mg Oral BID Adefeso, Oladapo, DO       ascorbic acid (VITAMIN C) tablet 500 mg  500 mg Oral Daily Adefeso, Oladapo, DO       atorvastatin (LIPITOR) tablet 80 mg  80 mg Oral QPM Adefeso, Oladapo, DO       azithromycin (ZITHROMAX) 250 mg in dextrose 5 % 125 mL IVPB  250 mg Intravenous Q24H Pahwani, Ravi, MD       cefTRIAXone (ROCEPHIN) 1 g in sodium chloride 0.9 % 100 mL IVPB  1 g Intravenous Q24H Pahwani, Ravi, MD       Chlorhexidine Gluconate Cloth 2 % PADS 6 each  6 each Topical Q0600 Penninger, Lindsay, PA       chlorpheniramine-HYDROcodone (TUSSIONEX) 10-8 MG/5ML suspension 5 mL  5 mL Oral Q12H  PRN Bernadette Hoit, DO       cinacalcet (SENSIPAR) tablet 60 mg  60 mg Oral Q lunch Adefeso, Oladapo, DO       furosemide (LASIX) injection 40 mg  40 mg Intravenous Q12H Adefeso, Oladapo, DO   40 mg at 05/03/21 0311   guaiFENesin-dextromethorphan (ROBITUSSIN DM) 100-10 MG/5ML syrup 10 mL  10 mL Oral Q4H PRN Adefeso, Oladapo, DO       heparin injection 1,000 Units  1,000 Units Dialysis PRN Penninger, Ria Comment, PA       insulin aspart (novoLOG) injection 0-5 Units  0-5 Units Subcutaneous QHS Adefeso, Oladapo, DO       insulin aspart (novoLOG) injection 0-9 Units  0-9 Units Subcutaneous TID WC Adefeso, Oladapo, DO   2 Units at 05/03/21 1027   insulin aspart (novoLOG) injection 2 Units  2 Units Subcutaneous TID WC Adefeso, Oladapo, DO       insulin detemir (LEVEMIR) injection 8 Units  0.075 Units/kg Subcutaneous BID Adefeso, Oladapo, DO       [START ON 05/04/2021] levothyroxine (SYNTHROID) tablet 100 mcg  100 mcg Oral Q0600 Adefeso, Oladapo, DO       lidocaine (PF) (XYLOCAINE) 1 % injection 5 mL  5 mL Intradermal PRN Penninger, Ria Comment, PA       lidocaine-prilocaine (EMLA) cream 1 application  1 application Topical PRN Penninger, Ria Comment, PA       linagliptin (TRADJENTA) tablet 5 mg  5 mg Oral Daily Adefeso, Oladapo, DO       losartan (COZAAR) tablet 50 mg  50 mg Oral Daily  Adefeso, Oladapo, DO       methylPREDNISolone sodium succinate (SOLU-MEDROL) 125 mg/2 mL injection 105 mg  105 mg Intravenous Q24H Adefeso, Oladapo, DO   105 mg at 05/03/21 2536   Followed by   Derrill Memo ON 05/06/2021] predniSONE (DELTASONE) tablet 50 mg  50 mg Oral Daily Adefeso, Oladapo, DO       pantoprazole (PROTONIX) EC tablet 40 mg  40 mg Oral Daily Adefeso, Oladapo, DO       pentafluoroprop-tetrafluoroeth (GEBAUERS) aerosol 1 application  1 application Topical PRN Penninger, [place], [place]       [START ON 05/04/2021] remdesivir 100 mg in sodium chloride 0.9 % 100 mL IVPB  100 mg Intravenous Daily Adefeso, Oladapo, DO       sevelamer carbonate (RENVELA) tablet 800 mg  800 mg Oral TID with meals Adefeso, Oladapo, DO       zinc sulfate capsule 220 mg  220 mg Oral Daily Adefeso, Tobe Sos, DO       Labs: Basic Metabolic Panel: Recent Labs  Lab 05/02/21 0947 05/03/21 0316  NA 136  --   K 3.8  --   CL 98  --   CO2 27  --   GLUCOSE 90  --   BUN 26*  --   CREATININE 5.11*  --   CALCIUM 8.6*  --   PHOS  --  4.1   CBC: Recent Labs  Lab 05/02/21 0947 05/03/21 0316  WBC 5.0 7.1  NEUTROABS 3.6 5.6  HGB 10.1* 9.1*  HCT 34.8* 32.1*  MCV 95.1 96.7  PLT 171 178    CBG: Recent Labs  Lab 05/02/21 1242 05/03/21 0611  GLUCAP 86 158*   Iron Studies:  Recent Labs    05/03/21 0316  FERRITIN 382*   Studies/Results: CT Angio Chest PE W and/or Wo Contrast  Result Date: 05/02/2021 CLINICAL DATA:  Cough. Congestion. Shortness of breath for few days. On  dialysis. EXAM: CT ANGIOGRAPHY CHEST WITH CONTRAST TECHNIQUE: Multidetector CT imaging of the chest was performed using the standard protocol during bolus administration of intravenous contrast. Multiplanar CT image reconstructions and MIPs were obtained to evaluate the vascular anatomy. CONTRAST:  20mL OMNIPAQUE IOHEXOL 350 MG/ML SOLN 80 cc Omnipaque 350 COMPARISON:  Plain film earlier today.  Chest CT 12/26/2018. FINDINGS: Cardiovascular:  The quality of this exam for evaluation of pulmonary embolism is moderate. Although the bolus is well timed, the exam is generally of decreased sensitivity secondary to patient body habitus and arm position, not raised above the head. No pulmonary embolism to the large segmental level. Aortic atherosclerosis. Tortuous thoracic aorta. Moderate cardiomegaly, without pericardial effusion. Left main and 3 vessel coronary artery calcification. Pulmonary artery enlargement, outflow tract 3.4 cm Mediastinum/Nodes: Prominent low right jugular nodes, including at 9 mm on 04/04. 11 mm right paratracheal node on 16/4, new. Node within the azygoesophageal recess measures 11 mm on 41/4, newly enlarged. Right hilar node of 1.4 cm is upper normal sized. Lungs/Pleura: Trace left larger than right pleural effusions. Poor inspiratory effort. Patchy areas of ground-glass opacity and smooth septal thickening bilaterally. More dense consolidation in the left lower lobe. Upper Abdomen: Reflux of contrast in the IVC and hepatic veins can be seen with elevated right heart pressures. Normal imaged portions of the spleen, stomach. Musculoskeletal: No acute osseous abnormality. Review of the MIP images confirms the above findings. IMPRESSION: 1. Multifactorial degradation, primarily patient body habitus and arm position. No pulmonary embolism to the large segmental level. 2. Septal thickening and scattered ground-glass, likely related to congestive heart failure, especially given bilateral pleural effusions. 3. More focal left lower lobe consolidation, suspicious for concurrent pneumonia. 4. Thoracic and borderline low cervical adenopathy, likely reactive in the setting of heart failure. This could be re-evaluated with chest CT at 6 months if desired. 5. Age advanced coronary artery atherosclerosis. Recommend assessment of coronary risk factors and consideration of medical therapy. 6. Pulmonary artery enlargement suggests pulmonary arterial  hypertension. Electronically Signed   By: Abigail Miyamoto M.D.   On: 05/02/2021 13:40   DG Chest Port 1 View  Result Date: 05/02/2021 CLINICAL DATA:  Dyspnea, respiratory distress in a 60 year old female. EXAM: PORTABLE CHEST 1 VIEW COMPARISON:  December 09, 2020. FINDINGS: EKG leads project over the chest. Cardiomediastinal contours and hilar structures are grossly stable accounting for obscured LEFT and RIGHT heart borders in the setting of interstitial and alveolar opacity which is diffuse but worse at the lung bases mild asymmetry. Heart size enlarged and accentuated by portable technique and AP projection. No lobar level consolidative process though there is obscured contour of the LEFT hemidiaphragm. No visible pneumothorax. On limited assessment there is no acute skeletal process. IMPRESSION: Findings of may represent sequela of marked asymmetric edema. Difficult to exclude the possibility of multifocal pneumonia. Correlate with signs of heart failure and infection. Cardiomegaly as before. Electronically Signed   By: Zetta Bills M.D.   On: 05/02/2021 10:39    ROS: Unable to complete full ROS due to aphasia   Physical Exam: Vitals:   05/02/21 1910 05/03/21 0027 05/03/21 0400 05/03/21 0826  BP: (!) 117/101 (!) 147/67 121/61 (!) 144/60  Pulse: 91 86 74 77  Resp: 19  19 14   Temp: 98.1 F (36.7 C) 98.6 F (37 C) 98.6 F (37 C) 98.3 F (36.8 C)  TempSrc: Oral Oral Oral Oral  SpO2: 97% 97% 96% 94%  Weight: 104.9 kg  106 kg  Height: 5\' 7"  (1.702 m)        General: chronically ill appearing female in NAD Head: NCAT sclera not icteric MMM Neck: Supple. No lymphadenopathy Lungs: Breath sounds decreased, no crackles noted Heart: IRIR. No murmur, rubs or gallops.  Abdomen: soft, nontender, +BS, no guarding, no rebound tenderness Lower extremities:1+ edema bilaterally Neuro: +lethargic, +aphasia. Moves all extremities spontaneously. Dialysis Access: TDC, LU AVF +b/t  Dialysis Orders:  TTS -  [facility]  3.5hrs, BFR 350, DFR 700,  EDW 273kg, 2K/ 3Ca  Access: TDC  Heparin standard Sensipar 30mg  qd  Assessment/Plan:  COVID and superimposed CAP - CTA with LLL consolidation suspicious for PNA.  Started on ceftriaxone and azithromycin.  Per primary Acute on chronic respiratory failure - 2/2 #1 and likely pulmonary edema noted on CTA.  Plan for HD today with UF 3-3.5L as tolerated.  On 3L O2 at baseline.  Currently oxygenating well on 4L.    ESRD -  on HD TTS.  Due for HD today.  Orders written.  Will have to be separate in [facility] room.    Hypertension/volume  - Blood pressure mildly elevated.  Continue home meds.  Volume overload noted in CTA and on exam, plan for HD today with net UF 3-3.5L as tolerated.    Anemia of CKD - Hgb 9.1.  Unsure if on ESA. Will call outpatient unit and request orders.   Secondary Hyperparathyroidism -  Calcium and phos at goal.   Nutrition - Renal diet with fluid restrictions. Hx stroke w/residual aphasia OSA on CPAP Chronic A fib - on eliquis DMT2 - per primary  Jen Mow, PA-C [facility] 05/03/2021, 9:32 AM  Nephrology attending: I have personally seen and examined patient.  Chart reviewed and I agree with above. ESRD on HD TTS admitted with COVID-pneumonia and respiratory failure.  Plan for dialysis today UF as tolerated.  Receiving treatment for COVID as well as antibiotics treatment.  Katheran James, MD [facility].

## 2021-05-03 NOTE — H&P (Addendum)
History and Physical  Rachael Herrera GEX:[id number] DOB: 11-14-1960 DOA: 05/02/2021  Referring physician: Teressa Lower, MD PCP: Patient, No Pcp Per (Inactive)  Patient coming from: Home  Chief Complaint: Shortness of breath, cough  HPI: Rachael Herrera is an obese 60 y.o. female with medical history significant for ESRD on dialysis (TTS), prior CVA, A-Fib on Eliquis,  Essential hypertension, hyperlipidemia, T2DM, OSA on CPAP who presents to [facility] ED due to shortness of breath and increased oxygen need.  Patient was unable to provide a history, history was obtained from ED medical record.  Per report, patient had 2 to 3-day onset of cough, chest congestion, shortness of breath and fatigue.  She was chronically on 3 LPM of oxygen at home and on arrival to the ED O2 sat was 73% on room air.  This was increased to 6 LPM with improved oxygenation.  Cough was productive of rusty brown and bloody sputum.  There was no fever, chills, nausea, vomiting, chest pain or abdominal pain.  ED Course:  In the emergency department, she was intermittently tachypneic and tachycardic.  Work-up in the ED showed normocytic anemia, BUN/creatinine 26/5.11 with eGFR of 9.  BNP 1659.9, troponin x2 was negative.  Influenza A, B was negative.  SARS coronavirus 2 was positive.  O2 sats has since been weaned down to 4 LPM with good saturations. Chest x-ray was suggestive of possible multifocal pneumonia CT angiography chest with contrast showed no pulmonary embolism, but showed septal thickening and scattered groundglass, likely related to CHF, left lower lobe consolidation suspicious for concurrent pneumonia Patient was treated with IV ceftriaxone and azithromycin.  Hospitalist was asked to admit patient for further evaluation and management.  Review of Systems: Constitutional: Negative for chills and fever.  HENT: Negative for ear pain and sore throat.   Eyes: Negative for pain and visual disturbance.  Respiratory:  Positive for cough and shortness of breath.   Cardiovascular: Negative for chest pain and palpitations.  Gastrointestinal: Negative for abdominal pain and vomiting.  Endocrine: Negative for polyphagia and polyuria.  Genitourinary: Negative for decreased urine volume, dysuria, enuresis Musculoskeletal: Negative for arthralgias and back pain.  Skin: Negative for color change and rash.  Allergic/Immunologic: Negative for immunocompromised state.  Neurological: Negative for tremors, syncope, speech difficulty, weakness, light-headedness and headaches.  Hematological: Does not bruise/bleed easily.  All other systems reviewed and are negative   Past Medical History:  Diagnosis Date   Cavitary pneumonia 06/2017   due to MSSA   Claudication of both lower extremities ([place])    DDD (degenerative disc disease), cervical    DDD (degenerative disc disease), lumbosacral    Diabetes mellitus ([place])    Diabetic neuropathy ([place])    ESRD (end stage renal disease) on dialysis ([place])    GERD (gastroesophageal reflux disease)    NSVT (nonsustained ventricular tachycardia) 10/20201   OSA on CPAP    Stroke (HCC)    Ulnar neuropathy    BUE   Vitamin B 12 deficiency    Past Surgical History:  Procedure Laterality Date   IR ANGIO VERTEBRAL SEL VERTEBRAL UNI L MOD SED  07/11/2020   IR CT HEAD LTD  07/11/2020   IR PERCUTANEOUS ART THROMBECTOMY/INFUSION INTRACRANIAL INC DIAG ANGIO  07/11/2020   IR US GUIDE VASC ACCESS LEFT  07/12/2020   RADIOLOGY WITH ANESTHESIA N/A 07/11/2020   Procedure: IR WITH ANESTHESIA;  Surgeon: Radiologist, Medication, MD;  Location: [place];  Service: Radiology;  Laterality: N/A;    Social History:  reports that she has never smoked. She has never used smokeless tobacco. She reports that she does not currently use alcohol. She reports that she does not currently use drugs.   No Known Allergies  Family History  Problem Relation Age of Onset   Diabetes Mother    Heart disease Mother     Diabetes Father    Heart disease Father    Diabetes Sister      Prior to Admission medications   Medication Sig Start Date End Date Taking? Authorizing Provider  amitriptyline (ELAVIL) 25 MG tablet Take 25 mg by mouth at bedtime. 06/16/20   [provider]  amLODipine (NORVASC) 10 MG tablet Take 10 mg by mouth daily. 06/16/20   [provider]  apixaban (ELIQUIS) 5 MG TABS tablet Take 1 tablet (5 mg total) by mouth 2 (two) times daily. 07/29/20   Regalado, Belkys A, MD  atorvastatin (LIPITOR) 80 MG tablet Take 80 mg by mouth every evening. 04/06/19   [provider]  cholecalciferol (VITAMIN D3) 25 MCG (1000 UNIT) tablet Take 1,000 Units by mouth daily.    [provider]  cinacalcet (SENSIPAR) 60 MG tablet Take 60 mg by mouth daily.    [provider]  gabapentin (NEURONTIN) 300 MG capsule Take 300 mg by mouth daily. 04/30/14   [provider]  insulin aspart (NOVOLOG) 100 UNIT/ML injection Inject 2 Units into the skin 3 (three) times daily with meals. 07/29/20   Regalado, Belkys A, MD  insulin glargine (LANTUS) 100 UNIT/ML injection Inject 0.35 mLs (35 Units total) into the skin 2 (two) times daily. 07/29/20   Regalado, Belkys A, MD  levothyroxine (SYNTHROID) 100 MCG tablet Take 1 tablet (100 mcg total) by mouth daily at 6 (six) AM. 07/30/20   Regalado, Belkys A, MD  losartan (COZAAR) 50 MG tablet Take 50 mg by mouth daily. 06/16/20   [provider]  polyethylene glycol (MIRALAX / GLYCOLAX) 17 g packet Take 17 g by mouth daily. 07/30/20   Regalado, Belkys A, MD  senna-docusate (SENOKOT-S) 8.6-50 MG tablet Take 1 tablet by mouth at bedtime as needed for mild constipation. 07/29/20   Regalado, Belkys A, MD  sevelamer carbonate (RENVELA) 800 MG tablet Take 800 mg by mouth 3 (three) times daily. 06/16/20   [provider]  torsemide (DEMADEX) 20 MG tablet Take 20 mg by mouth daily. 06/16/20   [provider]  vitamin B-12  (CYANOCOBALAMIN) 1000 MCG tablet Take 2,000 mcg by mouth daily.    [provider]  vitamin E 180 MG (400 UNITS) capsule Take 400 Units by mouth daily.    [provider]    Physical Exam: BP (!) 147/67 (BP Location: Right Arm)   Pulse 86   Temp 98.6 F (37 C) (Oral)   Resp 19   Ht $R'5\' 7"'lg$  (1.702 m)   Wt 104.9 kg   SpO2 97%   BMI 36.22 kg/m   General: 60 y.o. year-old female well developed well nourished in no acute distress.  Alert and oriented x3. HEENT: NCAT, EOMI Neck: Supple, trachea medial Cardiovascular: Regular rate and rhythm with no rubs or gallops.  No thyromegaly or JVD noted.   2/4 pulses in all 4 extremities. Respiratory: Lower lobe bilateral rales on auscultation.  No respiratory distress Abdomen: Soft, nontender nondistended with normal bowel sounds x4 quadrants. Muskuloskeletal: Bilateral lower extremity edema.  No cyanosis or clubbing noted bilaterally Neuro: CN II-XII intact, strength 5/5 x 4, sensation, reflexes intact Skin: No  ulcerative lesions noted or rashes Psychiatry: Mood is appropriate for condition and setting          Labs on Admission:  Basic Metabolic Panel: Recent Labs  Lab 05/02/21 0947  NA 136  K 3.8  CL 98  CO2 27  GLUCOSE 90  BUN 26*  CREATININE 5.11*  CALCIUM 8.6*   Liver Function Tests: No results for input(s): AST, ALT, ALKPHOS, BILITOT, PROT, ALBUMIN in the last 168 hours. No results for input(s): LIPASE, AMYLASE in the last 168 hours. No results for input(s): AMMONIA in the last 168 hours. CBC: Recent Labs  Lab 05/02/21 0947  WBC 5.0  NEUTROABS 3.6  HGB 10.1*  HCT 34.8*  MCV 95.1  PLT 171   Cardiac Enzymes: No results for input(s): CKTOTAL, CKMB, CKMBINDEX, TROPONINI in the last 168 hours.  BNP (last 3 results) Recent Labs    05/02/21 0947  BNP 1,659.9*    ProBNP (last 3 results) No results for input(s): PROBNP in the last 8760 hours.  CBG: Recent Labs  Lab 05/02/21 1242  GLUCAP 86     Radiological Exams on Admission: CT Angio Chest PE W and/or Wo Contrast  Result Date: 05/02/2021 CLINICAL DATA:  Cough. Congestion. Shortness of breath for few days. On dialysis. EXAM: CT ANGIOGRAPHY CHEST WITH CONTRAST TECHNIQUE: Multidetector CT imaging of the chest was performed using the standard protocol during bolus administration of intravenous contrast. Multiplanar CT image reconstructions and MIPs were obtained to evaluate the vascular anatomy. CONTRAST:  51mL OMNIPAQUE IOHEXOL 350 MG/ML SOLN 80 cc Omnipaque 350 COMPARISON:  Plain film earlier today.  Chest CT 12/26/2018. FINDINGS: Cardiovascular: The quality of this exam for evaluation of pulmonary embolism is moderate. Although the bolus is well timed, the exam is generally of decreased sensitivity secondary to patient body habitus and arm position, not raised above the head. No pulmonary embolism to the large segmental level. Aortic atherosclerosis. Tortuous thoracic aorta. Moderate cardiomegaly, without pericardial effusion. Left main and 3 vessel coronary artery calcification. Pulmonary artery enlargement, outflow tract 3.4 cm Mediastinum/Nodes: Prominent low right jugular nodes, including at 9 mm on 04/04. 11 mm right paratracheal node on 16/4, new. Node within the azygoesophageal recess measures 11 mm on 41/4, newly enlarged. Right hilar node of 1.4 cm is upper normal sized. Lungs/Pleura: Trace left larger than right pleural effusions. Poor inspiratory effort. Patchy areas of ground-glass opacity and smooth septal thickening bilaterally. More dense consolidation in the left lower lobe. Upper Abdomen: Reflux of contrast in the IVC and hepatic veins can be seen with elevated right heart pressures. Normal imaged portions of the spleen, stomach. Musculoskeletal: No acute osseous abnormality. Review of the MIP images confirms the above findings. IMPRESSION: 1. Multifactorial degradation, primarily patient body habitus and arm position. No  pulmonary embolism to the large segmental level. 2. Septal thickening and scattered ground-glass, likely related to congestive heart failure, especially given bilateral pleural effusions. 3. More focal left lower lobe consolidation, suspicious for concurrent pneumonia. 4. Thoracic and borderline low cervical adenopathy, likely reactive in the setting of heart failure. This could be re-evaluated with chest CT at 6 months if desired. 5. Age advanced coronary artery atherosclerosis. Recommend assessment of coronary risk factors and consideration of medical therapy. 6. Pulmonary artery enlargement suggests pulmonary arterial hypertension. Electronically Signed   By: Abigail Miyamoto M.D.   On: 05/02/2021 13:40   DG Chest Port 1 View  Result Date: 05/02/2021 CLINICAL DATA:  Dyspnea, respiratory distress in a 60 year old female. EXAM: PORTABLE CHEST  1 VIEW COMPARISON:  December 09, 2020. FINDINGS: EKG leads project over the chest. Cardiomediastinal contours and hilar structures are grossly stable accounting for obscured LEFT and RIGHT heart borders in the setting of interstitial and alveolar opacity which is diffuse but worse at the lung bases mild asymmetry. Heart size enlarged and accentuated by portable technique and AP projection. No lobar level consolidative process though there is obscured contour of the LEFT hemidiaphragm. No visible pneumothorax. On limited assessment there is no acute skeletal process. IMPRESSION: Findings of may represent sequela of marked asymmetric edema. Difficult to exclude the possibility of multifocal pneumonia. Correlate with signs of heart failure and infection. Cardiomegaly as before. Electronically Signed   By: Zetta Bills M.D.   On: 05/02/2021 10:39    EKG: I independently viewed the EKG done and my findings are as followed: A. fib with rate control  Assessment/Plan Present on Admission:  Fluid overload  Essential hypertension  Obesity  OSA (obstructive sleep  apnea)  Principal Problem:   Fluid overload Active Problems:   Type 2 diabetes mellitus (HCC)   Essential hypertension   Obesity   OSA (obstructive sleep apnea)   ESRD on dialysis ([place])   Acute on chronic respiratory failure with hypoxia (HCC)   Elevated brain natriuretic peptide (BNP) level   Cavitary pneumonia   Pneumonia due to COVID-19 virus   Mixed hyperlipidemia   History of CVA (cerebrovascular accident)   Acute on chronic respiratory failure possibly secondary to multifactorial including possible CHF, cavitary pneumonia and COVID-19 virus pneumonia. CT angiography of chest was suggestive of CHF and left lower lobe consolidation suspicious for concurrent pneumonia Continue management for both pathologies Continue supplemental oxygen to maintain O2 sat > 92% with plan to wean patient down to home oxygen requirement (currently at 4 LPM)  Elevated BNP rule out CHF BNP 1659.9 Continue total input/output, daily weights and fluid restriction Continue IV Lasix 40 twice daily Continue Cardiac diet  EKG shows A. fib with rate control Echocardiogram done in February 2022 showed LVEF of 55 to 60%.  No RWMA.  Moderate LV hypertrophy.  Echocardiogram will be done in the morning  Cardiology will be consulted and we shall await further recommendations  COVID-19 virus pneumonia with possible superimposed CAP POA SARS coronavirus 2 was positive CT angiography of chest showed left lower lobe consolidation suspicious for concurrent pneumonia Continue albuterol q.6h Continue IV Solu-Medrol per pharmacy dosing Continue IV Remdesivir per pharmacy protocol Continue vitamin-C 500 mg p.o. Daily Continue zinc 220 mg p.o. Daily Continue Mucinex, Robitussin and Tussionex Continue Tylenol p.r.n. for fever Continue supplemental oxygen to maintain O2 sat > or = 94% with plan to wean patient off supplemental oxygen as tolerated (of note, patient does not use oxygen at baseline) Continue incentive  spirometry and flutter valve q39min as tolerated Encourage proning, early ambulation, and side laying as tolerated Continue airborne isolation precaution Continue monitoring daily inflammatory markers Patient was already started on IV ceftriaxone and azithromycin in the ED due to presumed CAP, we shall continue with same at this time, procalcitonin will be checked for possible superimposed bacterial pneumonia with goal to discontinue antibiotics for negative procalcitonin  ESRD on HD (TTS) Nephrology was consulted for maintenance dialysis Continue Renvela, Sensipar  History of CVA Continue Lipitor, Eliquis  Chronic atrial fibrillation (on Eliquis) Continue Eliquis No rate control medication noted on patient's med rec, we shall await updated med rec  Obesity (BMI 36.22 Kg/m) Patient was counseled on diet and Nancyi medication  Essential hypertension Continue Norvasc, losartan  Type 2 diabetes mellitus Continue ISS and hypoglycemic protocol Continue Levemir, Tradjenta  Mixed hyperlipidemia Continue Lipitor  OSA on CPAP Continue CPAP  Hypothyroidism Continue Synthroid   DVT prophylaxis: Eliquis  Code Status: Full code  Family Communication: None at bedside  Disposition Plan:  Patient is from:                        home Anticipated DC to:                   SNF or family members home Anticipated DC date:               2-3 days Anticipated DC barriers:          Patient requires inpatient management due to acute on chronic respiratory failure possibly secondary to COVID-19 virus pneumonia with possible superimposed CAP and possible CHF   Consults called: Cardiology, nephrology  Admission status: Inpatient    Bernadette Hoit MD [facility]  05/03/2021, 1:08 AM

## 2021-05-04 ENCOUNTER — Other Ambulatory Visit (HOSPITAL_COMMUNITY): Payer: Medicare Other

## 2021-05-04 LAB — CBC WITH DIFFERENTIAL/PLATELET
Abs Immature Granulocytes: 0.1 10*3/uL — ABNORMAL HIGH (ref 0.00–0.07)
Basophils Absolute: 0 10*3/uL (ref 0.0–0.1)
Basophils Relative: 1 %
Eosinophils Absolute: 0 10*3/uL (ref 0.0–0.5)
Eosinophils Relative: 0 %
HCT: 33.4 % — ABNORMAL LOW (ref 36.0–46.0)
Hemoglobin: 9.7 g/dL — ABNORMAL LOW (ref 12.0–15.0)
Immature Granulocytes: 2 %
Lymphocytes Relative: 11 %
Lymphs Abs: 0.7 10*3/uL (ref 0.7–4.0)
MCH: 28 pg (ref 26.0–34.0)
MCHC: 29 g/dL — ABNORMAL LOW (ref 30.0–36.0)
MCV: 96.5 fL (ref 80.0–100.0)
Monocytes Absolute: 0.7 10*3/uL (ref 0.1–1.0)
Monocytes Relative: 12 %
Neutro Abs: 4.5 10*3/uL (ref 1.7–7.7)
Neutrophils Relative %: 74 %
Platelets: 229 10*3/uL (ref 150–400)
RBC: 3.46 MIL/uL — ABNORMAL LOW (ref 3.87–5.11)
RDW: 16.7 % — ABNORMAL HIGH (ref 11.5–15.5)
WBC: 6 10*3/uL (ref 4.0–10.5)
nRBC: 0 % (ref 0.0–0.2)

## 2021-05-04 LAB — PHOSPHORUS: Phosphorus: 5.1 mg/dL — ABNORMAL HIGH (ref 2.5–4.6)

## 2021-05-04 LAB — C-REACTIVE PROTEIN: CRP: 9.8 mg/dL — ABNORMAL HIGH (ref <1.0)

## 2021-05-04 LAB — GLUCOSE, CAPILLARY: Glucose-Capillary: 160 mg/dL — ABNORMAL HIGH (ref 70–99)

## 2021-05-04 LAB — HEPATITIS B SURFACE ANTIBODY, QUANTITATIVE: Hep B S AB Quant (Post): >1000 m[IU]/mL (ref >9.9)

## 2021-05-04 LAB — FERRITIN: Ferritin: 361 ng/mL — ABNORMAL HIGH (ref 11–307)

## 2021-05-04 LAB — MAGNESIUM: Magnesium: 2.2 mg/dL (ref 1.7–2.4)

## 2021-05-04 LAB — D-DIMER, QUANTITATIVE: D-Dimer, Quant: 1.19 ug/mL-FEU — ABNORMAL HIGH (ref 0.00–0.50)

## 2021-05-04 MED ORDER — CINACALCET HCL 30 MG PO TABS
30.0000 mg | ORAL_TABLET | Freq: Every day | ORAL | 0 refills | Status: DC
Start: 2021-05-04 — End: 2024-02-17

## 2021-05-04 MED ORDER — PREDNISONE 50 MG PO TABS: 50.0000 mg | ORAL_TABLET | Freq: Every day | ORAL | 0 refills | Status: AC

## 2021-05-04 MED ORDER — AMOXICILLIN-POT CLAVULANATE 500-125 MG PO TABS: 1.0000 | ORAL_TABLET | Freq: Two times a day (BID) | ORAL | 0 refills | Status: AC

## 2021-05-04 NOTE — Discharge Summary (Signed)
Physician Discharge Summary  Rachael Herrera QQV:[id number] DOB: [birth year] DOA: 05/02/2021  PCP: Patient, No Pcp Per (Inactive)  Admit date: 05/02/2021 Discharge date: 05/04/2021 30 Day Unplanned Readmission Risk Score    Flowsheet Row ED to Hosp-Admission (Current) from 05/02/2021 in [facility]  30 Day Unplanned Readmission Risk Score (%) 30.33 Filed at 05/04/2021 0801       This score is the patient's risk of an unplanned readmission within 30 days of being discharged (0 -100%). The score is based on dignosis, age, lab data, medications, orders, and past utilization.   Low:  0-14.9   Medium: 15-21.9   High: 22-29.9   Extreme: 30 and above          Admitted From: Home Disposition: Home  Recommendations for Outpatient Follow-up:  Follow up with PCP in 1-2 weeks Please obtain BMP/CBC in one week Please follow up with your PCP on the following pending results: Unresulted Labs (From admission, onward)     Start     Ordered   05/03/21 0859  Hepatitis B surface antibody,quantitative  (New Admission Hemo Labs (Hepatitis B))  Once,   R        05/03/21 0858   05/03/21 0125  Phosphorus  Daily,   R      05/03/21 0132   05/03/21 0125  Magnesium  Daily,   R      05/03/21 0132   05/03/21 0125  Ferritin  Daily,   R      05/03/21 0132   05/03/21 0124  D-dimer, quantitative  Daily,   R      05/03/21 0132   05/03/21 0124  C-reactive protein  Daily,   R      05/03/21 0132   05/03/21 0124  CBC with Differential/Platelet  Daily,   R      05/03/21 0132              Home Health: Yes Equipment/Devices: None  Discharge Condition: Stable CODE STATUS: Full code Diet recommendation: Cardiac  Subjective: Seen and examined.  Very comfortable.  No complaints.  Brief/Interim Summary: Rachael Herrera is an obese 60 y.o. female with medical history significant for ESRD on dialysis (TTS), prior CVA, A-Fib on Eliquis,  Essential hypertension, hyperlipidemia, T2DM, OSA on CPAP  who [facility] ED due to shortness of breath and increased oxygen need as well as cough for past 3 days.  She was chronically on 3 LPM of oxygen at home and on arrival to the ED O2 sat was 73% on room air.  This was increased to 6 LPM with improved oxygenation. There was no fever, chills, nausea, vomiting, chest pain or abdominal pain.   In the emergency department, she was intermittently tachypneic and tachycardic. Work-up in the ED showed normocytic anemia, BUN/creatinine 26/5.11 with eGFR of 9.  BNP 1659.9, troponin x2 was negative.  Influenza A, B was negative.  SARS coronavirus 2 was positive. Chest x-ray was suggestive of possible multifocal pneumonia. CT angiography chest with contrast showed no pulmonary embolism, but showed septal thickening and scattered groundglass, likely related to CHF, left lower lobe consolidation suspicious for concurrent pneumonia Patient was treated with IV ceftriaxone and azithromycin.  Hospitalist was asked to admit patient for further evaluation and management.  Due to elevated procalcitonin and suspicion of superimposed bacterial infection, antibiotics Rocephin and Zithromax were resumed.  She was initially started on IV Lasix for her acute on chronic diastolic congestive heart failure however  patient does not make much urine and she is a dialysis patient so Lasix was stopped, she received her dialysis, postdialysis, she felt much improvement, she was weaned to 3 L of oxygen which is her baseline.  Patient was also started on remdesivir as well as Solu-Medrol for her COVID 19 infection.  Now that patient is back to her baseline, she is going to be discharged home.  She will receive third dose of remdesivir before discharge.  She will be discharged on 5 more days of oral prednisone and Augmentin.  She will resume her rest of the home medications and follow-up with nephrology for dialysis.  Discussed with her daughter yesterday about plan of discharge  today.  History of CVA with residual severe dysarthria: Patient able to talk but her speech is incomprehensible and that is her baseline according to the daughter who confirmed. Continue Lipitor, Eliquis  Chronic atrial fibrillation (on Eliquis) Continue Eliquis No rate control medication noted on patient's med rec, we shall await updated med rec  Essential hypertension: Controlled. Continue Norvasc, losartan  Type 2 diabetes mellitus: Resume home medications  Discharge Diagnoses:  Principal Problem:   Acute on chronic respiratory failure with hypoxia (HCC) Active Problems:   Type 2 diabetes mellitus (HCC)   Essential hypertension   Obesity   OSA (obstructive sleep apnea)   ESRD on dialysis (HCC)   Elevated brain natriuretic peptide (BNP) level   Cavitary pneumonia   Pneumonia due to COVID-19 virus   Mixed hyperlipidemia   History of CVA (cerebrovascular accident)   Atrial fibrillation, chronic ([place])    Discharge Instructions   Allergies as of 05/04/2021   No Known Allergies      Medication List     TAKE these medications    amitriptyline 25 MG tablet Commonly known as: ELAVIL Take 25 mg by mouth at bedtime.   amLODipine 10 MG tablet Commonly known as: NORVASC Take 10 mg by mouth daily.   amoxicillin-clavulanate 500-125 MG tablet Commonly known as: Augmentin Take 1 tablet (500 mg total) by mouth 2 (two) times daily for 4 days.   apixaban 5 MG Tabs tablet Commonly known as: ELIQUIS Take 1 tablet (5 mg total) by mouth 2 (two) times daily.   atorvastatin 80 MG tablet Commonly known as: LIPITOR Take 80 mg by mouth every evening.   cholecalciferol 25 MCG (1000 UNIT) tablet Commonly known as: VITAMIN D3 Take 1,000 Units by mouth daily.   cinacalcet 30 MG tablet Commonly known as: SENSIPAR Take 1 tablet (30 mg total) by mouth daily with breakfast. What changed: when to take this   diclofenac Sodium 1 % Gel Commonly known as: VOLTAREN Apply 2 g  topically 4 (four) times daily. Apply to knee   gabapentin 300 MG capsule Commonly known as: NEURONTIN Take 300 mg by mouth daily.   insulin aspart 100 UNIT/ML injection Commonly known as: novoLOG Inject 2 Units into the skin 3 (three) times daily with meals.   insulin glargine 100 UNIT/ML injection Commonly known as: LANTUS Inject 0.35 mLs (35 Units total) into the skin 2 (two) times daily.   Lantus SoloStar 100 UNIT/ML Solostar Pen Generic drug: insulin glargine Inject 30 Units into the skin daily.   insulin lispro 100 UNIT/ML injection Commonly known as: HUMALOG Inject 0-14 Units into the skin 3 (three) times daily before meals. Per sliding scale   levothyroxine 150 MCG tablet Commonly known as: SYNTHROID Take 150 mcg by mouth daily before breakfast.   levothyroxine 100 MCG tablet  Commonly known as: SYNTHROID Take 1 tablet (100 mcg total) by mouth daily at 6 (six) AM.   losartan 50 MG tablet Commonly known as: COZAAR Take 50 mg by mouth daily.   omeprazole 20 MG capsule Commonly known as: PRILOSEC Take 20 mg by mouth daily.   polyethylene glycol 17 g packet Commonly known as: MIRALAX / GLYCOLAX Take 17 g by mouth daily.   predniSONE 50 MG tablet Commonly known as: DELTASONE Take 1 tablet (50 mg total) by mouth daily with breakfast for 5 days.   senna-docusate 8.6-50 MG tablet Commonly known as: Senokot-S Take 1 tablet by mouth at bedtime as needed for mild constipation.   sevelamer carbonate 800 MG tablet Commonly known as: RENVELA Take 800 mg by mouth 3 (three) times daily.   torsemide 20 MG tablet Commonly known as: DEMADEX Take 20 mg by mouth daily.   vitamin E 180 MG (400 UNITS) capsule Take 400 Units by mouth daily.        No Known Allergies  Consultations: Nephrology   Procedures/Studies: CT Angio Chest PE W and/or Wo Contrast  Result Date: 05/02/2021 CLINICAL DATA:  Cough. Congestion. Shortness of breath for few days. On dialysis.  EXAM: CT ANGIOGRAPHY CHEST WITH CONTRAST TECHNIQUE: Multidetector CT imaging of the chest was performed using the standard protocol during bolus administration of intravenous contrast. Multiplanar CT image reconstructions and MIPs were obtained to evaluate the vascular anatomy. CONTRAST:  43m OMNIPAQUE IOHEXOL 350 MG/ML SOLN 80 cc Omnipaque 350 COMPARISON:  Plain film earlier today.  Chest CT 12/26/2018. FINDINGS: Cardiovascular: The quality of this exam for evaluation of pulmonary embolism is moderate. Although the bolus is well timed, the exam is generally of decreased sensitivity secondary to patient body habitus and arm position, not raised above the head. No pulmonary embolism to the large segmental level. Aortic atherosclerosis. Tortuous thoracic aorta. Moderate cardiomegaly, without pericardial effusion. Left main and 3 vessel coronary artery calcification. Pulmonary artery enlargement, outflow tract 3.4 cm Mediastinum/Nodes: Prominent low right jugular nodes, including at 9 mm on 04/04. 11 mm right paratracheal node on 16/4, new. Node within the azygoesophageal recess measures 11 mm on 41/4, newly enlarged. Right hilar node of 1.4 cm is upper normal sized. Lungs/Pleura: Trace left larger than right pleural effusions. Poor inspiratory effort. Patchy areas of ground-glass opacity and smooth septal thickening bilaterally. More dense consolidation in the left lower lobe. Upper Abdomen: Reflux of contrast in the IVC and hepatic veins can be seen with elevated right heart pressures. Normal imaged portions of the spleen, stomach. Musculoskeletal: No acute osseous abnormality. Review of the MIP images confirms the above findings. IMPRESSION: 1. Multifactorial degradation, primarily patient body habitus and arm position. No pulmonary embolism to the large segmental level. 2. Septal thickening and scattered ground-glass, likely related to congestive heart failure, especially given bilateral pleural effusions. 3. More  focal left lower lobe consolidation, suspicious for concurrent pneumonia. 4. Thoracic and borderline low cervical adenopathy, likely reactive in the setting of heart failure. This could be re-evaluated with chest CT at 6 months if desired. 5. Age advanced coronary artery atherosclerosis. Recommend assessment of coronary risk factors and consideration of medical therapy. 6. Pulmonary artery enlargement suggests pulmonary arterial hypertension. Electronically Signed   By: KAbigail MiyamotoM.D.   On: 05/02/2021 13:40   DG Chest Port 1 View  Result Date: 05/02/2021 CLINICAL DATA:  Dyspnea, respiratory distress in a 60year old female. EXAM: PORTABLE CHEST 1 VIEW COMPARISON:  December 09, 2020. FINDINGS: EKG leads project  over the chest. Cardiomediastinal contours and hilar structures are grossly stable accounting for obscured LEFT and RIGHT heart borders in the setting of interstitial and alveolar opacity which is diffuse but worse at the lung bases mild asymmetry. Heart size enlarged and accentuated by portable technique and AP projection. No lobar level consolidative process though there is obscured contour of the LEFT hemidiaphragm. No visible pneumothorax. On limited assessment there is no acute skeletal process. IMPRESSION: Findings of may represent sequela of marked asymmetric edema. Difficult to exclude the possibility of multifocal pneumonia. Correlate with signs of heart failure and infection. Cardiomegaly as before. Electronically Signed   By: Zetta Bills M.D.   On: 05/02/2021 10:39     Discharge Exam: Vitals:   05/04/21 0607 05/04/21 0637  BP:  (!) 125/57  Pulse: 78 78  Resp:  12  Temp:  98.8 F (37.1 C)  SpO2:  100%   Vitals:   05/04/21 0507 05/04/21 0537 05/04/21 0607 05/04/21 0637  BP: 108/69 124/60  (!) 125/57  Pulse: 78 79 78 78  Resp: _0 Temp:    98.8 F (37.1 C)  TempSrc:    Oral  SpO2: 100% 100%  100%  Weight:      Height:        General: Pt is alert, awake, not in  acute distress Cardiovascular: RRR, S1/S2 +, no rubs, no gallops Respiratory: CTA bilaterally, no wheezing, no rhonchi Abdominal: Soft, NT, ND, bowel sounds + Extremities: no edema, no cyanosis    The results of significant diagnostics from this hospitalization (including imaging, microbiology, ancillary and laboratory) are listed below for reference.     Microbiology: Recent Results (from the past 240 hour(s))  Resp Panel by RT-PCR (Flu A&B, Covid) Nasopharyngeal Swab     Status: Abnormal   Collection Time: 05/02/21  4:35 PM   Specimen: Nasopharyngeal Swab; Nasopharyngeal(NP) swabs in vial transport medium  Result Value Ref Range Status   SARS Coronavirus 2 by RT PCR POSITIVE (A) NEGATIVE Final    Comment: RESULT CALLED TO, READ BACK BY AND VERIFIED WITH:  COBLE,S RN _1  05/02/21 EDENSCA (NOTE) SARS-CoV-2 target nucleic acids are DETECTED.  The SARS-CoV-2 RNA is generally detectable in upper respiratory specimens during the acute phase of infection. Positive results are indicative of the presence of the identified virus, but do not rule out bacterial infection or co-infection with other pathogens not detected by the test. Clinical correlation with patient history and other diagnostic information is necessary to determine patient infection status. The expected result is Negative.  Fact Sheet for Patients: EntrepreneurPulse.com.au  Fact Sheet for Healthcare Providers: IncredibleEmployment.be  This test is not yet approved or cleared by the [place] FDA and  has been authorized for detection and/or diagnosis of SARS-CoV-2 by FDA under an Emergency Use Authorization (EUA).  This EUA will remain in effect (meaning this test can be  used) for the duration of  the COVID-19 declaration under Section 564(b)(1) of the Act, 21 U.S.C. section 360bbb-3(b)(1), unless the authorization is terminated or revoked sooner.     Influenza A by PCR  NEGATIVE NEGATIVE Final   Influenza B by PCR NEGATIVE NEGATIVE Final    Comment: (NOTE) The Xpert Xpress SARS-CoV-2/FLU/RSV plus assay is intended as an aid in the diagnosis of influenza from Nasopharyngeal swab specimens and should not be used as a sole basis for treatment. Nasal washings and aspirates are unacceptable for Xpert Xpress SARS-CoV-2/FLU/RSV testing.  Fact Sheet for Patients: EntrepreneurPulse.com.au  Fact Sheet for Healthcare Providers: IncredibleEmployment.be  This test is not yet approved or cleared by the [place] FDA and has been authorized for detection and/or diagnosis of SARS-CoV-2 by FDA under an Emergency Use Authorization (EUA). This EUA will remain in effect (meaning this test can be used) for the duration of the COVID-19 declaration under Section 564(b)(1) of the Act, 21 U.S.C. section 360bbb-3(b)(1), unless the authorization is terminated or revoked.  Performed at [facility], [address] [place], [place] [address]      Labs: BNP (last 3 results) Recent Labs    05/02/21 0947  BNP 7,408.1*   Basic Metabolic Panel: Recent Labs  Lab 05/02/21 0947 05/03/21 0316 05/04/21 0102  NA 136  --   --   K 3.8  --   --   CL 98  --   --   CO2 27  --   --   GLUCOSE 90  --   --   BUN 26*  --   --   CREATININE 5.11*  --   --   CALCIUM 8.6*  --   --   MG  --  1.9 2.2  PHOS  --  4.1 5.1*   Liver Function Tests: No results for input(s): AST, ALT, ALKPHOS, BILITOT, PROT, ALBUMIN in the last 168 hours. No results for input(s): LIPASE, AMYLASE in the last 168 hours. No results for input(s): AMMONIA in the last 168 hours. CBC: Recent Labs  Lab 05/02/21 0947 05/03/21 0316 05/04/21 0102  WBC 5.0 7.1 6.0  NEUTROABS 3.6 5.6 4.5  HGB 10.1* 9.1* 9.7*  HCT 34.8* 32.1* 33.4*  MCV 95.1 96.7 96.5  PLT 171 178 229   Cardiac Enzymes: No results for input(s): CKTOTAL, CKMB, CKMBINDEX, TROPONINI in the  last 168 hours. BNP: Invalid input(s): POCBNP CBG: Recent Labs  Lab 05/03/21 0611 05/03/21 1207 05/03/21 1609 05/03/21 2108 05/04/21 0811  GLUCAP 158* 189* 277* 268* 160*   D-Dimer Recent Labs    05/03/21 0316 05/04/21 0102  DDIMER 0.74* 1.19*   Hgb A1c No results for input(s): HGBA1C in the last 72 hours. Lipid Profile No results for input(s): CHOL, HDL, LDLCALC, TRIG, CHOLHDL, LDLDIRECT in the last 72 hours. Thyroid function studies No results for input(s): TSH, T4TOTAL, T3FREE, THYROIDAB in the last 72 hours.  Invalid input(s): FREET3 Anemia work up Recent Labs    05/03/21 0316 05/04/21 0102  FERRITIN 382* 361*   Urinalysis No results found for: COLORURINE, APPEARANCEUR, LABSPEC, [place], GLUCOSEU, HGBUR, BILIRUBINUR, KETONESUR, PROTEINUR, UROBILINOGEN, NITRITE, LEUKOCYTESUR Sepsis Labs Invalid input(s): PROCALCITONIN,  WBC,  LACTICIDVEN Microbiology Recent Results (from the past 240 hour(s))  Resp Panel by RT-PCR (Flu A&B, Covid) Nasopharyngeal Swab     Status: Abnormal   Collection Time: 05/02/21  4:35 PM   Specimen: Nasopharyngeal Swab; Nasopharyngeal(NP) swabs in vial transport medium  Result Value Ref Range Status   SARS Coronavirus 2 by RT PCR POSITIVE (A) NEGATIVE Final    Comment: RESULT CALLED TO, READ BACK BY AND VERIFIED WITH:  COBLE,S RN _0  05/02/21 EDENSCA (NOTE) SARS-CoV-2 target nucleic acids are DETECTED.  The SARS-CoV-2 RNA is generally detectable in upper respiratory specimens during the acute phase of infection. Positive results are indicative of the presence of the identified virus, but do not rule out bacterial infection or co-infection with other pathogens not detected by the test. Clinical correlation with patient history and other diagnostic information is necessary to determine patient infection status. The expected result is Negative.  Fact Sheet for Patients: EntrepreneurPulse.com.au  Fact Sheet for  Healthcare Providers: IncredibleEmployment.be  This test is not yet approved or cleared by the [place] FDA and  has been authorized for detection and/or diagnosis of SARS-CoV-2 by FDA under an Emergency Use Authorization (EUA).  This EUA will remain in effect (meaning this test can be  used) for the duration of  the COVID-19 declaration under Section 564(b)(1) of the Act, 21 U.S.C. section 360bbb-3(b)(1), unless the authorization is terminated or revoked sooner.     Influenza A by PCR NEGATIVE NEGATIVE Final   Influenza B by PCR NEGATIVE NEGATIVE Final    Comment: (NOTE) The Xpert Xpress SARS-CoV-2/FLU/RSV plus assay is intended as an aid in the diagnosis of influenza from Nasopharyngeal swab specimens and should not be used as a sole basis for treatment. Nasal washings and aspirates are unacceptable for Xpert Xpress SARS-CoV-2/FLU/RSV testing.  Fact Sheet for Patients: EntrepreneurPulse.com.au  Fact Sheet for Healthcare Providers: IncredibleEmployment.be  This test is not yet approved or cleared by the [place] FDA and has been authorized for detection and/or diagnosis of SARS-CoV-2 by FDA under an Emergency Use Authorization (EUA). This EUA will remain in effect (meaning this test can be used) for the duration of the COVID-19 declaration under Section 564(b)(1) of the Act, 21 U.S.C. section 360bbb-3(b)(1), unless the authorization is terminated or revoked.  Performed at [facility], [address] [place], [place] [address]      Time coordinating discharge: Over 30 minutes  SIGNED:   Darliss Cheney, MD  [facility] 05/04/2021, 10:30 AM  If 7PM-7AM, please contact night-coverage [email or web address]

## 2021-05-04 NOTE — TOC Transition Note (Signed)
Transition of Care [facility]) - CM/SW Discharge Note   Patient Details  Name: DEJANEE THIBEAUX MRN: [id number] Date of Birth: 05/17/1961  Transition of [facility]) CM/SW Contact:  Carles Collet, RN Phone Number: 05/04/2021, 10:39 AM   Clinical Narrative:    Damaris Schooner w patient over the phone. No preference for [facility] agency.  Referral accepted by [facility]. No DME needs     Final next level of care: Home w Home Health Services Barriers to Discharge: No Barriers Identified   Patient Goals and CMS Choice Patient states their goals for this hospitalization and ongoing recovery are:: to go home      Discharge Placement                       Discharge Plan and Services                DME Arranged: N/A         HH Arranged: PT, OT HH Agency: [place] Date [place]: 05/04/21 Time [place]: 1039 Representative spoke with at [place]: Amy  Social Determinants of Health ([place]) Interventions     Readmission Risk Interventions No flowsheet data found.

## 2021-05-04 NOTE — Progress Notes (Addendum)
[facility] Progress Note   Subjective:   Patient seen and examined at bedside post dialysis.  HD RN reports patient tolerated treatment well.  Back on baseline O2.    Objective Vitals:   05/04/21 0507 05/04/21 0537 05/04/21 0607 05/04/21 0637  BP: 108/69 124/60  (!) 125/57  Pulse: 78 79 78 78  Resp: 11 10  12   Temp:    98.8 F (37.1 C)  TempSrc:    Oral  SpO2: 100% 100%  100%  Weight:      Height:       Physical Exam General:chronically ill appearing female in NAD Heart:RRR, no mrg Lungs:CTAB, nml WOB on 3L via  Abdomen:soft, NTND Extremities:trace LE edema Dialysis Access: LU AVF +b/t  Filed Weights   05/02/21 1910 05/03/21 0400 05/04/21 0237  Weight: 104.9 kg 106 kg 107.3 kg    Intake/Output Summary (Last 24 hours) at 05/04/2021 0711 Last data filed at 05/04/2021 8676 Gross per 24 hour  Intake 828 ml  Output 3500 ml  Net -2672 ml    Additional Objective Labs: Basic Metabolic Panel: Recent Labs  Lab 05/02/21 0947 05/03/21 0316 05/04/21 0102  NA 136  --   --   K 3.8  --   --   CL 98  --   --   CO2 27  --   --   GLUCOSE 90  --   --   BUN 26*  --   --   CREATININE 5.11*  --   --   CALCIUM 8.6*  --   --   PHOS  --  4.1 5.1*   CBC: Recent Labs  Lab 05/02/21 0947 05/03/21 0316 05/04/21 0102  WBC 5.0 7.1 6.0  NEUTROABS 3.6 5.6 4.5  HGB 10.1* 9.1* 9.7*  HCT 34.8* 32.1* 33.4*  MCV 95.1 96.7 96.5  PLT 171 178 229   CBG: Recent Labs  Lab 05/02/21 1242 05/03/21 0611 05/03/21 1207 05/03/21 1609 05/03/21 2108  GLUCAP 86 158* 189* 277* 268*   Iron Studies:  Recent Labs    05/04/21 0102  FERRITIN 361*   Lab Results  Component Value Date   INR 1.0 07/11/2020   Studies/Results: CT Angio Chest PE W and/or Wo Contrast  Result Date: 05/02/2021 CLINICAL DATA:  Cough. Congestion. Shortness of breath for few days. On dialysis. EXAM: CT ANGIOGRAPHY CHEST WITH CONTRAST TECHNIQUE: Multidetector CT imaging of the chest was  performed using the standard protocol during bolus administration of intravenous contrast. Multiplanar CT image reconstructions and MIPs were obtained to evaluate the vascular anatomy. CONTRAST:  28mL OMNIPAQUE IOHEXOL 350 MG/ML SOLN 80 cc Omnipaque 350 COMPARISON:  Plain film earlier today.  Chest CT 12/26/2018. FINDINGS: Cardiovascular: The quality of this exam for evaluation of pulmonary embolism is moderate. Although the bolus is well timed, the exam is generally of decreased sensitivity secondary to patient body habitus and arm position, not raised above the head. No pulmonary embolism to the large segmental level. Aortic atherosclerosis. Tortuous thoracic aorta. Moderate cardiomegaly, without pericardial effusion. Left main and 3 vessel coronary artery calcification. Pulmonary artery enlargement, outflow tract 3.4 cm Mediastinum/Nodes: Prominent low right jugular nodes, including at 9 mm on 04/04. 11 mm right paratracheal node on 16/4, new. Node within the azygoesophageal recess measures 11 mm on 41/4, newly enlarged. Right hilar node of 1.4 cm is upper normal sized. Lungs/Pleura: Trace left larger than right pleural effusions. Poor inspiratory effort. Patchy areas of ground-glass opacity and smooth septal thickening bilaterally. More dense  consolidation in the left lower lobe. Upper Abdomen: Reflux of contrast in the IVC and hepatic veins can be seen with elevated right heart pressures. Normal imaged portions of the spleen, stomach. Musculoskeletal: No acute osseous abnormality. Review of the MIP images confirms the above findings. IMPRESSION: 1. Multifactorial degradation, primarily patient body habitus and arm position. No pulmonary embolism to the large segmental level. 2. Septal thickening and scattered ground-glass, likely related to congestive heart failure, especially given bilateral pleural effusions. 3. More focal left lower lobe consolidation, suspicious for concurrent pneumonia. 4. Thoracic and  borderline low cervical adenopathy, likely reactive in the setting of heart failure. This could be re-evaluated with chest CT at 6 months if desired. 5. Age advanced coronary artery atherosclerosis. Recommend assessment of coronary risk factors and consideration of medical therapy. 6. Pulmonary artery enlargement suggests pulmonary arterial hypertension. Electronically Signed   By: Abigail Miyamoto M.D.   On: 05/02/2021 13:40   DG Chest Port 1 View  Result Date: 05/02/2021 CLINICAL DATA:  Dyspnea, respiratory distress in a 60 year old female. EXAM: PORTABLE CHEST 1 VIEW COMPARISON:  December 09, 2020. FINDINGS: EKG leads project over the chest. Cardiomediastinal contours and hilar structures are grossly stable accounting for obscured LEFT and RIGHT heart borders in the setting of interstitial and alveolar opacity which is diffuse but worse at the lung bases mild asymmetry. Heart size enlarged and accentuated by portable technique and AP projection. No lobar level consolidative process though there is obscured contour of the LEFT hemidiaphragm. No visible pneumothorax. On limited assessment there is no acute skeletal process. IMPRESSION: Findings of may represent sequela of marked asymmetric edema. Difficult to exclude the possibility of multifocal pneumonia. Correlate with signs of heart failure and infection. Cardiomegaly as before. Electronically Signed   By: Zetta Bills M.D.   On: 05/02/2021 10:39    Medications:  sodium chloride     sodium chloride     azithromycin Stopped (05/03/21 1507)   cefTRIAXone (ROCEPHIN)  IV Stopped (05/03/21 1204)   remdesivir 100 mg in NS 100 mL      albuterol  2 puff Inhalation Q6H   amitriptyline  25 mg Oral QHS   amLODipine  10 mg Oral Daily   apixaban  5 mg Oral BID   vitamin C  500 mg Oral Daily   atorvastatin  80 mg Oral QPM   Chlorhexidine Gluconate Cloth  6 each Topical Q0600   cholecalciferol  1,000 Units Oral Daily   cinacalcet  60 mg Oral Q lunch    gabapentin  300 mg Oral Daily   insulin aspart  0-5 Units Subcutaneous QHS   insulin aspart  0-9 Units Subcutaneous TID WC   insulin aspart  2 Units Subcutaneous TID WC   insulin detemir  10 Units Subcutaneous BID   levothyroxine  150 mcg Oral QAC breakfast   linagliptin  5 mg Oral Daily   losartan  50 mg Oral Daily   methylPREDNISolone (SOLU-MEDROL) injection  105 mg Intravenous Q24H   Followed by   Derrill Memo ON 05/05/2021] predniSONE  50 mg Oral Daily   pantoprazole  40 mg Oral Daily   sevelamer carbonate  800 mg Oral TID with meals   vitamin E  400 Units Oral Daily   zinc sulfate  220 mg Oral Daily    Dialysis Orders: Per Care Everywhere Note 04/24/21 TTS - [facility]  3.5hrs, BFR 350, DFR 700,  EDW 273kg, 2K/ 3Ca   Access: LU AVF Heparin standard Sensipar 30mg   qd  Attempted to reach outpatient unit on 12/10 with no answer.  Will call again on Monday.   Assessment/Plan:  COVID and superimposed CAP - CTA with LLL consolidation suspicious for PNA.  On ceftriaxone, azithromycin, remdesivir, solu-medrol.  Per primary Acute on chronic respiratory failure - 2/2 #1 and likely pulmonary edema noted on CTA.  Tolerated HD well.  Back to baseline O2.   ESRD -  on HD TTS.  Next HD 05/06/21.  Hypertension/volume  - Blood pressure in goal. Continue home meds.  Tolerated UF   Anemia of CKD - Hgb improved to 9.7.  Unsure if on ESA. Will call outpatient unit and request orders.   Secondary Hyperparathyroidism -  Calcium and phos at goal.   Nutrition - Renal diet with fluid restrictions. Hx stroke w/residual aphasia OSA on CPAP Chronic A fib - on eliquis DMT2 - per primary  Jen Mow, PA-C [facility] 05/04/2021,7:11 AM  LOS: 2 days   Nephrology attending: I have personally seen and examined the patient.  I agree with above. Status post dialysis early this morning.  She looks clinically stable.  Plan for next HD per TTS schedule.  Katheran James, MD [facility].

## 2021-05-04 NOTE — Evaluation (Signed)
Occupational Therapy Evaluation Patient Details Name: Rachael Herrera MRN: [id number] DOB: 10-08-1960 Today's Date: 05/04/2021   History of Present Illness Pt is a 60 y.o. female who presented 05/02/21 with SOB. Admitted with acute on chronic respiratory failure possibly secondary to multifactorial including pulmonary edema secondary to acute on chronic congestive heart failure with preserved ejection fraction, COVID-19 pneumonia, community-acquired pneumonia. Pt on 3L O2 at home. PMH: ESRD on dialysis (TTS), prior CVA, A-Fib on Eliquis,  Essential hypertension, hyperlipidemia, T2DM, OSA on CPAP   Clinical Impression   Pt admitted for concerns listed above. PTA pt's family reporting that she was typically independent, at times requiring some assist due to the weakness on her R side.  At this time, pt requiring min-max A with all mobility and ADL's. She presents with deficits concerning her R side strength and coordination, communication, balance, activity tolerance, and cognition. She is at an increased risk of falling at this time as well. Recommending HHOT services upon hospital discharge.      Recommendations for follow up therapy are one component of a multi-disciplinary discharge planning process, led by the attending physician.  Recommendations may be updated based on patient status, additional functional criteria and insurance authorization.   Follow Up Recommendations  Home health OT    Assistance Recommended at Discharge Frequent or constant Supervision/Assistance  Functional Status Assessment  Patient has had a recent decline in their functional status and/or demonstrates limited ability to make significant improvements in function in a reasonable and predictable amount of time  Equipment Recommendations  None recommended by OT    Recommendations for Other Services       Precautions / Restrictions Precautions Precautions: Fall;Other (comment) Precaution Comments: monitor SpO2;  prior CVA with R weakness and severe dysarthria Restrictions Weight Bearing Restrictions: No      Mobility Bed Mobility Overal bed mobility: Needs Assistance Bed Mobility: Supine to Sit     Supine to sit: Supervision;HOB elevated     General bed mobility comments: Pt up in recliner on entry    Transfers Overall transfer level: Needs assistance Equipment used: Rolling walker (2 wheels) Transfers: Sit to/from Stand Sit to Stand: Min assist     Step pivot transfers: Min assist;Mod assist     General transfer comment: Sit to stand x3 from recliner with minA to power up and steady. MAx cuing for initiation and sequencing.      Balance Overall balance assessment: Needs assistance Sitting-balance support: No upper extremity supported;Feet supported Sitting balance-Leahy Scale: Fair Sitting balance - Comments: Static sitting scooted forward on recliner, sup for safety   Standing balance support: Reliant on assistive device for balance Standing balance-Leahy Scale: Poor Standing balance comment: Reliant on RW and up to minA                           ADL either performed or assessed with clinical judgement   ADL Overall ADL's : Needs assistance/impaired Eating/Feeding: Minimal assistance;Sitting   Grooming: Minimal assistance;Cueing for sequencing;Sitting   Upper Body Bathing: Moderate assistance;Sitting   Lower Body Bathing: Maximal assistance;Sitting/lateral leans;Sit to/from stand   Upper Body Dressing : Moderate assistance;Sitting   Lower Body Dressing: Maximal assistance;Sitting/lateral leans;Sit to/from stand   Toilet Transfer: Moderate assistance;Stand-pivot   Toileting- Clothing Manipulation and Hygiene: Maximal assistance;Sitting/lateral lean;Sit to/from stand       Functional mobility during ADLs: Minimal assistance;Moderate assistance;Rolling walker (2 wheels) General ADL Comments: Pt presents with cognitive deficits, weakness, balance  concerns, and poor coordination, requiring min-max A.     Vision Baseline Vision/History: 1 Wears glasses Ability to See in Adequate Light: 1 Impaired Patient Visual Report: Other (comment) (Unsure as pt was unable to report what she saw) Vision Assessment?: Vision impaired- to be further tested in functional context Additional Comments: Pt has difficulty following commands, unsure how her vision is. Functionally pt only ambulated in a small area.     Perception     Praxis Praxis Praxis-Other Comments: Would need further testing due to poor cognition    Pertinent Vitals/Pain Pain Assessment: Faces Faces Pain Scale: Hurts little more Pain Location: R knee Pain Descriptors / Indicators: Grimacing;Guarding Pain Intervention(s): Monitored during session;Repositioned     Hand Dominance Right (likely now L handed as CVA affected R side)   Extremity/Trunk Assessment Upper Extremity Assessment Upper Extremity Assessment: RUE deficits/detail;LUE deficits/detail RUE Deficits / Details: Prior CVA affected - limited movement, pt unable to lift against gravity, minimal grip strength. RUE Sensation: decreased light touch RUE Coordination: decreased fine motor;decreased gross motor LUE Deficits / Details: ROM limited with shoulder flexion and abduction, pt flexes with assistance to 90 degrees and 80 degrees for abduction. pt did not grip well, however unsure if that is due to cognition or weakness. LUE Sensation: decreased light touch LUE Coordination: decreased fine motor;decreased gross motor   Lower Extremity Assessment Lower Extremity Assessment: Defer to PT evaluation   Cervical / Trunk Assessment Cervical / Trunk Assessment: Normal   Communication Communication Communication: Receptive difficulties;Expressive difficulties   Cognition Arousal/Alertness: Awake/alert Behavior During Therapy: Impulsive;Flat affect Overall Cognitive Status: History of cognitive impairments - at  baseline                                 General Comments: Pt with prior CVA affecting receptive and expressive communication, thus pt with difficulty following commands, but benefits from multi-modal cues especially visual demonstration. Pt with mild impulsiveity to try to stand or sit prematurely compared to what therapist cued pt to do. Difficulty sequencing steps and RW     General Comments  O2 remaining 90% and above with good pleth on 3L O2    Exercises     Shoulder Instructions      Home Living Family/patient expects to be discharged to:: Private residence Living Arrangements: Children Available Help at Discharge: Family;Available 24 hours/day Type of Home: House Home Access: Stairs to enter [employer] of Steps: 7 Entrance Stairs-Rails: Can reach both;Right;Left Home Layout: One level     Bathroom Shower/Tub: Occupational psychologist: Handicapped height     Home Equipment: Conservation officer, nature (2 wheels);Wheelchair - power;Shower seat - built in;Grab bars - tub/shower;Grab bars - toilet;Hand held shower head          Prior Functioning/Environment Prior Level of Function : Needs assist             Mobility Comments: Daughter reports that she tries to allow the pt to do all mobility and tasks by herself, but if pt needs help she asks for it and the daughter can assist her. Pt uses RW. ADLs Comments: Pt does not cook or clean. Daughter reports that she tries to allow the pt to do all dressing, bathing, and other ADLs by herself, but if pt needs help she asks for it and the daughter can assist her.        OT Problem List: Decreased strength;Decreased activity  tolerance;Impaired balance (sitting and/or standing);Decreased cognition;Decreased safety awareness;Decreased knowledge of use of DME or AE;Cardiopulmonary status limiting activity      OT Treatment/Interventions:      OT Goals(Current goals can be found in the care plan  section) Acute Rehab OT Goals Patient Stated Goal: None stated OT Goal Formulation: Patient unable to participate in goal setting Time For Goal Achievement: 05/04/21 Potential to Achieve Goals: Fair  OT Frequency:     Barriers to D/C:            Co-evaluation              AM-PAC OT "6 Clicks" Daily Activity     Outcome Measure Help from another person eating meals?: A Little Help from another person taking care of personal grooming?: A Little Help from another person toileting, which includes using toliet, bedpan, or urinal?: A Lot Help from another person bathing (including washing, rinsing, drying)?: A Lot Help from another person to put on and taking off regular upper body clothing?: A Lot Help from another person to put on and taking off regular lower body clothing?: A Lot 6 Click Score: 14   End of Session Equipment Utilized During Treatment: Gait belt;Rolling walker (2 wheels) Nurse Communication: Mobility status  Activity Tolerance: Patient tolerated treatment well Patient left: in chair;with call bell/phone within reach;with chair alarm set  OT Visit Diagnosis: Unsteadiness on feet (R26.81);Other abnormalities of gait and mobility (R26.89);Muscle weakness (generalized) (M62.81)                Time: 4580-9983 OT Time Calculation (min): 14 min Charges:  OT General Charges $OT Visit: 1 Visit OT Evaluation $OT Eval Moderate Complexity: 1 Mod  Scotlyn Mccranie H., OTR/L Acute Rehabilitation  Kerly Rigsbee Elane Shanique Aslinger 05/04/2021, 4:01 PM

## 2021-05-04 NOTE — Evaluation (Signed)
Physical Therapy Evaluation Patient Details Name: Rachael Herrera MRN: [id number] DOB: Oct 31, 1960 Today's Date: 05/04/2021  History of Present Illness  Pt is a 60 y.o. female who presented 05/02/21 with SOB. Admitted with acute on chronic respiratory failure possibly secondary to multifactorial including pulmonary edema secondary to acute on chronic congestive heart failure with preserved ejection fraction, COVID-19 pneumonia, community-acquired pneumonia. Pt on 3L O2 at home. PMH: ESRD on dialysis (TTS), prior CVA, A-Fib on Eliquis,  Essential hypertension, hyperlipidemia, T2DM, OSA on CPAP   Clinical Impression  Pt presents with condition above and deficits mentioned below, see PT Problem List. PTA, she was intermittent mod I with RW and other AE but then intermittently required assistance from her daughter for mobility and ADLs, depending on how strong her R side was day to day. Currently, pt displays deficits in R-sided strength, communication, coordination, balance, activity tolerance, and cognition that place her at risk for falls. Pt had significant difficulty clearing and advancing her R when attempting to step today, needing minA to just take a few lateral steps at EOB to transfer to the recliner. Pt also slightly impulsive, needing up to modA to safely direct her buttocks to the recliner as she sat prematurely. Educated pt's daughter via phone call that pt will need constant physical assistance at this time due to being weaker than normal. Recommend pt follow-up with HHPT and [place]. Will continue to follow acutely.       Recommendations for follow up therapy are one component of a multi-disciplinary discharge planning process, led by the attending physician.  Recommendations may be updated based on patient status, additional functional criteria and insurance authorization.  Follow Up Recommendations Home health PT    Assistance Recommended at Discharge Frequent or constant  Supervision/Assistance  Functional Status Assessment Patient has had a recent decline in their functional status and demonstrates the ability to make significant improvements in function in a reasonable and predictable amount of time.  Equipment Recommendations  None recommended by PT    Recommendations for Other Services       Precautions / Restrictions Precautions Precautions: Fall;Other (comment) Precaution Comments: monitor SpO2; prior CVA with R weakness and severe dysarthria Restrictions Weight Bearing Restrictions: No      Mobility  Bed Mobility Overal bed mobility: Needs Assistance Bed Mobility: Supine to Sit     Supine to sit: Supervision;HOB elevated     General bed mobility comments: Extra time but pt able to transition supine > sit L EOB with HOB elevated with supervision for safety.    Transfers Overall transfer level: Needs assistance Equipment used: Rolling walker (2 wheels) Transfers: Sit to/from Stand;Bed to chair/wheelchair/BSC Sit to Stand: Min assist   Step pivot transfers: Min assist;Mod assist       General transfer comment: Sit to stand 2x from EOB with minA to power up and steady. Pt initially with minA to balance and slide RW laterally to transfer bed > recliner towards her R, cuing for advancing R foot, but she began to sit prematurely before fully in front of middle of seat, needing modA to direct buttocks safely to chair.    Ambulation/Gait Ambulation/Gait assistance: Min assist Gait Distance (Feet): 2 Feet Assistive device: Rolling walker (2 wheels) Gait Pattern/deviations: Step-to pattern;Decreased step length - right;Decreased stride length;Decreased dorsiflexion - right;Shuffle Gait velocity: reduced Gait velocity interpretation: <1.31 ft/sec, indicative of household ambulator   General Gait Details: Pt with short, shuffling lateral steps at EOB to transfer to recliner towards her R.  Pt with difficulty clearing R foot off floor to  avdance. Attempted to assist R leg but pt displayed pain with assistance at her R knee. MinA to steady pt and slide RW laterally for her.  Stairs            Wheelchair Mobility    Modified Rankin (Stroke Patients Only)       Balance Overall balance assessment: Needs assistance Sitting-balance support: No upper extremity supported;Feet supported Sitting balance-Leahy Scale: Fair Sitting balance - Comments: Static sitting EOB with supervision for safety   Standing balance support: Reliant on assistive device for balance Standing balance-Leahy Scale: Poor Standing balance comment: Reliant on RW and up to minA                             Pertinent Vitals/Pain Pain Assessment: Faces Faces Pain Scale: Hurts little more Pain Location: R knee Pain Descriptors / Indicators: Grimacing;Guarding Pain Intervention(s): Limited activity within patient's tolerance;Monitored during session;Repositioned    Home Living Family/patient expects to be discharged to:: Private residence Living Arrangements: Children Available Help at Discharge: Family;Available 24 hours/day Type of Home: House Home Access: Stairs to enter Entrance Stairs-Rails: Can reach both;Right;Left Entrance Stairs-Number of Steps: 7   Home Layout: One level Home Equipment: Conservation officer, nature (2 wheels);Wheelchair - power;Shower seat - built in;Grab bars - tub/shower;Grab bars - toilet;Hand held shower head      Prior Function Prior Level of Function : Needs assist             Mobility Comments: Daughter reports that she tries to allow the pt to do all mobility and tasks by herself, but if pt needs help she asks for it and the daughter can assist her. Pt uses RW. ADLs Comments: Pt does not cook or clean. Daughter reports that she tries to allow the pt to do all dressing, bathing, and other ADLs by herself, but if pt needs help she asks for it and the daughter can assist her.     Hand Dominance    Dominant Hand: Right (likely now L handed as CVA affected R side)    Extremity/Trunk Assessment   Upper Extremity Assessment Upper Extremity Assessment: Defer to OT evaluation    Lower Extremity Assessment Lower Extremity Assessment: RLE deficits/detail;Generalized weakness RLE Deficits / Details: prior CVA affecting R side; MMT scores of 4- hip flexion, 4+ knee extension; daughter reports R knee pain at baseline, noted with attempts to assist moving it when ambulating; gross incoordination RLE Sensation: decreased proprioception RLE Coordination: decreased gross motor;decreased fine motor    Cervical / Trunk Assessment Cervical / Trunk Assessment: Normal  Communication   Communication: Receptive difficulties;Expressive difficulties  Cognition Arousal/Alertness: Awake/alert Behavior During Therapy: Impulsive;Flat affect Overall Cognitive Status: History of cognitive impairments - at baseline                                 General Comments: Pt with prior CVA affecting receptive and expressive communication, thus pt with difficulty following commands, but benefits from multi-modal cues especially visual demonstration. Pt with mild impulsiveity to try to stand or sit prematurely compared to what therapist cued pt to do. Difficulty sequencing steps and RW        General Comments General comments (skin integrity, edema, etc.): SpO2 decreasing to 70s% on RA, requiring 3 L O2 via [place] to keep SpO2 >/= 90%  Exercises     Assessment/Plan    PT Assessment Patient needs continued PT services  PT Problem List Decreased range of motion;Decreased strength;Decreased activity tolerance;Decreased balance;Decreased mobility;Decreased cognition;Decreased coordination;Decreased knowledge of use of DME;Decreased safety awareness;Cardiopulmonary status limiting activity;Impaired sensation       PT Treatment Interventions DME instruction;Gait training;Stair training;Functional  mobility training;Therapeutic activities;Therapeutic exercise;Balance training;Neuromuscular re-education;Cognitive remediation;Patient/family education;Wheelchair mobility training    PT Goals (Current goals can be found in the Care Plan section)  Acute Rehab PT Goals Patient Stated Goal: did not state; daughter reported desire for HHPT/OT PT Goal Formulation: With patient/family Time For Goal Achievement: 05/18/21 Potential to Achieve Goals: Good    Frequency Min 3X/week   Barriers to discharge        Co-evaluation               AM-PAC PT "6 Clicks" Mobility  Outcome Measure Help needed turning from your back to your side while in a flat bed without using bedrails?: A Little Help needed moving from lying on your back to sitting on the side of a flat bed without using bedrails?: A Little Help needed moving to and from a bed to a chair (including a wheelchair)?: A Lot Help needed standing up from a chair using your arms (e.g., wheelchair or bedside chair)?: A Little Help needed to walk in hospital room?: Total Help needed climbing 3-5 steps with a railing? : Total 6 Click Score: 13    End of Session Equipment Utilized During Treatment: Gait belt;Oxygen Activity Tolerance: Patient tolerated treatment well Patient left: in chair;with call bell/phone within reach;with chair alarm set Nurse Communication: Mobility status;Other (comment) (vitals) PT Visit Diagnosis: Unsteadiness on feet (R26.81);Other abnormalities of gait and mobility (R26.89);Muscle weakness (generalized) (M62.81);Difficulty in walking, not elsewhere classified (R26.2);Other symptoms and signs involving the nervous system (R29.898)    Time: 9937-1696 PT Time Calculation (min) (ACUTE ONLY): 28 min   Charges:   PT Evaluation $PT Eval Moderate Complexity: 1 Mod PT Treatments $Therapeutic Activity: 8-22 mins        Moishe Spice, PT, DPT Acute Rehabilitation Services  Pager: [phone] Office:  [phone]   Orvan Falconer 05/04/2021, 12:50 PM

## 2021-06-05 ENCOUNTER — Other Ambulatory Visit (HOSPITAL_BASED_OUTPATIENT_CLINIC_OR_DEPARTMENT_OTHER): Payer: Self-pay

## 2022-02-03 ENCOUNTER — Encounter (HOSPITAL_BASED_OUTPATIENT_CLINIC_OR_DEPARTMENT_OTHER): Payer: Self-pay

## 2022-02-03 ENCOUNTER — Emergency Department (HOSPITAL_BASED_OUTPATIENT_CLINIC_OR_DEPARTMENT_OTHER)
Admission: EM | Admit: 2022-02-03 | Discharge: 2022-02-03 | Disposition: A | Discharge status: Home / Self Care | Payer: Medicare Other | Attending: Emergency Medicine | Admitting: Emergency Medicine

## 2022-02-03 ENCOUNTER — Emergency Department (HOSPITAL_BASED_OUTPATIENT_CLINIC_OR_DEPARTMENT_OTHER): Payer: Medicare Other

## 2022-02-03 DIAGNOSIS — N186 End stage renal disease: Secondary | ICD-10-CM | POA: Diagnosis not present

## 2022-02-03 DIAGNOSIS — R2243 Localized swelling, mass and lump, lower limb, bilateral: Secondary | ICD-10-CM | POA: Diagnosis present

## 2022-02-03 DIAGNOSIS — Z7901 Long term (current) use of anticoagulants: Secondary | ICD-10-CM | POA: Insufficient documentation

## 2022-02-03 DIAGNOSIS — R0600 Dyspnea, unspecified: Secondary | ICD-10-CM | POA: Diagnosis not present

## 2022-02-03 DIAGNOSIS — Z8673 Personal history of transient ischemic attack (TIA), and cerebral infarction without residual deficits: Secondary | ICD-10-CM | POA: Diagnosis not present

## 2022-02-03 DIAGNOSIS — Z992 Dependence on renal dialysis: Secondary | ICD-10-CM | POA: Diagnosis not present

## 2022-02-03 DIAGNOSIS — Z794 Long term (current) use of insulin: Secondary | ICD-10-CM | POA: Insufficient documentation

## 2022-02-03 DIAGNOSIS — E1140 Type 2 diabetes mellitus with diabetic neuropathy, unspecified: Secondary | ICD-10-CM | POA: Insufficient documentation

## 2022-02-03 DIAGNOSIS — M7989 Other specified soft tissue disorders: Secondary | ICD-10-CM

## 2022-02-03 LAB — BASIC METABOLIC PANEL
Anion gap: 12 (ref 5–15)
BUN: 27 mg/dL — ABNORMAL HIGH (ref 8–23)
CO2: 28 mmol/L (ref 22–32)
Calcium: 8.5 mg/dL — ABNORMAL LOW (ref 8.9–10.3)
Chloride: 94 mmol/L — ABNORMAL LOW (ref 98–111)
Creatinine, Ser: 4.93 mg/dL — ABNORMAL HIGH (ref 0.44–1.00)
GFR, Estimated: 9 mL/min — ABNORMAL LOW (ref >60)
Glucose, Bld: 146 mg/dL — ABNORMAL HIGH (ref 70–99)
Potassium: 3.9 mmol/L (ref 3.5–5.1)
Sodium: 134 mmol/L — ABNORMAL LOW (ref 135–145)

## 2022-02-03 LAB — CBC WITH DIFFERENTIAL/PLATELET
Abs Immature Granulocytes: 0.01 10*3/uL (ref 0.00–0.07)
Basophils Absolute: 0 10*3/uL (ref 0.0–0.1)
Basophils Relative: 1 %
Eosinophils Absolute: 0.1 10*3/uL (ref 0.0–0.5)
Eosinophils Relative: 3 %
HCT: 42.1 % (ref 36.0–46.0)
Hemoglobin: 13 g/dL (ref 12.0–15.0)
Immature Granulocytes: 0 %
Lymphocytes Relative: 30 %
Lymphs Abs: 1.2 10*3/uL (ref 0.7–4.0)
MCH: 29.1 pg (ref 26.0–34.0)
MCHC: 30.9 g/dL (ref 30.0–36.0)
MCV: 94.4 fL (ref 80.0–100.0)
Monocytes Absolute: 0.5 10*3/uL (ref 0.1–1.0)
Monocytes Relative: 12 %
Neutro Abs: 2.2 10*3/uL (ref 1.7–7.7)
Neutrophils Relative %: 54 %
Platelets: 156 10*3/uL (ref 150–400)
RBC: 4.46 MIL/uL (ref 3.87–5.11)
RDW: 15.8 % — ABNORMAL HIGH (ref 11.5–15.5)
WBC: 4 10*3/uL (ref 4.0–10.5)
nRBC: 0 % (ref 0.0–0.2)

## 2022-02-03 LAB — BRAIN NATRIURETIC PEPTIDE: B Natriuretic Peptide: 1266.1 pg/mL — ABNORMAL HIGH (ref 0.0–100.0)

## 2022-02-03 NOTE — ED Notes (Signed)
US at bedside

## 2022-02-03 NOTE — Discharge Instructions (Addendum)
Your work-up today was reassuring.  No concerning cause of your leg swelling.  Your BNP was elevated however it was less than what it was 9 months ago and your x-ray did not show any signs of volume overload.  Continue taking your torsemide.  You did have strong pulses on exam today.  Your DVT ultrasound was negative for a DVT.  Continue taking your blood thinner as directed.  For any concerning symptoms please return for evaluation otherwise follow-up with your vascular provider.  No signs of infection on the blood work, or exam.

## 2022-02-03 NOTE — ED Provider Notes (Signed)
[facility] Provider Note   CSN: [id number] Arrival date & time: 02/03/22  1133     History  Chief Complaint  Patient presents with   Leg Swelling    Rachael Herrera is a 61 y.o. female.  61 year old female presents today for evaluation of leg pain, swelling, and redness ongoing for about 1 week.  She does endorse recently having cortisone injection in the month of August over a 2-week span in both her right and left knee.  She denies fever, chills.  She does have history of ESRD on dialysis.  Has been getting doxycycline as well that was prescribed by her nephrologist for concern of cellulitis.  No change since then.  Denies fever.  Denies orthopnea, PND.  Reports chronic lower extremity swelling which is chronically worse on the right lower extremity.  She also has history of PAD for which she sees a vascular surgeon for.  She denies chest pain, shortness of breath.  Denies recent surgery, long travel by road, or flight.  Denies history of blood clots.  She is on anticoagulation and reports compliance with this.  She is on anticoagulation secondary to A-fib.  Has history of CVA with residual right-sided deficits.  The history is provided by the patient. No language interpreter was used.       Home Medications Prior to Admission medications   Medication Sig Start Date End Date Taking? Authorizing Provider  amitriptyline (ELAVIL) 25 MG tablet Take 25 mg by mouth at bedtime. 06/16/20   [provider]  amLODipine (NORVASC) 10 MG tablet Take 10 mg by mouth daily. 06/16/20   [provider]  apixaban (ELIQUIS) 5 MG TABS tablet Take 1 tablet (5 mg total) by mouth 2 (two) times daily. 07/29/20   Regalado, Belkys A, MD  atorvastatin (LIPITOR) 80 MG tablet Take 80 mg by mouth every evening. 04/06/19   [provider]  cholecalciferol (VITAMIN D3) 25 MCG (1000 UNIT) tablet Take 1,000 Units by mouth daily.    [provider]   cinacalcet (SENSIPAR) 30 MG tablet Take 1 tablet (30 mg total) by mouth daily with breakfast. 05/04/21   Darliss Cheney, MD  diclofenac Sodium (VOLTAREN) 1 % GEL Apply 2 g topically 4 (four) times daily. Apply to knee 04/22/21   [provider]  gabapentin (NEURONTIN) 300 MG capsule Take 300 mg by mouth daily. 04/30/14   [provider]  insulin aspart (NOVOLOG) 100 UNIT/ML injection Inject 2 Units into the skin 3 (three) times daily with meals. Patient not taking: Reported on 05/03/2021 07/29/20   Regalado, Jerald Kief A, MD  insulin glargine (LANTUS SOLOSTAR) 100 UNIT/ML Solostar Pen Inject 30 Units into the skin daily. 12/19/20   [provider]  insulin glargine (LANTUS) 100 UNIT/ML injection Inject 0.35 mLs (35 Units total) into the skin 2 (two) times daily. Patient not taking: Reported on 05/03/2021 07/29/20   Regalado, Jerald Kief A, MD  insulin lispro (HUMALOG) 100 UNIT/ML injection Inject 0-14 Units into the skin 3 (three) times daily before meals. Per sliding scale    [provider]  levothyroxine (SYNTHROID) 150 MCG tablet Take 150 mcg by mouth daily before breakfast.    [provider]  losartan (COZAAR) 50 MG tablet Take 50 mg by mouth daily. 06/16/20   [provider]  omeprazole (PRILOSEC) 20 MG capsule Take 20 mg by mouth daily.    [provider]  polyethylene glycol (MIRALAX / GLYCOLAX) 17 g packet Take 17 g by  mouth daily. Patient not taking: Reported on 05/03/2021 07/30/20   Regalado, Jerald Kief A, MD  senna-docusate (SENOKOT-S) 8.6-50 MG tablet Take 1 tablet by mouth at bedtime as needed for mild constipation. Patient not taking: Reported on 05/03/2021 07/29/20   Niel Hummer A, MD  sevelamer carbonate (RENVELA) 800 MG tablet Take 800 mg by mouth 3 (three) times daily. 06/16/20   [provider]  torsemide (DEMADEX) 20 MG tablet Take 20 mg by mouth daily. 06/16/20   [provider]  vitamin E 180 MG (400 UNITS) capsule  Take 400 Units by mouth daily.    [provider]      Allergies    Patient has no known allergies.    Review of Systems   Review of Systems  Constitutional:  Negative for chills and fever.  Respiratory:  Negative for shortness of breath.   Cardiovascular:  Positive for leg swelling. Negative for chest pain and palpitations.  Gastrointestinal:  Negative for abdominal pain.  Neurological:  Negative for weakness.  All other systems reviewed and are negative.   Physical Exam Updated Vital Signs BP (!) 131/93 (BP Location: Right Arm)   Pulse (!) 113   Temp (!) 97.4 F (36.3 C)   Resp 18   Ht 5\' 7"  (1.702 m)   SpO2 92%   BMI 37.05 kg/m  Physical Exam Vitals and nursing note reviewed.  Constitutional:      General: She is not in acute distress.    Appearance: Normal appearance. She is not ill-appearing.  HENT:     Head: Normocephalic and atraumatic.     Nose: Nose normal.  Eyes:     Conjunctiva/sclera: Conjunctivae normal.  Cardiovascular:     Rate and Rhythm: Normal rate and regular rhythm.     Pulses: Normal pulses.     Comments: Dopplerable DP pulses bilaterally Pulmonary:     Effort: Pulmonary effort is normal. No respiratory distress.     Breath sounds: Normal breath sounds. No wheezing or rales.  Abdominal:     General: There is no distension.     Palpations: Abdomen is soft.     Tenderness: There is no abdominal tenderness. There is no guarding.  Musculoskeletal:        General: No deformity.     Right lower leg: Edema (Trace pitting edema worse on the right) present.     Left lower leg: Edema (Trace pitting edema) present.  Skin:    Findings: No rash.  Neurological:     Mental Status: She is alert.     ED Results / Procedures / Treatments   Labs (all labs ordered are listed, but only abnormal results are displayed) Labs Reviewed  CBC WITH DIFFERENTIAL/PLATELET - Abnormal; Notable for the following components:      Result Value   RDW 15.8 (*)     All other components within normal limits  BASIC METABOLIC PANEL - Abnormal; Notable for the following components:   Sodium 134 (*)    Chloride 94 (*)    Glucose, Bld 146 (*)    BUN 27 (*)    Creatinine, Ser 4.93 (*)    Calcium 8.5 (*)    GFR, Estimated 9 (*)    All other components within normal limits  BRAIN NATRIURETIC PEPTIDE - Abnormal; Notable for the following components:   B Natriuretic Peptide 1,266.1 (*)    All other components within normal limits    EKG None  Radiology US Venous Img Lower Bilateral  Result  Date: 02/03/2022 CLINICAL DATA:  Bilateral lower extremity edema. EXAM: BILATERAL LOWER EXTREMITY VENOUS DOPPLER ULTRASOUND TECHNIQUE: Gray-scale sonography with compression, as well as color and duplex ultrasound, were performed to evaluate the deep venous system(s) from the level of the common femoral vein through the popliteal and proximal calf veins. COMPARISON:  None Available. FINDINGS: VENOUS Normal compressibility of the common femoral, superficial femoral, and popliteal veins, as well as the visualized calf veins. Visualized portions of profunda femoral vein and great saphenous vein unremarkable. No filling defects to suggest DVT on grayscale or color Doppler imaging. Doppler waveforms show normal direction of venous flow, normal respiratory plasticity and response to augmentation. Limited views of the contralateral common femoral vein are unremarkable. OTHER None. Limitations: none IMPRESSION: Negative. Electronically Signed   By: Kerby Moors M.D.   On: 02/03/2022 13:54   DG Chest Portable 1 View  Result Date: 02/03/2022 CLINICAL DATA:  Dyspnea EXAM: PORTABLE CHEST 1 VIEW COMPARISON:  Chest 09/02/2021 FINDINGS: Cardiac enlargement. Mild vascular congestion without edema. Improvement in vascular congestion since the prior study. No pleural effusion. IMPRESSION: Improvement in pulmonary vascular congestion. Negative for edema or effusion Electronically Signed    By: Franchot Gallo M.D.   On: 02/03/2022 13:26    Procedures Procedures    Medications Ordered in ED Medications - No data to display  ED Course/ Medical Decision Making/ A&P                           Medical Decision Making Amount and/or Complexity of Data Reviewed Labs: ordered. Radiology: ordered.   Medical Decision Making / ED Course   This patient presents to the ED for concern of leg swelling, this involves an extensive number of treatment options, and is a complaint that carries with it a high risk of complications and morbidity.  The differential diagnosis includes DVT, PAD,   MDM: 60 year old female with past medical history of CHF, A-fib, ESRD on dialysis for evaluation of redness, and swelling of the legs.  This is bilateral.  Denies fever.  Has been on doxycycline.  No signs of CHF exacerbation.  Will evaluate with DVT study, obtain blood work.  Will obtain chest x-ray. DVT study negative.  Chest x-ray without signs of volume overload.  Without leukocytosis, or anemia.  BNP elevated but less than what it was 9 months ago.  Doubt CHF exacerbation given no rales, no signs of volume overload on exam.  Patient is appropriate for discharge.  Discharged in stable condition.  Patient to follow-up with her vascular surgeon.  Patient also eager for discharge.  Return precautions discussed.  Patient and daughter voiced understanding and are in agreement with plan.  This is likely vascular related however no signs of acute ischemia.  Good dopplerable DP pulses and without resting pain.  Case discussed with attending Dr. Nechama Guard was in agreement with plan.  Patient is appropriate for discharge.  Discharged in stable condition.  Return precautions discussed.   Lab Tests: -I ordered, reviewed, and interpreted labs.   The pertinent results include:   Labs Reviewed  CBC WITH DIFFERENTIAL/PLATELET - Abnormal; Notable for the following components:      Result Value   RDW 15.8 (*)    All  other components within normal limits  BASIC METABOLIC PANEL - Abnormal; Notable for the following components:   Sodium 134 (*)    Chloride 94 (*)    Glucose, Bld 146 (*)    BUN  27 (*)    Creatinine, Ser 4.93 (*)    Calcium 8.5 (*)    GFR, Estimated 9 (*)    All other components within normal limits  BRAIN NATRIURETIC PEPTIDE - Abnormal; Notable for the following components:   B Natriuretic Peptide 1,266.1 (*)    All other components within normal limits      EKG  EKG Interpretation  Date/Time:    Ventricular Rate:    PR Interval:    QRS Duration:   QT Interval:    QTC Calculation:   R Axis:     Text Interpretation:           Imaging Studies ordered: I ordered imaging studies including chest x-ray, DVT study I independently visualized and interpreted imaging. I agree with the radiologist interpretation   Medicines ordered and prescription drug management: No orders of the defined types were placed in this encounter.   -I have reviewed the patients home medicines and have made adjustments as needed  Reevaluation: After the interventions noted above, I reevaluated the patient and found that they have :stayed the same  Co morbidities that complicate the patient evaluation  Past Medical History:  Diagnosis Date   Cavitary pneumonia 06/2017   due to MSSA   Claudication of both lower extremities ([place])    DDD (degenerative disc disease), cervical    DDD (degenerative disc disease), lumbosacral    Diabetes mellitus ([place])    Diabetic neuropathy ([place])    ESRD (end stage renal disease) on dialysis ([place])    GERD (gastroesophageal reflux disease)    NSVT (nonsustained ventricular tachycardia) ([place]) 10/20201   OSA on CPAP    Stroke ([place])    Ulnar neuropathy    BUE   Vitamin B 12 deficiency       Dispostion: Patient is appropriate for discharge.  Discharged in stable condition.  Without hypoxia, electrolyte derangements.  No indication for admission or  additional work-up.     Final Clinical Impression(s) / ED Diagnoses Final diagnoses:  Leg swelling    Rx / DC Orders ED Discharge Orders     None         Evlyn Courier, PA-C 02/03/22 1508    Elgie Congo, MD 02/03/22 1807

## 2022-02-03 NOTE — ED Triage Notes (Signed)
C/o bilateral leg swelling & redness x 1 week. Started on antibiotic on dialysis. Last dialysis tx today, completed fully. Hx diabetic neuropathy.

## 2022-03-02 IMAGING — CT CT ANGIO CHEST
2 of 8 series · 18 of 36 positions shown · IV contrast (Omnipaque)
Comparison: Plain film earlier today.  Chest CT 12/26/2018.

CLINICAL DATA: Cough. Congestion. Shortness of breath for few days.
On dialysis.

EXAM:
CT ANGIOGRAPHY CHEST WITH CONTRAST
TECHNIQUE: Multidetector CT imaging of the chest was performed using the
standard protocol during bolus administration of intravenous
contrast. Multiplanar CT image reconstructions and MIPs were
obtained to evaluate the vascular anatomy.
CONTRAST:  80mL OMNIPAQUE IOHEXOL 350 MG/ML SOLN 80 cc Omnipaque 350

[Series 6: pe coronal mpr · coronal · 0.51mm/px · 1 of 167 slices shown]
[im 84/167  mediastinal]
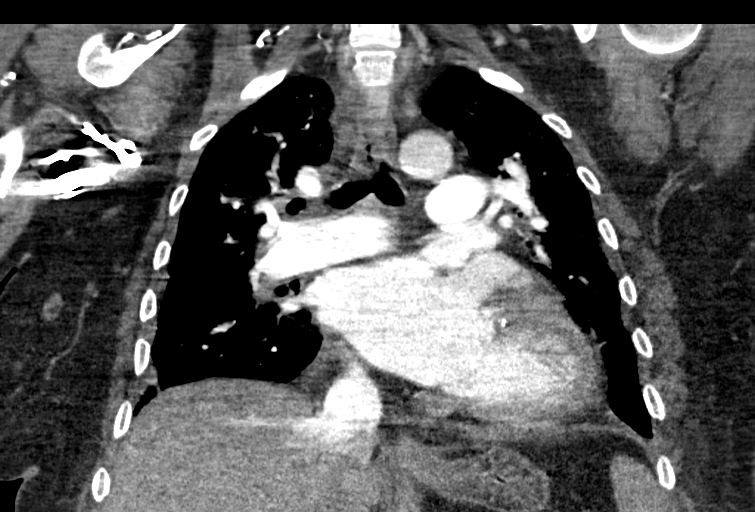

[Series 10: pe thins · axial · 0.84mm/px · z∈[+1244,+1466]mm · 17 of 250 slices shown]
[im 14/250  lung]
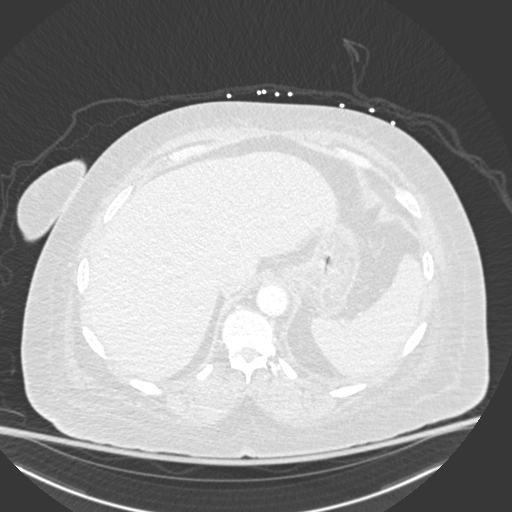
[im 27/250  mediastinal]
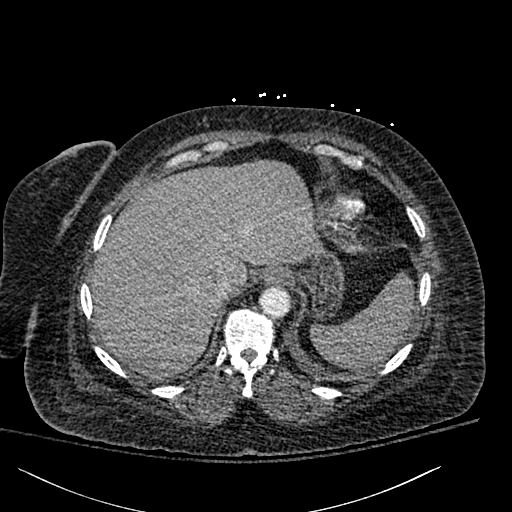
[im 40/250  lung]
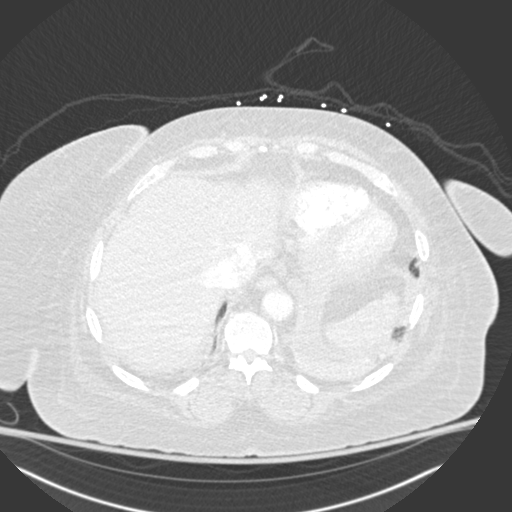
[im 53/250  mediastinal]
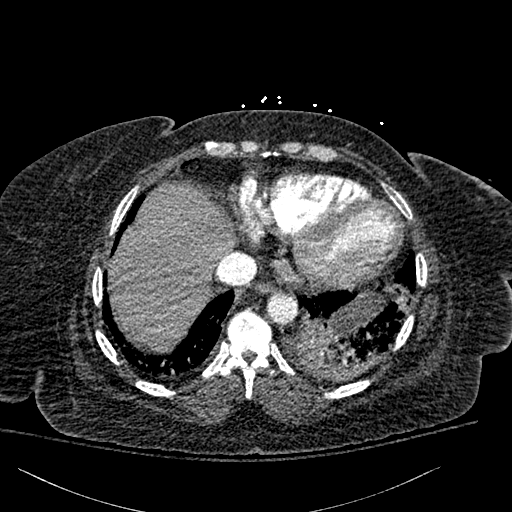
[im 66/250  lung]
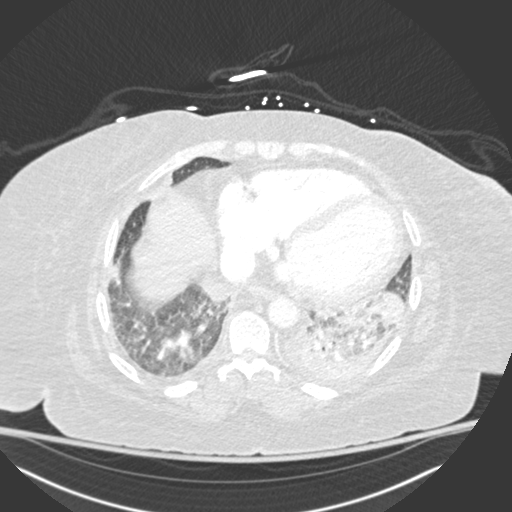
[im 79/250  mediastinal]
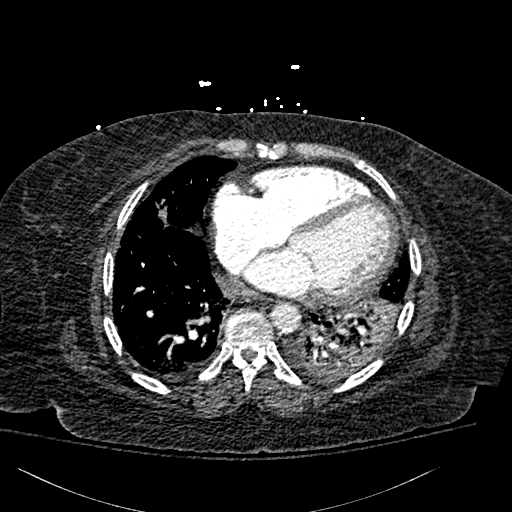
[im 92/250  lung]
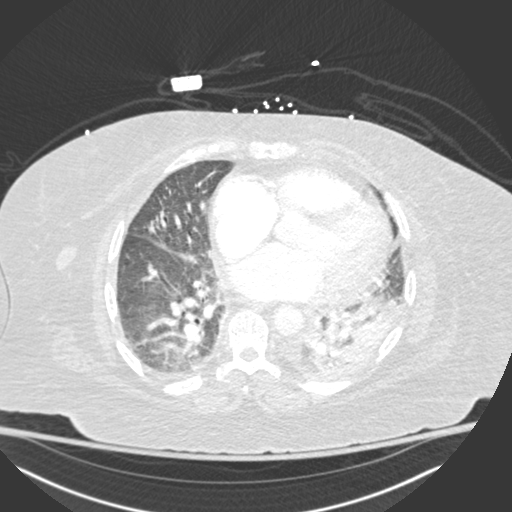
[im 105/250  mediastinal]
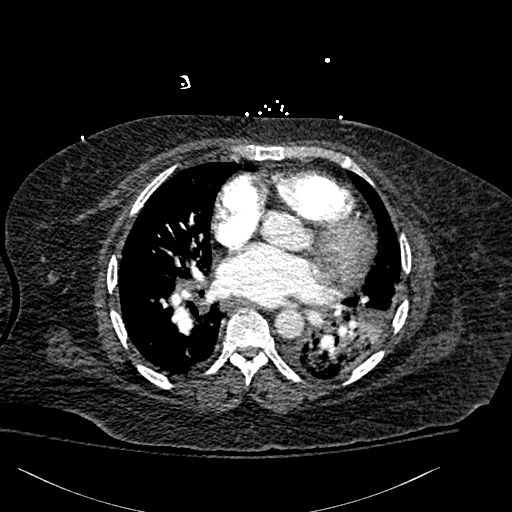
[im 132/250  lung]
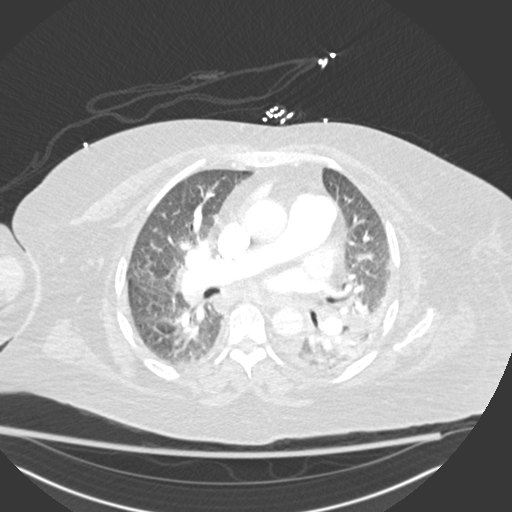
[im 145/250  mediastinal]
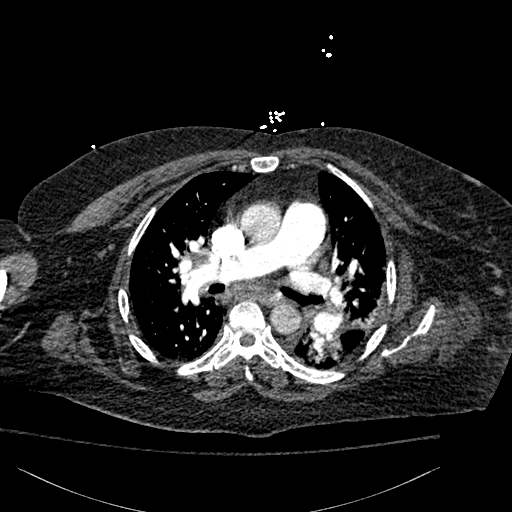
[im 158/250  lung]
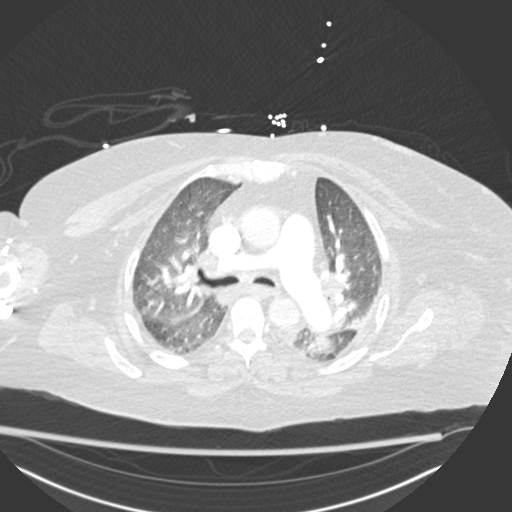
[im 171/250  mediastinal]
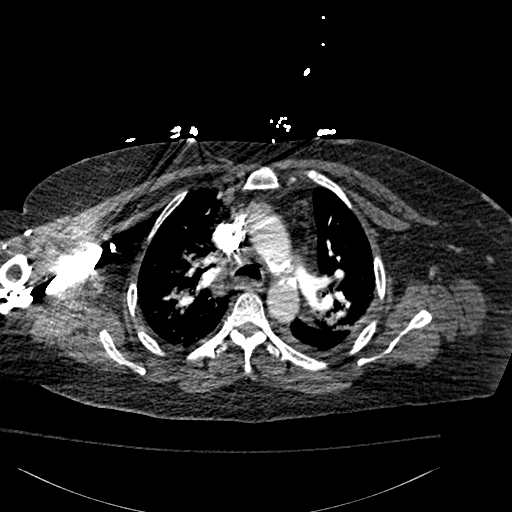
[im 184/250  lung]
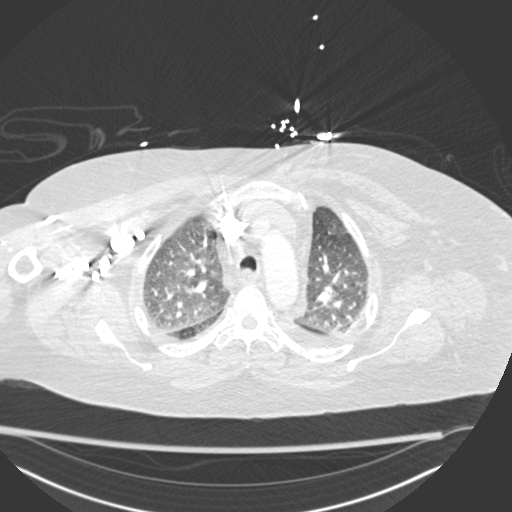
[im 197/250  mediastinal]
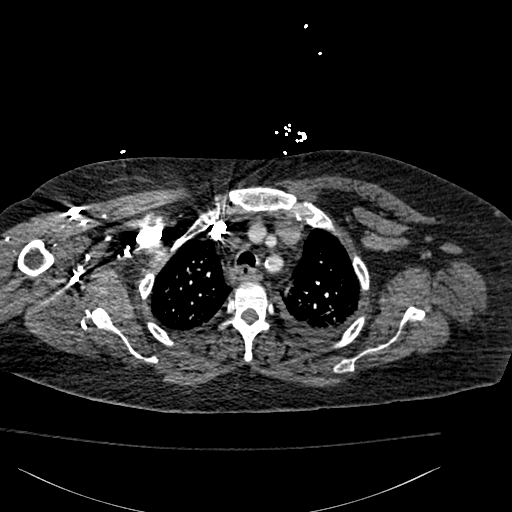
[im 210/250  lung]
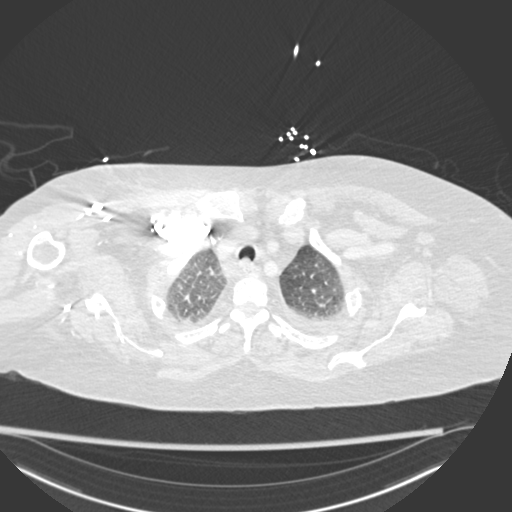
[im 223/250  mediastinal]
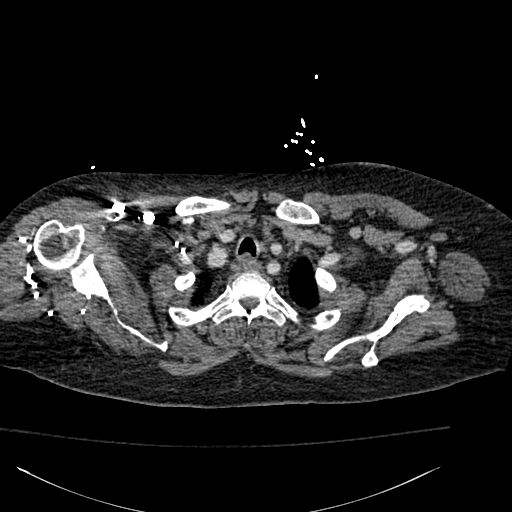
[im 236/250  lung]
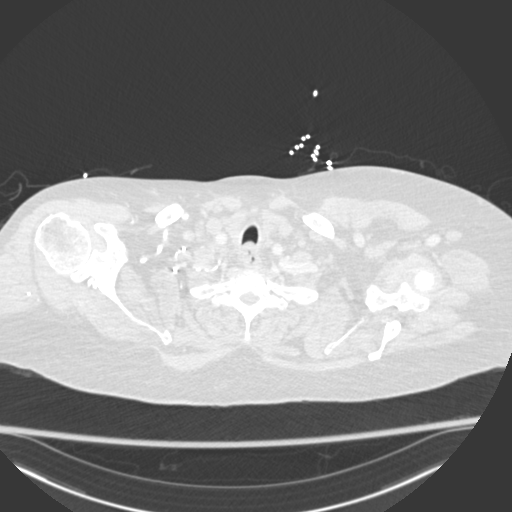

[18 of 36 positions shown; findings below may reference images not displayed]

FINDINGS: Cardiovascular: The quality of this exam for evaluation of pulmonary
embolism is moderate. Although the bolus is well timed, the exam is
generally of decreased sensitivity secondary to patient body habitus
and arm position, not raised above the head.

No pulmonary embolism to the large segmental level. Aortic
atherosclerosis. Tortuous thoracic aorta. Moderate cardiomegaly,
without pericardial effusion. Left main and 3 vessel coronary artery
calcification. Pulmonary artery enlargement, outflow tract 3.4 cm

Mediastinum/Nodes: Prominent low right jugular nodes, including at 9
mm on [DATE].

11 mm right paratracheal node on [DATE], new.

Node within the azygoesophageal recess measures 11 mm on 41/4, newly
enlarged.

Right hilar node of 1.4 cm is upper normal sized.

Lungs/Pleura: Trace left larger than right pleural effusions.

Poor inspiratory effort.

Patchy areas of ground-glass opacity and smooth septal thickening
bilaterally. More dense consolidation in the left lower lobe.

Upper Abdomen: Reflux of contrast in the IVC and hepatic veins can
be seen with elevated right heart pressures. Normal imaged portions
of the spleen, stomach.

Musculoskeletal: No acute osseous abnormality.

Review of the MIP images confirms the above findings.
IMPRESSION: 1. Multifactorial degradation, primarily patient body habitus and
arm position. No pulmonary embolism to the large segmental level.
2. Septal thickening and scattered ground-glass, likely related to
congestive heart failure, especially given bilateral pleural
effusions.
3. More focal left lower lobe consolidation, suspicious for
concurrent pneumonia.
4. Thoracic and borderline low cervical adenopathy, likely reactive
in the setting of heart failure. This could be re-evaluated with
chest CT at 6 months if desired.
5. Age advanced coronary artery atherosclerosis. Recommend
assessment of coronary risk factors and consideration of medical
therapy.
6. Pulmonary artery enlargement suggests pulmonary arterial
hypertension.

## 2023-10-14 NOTE — Procedures (Signed)
 Dialysis Progress Note   HEMODIALYSIS INTRA-PROCEDURE NOTE  PatientKal Venus Herrera was seen and examined on hemodialysis.  CHIEF COMPLAINT:  End Stage Kidney Disease  INTERVAL HISTORY:    No problems with dialysis.  She does continue to have a congested cough, with expectoration of thick mucous.  She remains on 2 L N/C oxygen. She is afebrile.  Weight has been challenged.   Current Facility-Administered Medications  Medication Dose Route Frequency Provider Last Rate Last Admin  . albumin human 25 % infusion 12.5 g  12.5 g intravenous PRN Jeanne Marie Zekan, MD      . amLODIPine  (NORVASC ) tablet 5 mg  5 mg oral Daily Mahesh Andani, MD   5 mg at 10/14/23 0809  . apixaban  (ELIQUIS ) tablet 5 mg  5 mg oral BID Stanly Oris, MD   5 mg at 10/14/23 0809  . atorvastatin  (LIPITOR ) tablet 80 mg  80 mg oral Daily Stanly Oris, MD   80 mg at 10/14/23 0809  . revefenacin (YUPELRI) 175 mcg/3 mL nebulizer solution 175 mcg  175 mcg nebulization RDaily Mahesh Andani, MD   175 mcg at 10/14/23 0815   And  . budesonide (PULMICORT) 0.5 mg/2 mL nebulizer solution 0.5 mg  0.5 mg nebulization RBID Mahesh Andani, MD   0.5 mg at 10/14/23 0815   And  . formoterol (PERFOROMIST) 20 mcg/2 mL nebulizer solution 20 mcg  20 mcg nebulization RBID Mahesh Andani, MD   20 mcg at 10/14/23 0814  . cefTRIAXone  (ROCEPHIN ) injection 2 g  2 g intravenous Q24H Mahesh Andani, MD   2 g at 10/14/23 0051  . cinacalcet  (SENSIPAR ) tablet 60 mg  60 mg oral QMWF Stanly Oris, MD   60 mg at 10/13/23 0801  . dextrose  (D50W) 50 % injection 12.5 g  12.5 g intravenous PRN Nicklaus Perri Brochure, MD      . dextrose  (GLUTOSE) 40 % oral gel 15 g  15 g oral PRN Nicklaus Perri Brochure, MD      . gabapentin  (NEURONTIN ) capsule 100 mg  100 mg oral At Bedtime Stanly Oris, MD   100 mg at 10/13/23 2023  . guaiFENesin  (MUCINEX ) 12 hr tablet 1,200 mg  1,200 mg oral BID Nnamdi Chukwuma Maduabum, MD   1,200 mg at 10/14/23 0809  . insulin  glargine  (LANTUS ) injection 14 Units  14 Units subcutaneous Daily Nnamdi Chukwuma Maduabum, MD   14 Units at 10/14/23 1000  . insulin  lispro (HumaLOG) injection 0-6 Units  0-6 Units subcutaneous With meals & at bedtime Stanly Oris, MD   1 Units at 10/13/23 0800  . levothyroxine  (SYNTHROID ) tablet 175 mcg  175 mcg oral QAM Stanly Oris, MD   175 mcg at 10/14/23 0644  . melatonin tablet 3 mg  3 mg oral QPM PRN Herlene Nian, NP   3 mg at 10/13/23 2307  . nitroglycerin (NITROSTAT) SL tablet 0.4 mg  0.4 mg sublingual Q5 Min PRN Navneet Kumar, MD   0.4 mg at 10/12/23 0500  . omeprazole (PriLOSEC) DR capsule 20 mg  20 mg oral Daily Mahesh Andani, MD   20 mg at 10/14/23 9356  . sevelamer  (RENAGEL /RENVELA ) tablet 800 mg  800 mg oral TID with meals Stanly Oris, MD   800 mg at 10/14/23 1300  . sodium chloride  (bolus) 0.9 % bolus 200 mL  200 mL intravenous PRN Jeanne Marie Zekan, MD         PHYSICAL EXAM: Vitals: Temp:  [97.1 F (36.2 C)-97.8 F (36.6 C)] 97.8 F (36.6  C) Heart Rate:  [52-81] 62 Resp:  [15-20] 18 BP: (115-141)/(51-67) 126/58  : Admission Weight: 98.7 kg (217 lb 9.5 oz) Last documented Weight: 96.6 kg (212 lb 15.4 oz) Weight change in the last 24 hours: Unable to Calculate   ASSESSMENT: General appearance: alert and no distress Lungs: diminished breath sounds bibasilar Heart: regular rate and rhythm, S1, S2 normal, no murmur, click, rub or gallop Abdomen: obese Extremities: trace edema Skin: Skin color, texture, turgor normal. No rashes or lesions  ACCESS:  AV Fistula Dialysis Access Exam: normal   LAB DATA: Data Review:   Results from last 7 days  Lab Units 10/14/23 0949 10/13/23 0205 10/12/23 0211  SODIUM mmol/L 135* 129* 134*  POTASSIUM mmol/L 3.0* 4.5 3.9  CHLORIDE mmol/L 97* 93* 96*  CO2 mmol/L 33* 23 26  BUN mg/dL 6* 50* 31*  CREATININE mg/dL 9.22 4.20* 5.08*  EGFR mL/min/1.3m2 86 8* 9*  GLUCOSE mg/dL 88 867* 837*  CALCIUM  mg/dL 8.1* 8.7 8.7   Results  from last 7 days  Lab Units 10/11/23 1540  BILIRUBIN TOTAL mg/dL 1.2*  TOTAL PROTEIN g/dL 7.4  ALT U/L 12  AST U/L 25   Results from last 7 days  Lab Units 10/12/23 0211  MAGNESIUM mg/dL 2.5   Results from last 7 days  Lab Units 10/14/23 0949 10/13/23 0205 10/12/23 0211  WHITE BLOOD CELL COUNT 10*3/uL 3.74* 7.00 12.21*  HEMOGLOBIN g/dL 88.5* 88.2* 87.9*  HEMATOCRIT % 34.4* 35.0* 36.2  PLATELET COUNT 10*3/uL 121* 106* 111*    Imaging: I reviewed imaging study No   PLAN: Ultrafiltration Goal:  3 L Patient Active Problem List   Diagnosis Date Noted  . Impaired gait and mobility 10/13/2023  . Impaired mobility and ADLs 04/18/2023  . Septic arthritis    (CMD) 04/16/2023  . Fall, initial encounter 04/15/2023  . Pyogenic arthritis of right knee joint    (CMD) 04/15/2023  . Essential hypertension 04/15/2023  . Snoring 03/03/2023  . Total, mature senile cataract 02/17/2023  . Nuclear sclerotic cataract of left eye 02/17/2023  . Chronic respiratory failure with hypoxia    (CMD) 01/20/2023  . Severe obesity (BMI 35.0-35.9 with comorbidity) (CMD) 01/20/2023  . Mild nonproliferative diabetic retinopathy of both eyes without macular edema associated with type 2 diabetes mellitus    (CMD) 12/31/2022  . Right homonymous hemianopsia due to recent cerebral infarction 12/31/2022  . Pinguecula of left eye 12/31/2022  . Cortical age-related cataract of right eye 12/31/2022  . Hyperopia with astigmatism and presbyopia, bilateral 12/31/2022  . Edema of both lower extremities 10/05/2022  . Ingrown nail 08/17/2021  . History of CVA with residual deficit 12/09/2020  . Insulin  dependent type 2 diabetes mellitus (HCC) 12/09/2020  . Acute respiratory failure with hypoxia (HCC) 12/09/2020  . Atrial fibrillation    (CMD) 12/09/2020  . Atherosclerosis of artery of both lower extremities (HCC) 10/08/2020  . Bilateral carotid artery stenosis 10/08/2020  . Completed stroke (HCC) 09/13/2020  .  Intracerebral hemorrhage (HCC) 07/29/2020  . Bradycardia 07/23/2020  . 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  . ESRD on hemodialysis    (CMD) 07/23/2020  . Acute ischemic left MCA stroke (HCC) 07/18/2020  . Cerebral embolism with cerebral infarction (HCC) 07/11/2020  . Stroke (HCC) 07/11/2020  . Sinus bradycardia 06/21/2020  . NSVT (nonsustained ventricular tachycardia) (HCC) 02/26/2020  . Type 1 diabetes mellitus with diabetic peripheral angiopathy without gangrene    (CMD) 02/14/2020  . Claudication of both lower extremities (HCC) 02/14/2020  .  Carpal tunnel syndrome of left wrist 02/12/2020  . Paresthesia of left upper extremity 01/22/2020  . Paresthesias in right hand 01/22/2020  . Carpal tunnel syndrome, bilateral 01/22/2020  . Ulnar neuropathy of both upper extremities 01/22/2020  . Diminished pulses in lower extremity 09/25/2019  . Type 2 diabetes mellitus with diabetic polyneuropathy, with long-term current use of insulin  (HCC) 09/25/2019  . Comprehensive diabetic foot examination, type 2 DM, encounter for (HCC) 09/25/2019  . Facet arthritis of cervical region 08/25/2019  . Facet arthritis of lumbar region 08/25/2019  . DDD (degenerative disc disease), lumbosacral 08/25/2019  . DDD (degenerative disc disease), cervical 08/25/2019  . Type 2 diabetes mellitus, with long-term current use of insulin     (CMD)   . Abnormal CT of the chest 12/15/2018  . Simple chronic bronchitis (HCC) 12/15/2018  . Vitamin B12 deficiency 11/21/2018  . OSA (obstructive sleep apnea) 11/21/2018  . ESRD (end stage renal disease) on dialysis (HCC) 03/14/2018  . Type 1 diabetes mellitus with kidney complication (HCC) 11/17/2017  . Obesity (BMI 30.0-34.9) 10/06/2017  . Cavitary pneumonia 07/09/2017  . Pneumonia of right lower lobe due to methicillin susceptible Staphylococcus aureus (MSSA) (HCC) 07/09/2017  . Screening for breast cancer 06/22/2014  . Essential hypertension 05/04/2014  .  Hyperlipidemia due to type 1 diabetes mellitus (HCC) 05/04/2014  . Acquired hypothyroidism 05/04/2014  . Vitamin D  deficiency 05/04/2014  . Chronic kidney disease, stage IV (severe) (HCC) 05/04/2014  . Obesity 05/04/2014    ESRD.  HD today on schedule - continuing to challenge weight.  HTN - blood pressures well controlled Anemia - Hb stable PNA - Antibiotics changed to PO - Omnicef dose in a dialysis patient is once a day to be dosed after dialysis.

## 2023-10-14 NOTE — Discharge Summary (Addendum)
 Hospital Medicine Discharge Summary   Demographics: Rachael Herrera  63 y.o. Jul 11, 1960 MRN: [id number]    Extended Emergency Contact Information Primary Emergency Contact: Santana,Doris Mobile Phone: [phone] Relation: Daughter  Full Code  Admit Date: 10/11/2023                            Attending Physician: Jennings Graciela Keeler* Discharge Date: 10/14/2023  Primary Care Provider: TYLENE NICHOLAUS GARTNER, DO   [phone]  Consults during this admission: Consult Orders             IP CONSULT TO HOSPITALIST       Provider:  (Not yet assigned)      IP CONSULT TO NEPHROLOGY       Specialty:  Nephrology  Provider:  (Not yet assigned)              Active & Resolved Diagnosis: Principal Problem (Resolved):   Acute on chronic hypoxic respiratory failure    (CMD) Active Problems:   Impaired gait and mobility   Disposition: Patient discharged to Home with Home Health Care in fair condition.   Discharge follow-up recommendations : See Hospital Course  Scheduled Future Appointments       Provider Department Dept Phone Center   11/03/2023 2:00 PM [name] [facility]  - Neurology [name] [phone] [facility] [address]   03/01/2024 11:00 AM [name] [facility]  - Pulmonary [place] [phone] [facility] Hospital Course: This is a 63 year old lady with a PMH significant for ESRD-HD-TTS, CVA with residual rt-sided weakness and aphasia, seizure disorder, chronic hypoxic respiratory failure on 2 L nasal cannula, HTN, hypothyroidism, OSA, A-fib on Eliquis , brought to the ED due to shortness of breath, diagnosed with fluid overload requiring dialysis.   # Acute on chronic hypoxic respiratory failure 2/2 fluid overload - Baseline 2L [place] - Requiring up to 6L [place] on admission -Now back to 2L [place] -Chest imaging showed vascular congestion with bilateral pleural effusion left worse than right - Initially  required BiPAP but is now weaned down to 2L [place] saturating at about 98% - According to the patient's daughter she was back to her baseline respiratory status following 3 L out at dialysis on the night of presentation   # ESRD-HD-TTS - Dialysis resumed: Dialyzed on Monday and Wednesday and Thursday so far - Continue home meds   # CAP - On azithromycin  and ceftriaxone  - Leukocytosis has resolved -Discharge on cefdinir to complete the course.   # Mild atelectasis - Incentive spirometry    History of left MCA and distal left PCA infarction-with residual aphasia and right-sided weakness.  Code stroke was called this evening when patient was in dialysis, nurse was concerned about worsening right-sided weakness and speech, underwent CT head and CTA head and neck noted chronic infarction, no new changes.  Discussed with teleneurologist, no change in management, likely recrudescence of previous stroke.  If patient's symptoms get worse tomorrow consider MRI after otherwise continue  same treatment.  Continue Eliquis  - Symptoms are not any worse, she is at her baseline requesting to get in the recliner -She is reportedly not bedbound -Ambulation to chair with assist of the nurse and daughter requested, PT OT consulted -PT OT recommends SNF, home health orders are in place in case her daughter refuses   Essential hypertension continue amlodipine  COPD continue her home inhaler Insulin -dependent type  2 diabetes mellitus-patient is very sensitive to insulin , resume her home dose of sliding scale and Lantus  10 units, subsequently increased to 16 units     History of A-fib currently rate controlled continue Eliquis .  Hypothyroidism continue levothyroxine  History of sleep apnea supposed to be on CPAP but daughter says she has not been on anything.  Keep her here on CPAP   On the day of discharge patient was seen in dialysis quite sleepy like the day before, but again like today before she was alert a few  hours after.  She has been coughing up brownish sputum, some streaks of blood.  Started on guaifenesin .  I called and discussed her care with her daughter over the phone who will be here shortly to get the patient.    05/22 0941 Note By: Rudolph Earnie Shackle, MD 05/21 0945 Note By: Rudolph Earnie Shackle, MD 05/19 2122 Note By: Rudolph Earnie Shackle, MD          Wound / Incision Assessment: Refer to Chart Review and Media Tab for images if available.   Wound 04/15/23 Pretibial Proximal;Right (Active)  Date First Assessed/Time First Assessed: 04/15/23 1222   Pre-Existing Wound: Yes  Location: Pretibial  Wound Location Orientation: Proximal;Right  Wound Description (Comments): stage 2, wound draining slightly.    Temp:  [97.1 F (36.2 C)-97.8 F (36.6 C)] 97.8 F (36.6 C) Heart Rate:  [52-81] 62 Resp:  [15-20] 18 BP: (115-141)/(51-67) 126/58  Anticoagulant Medications     Direct Factor Xa Inhibitors Start End   * apixaban  (ELIQUIS ) 5 mg tab 07/10/2021 --   Class: Historical Med         Discharge Medications     PAUSE taking these medications      Sig Disp Refill Start End  amitriptyline  25 mg tablet Wait to take this until your doctor or other care provider tells you to start again. Commonly known as: ELAVIL   Take 1 tablet (25 mg total) by mouth nightly. HOLD due to possible SIADH hyponatremia. Can resume once sodium levels are stable.   0         New Medications      Sig Disp Refill Start End  amLODIPine  10 mg tablet Commonly known as: NORVASC   Take 0.5 tablets (5 mg total) by mouth daily.   0     cefdinir 300 mg capsule Commonly known as: OMNICEF  Take 1 capsule (300 mg total) by mouth every evening for 3 days. On dialysis days, be sure to take after dialysis  3 capsule  0     guaiFENesin  600 mg 12 hr tablet Commonly known as: MUCINEX   Take 2 tablets (1,200 mg total) by mouth 2 (two) times a day for 5 days.  20 tablet  0         Medications To  Continue      Sig Disp Refill Start End  apixaban  5 mg Tab Commonly known as: ELIQUIS   Take 5 mg by mouth 2 (two) times a day.   0     atorvastatin  80 mg tablet Commonly known as: LIPITOR   Take 80 mg by mouth at bedtime.   0     cinacalcet  30 mg tablet Commonly known as: SENSIPAR   Take 60 mg by mouth every Monday, Wednesday and Friday.   0     diclofenac sodium 1 % gel Commonly known as: VOLTAREN  Apply 1 g topically daily as needed. Knee pain   0     doxepin 3  mg Tab tablet Commonly known as: SILENOR  Take 3 mg by mouth nightly.   0     gabapentin  100 mg capsule Commonly known as: NEURONTIN   Take 1 capsule (100 mg total) by mouth at bedtime.   0     HumaLOG U-100 Insulin  100 unit/mL injection Generic drug: insulin  lispro  Inject under the skin 3 (three) times a day before meals. Injects according to sliding scale   0     insulin  glargine 100 unit/mL injection Commonly known as: LANTUS   Inject 10 Units under the skin daily.   0     levothyroxine  175 mcg tablet Commonly known as: SYNTHROID   Take 1 tablet (175 mcg total) by mouth every morning.  90 tablet  0     omeprazole 20 mg DR capsule Commonly known as: PriLOSEC  Take 20 mg by mouth daily.   0     OneTouch Verio test strips test strip Generic drug: glucose blood  Use 1 test strip to check blood sugar 3 times daily  100 strip  2     sevelamer  carbonate 800 mg tablet Commonly known as: RENVELA   Take 800 mg by mouth in the morning and 800 mg at noon and 800 mg in the evening. Take with meals.   0     Trelegy Ellipta 100-62.5-25 mcg inhaler Generic drug: fluticasone-umeclidinium-vilanterol  Inhale 1 puff daily.   0         Discharge Orders     Physical Therapy Home Health Coordination     Details:    Actions: Evaluate and Treat       Occupational Therapy Recommendations: Rehab Potential: Good Complexity/Co-morbidities that Impact POC: Cognitive/Memory deficits, Reduced activity  level Impairments/Limitations: Coordination Deficits, Range of motion deficits, Activity Tolerance Deficits, Decreased knowledge of condition, Mobility deficits, Safety Awareness deficits, Balance Deficits, Muscle weakness, Basic Activity of Daily living deficits, Fine Motor deficits, Cognitive Deficits, Gross Motor deficits   Physical Therapy Recommendations: Rehabilitation Facility     Lab Results  Component Value Date/Time   HGB 11.4 (L) 10/14/2023 09:49 AM   HCT 34.4 (L) 10/14/2023 09:49 AM   WBC 3.74 (L) 10/14/2023 09:49 AM   PLT 121 (L) 10/14/2023 09:49 AM   Lab Results  Component Value Date/Time   NA 135 (L) 10/14/2023 09:49 AM   K 3.0 (L) 10/14/2023 09:49 AM   CREATININE 0.77 10/14/2023 09:49 AM   BUN 6 (L) 10/14/2023 09:49 AM   GLUCOSE 88 10/14/2023 09:49 AM    Pertinent Imaging: XR Chest 1 View  Final Result by Delmar Herbert Winfred Booker Results In [place] [id number] (05/19 1752)  CLINICAL DATA:  Shortness of breath.    EXAM:  CHEST  1 VIEW    COMPARISON:  06/27/2023    FINDINGS:  Patient's chin obscures the apices. Left lung base opacity likely  combination of pleural fluid and airspace disease. There is a right  pleural effusion with patchy opacity at the right lung base. Small  amount of fluid in the right minor fissure. The heart is enlarged.  Central vascular congestion. No pneumothorax.    IMPRESSION:  1. Cardiomegaly with central vascular congestion.  2. Bilateral pleural effusions, left greater than right. Bibasilar  opacities may represent atelectasis or pneumonia.      Electronically Signed    By: Andrea Gasman M.D.    On: 10/11/2023 17:52        Electronically signed by: Jennings Graciela Keeler, MD 10/14/2023 4:31 PM   Time spent on  discharge: 40 minutes  *Some images could not be shown.

## 2024-01-02 NOTE — Progress Notes (Signed)
 Patient: Rachael Herrera DOB: [birth year] MRN: [id number] Note Type: Dialysis Rounds-Comp Attestation Service Date: 01/01/2024  This patient was seen face-to-face for a complete visit as part of routine monthly dialysis care for end stage renal disease. Documentation of the visit is present in the dialysis unit or other medical record system.  Attending Nephrologist: ZEKAN, JEANNE M Dialysis Location: [facility] Schedule: Tu-Th-Sa Shift: 1  ------------------------------ ADDITIONAL COMMENT ------------------------------  COMMENTS: Patient is seen at dialysis and has no complaints. No problems with dialysis. Labs reviewed with patient and no changes made. Please see time V7 note.   Signed by: ZEKAN, JEANNE M, MD on 01/02/2024 at 03:11:17 PM Transcribed by: ZEKAN, JEANNE M, MD on 01/02/2024 at 03:11:17 PM
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
## 2024-02-02 NOTE — Progress Notes (Signed)
 Vascular Surgery Office Note  02/02/2024  PCP: TYLENE NICHOLAUS GARTNER, DO  Chief Complaint  Patient presents with  . 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 Tina in 2020.  Subjective:  Rachael Herrera is a 63 y.o. female with PMH significant for HTN, HLD, a fib on Eliquis , DM, CAD, ESRD on dialysis, and PVD who presents to the clinic today for routine follow up, but also as referral from dialysis center due to aneurysmal fistula which was placed in 2020. She is here with her daughter Rachael Herrera who cares for her.  Doing relatively well. Fistula is working well but center having to find alternate stick sites due to aneurysm formation. They have been trying to go above the aneurysm but not very successfully. More recently they have been accessing from the sides of the aneurysm. No prolonged bleeding or pain.  She does not walk much but occasionally gets herself around the house. She has no complaints of claudication. Denies rest pain and tissue loss.   She is on Eliquis  and statin.   ROS: -No fevers, chills, or sweats -No chest pain or shortness of breath -No new complaints -As above in HPI  Objective:  Vital signs in last 24 hours: Heart Rate:  [51-52] 52 BP: (139-144)/(67-72) 139/72   Vascular study results: I have personally reviewed vascular studies performed in our office.  Current ABI: Non-compressible pedal arteries. Toe pressure 74 on right and 80 on left.   Dialysis duplex with widely patent fistula but areas of aneurysm ranging from 1.5cm- 3.0cm.  Results were discussed with patient during today's visit.  Labs: Lab Results  Component Value Date   BUN 6 (L) 10/14/2023   CO2 33 (H) 10/14/2023   CREATININE 0.77 10/14/2023   HCT 34.4 (L) 10/14/2023   HGB 11.4 (L) 10/14/2023   PLT 121 (L)  10/14/2023   K 3.0 (L) 10/14/2023   WBC 3.74 (L) 10/14/2023    Physical Exam: Vitals:   02/02/24 1011  BP: 139/72  Pulse: 52     Constitutional: No acute distress, well appearing female sitting comfortably in wheelchair. Skin: No rashes, ulcer, stasis dermatitis or skin breakdown.  Respiratory: No increased work of breathing or signs of respiratory distress.  Cardiovascular: Regular rate and rhythm, Right PT and DP pulse 2+, Left DP pulse 2+. Left PT pulses non-palpable with monophasic signal. Left AVF with palpable thrill. Mild atrophy of skin overlying aneurysm but without ulcer or shininess. Skin is mobile over vein.  MSK/Neuro: No gross abnormalities. Psychiatric: Oriented to person, place, and time. Mood and affect are appropriate.   Assessment/Plan: 63 y.o. female    1. PAD (peripheral artery disease)   2. Insulin  dependent type 2 diabetes mellitus (HCC)   3. Essential hypertension   4. Hyperlipidemia due to type 1 diabetes mellitus (HCC)   5. ESRD (end stage renal disease) on dialysis    (CMD)      PAD: -Patient doing well with stable labs and symptoms. No intervention indicated at this time.  - Repeat in 6 months.  ESRD with aneurysmal fistula: -Discussed with Dr. Tina who recommends surgical resection of aneurysm. Reviewed risks, benefits, and alternatives to this recommendation. Patient understands and wishes to proceed. Will plan on OR on 03/06/2024. Consent will need to be obtained on day of surgery.  -She will need to  have a permcath placed by IR prior to surgery. Order has been placed.  -Hold Eliquis  for 2 days before both permcath placement and surgery. Instructions given to [facility] over the phone.   Continue taking current medications. Discussed the importance of smoking cessation, diabetes management, blood pressure and cholesterol control, and regular exercise to optimally manage vascular disease. Patient knows to follow up with PCP for monitoring of these  chronic comorbidities. Return to clinic after above vascular studies.   Electronically signed by: Leita MARLA Share, PA-C 02/02/2024 11:19 AM

## 2024-02-08 NOTE — Progress Notes (Signed)
------------------------------   BASIC NOTE ------------------------------  Patient: Rachael Herrera DOB: 03-27-1961 MRN: [id number] Note Author: ZEKAN, JEANNE M, MD Service Date: 02/08/2024  This patient was seen face-to-face for a basic visit as part of routine monthly dialysis care for end stage renal disease. Documentation of the visit is present in the dialysis unit or other medical record system.  Attending Nephrologist: ZEKAN, JEANNE M Dialysis Location: [facility] Schedule: Tu-Th-Sa Shift: 1  ------------------------------ ADDITIONAL COMMENT ------------------------------  COMMENTS: Patient is seen at dialysis and has no complaints. No problems with dialysis.   Signed by: ZEKAN, JEANNE M, MD on 02/08/2024 at 93:81:42 PM Transcribed by: ZEKAN, JEANNE M, MD on 02/08/2024 at 06:18:57 PM

## 2024-02-15 NOTE — ED Triage Notes (Signed)
 Rectal bleeding that started last week. On blood thinners. Denies weakness. Lower abdominal pain. Pt is at baseline, hx stroke.

## 2024-02-15 NOTE — ED Provider Notes (Signed)
 Patient placed in First Look pathway, seen and evaluated for chief complaint of 1 week of rectal bleeding.  Mix of bright red and dark red.  On blood thinners.  No history of this in the past.  Notes pain in the lower abdomen.  History of stroke and has difficulties communication since.  No acute changes in communication today..   Pertinent exam findings include non-toxic in appearance. Based on initial evaluation, labs are currently indicated and radiology studies are not currently indicated as allowed for current processes and treatments as applicable in a triage setting and could be different than if patient were seen in a main treatment area or dependent on labs/imagining after results are displayed.  Patient counseled on process, plan, and necessity for staying for completing the evaluation.   This document serves as a record of services personally performed by Gillis, Luke PA-C

## 2024-02-16 ENCOUNTER — Other Ambulatory Visit: Payer: Self-pay

## 2024-02-16 ENCOUNTER — Encounter (HOSPITAL_BASED_OUTPATIENT_CLINIC_OR_DEPARTMENT_OTHER): Payer: Self-pay | Admitting: Emergency Medicine

## 2024-02-16 ENCOUNTER — Inpatient Hospital Stay (HOSPITAL_BASED_OUTPATIENT_CLINIC_OR_DEPARTMENT_OTHER)
Admission: EM | Admit: 2024-02-16 | Discharge: 2024-02-22 | DRG: 377 | Disposition: A | Discharge status: Home Health | Attending: Internal Medicine | Admitting: Internal Medicine

## 2024-02-16 DIAGNOSIS — Z992 Dependence on renal dialysis: Secondary | ICD-10-CM | POA: Diagnosis not present

## 2024-02-16 DIAGNOSIS — F411 Generalized anxiety disorder: Secondary | ICD-10-CM | POA: Diagnosis present

## 2024-02-16 DIAGNOSIS — D696 Thrombocytopenia, unspecified: Secondary | ICD-10-CM | POA: Diagnosis present

## 2024-02-16 DIAGNOSIS — Z833 Family history of diabetes mellitus: Secondary | ICD-10-CM

## 2024-02-16 DIAGNOSIS — E66812 Obesity, class 2: Secondary | ICD-10-CM | POA: Diagnosis present

## 2024-02-16 DIAGNOSIS — E119 Type 2 diabetes mellitus without complications: Secondary | ICD-10-CM

## 2024-02-16 DIAGNOSIS — Z7901 Long term (current) use of anticoagulants: Secondary | ICD-10-CM

## 2024-02-16 DIAGNOSIS — D72810 Lymphocytopenia: Secondary | ICD-10-CM | POA: Diagnosis not present

## 2024-02-16 DIAGNOSIS — D72819 Decreased white blood cell count, unspecified: Secondary | ICD-10-CM | POA: Diagnosis present

## 2024-02-16 DIAGNOSIS — F05 Delirium due to known physiological condition: Secondary | ICD-10-CM | POA: Diagnosis not present

## 2024-02-16 DIAGNOSIS — G609 Hereditary and idiopathic neuropathy, unspecified: Secondary | ICD-10-CM

## 2024-02-16 DIAGNOSIS — K5521 Angiodysplasia of colon with hemorrhage: Secondary | ICD-10-CM | POA: Diagnosis present

## 2024-02-16 DIAGNOSIS — N186 End stage renal disease: Secondary | ICD-10-CM | POA: Diagnosis present

## 2024-02-16 DIAGNOSIS — D62 Acute posthemorrhagic anemia: Secondary | ICD-10-CM | POA: Diagnosis present

## 2024-02-16 DIAGNOSIS — K5731 Diverticulosis of large intestine without perforation or abscess with bleeding: Secondary | ICD-10-CM | POA: Diagnosis present

## 2024-02-16 DIAGNOSIS — N202 Calculus of kidney with calculus of ureter: Secondary | ICD-10-CM | POA: Diagnosis present

## 2024-02-16 DIAGNOSIS — K922 Gastrointestinal hemorrhage, unspecified: Secondary | ICD-10-CM | POA: Diagnosis not present

## 2024-02-16 DIAGNOSIS — E7849 Other hyperlipidemia: Secondary | ICD-10-CM

## 2024-02-16 DIAGNOSIS — E1151 Type 2 diabetes mellitus with diabetic peripheral angiopathy without gangrene: Secondary | ICD-10-CM | POA: Diagnosis present

## 2024-02-16 DIAGNOSIS — Z794 Long term (current) use of insulin: Secondary | ICD-10-CM

## 2024-02-16 DIAGNOSIS — D6832 Hemorrhagic disorder due to extrinsic circulating anticoagulants: Secondary | ICD-10-CM | POA: Diagnosis present

## 2024-02-16 DIAGNOSIS — G4733 Obstructive sleep apnea (adult) (pediatric): Secondary | ICD-10-CM | POA: Diagnosis present

## 2024-02-16 DIAGNOSIS — E11649 Type 2 diabetes mellitus with hypoglycemia without coma: Secondary | ICD-10-CM | POA: Diagnosis present

## 2024-02-16 DIAGNOSIS — E1165 Type 2 diabetes mellitus with hyperglycemia: Secondary | ICD-10-CM | POA: Diagnosis present

## 2024-02-16 DIAGNOSIS — Z6837 Body mass index (BMI) 37.0-37.9, adult: Secondary | ICD-10-CM

## 2024-02-16 DIAGNOSIS — G629 Polyneuropathy, unspecified: Secondary | ICD-10-CM

## 2024-02-16 DIAGNOSIS — K5730 Diverticulosis of large intestine without perforation or abscess without bleeding: Secondary | ICD-10-CM | POA: Diagnosis not present

## 2024-02-16 DIAGNOSIS — E1142 Type 2 diabetes mellitus with diabetic polyneuropathy: Secondary | ICD-10-CM | POA: Diagnosis present

## 2024-02-16 DIAGNOSIS — I69322 Dysarthria following cerebral infarction: Secondary | ICD-10-CM | POA: Diagnosis not present

## 2024-02-16 DIAGNOSIS — E1122 Type 2 diabetes mellitus with diabetic chronic kidney disease: Secondary | ICD-10-CM | POA: Diagnosis present

## 2024-02-16 DIAGNOSIS — K219 Gastro-esophageal reflux disease without esophagitis: Secondary | ICD-10-CM | POA: Diagnosis present

## 2024-02-16 DIAGNOSIS — D631 Anemia in chronic kidney disease: Secondary | ICD-10-CM | POA: Diagnosis present

## 2024-02-16 DIAGNOSIS — I10 Essential (primary) hypertension: Secondary | ICD-10-CM | POA: Diagnosis present

## 2024-02-16 DIAGNOSIS — N2581 Secondary hyperparathyroidism of renal origin: Secondary | ICD-10-CM | POA: Diagnosis present

## 2024-02-16 DIAGNOSIS — T45515A Adverse effect of anticoagulants, initial encounter: Secondary | ICD-10-CM | POA: Diagnosis present

## 2024-02-16 DIAGNOSIS — K625 Hemorrhage of anus and rectum: Principal | ICD-10-CM

## 2024-02-16 DIAGNOSIS — G9341 Metabolic encephalopathy: Secondary | ICD-10-CM | POA: Diagnosis not present

## 2024-02-16 DIAGNOSIS — I480 Paroxysmal atrial fibrillation: Secondary | ICD-10-CM | POA: Diagnosis present

## 2024-02-16 DIAGNOSIS — I120 Hypertensive chronic kidney disease with stage 5 chronic kidney disease or end stage renal disease: Secondary | ICD-10-CM | POA: Diagnosis present

## 2024-02-16 DIAGNOSIS — J45909 Unspecified asthma, uncomplicated: Secondary | ICD-10-CM | POA: Insufficient documentation

## 2024-02-16 DIAGNOSIS — E10649 Type 1 diabetes mellitus with hypoglycemia without coma: Secondary | ICD-10-CM | POA: Diagnosis not present

## 2024-02-16 DIAGNOSIS — I739 Peripheral vascular disease, unspecified: Secondary | ICD-10-CM | POA: Diagnosis not present

## 2024-02-16 DIAGNOSIS — I639 Cerebral infarction, unspecified: Secondary | ICD-10-CM | POA: Diagnosis not present

## 2024-02-16 DIAGNOSIS — E039 Hypothyroidism, unspecified: Secondary | ICD-10-CM | POA: Diagnosis present

## 2024-02-16 DIAGNOSIS — Z8249 Family history of ischemic heart disease and other diseases of the circulatory system: Secondary | ICD-10-CM

## 2024-02-16 DIAGNOSIS — J9611 Chronic respiratory failure with hypoxia: Secondary | ICD-10-CM | POA: Diagnosis present

## 2024-02-16 DIAGNOSIS — Z79890 Hormone replacement therapy (postmenopausal): Secondary | ICD-10-CM

## 2024-02-16 DIAGNOSIS — I472 Ventricular tachycardia, unspecified: Secondary | ICD-10-CM | POA: Diagnosis present

## 2024-02-16 DIAGNOSIS — E785 Hyperlipidemia, unspecified: Secondary | ICD-10-CM | POA: Diagnosis present

## 2024-02-16 DIAGNOSIS — Z8679 Personal history of other diseases of the circulatory system: Secondary | ICD-10-CM | POA: Diagnosis not present

## 2024-02-16 DIAGNOSIS — I69391 Dysphagia following cerebral infarction: Secondary | ICD-10-CM

## 2024-02-16 DIAGNOSIS — K640 First degree hemorrhoids: Secondary | ICD-10-CM | POA: Diagnosis present

## 2024-02-16 DIAGNOSIS — I2510 Atherosclerotic heart disease of native coronary artery without angina pectoris: Secondary | ICD-10-CM | POA: Diagnosis present

## 2024-02-16 DIAGNOSIS — K644 Residual hemorrhoidal skin tags: Secondary | ICD-10-CM | POA: Diagnosis present

## 2024-02-16 DIAGNOSIS — K920 Hematemesis: Secondary | ICD-10-CM | POA: Diagnosis not present

## 2024-02-16 DIAGNOSIS — Z79899 Other long term (current) drug therapy: Secondary | ICD-10-CM

## 2024-02-16 LAB — COMPREHENSIVE METABOLIC PANEL WITH GFR
ALT: 11 U/L (ref 0–44)
AST: 22 U/L (ref 15–41)
Albumin: 3.7 g/dL (ref 3.5–5.0)
Alkaline Phosphatase: 134 U/L — ABNORMAL HIGH (ref 38–126)
Anion gap: 13 (ref 5–15)
BUN: 25 mg/dL — ABNORMAL HIGH (ref 8–23)
CO2: 27 mmol/L (ref 22–32)
Calcium: 9.5 mg/dL (ref 8.9–10.3)
Chloride: 102 mmol/L (ref 98–111)
Creatinine, Ser: 4.61 mg/dL — ABNORMAL HIGH (ref 0.44–1.00)
GFR, Estimated: 10 mL/min — ABNORMAL LOW (ref >60)
Glucose, Bld: 153 mg/dL — ABNORMAL HIGH (ref 70–99)
Potassium: 4.2 mmol/L (ref 3.5–5.1)
Sodium: 142 mmol/L (ref 135–145)
Total Bilirubin: 0.9 mg/dL (ref 0.0–1.2)
Total Protein: 6.5 g/dL (ref 6.5–8.1)

## 2024-02-16 LAB — CBC
HCT: 26.9 % — ABNORMAL LOW (ref 36.0–46.0)
Hemoglobin: 8.2 g/dL — ABNORMAL LOW (ref 12.0–15.0)
MCH: 29.4 pg (ref 26.0–34.0)
MCHC: 30.5 g/dL (ref 30.0–36.0)
MCV: 96.4 fL (ref 80.0–100.0)
Platelets: 105 K/uL — ABNORMAL LOW (ref 150–400)
RBC: 2.79 MIL/uL — ABNORMAL LOW (ref 3.87–5.11)
RDW: 16.6 % — ABNORMAL HIGH (ref 11.5–15.5)
WBC: 3.4 K/uL — ABNORMAL LOW (ref 4.0–10.5)
nRBC: 0 % (ref 0.0–0.2)

## 2024-02-16 LAB — CBC WITH DIFFERENTIAL/PLATELET
Abs Immature Granulocytes: 0.02 K/uL (ref 0.00–0.07)
Basophils Absolute: 0 K/uL (ref 0.0–0.1)
Basophils Relative: 1 %
Eosinophils Absolute: 0.1 K/uL (ref 0.0–0.5)
Eosinophils Relative: 3 %
HCT: 29.3 % — ABNORMAL LOW (ref 36.0–46.0)
Hemoglobin: 9.2 g/dL — ABNORMAL LOW (ref 12.0–15.0)
Immature Granulocytes: 1 %
Lymphocytes Relative: 24 %
Lymphs Abs: 0.9 K/uL (ref 0.7–4.0)
MCH: 29.9 pg (ref 26.0–34.0)
MCHC: 31.4 g/dL (ref 30.0–36.0)
MCV: 95.1 fL (ref 80.0–100.0)
Monocytes Absolute: 0.4 K/uL (ref 0.1–1.0)
Monocytes Relative: 12 %
Neutro Abs: 2.2 K/uL (ref 1.7–7.7)
Neutrophils Relative %: 59 %
Platelets: 107 K/uL — ABNORMAL LOW (ref 150–400)
RBC: 3.08 MIL/uL — ABNORMAL LOW (ref 3.87–5.11)
RDW: 16.8 % — ABNORMAL HIGH (ref 11.5–15.5)
WBC: 3.7 K/uL — ABNORMAL LOW (ref 4.0–10.5)
nRBC: 0 % (ref 0.0–0.2)

## 2024-02-16 LAB — PROTIME-INR
INR: 1.4 — ABNORMAL HIGH (ref 0.8–1.2)
Prothrombin Time: 18 s — ABNORMAL HIGH (ref 11.4–15.2)

## 2024-02-16 LAB — HIV ANTIBODY (ROUTINE TESTING W REFLEX): HIV Screen 4th Generation wRfx: REACTIVE — AB

## 2024-02-16 LAB — HEMOGLOBIN A1C
Hgb A1c MFr Bld: 8 % — ABNORMAL HIGH (ref 4.8–5.6)
Mean Plasma Glucose: 182.9 mg/dL

## 2024-02-16 LAB — ABO/RH: ABO/RH(D): O POS

## 2024-02-16 LAB — HEMOGLOBIN AND HEMATOCRIT, BLOOD
HCT: 29.4 % — ABNORMAL LOW (ref 36.0–46.0)
Hemoglobin: 9 g/dL — ABNORMAL LOW (ref 12.0–15.0)

## 2024-02-16 LAB — PREPARE RBC (CROSSMATCH)

## 2024-02-16 LAB — GLUCOSE, CAPILLARY: Glucose-Capillary: 227 mg/dL — ABNORMAL HIGH (ref 70–99)

## 2024-02-16 MED ORDER — INSULIN ASPART 100 UNIT/ML IJ SOLN
0.0000 [IU] | Freq: Every day | INTRAMUSCULAR | Status: DC
  Administered 2024-02-16: 2 [IU] via SUBCUTANEOUS

## 2024-02-16 MED ORDER — SODIUM CHLORIDE 0.9% FLUSH: 3.0000 mL | INTRAVENOUS | Status: DC | PRN

## 2024-02-16 MED ORDER — SEVELAMER CARBONATE 800 MG PO TABS
800.0000 mg | ORAL_TABLET | Freq: Three times a day (TID) | ORAL | Status: DC
  Administered 2024-02-16 – 2024-02-22 (×15): 800 mg via ORAL
  Filled 2024-02-16 (×17): qty 1

## 2024-02-16 MED ORDER — CINACALCET HCL 30 MG PO TABS
30.0000 mg | ORAL_TABLET | Freq: Every day | ORAL | Status: DC
  Administered 2024-02-17 – 2024-02-22 (×5): 30 mg via ORAL
  Filled 2024-02-16 (×8): qty 1

## 2024-02-16 MED ORDER — PANTOPRAZOLE SODIUM 40 MG IV SOLR
40.0000 mg | Freq: Two times a day (BID) | INTRAVENOUS | Status: DC
  Administered 2024-02-16 – 2024-02-21 (×11): 40 mg via INTRAVENOUS
  Filled 2024-02-16 (×11): qty 10

## 2024-02-16 MED ORDER — ONDANSETRON HCL 4 MG PO TABS: 4.0000 mg | ORAL_TABLET | Freq: Four times a day (QID) | ORAL | Status: DC | PRN

## 2024-02-16 MED ORDER — ONDANSETRON HCL 4 MG/2ML IJ SOLN: 4.0000 mg | Freq: Four times a day (QID) | INTRAMUSCULAR | Status: DC | PRN

## 2024-02-16 MED ORDER — INSULIN GLARGINE 100 UNIT/ML ~~LOC~~ SOLN
10.0000 [IU] | Freq: Every day | SUBCUTANEOUS | Status: DC
  Administered 2024-02-17: 10 [IU] via SUBCUTANEOUS
  Filled 2024-02-16 (×2): qty 0.1

## 2024-02-16 MED ORDER — SODIUM CHLORIDE 0.9% FLUSH
3.0000 mL | Freq: Two times a day (BID) | INTRAVENOUS | Status: DC
  Administered 2024-02-16 – 2024-02-21 (×11): 3 mL via INTRAVENOUS

## 2024-02-16 MED ORDER — ACETAMINOPHEN 325 MG PO TABS
650.0000 mg | ORAL_TABLET | Freq: Four times a day (QID) | ORAL | Status: DC | PRN
  Administered 2024-02-18 – 2024-02-21 (×3): 650 mg via ORAL
  Filled 2024-02-16 (×4): qty 2

## 2024-02-16 MED ORDER — GABAPENTIN 300 MG PO CAPS
300.0000 mg | ORAL_CAPSULE | Freq: Every day | ORAL | Status: DC
  Administered 2024-02-16 – 2024-02-21 (×6): 300 mg via ORAL
  Filled 2024-02-16 (×6): qty 1

## 2024-02-16 MED ORDER — SODIUM CHLORIDE 0.9% FLUSH
3.0000 mL | Freq: Two times a day (BID) | INTRAVENOUS | Status: DC
  Administered 2024-02-16 – 2024-02-21 (×10): 3 mL via INTRAVENOUS

## 2024-02-16 MED ORDER — SODIUM CHLORIDE 0.9% IV SOLUTION: Freq: Once | INTRAVENOUS | Status: DC

## 2024-02-16 MED ORDER — PANTOPRAZOLE SODIUM 40 MG PO TBEC
40.0000 mg | DELAYED_RELEASE_TABLET | Freq: Every day | ORAL | Status: DC
Start: 2024-02-16 — End: 2024-02-16

## 2024-02-16 MED ORDER — SODIUM CHLORIDE 0.9 % IV SOLN: 250.0000 mL | INTRAVENOUS | Status: AC | PRN

## 2024-02-16 MED ORDER — INSULIN ASPART 100 UNIT/ML IJ SOLN
0.0000 [IU] | Freq: Three times a day (TID) | INTRAMUSCULAR | Status: DC
  Administered 2024-02-17: 3 [IU] via SUBCUTANEOUS
  Administered 2024-02-17: 2 [IU] via SUBCUTANEOUS
  Administered 2024-02-18 – 2024-02-19 (×3): 1 [IU] via SUBCUTANEOUS

## 2024-02-16 MED ORDER — ORAL CARE MOUTH RINSE: 15.0000 mL | OROMUCOSAL | Status: DC | PRN

## 2024-02-16 MED ORDER — ATORVASTATIN CALCIUM 80 MG PO TABS
80.0000 mg | ORAL_TABLET | Freq: Every evening | ORAL | Status: AC
Start: 2024-02-16 — End: ?
  Administered 2024-02-16 – 2024-02-21 (×5): 80 mg via ORAL
  Filled 2024-02-16 (×5): qty 1

## 2024-02-16 MED ORDER — ACETAMINOPHEN 650 MG RE SUPP: 650.0000 mg | Freq: Four times a day (QID) | RECTAL | Status: DC | PRN

## 2024-02-16 MED ORDER — AMLODIPINE BESYLATE 10 MG PO TABS
10.0000 mg | ORAL_TABLET | Freq: Every day | ORAL | Status: DC
Start: 2024-02-17 — End: 2024-02-18
  Filled 2024-02-16: qty 2

## 2024-02-16 MED ORDER — UMECLIDINIUM-VILANTEROL 62.5-25 MCG/ACT IN AEPB
1.0000 | INHALATION_SPRAY | Freq: Every day | RESPIRATORY_TRACT | Status: DC
  Administered 2024-02-20 – 2024-02-21 (×2): 1 via RESPIRATORY_TRACT
  Filled 2024-02-16 (×2): qty 14

## 2024-02-16 MED ORDER — LEVOTHYROXINE SODIUM 75 MCG PO TABS
175.0000 ug | ORAL_TABLET | Freq: Every day | ORAL | Status: DC
  Administered 2024-02-17 – 2024-02-22 (×5): 175 ug via ORAL
  Filled 2024-02-16 (×6): qty 1

## 2024-02-16 NOTE — ED Provider Notes (Signed)
 [place] [facility] AT [facility] Provider Note   CSN: [id number] Arrival date & time: 02/16/24  9087     Patient presents with: Rectal Bleeding   Rachael Herrera is a 63 y.o. female.   HPI 63 year old female presents with rectal bleeding.  History is primarily from the daughter.  Patient has a history significant for stroke, diabetes, ESRD on dialysis, and multiple other comorbidities.  She has A-fib and is on Eliquis .  For about 1 week she has been having blood in the stool every time she goes to the bathroom.  She usually has multiple bowel movements per day though small in nature.  Patient has not seemed to have any abdominal pain.  She has never had a colonoscopy per the daughter.  She had dialysis yesterday and states her hemoglobin was checked and the daughter was told that it was similar to earlier in the month.  Prior to Admission medications   Medication Sig Start Date End Date Taking? Authorizing Provider  amitriptyline  (ELAVIL ) 25 MG tablet Take 25 mg by mouth at bedtime. 06/16/20   [provider]  amLODipine  (NORVASC ) 10 MG tablet Take 10 mg by mouth daily. 06/16/20   [provider]  apixaban  (ELIQUIS ) 5 MG TABS tablet Take 1 tablet (5 mg total) by mouth 2 (two) times daily. 07/29/20   Regalado, Belkys A, MD  atorvastatin  (LIPITOR ) 80 MG tablet Take 80 mg by mouth every evening. 04/06/19   [provider]  cholecalciferol  (VITAMIN D3) 25 MCG (1000 UNIT) tablet Take 1,000 Units by mouth daily.    [provider]  cinacalcet  (SENSIPAR ) 30 MG tablet Take 1 tablet (30 mg total) by mouth daily with breakfast. 05/04/21   Vernon Ranks, MD  diclofenac Sodium (VOLTAREN) 1 % GEL Apply 2 g topically 4 (four) times daily. Apply to knee 04/22/21   [provider]  gabapentin  (NEURONTIN ) 300 MG capsule Take 300 mg by mouth daily. 04/30/14   [provider]  insulin  aspart (NOVOLOG ) 100 UNIT/ML injection Inject 2 Units  into the skin 3 (three) times daily with meals. Patient not taking: Reported on 05/03/2021 07/29/20   Regalado, Belkys A, MD  insulin  glargine (LANTUS  SOLOSTAR) 100 UNIT/ML Solostar Pen Inject 30 Units into the skin daily. 12/19/20   [provider]  insulin  glargine (LANTUS ) 100 UNIT/ML injection Inject 0.35 mLs (35 Units total) into the skin 2 (two) times daily. Patient not taking: Reported on 05/03/2021 07/29/20   Regalado, Belkys A, MD  insulin  lispro (HUMALOG) 100 UNIT/ML injection Inject 0-14 Units into the skin 3 (three) times daily before meals. Per sliding scale    [provider]  levothyroxine  (SYNTHROID ) 150 MCG tablet Take 150 mcg by mouth daily before breakfast.    [provider]  losartan  (COZAAR ) 50 MG tablet Take 50 mg by mouth daily. 06/16/20   [provider]  omeprazole (PRILOSEC) 20 MG capsule Take 20 mg by mouth daily.    [provider]  polyethylene glycol (MIRALAX  / GLYCOLAX ) 17 g packet Take 17 g by mouth daily. Patient not taking: Reported on 05/03/2021 07/30/20   Regalado, Belkys A, MD  senna-docusate (SENOKOT-S) 8.6-50 MG tablet Take 1 tablet by mouth at bedtime as needed for mild constipation. Patient not taking: Reported on 05/03/2021 07/29/20   Regalado, Belkys A, MD  sevelamer  carbonate (RENVELA ) 800 MG tablet Take 800 mg by mouth 3 (three) times daily. 06/16/20   [provider]  torsemide  (DEMADEX ) 20 MG  tablet Take 20 mg by mouth daily. 06/16/20   [provider]  vitamin E  180 MG (400 UNITS) capsule Take 400 Units by mouth daily.    [provider]    Allergies: Patient has no known allergies.    Review of Systems  Gastrointestinal:  Positive for blood in stool. Negative for abdominal pain and vomiting.    Updated Vital Signs BP 124/66 (BP Location: Right Arm)   Pulse (!) 56   Temp 98 F (36.7 C) (Oral)   Resp 18   Wt 105.2 kg   SpO2 98%   BMI 36.34 kg/m   Physical Exam Vitals and  nursing note reviewed. Exam conducted with a chaperone present.  Constitutional:      General: She is not in acute distress.    Appearance: She is well-developed. She is not ill-appearing or diaphoretic.  HENT:     Head: Normocephalic and atraumatic.  Cardiovascular:     Rate and Rhythm: Normal rate and regular rhythm.     Heart sounds: Normal heart sounds.  Pulmonary:     Effort: Pulmonary effort is normal.  Abdominal:     Palpations: Abdomen is soft.     Tenderness: There is no abdominal tenderness.  Genitourinary:    Rectum: No mass or external hemorrhoid.     Comments: Dark red blood on DRE. No obvious hemorrhoid or mass. Skin:    General: Skin is warm and dry.  Neurological:     Mental Status: She is alert.     (all labs ordered are listed, but only abnormal results are displayed) Labs Reviewed  COMPREHENSIVE METABOLIC PANEL WITH GFR - Abnormal; Notable for the following components:      Result Value   Glucose, Bld 153 (*)    BUN 25 (*)    Creatinine, Ser 4.61 (*)    Alkaline Phosphatase 134 (*)    GFR, Estimated 10 (*)    All other components within normal limits  CBC WITH DIFFERENTIAL/PLATELET - Abnormal; Notable for the following components:   WBC 3.7 (*)    RBC 3.08 (*)    Hemoglobin 9.2 (*)    HCT 29.3 (*)    RDW 16.8 (*)    Platelets 107 (*)    All other components within normal limits  PROTIME-INR - Abnormal; Notable for the following components:   Prothrombin Time 18.0 (*)    INR 1.4 (*)    All other components within normal limits  OCCULT BLOOD X 1 CARD TO LAB, STOOL  CBC  CBC    EKG: None  Radiology: No results found.   Procedures   Medications Ordered in the ED - No data to display                                  Medical Decision Making Amount and/or Complexity of Data Reviewed External Data Reviewed: labs and notes.    Details: Hemoglobin 9.6 yesterday Labs: ordered.    Details: Hemoglobin 9.2, lower than 9.6 from  yesterday  Risk Decision regarding hospitalization.   Patient with rectal bleeding for about a week.  Overall is well-appearing with stable vitals.  However she does have a slowly downtrending hemoglobin and some active GI bleeding with obvious dark red blood on rectal exam.  Discussed with GI, Dr. Kriss, they will see in consultation.  Discussed with Dr. Seena, who will admit.  Will need to [facility] given her ESRD status (had dialysis yesterday).  Currently stable for transfer.     Final diagnoses:  Rectal bleeding    ED Discharge Orders     None          Freddi Hamilton, MD 02/16/24 [id number]

## 2024-02-16 NOTE — ED Notes (Signed)
 Assisted patient back into bed after utilizing the commode. Pt is covered with 5 blankets and continued to ask for more. Explained we cannot keep just adding blankets because we have other patients.

## 2024-02-16 NOTE — H&P (Addendum)
 History and Physical    Rachael Herrera FMW:[id number] DOB: [birth year] DOA: 02/16/2024  PCP: Patient, No Pcp Per   Patient coming from: Home   Chief Complaint:  Chief Complaint  Patient presents with   Rectal Bleeding   ED TRIAGE note:Rectal pain and bleeding x 1 week , bright red bleeding and clots , denies abd pain . unknown Hx hemorrhoids . Dialysis patient ,   HPI:  Rachael Herrera is a 63 y.o. female with medical history significant of paroxysmal atrial fibrillation on Eliquis , DM type II, CAD, ESRD on dialysis TTS schedule, peripheral artery disease, peripheral vascular disease, essential hypertension, hyperlipidemia and obstructive sleep apnea presented emergency department with complaining of bright red blood per rectum for 1 week. Patient denies any history of hemorrhoids. Patient reported bright red bleeding and clots.  Denies any abdominal pain. Patient reported that over the course of last 1 week every time she goes to bathroom she noticed blood mixed with the stool.    At presentation to ED patient is hemodynamically stable.  EDP consulted discussed case with [facility] Dr. Kriss stated that we will see patient for consultation. Has been transferred to [facility] for further evaluation management.   ED Course:  At presentation to ED patient is hemodynamically stable. CBC showing hemoglobin 8.2, hematocrit 26 (baseline hemoglobin variable in between 9.2-13, low platelet count 105 and low WBC count 3.4. Pending FOBT.  Normal pro time INR. CMP unremarkable except evidence of ESRD.  Normal electrolytes.  ED physician performed rectal exam which showed dark red blood on rectal exam.  Hospitalist has been consulted for further evaluation management of GI bleed.  Significant labs in the ED: Lab Orders         Comprehensive metabolic panel         CBC with Differential         Protime-INR         Occult blood card to lab, stool         CBC         HIV Antibody  (routine testing w rflx)         Comprehensive metabolic panel         CBC         Protime-INR         Hemoglobin A1c         Glucose, capillary         Hemoglobin and hematocrit, blood       Review of Systems:  Review of Systems  Constitutional:  Negative for chills, fever, malaise/fatigue and weight loss.  Respiratory:  Negative for cough, sputum production and shortness of breath.   Cardiovascular:  Negative for chest pain and palpitations.  Gastrointestinal:  Positive for blood in stool. Negative for abdominal pain, constipation, diarrhea, heartburn, melena, nausea and vomiting.  Musculoskeletal:  Negative for myalgias.  Neurological:  Negative for dizziness.  Psychiatric/Behavioral:  Negative for depression. The patient is not nervous/anxious and does not have insomnia.     Past Medical History:  Diagnosis Date   Cavitary pneumonia 06/2017   due to MSSA   Claudication of both lower extremities    DDD (degenerative disc disease), cervical    DDD (degenerative disc disease), lumbosacral    Diabetes mellitus (HCC)    Diabetic neuropathy (HCC)    ESRD (end stage renal disease) on dialysis (HCC)    GERD (gastroesophageal reflux disease)    NSVT (nonsustained ventricular tachycardia) (HCC) 10/20201   OSA  on CPAP    Stroke (HCC)    Ulnar neuropathy    BUE   Vitamin B 12 deficiency     Past Surgical History:  Procedure Laterality Date   IR ANGIO VERTEBRAL SEL VERTEBRAL UNI L MOD SED  07/11/2020   IR CT HEAD LTD  07/11/2020   IR PERCUTANEOUS ART THROMBECTOMY/INFUSION INTRACRANIAL INC DIAG ANGIO  07/11/2020   IR US  GUIDE VASC ACCESS LEFT  07/12/2020   RADIOLOGY WITH ANESTHESIA N/A 07/11/2020   Procedure: IR WITH ANESTHESIA;  Surgeon: Radiologist, Medication, MD;  Location: [facility] OR;  Service: Radiology;  Laterality: N/A;     reports that she has never smoked. She has never used smokeless tobacco. She reports that she does not currently use alcohol. She reports that she does not  currently use drugs.  No Known Allergies  Family History  Problem Relation Age of Onset   Diabetes Mother    Heart disease Mother    Diabetes Father    Heart disease Father    Diabetes Sister     Prior to Admission medications   Medication Sig Start Date End Date Taking? Authorizing Provider  amitriptyline  (ELAVIL ) 25 MG tablet Take 25 mg by mouth at bedtime. 06/16/20   [provider]  amLODipine  (NORVASC ) 10 MG tablet Take 10 mg by mouth daily. 06/16/20   [provider]  apixaban  (ELIQUIS ) 5 MG TABS tablet Take 1 tablet (5 mg total) by mouth 2 (two) times daily. 07/29/20   Regalado, Belkys A, MD  atorvastatin  (LIPITOR ) 80 MG tablet Take 80 mg by mouth every evening. 04/06/19   [provider]  cholecalciferol  (VITAMIN D3) 25 MCG (1000 UNIT) tablet Take 1,000 Units by mouth daily.    [provider]  cinacalcet  (SENSIPAR ) 30 MG tablet Take 1 tablet (30 mg total) by mouth daily with breakfast. 05/04/21   Vernon Ranks, MD  diclofenac Sodium (VOLTAREN) 1 % GEL Apply 2 g topically 4 (four) times daily. Apply to knee 04/22/21   [provider]  gabapentin  (NEURONTIN ) 300 MG capsule Take 300 mg by mouth daily. 04/30/14   [provider]  insulin  aspart (NOVOLOG ) 100 UNIT/ML injection Inject 2 Units into the skin 3 (three) times daily with meals. Patient not taking: Reported on 05/03/2021 07/29/20   Regalado, Belkys A, MD  insulin  glargine (LANTUS  SOLOSTAR) 100 UNIT/ML Solostar Pen Inject 30 Units into the skin daily. 12/19/20   [provider]  insulin  glargine (LANTUS ) 100 UNIT/ML injection Inject 0.35 mLs (35 Units total) into the skin 2 (two) times daily. Patient not taking: Reported on 05/03/2021 07/29/20   Regalado, Belkys A, MD  insulin  lispro (HUMALOG) 100 UNIT/ML injection Inject 0-14 Units into the skin 3 (three) times daily before meals. Per sliding scale    [provider]  levothyroxine  (SYNTHROID ) 150 MCG tablet Take  150 mcg by mouth daily before breakfast.    [provider]  losartan  (COZAAR ) 50 MG tablet Take 50 mg by mouth daily. 06/16/20   [provider]  omeprazole (PRILOSEC) 20 MG capsule Take 20 mg by mouth daily.    [provider]  polyethylene glycol (MIRALAX  / GLYCOLAX ) 17 g packet Take 17 g by mouth daily. Patient not taking: Reported on 05/03/2021 07/30/20   Regalado, Belkys A, MD  senna-docusate (SENOKOT-S) 8.6-50 MG tablet Take 1 tablet by mouth at bedtime as needed for mild constipation. Patient not taking: Reported on 05/03/2021 07/29/20   Regalado, Belkys A, MD  sevelamer  carbonate (RENVELA )  800 MG tablet Take 800 mg by mouth 3 (three) times daily. 06/16/20   [provider]  torsemide  (DEMADEX ) 20 MG tablet Take 20 mg by mouth daily. 06/16/20   [provider]  vitamin E  180 MG (400 UNITS) capsule Take 400 Units by mouth daily.    [provider]     Physical Exam: Vitals:   02/16/24 9076 02/16/24 0928 02/16/24 1657  BP:  124/66 (!) 142/68  Pulse:  (!) 56 64  Resp:  18 15  Temp:  98 F (36.7 C)   TempSrc:  Oral   SpO2:  98% 98%  Weight: 105.2 kg      Physical Exam Vitals and nursing note reviewed.  Constitutional:      Appearance: She is not ill-appearing.  HENT:     Mouth/Throat:     Mouth: Mucous membranes are moist.  Eyes:     Pupils: Pupils are equal, round, and reactive to light.  Cardiovascular:     Rate and Rhythm: Regular rhythm.     Pulses: Normal pulses.     Heart sounds: Normal heart sounds.  Pulmonary:     Effort: Pulmonary effort is normal.     Breath sounds: Normal breath sounds.  Abdominal:     General: There is no distension.     Palpations: Abdomen is soft.     Tenderness: There is no abdominal tenderness. There is no guarding or rebound.     Hernia: No hernia is present.  Musculoskeletal:     Cervical back: Normal range of motion and neck supple.  Skin:    Capillary Refill: Capillary refill  takes less than 2 seconds.     Coloration: Skin is not pale.  Neurological:     Mental Status: She is alert and oriented to person, place, and time.  Psychiatric:        Mood and Affect: Mood normal.      Labs on Admission: I have personally reviewed following labs and imaging studies  CBC: Recent Labs  Lab 02/16/24 0953 02/16/24 1550  WBC 3.7* 3.4*  NEUTROABS 2.2  --   HGB 9.2* 8.2*  HCT 29.3* 26.9*  MCV 95.1 96.4  PLT 107* 105*   Basic Metabolic Panel: Recent Labs  Lab 02/16/24 0953  NA 142  K 4.2  CL 102  CO2 27  GLUCOSE 153*  BUN 25*  CREATININE 4.61*  CALCIUM  9.5   GFR: CrCl cannot be calculated (Unknown ideal weight.). Liver Function Tests: Recent Labs  Lab 02/16/24 0953  AST 22  ALT 11  ALKPHOS 134*  BILITOT 0.9  PROT 6.5  ALBUMIN 3.7   No results for input(s): LIPASE, AMYLASE in the last 168 hours. No results for input(s): AMMONIA in the last 168 hours. Coagulation Profile: Recent Labs  Lab 02/16/24 0953  INR 1.4*   Cardiac Enzymes: No results for input(s): CKTOTAL, CKMB, CKMBINDEX, TROPONINI, TROPONINIHS in the last 168 hours. BNP (last 3 results) No results for input(s): BNP in the last 8760 hours. HbA1C: Recent Labs    02/16/24 1942  HGBA1C 8.0*   CBG: Recent Labs  Lab 02/16/24 2018  GLUCAP 227*   Lipid Profile: No results for input(s): CHOL, HDL, LDLCALC, TRIG, CHOLHDL, LDLDIRECT in the last 72 hours. Thyroid Function Tests: No results for input(s): TSH, T4TOTAL, FREET4, T3FREE, THYROIDAB in the last 72 hours. Anemia Panel: No results for input(s): VITAMINB12, FOLATE, FERRITIN, TIBC, IRON, RETICCTPCT in the last 72 hours. Urine analysis: No results found for:  COLORURINE, APPEARANCEUR, LABSPEC, PHURINE, GLUCOSEU, HGBUR, BILIRUBINUR, KETONESUR, PROTEINUR, UROBILINOGEN, NITRITE, LEUKOCYTESUR  Radiological Exams on Admission: I have personally reviewed  images No results found.   EKG: Pending EKG    Assessment/Plan: Principal Problem:   Acute GI bleeding Active Problems:   Paroxysmal atrial fibrillation (HCC)   Leukopenia   Thrombocytopenia   Insulin  dependent type 2 diabetes mellitus (HCC)   Essential hypertension   Obstructive sleep apnea   ESRD (end stage renal disease) (HCC)   Hyperlipidemia   History of CAD (coronary artery disease)   PVD (peripheral vascular disease)   Peripheral neuropathy   Reactive airway disease   Generalized anxiety disorder    Assessment and Plan: Lower GI bleed Leukopenia Thrombocytopenia -Presented to emergency department complaining of bright red red blood per rectum and blood clots for 1 week.  Denies any history of previous GI bleed and hemorrhoids.  I presentation to ED patient is hemodynamically stable.  Baseline hemoglobin around 9-13 and hemoglobin today is9.2>>8.2.  Also found to have low WBC count 3.4 and low platelet count 107. Slightly bated pro time INR. Pending FOBT.  No previous history of EGD and colonoscopy - Given patient has bright red blood per rectum concern for lower GI bleed origin. - History of GERD currently on Protonix  and given patient also taking Eliquis  5 mg twice daily starting IV Protonix  40 mg twice daily until being evaluated by GI. - EDP consulted [facility] Dr. Kriss and I have informed Dr. Abran to evaluate patient in the daytime.  Currently patient is hemodynamically stable. -Obtaining type and screen and preparing 1 unit of blood in case patient needs blood transfusion. - Checking H&H at midnight if hemoglobin drops below 8 will transfuse 1 unit of blood. -Holding Eliquis  in the setting of GI bleed.  Pending discussion with gastroenterology regarding timeline to initiate Eliquis . -Patient also has leukopenia and thrombocytopenia.  Continue to monitor and if continues to have persistent leukopenia and thrombocytopenia need to inform hematology for further  evaluation.   History of paroxysmal atrial fibrillation -Heart rate in between 56-64.  At home patient is not any beta-blocker or calcium  channel blocker. -Continue to hold Eliquis  in the setting of GI bleed.  Essential hypertension -Continue amlodipine .  Per chart review of previous hospital admission in May 2025 patient only taking amlodipine  10 mg currently not on losartan .   ESRD on dialysis TTS schedule History of ESRD on dialysis TTS schedule at dialysis on Tuesday. - Consulted inform on-call nephrology Dr. Windle request for dialysis tomorrow. -Continue Cinacalcet  and Renvela .  History of CAD History of PAD -Holding Eliquis  in the setting of GI bleed - Continue Lipitor .   Insulin -dependent DM type II -Continue Lantus  10 units in the morning and sliding scale insulin  with mealtime.  Patient reported not taking long-acting insulin  anymore.  Hypothyroidism -Continue levothyroxine  175 mg in the morning  Peripheral neuropathy - Continue gabapentin    Obstructive sleep apnea - Continue CPAP at bedtime  Reactive airway disease - At home patient is on Trelegy Ellipta once daily.  Continue Anoro Ellipta  once daily   DVT prophylaxis:  SCDs and TED hose.  Deferring pharmacological DVT prophylaxis in the setting of GI bleed Code Status:  Full Code Diet: Renal carb modified diet Family Communication:   Family was present at bedside, at the time of interview. Opportunity was given to ask question and all questions were answered satisfactorily.  Disposition Plan: Continue to monitor H&H and transfuse as needed goal to keep hemoglobin above  8. Consults: Gastroenterology Admission status:   Inpatient, Step Down Unit  Severity of Illness: The appropriate patient status for this patient is INPATIENT. Inpatient status is judged to be reasonable and necessary in order to provide the required intensity of service to ensure the patient's safety. The patient's presenting symptoms,  physical exam findings, and initial radiographic and laboratory data in the context of their chronic comorbidities is felt to place them at high risk for further clinical deterioration. Furthermore, it is not anticipated that the patient will be medically stable for discharge from the hospital within 2 midnights of admission.   * I certify that at the point of admission it is my clinical judgment that the patient will require inpatient hospital care spanning beyond 2 midnights from the point of admission due to high intensity of service, high risk for further deterioration and high frequency of surveillance required.DEWAINE    Lavada Langsam, MD [facility]  How to contact the [facility] Attending or Consulting provider 7A - 7P or covering provider during after hours 7P -7A, for this patient.  Check the care team in [facility] and look for a) attending/consulting [facility] provider listed and b) the [facility] team listed Log into [email or web address] and use 's universal password to access. If you do not have the password, please contact the hospital operator. Locate the [facility] provider you are looking for under [facility] and page to a number that you can be directly reached. If you still have difficulty reaching the provider, please page the [facility] (Director on Call) for the Hospitalists listed on amion for assistance.  02/16/2024, 8:34 PM

## 2024-02-16 NOTE — ED Notes (Signed)
 Pt's rectal blood is bright red at present. Still having diarrhea and urge to move her bowels, consistently.

## 2024-02-16 NOTE — ED Notes (Signed)
 Called [facility] for Transport to [facility] @15 :25.  Spoke with [employer].

## 2024-02-16 NOTE — ED Notes (Signed)
 Rachael Herrera accepted Hospitalist.

## 2024-02-16 NOTE — Progress Notes (Signed)
 Daughter Forever Arechiga notified of pt transfer to 6E room 27. Pt stable at time of transfer, all belongings sent with pt (pt belongings bag, cell phone, paper book, clothing items, wallet/purse).

## 2024-02-16 NOTE — ED Triage Notes (Signed)
 Rectal pain and  bleeding x 1 week , bright red  bleeding and clots , denies abd pain . unknown Hx hemorrhoids . Dialysis patient ,

## 2024-02-16 NOTE — ED Notes (Signed)
 Assisted patient to bedside commode

## 2024-02-17 ENCOUNTER — Inpatient Hospital Stay (HOSPITAL_COMMUNITY)

## 2024-02-17 DIAGNOSIS — K922 Gastrointestinal hemorrhage, unspecified: Secondary | ICD-10-CM | POA: Diagnosis not present

## 2024-02-17 LAB — COMPREHENSIVE METABOLIC PANEL WITH GFR
ALT: 12 U/L (ref 0–44)
AST: 20 U/L (ref 15–41)
Albumin: 2.7 g/dL — ABNORMAL LOW (ref 3.5–5.0)
Alkaline Phosphatase: 101 U/L (ref 38–126)
Anion gap: 12 (ref 5–15)
BUN: 30 mg/dL — ABNORMAL HIGH (ref 8–23)
CO2: 24 mmol/L (ref 22–32)
Calcium: 9.1 mg/dL (ref 8.9–10.3)
Chloride: 101 mmol/L (ref 98–111)
Creatinine, Ser: 5.62 mg/dL — ABNORMAL HIGH (ref 0.44–1.00)
GFR, Estimated: 8 mL/min — ABNORMAL LOW (ref >60)
Glucose, Bld: 173 mg/dL — ABNORMAL HIGH (ref 70–99)
Potassium: 4.3 mmol/L (ref 3.5–5.1)
Sodium: 137 mmol/L (ref 135–145)
Total Bilirubin: 1.2 mg/dL (ref 0.0–1.2)
Total Protein: 5.6 g/dL — ABNORMAL LOW (ref 6.5–8.1)

## 2024-02-17 LAB — CBC
HCT: 28 % — ABNORMAL LOW (ref 36.0–46.0)
Hemoglobin: 8.5 g/dL — ABNORMAL LOW (ref 12.0–15.0)
MCH: 29.6 pg (ref 26.0–34.0)
MCHC: 30.4 g/dL (ref 30.0–36.0)
MCV: 97.6 fL (ref 80.0–100.0)
Platelets: 100 K/uL — ABNORMAL LOW (ref 150–400)
RBC: 2.87 MIL/uL — ABNORMAL LOW (ref 3.87–5.11)
RDW: 17 % — ABNORMAL HIGH (ref 11.5–15.5)
WBC: 3.3 K/uL — ABNORMAL LOW (ref 4.0–10.5)
nRBC: 0 % (ref 0.0–0.2)

## 2024-02-17 LAB — GLUCOSE, CAPILLARY
Glucose-Capillary: 258 mg/dL — ABNORMAL HIGH (ref 70–99)
Glucose-Capillary: 238 mg/dL — ABNORMAL HIGH (ref 70–99)
Glucose-Capillary: 76 mg/dL (ref 70–99)
Glucose-Capillary: 85 mg/dL (ref 70–99)

## 2024-02-17 LAB — PROTIME-INR
INR: 1.3 — ABNORMAL HIGH (ref 0.8–1.2)
Prothrombin Time: 17.3 s — ABNORMAL HIGH (ref 11.4–15.2)

## 2024-02-17 LAB — HEMOGLOBIN AND HEMATOCRIT, BLOOD
HCT: 24.6 % — ABNORMAL LOW (ref 36.0–46.0)
Hemoglobin: 7.8 g/dL — ABNORMAL LOW (ref 12.0–15.0)

## 2024-02-17 LAB — HEPATITIS B SURFACE ANTIGEN: Hepatitis B Surface Ag: NONREACTIVE

## 2024-02-17 LAB — MRSA NEXT GEN BY PCR, NASAL: MRSA by PCR Next Gen: NOT DETECTED

## 2024-02-17 MED ORDER — LIDOCAINE-PRILOCAINE 2.5-2.5 % EX CREA: 1.0000 | TOPICAL_CREAM | CUTANEOUS | Status: DC | PRN

## 2024-02-17 MED ORDER — PENTAFLUOROPROP-TETRAFLUOROETH EX AERO: 1.0000 | INHALATION_SPRAY | CUTANEOUS | Status: DC | PRN

## 2024-02-17 MED ORDER — IOHEXOL 350 MG/ML SOLN
75.0000 mL | Freq: Once | INTRAVENOUS | Status: AC | PRN
  Administered 2024-02-17: 75 mL via INTRAVENOUS

## 2024-02-17 MED ORDER — LIDOCAINE HCL (PF) 1 % IJ SOLN: 5.0000 mL | INTRAMUSCULAR | Status: DC | PRN

## 2024-02-17 MED ORDER — PENTAFLUOROPROP-TETRAFLUOROETH EX AERO
INHALATION_SPRAY | CUTANEOUS | Status: AC
  Filled 2024-02-17: qty 30

## 2024-02-17 MED ORDER — CHLORHEXIDINE GLUCONATE CLOTH 2 % EX PADS
6.0000 | MEDICATED_PAD | Freq: Every day | CUTANEOUS | Status: AC
Start: 2024-02-17 — End: ?
  Administered 2024-02-17 – 2024-02-22 (×4): 6 via TOPICAL

## 2024-02-17 NOTE — Consult Note (Signed)
 Reason for Consult: Continuity of ESRD care Referring Physician: Mennie LAMY MD [facility])  HPI:  63 year old woman with past medical history significant for peripheral vascular disease, hypertension, type 2 diabetes mellitus, history of CVA with residual dysarthria, paroxysmal atrial fibrillation on anticoagulation with Eliquis , dyslipidemia, obstructive sleep apnea and end-stage renal disease on hemodialysis on a TTS schedule.  Brought to the emergency room with concerns of painless bright red blood per rectum over the past week.  Denies any associated nausea, vomiting or diarrhea and does not have any abdominal pain.  She denies any chest pain or shortness of breath but reports to be feeling a little weak and easily fatigued compared to her baseline.  From records on care everywhere, she is under the care of [facility] and gets hemodialysis on a TTS schedule at [facility] dialysis.  Past Medical History:  Diagnosis Date   Cavitary pneumonia 06/2017   due to MSSA   Claudication of both lower extremities    DDD (degenerative disc disease), cervical    DDD (degenerative disc disease), lumbosacral    Diabetes mellitus (HCC)    Diabetic neuropathy (HCC)    ESRD (end stage renal disease) on dialysis (HCC)    GERD (gastroesophageal reflux disease)    NSVT (nonsustained ventricular tachycardia) (HCC) 10/20201   OSA on CPAP    Stroke (HCC)    Ulnar neuropathy    BUE   Vitamin B 12 deficiency     Past Surgical History:  Procedure Laterality Date   IR ANGIO VERTEBRAL SEL VERTEBRAL UNI L MOD SED  07/11/2020   IR CT HEAD LTD  07/11/2020   IR PERCUTANEOUS ART THROMBECTOMY/INFUSION INTRACRANIAL INC DIAG ANGIO  07/11/2020   IR US  GUIDE VASC ACCESS LEFT  07/12/2020   RADIOLOGY WITH ANESTHESIA N/A 07/11/2020   Procedure: IR WITH ANESTHESIA;  Surgeon: Radiologist, Medication, MD;  Location: [facility] OR;  Service: Radiology;  Laterality: N/A;    Family History  Problem Relation  Age of Onset   Diabetes Mother    Heart disease Mother    Diabetes Father    Heart disease Father    Diabetes Sister     Social History:  reports that she has never smoked. She has never used smokeless tobacco. She reports that she does not currently use alcohol. She reports that she does not currently use drugs.  Allergies: No Known Allergies  Medications: Scheduled:  sodium chloride    Intravenous Once   amLODipine   10 mg Oral Daily   atorvastatin   80 mg Oral QPM   Chlorhexidine  Gluconate Cloth  6 each Topical Q0600   cinacalcet   30 mg Oral Q breakfast   gabapentin   300 mg Oral Daily   insulin  aspart  0-5 Units Subcutaneous QHS   insulin  aspart  0-6 Units Subcutaneous TID WC   insulin  glargine  10 Units Subcutaneous Daily   levothyroxine   175 mcg Oral QAC breakfast   pantoprazole  (PROTONIX ) IV  40 mg Intravenous Q12H   sevelamer  carbonate  800 mg Oral TID WC   sodium chloride  flush  3 mL Intravenous Q12H   sodium chloride  flush  3 mL Intravenous Q12H   umeclidinium-vilanterol  1 puff Inhalation Daily   Continuous:  sodium chloride          Latest Ref Rng & Units 02/17/2024    3:50 AM 02/16/2024    9:53 AM 02/03/2022    1:25 PM  BMP  Glucose 70 - 99 mg/dL 826  846  146   BUN 8 - 23 mg/dL 30  25  27    Creatinine 0.44 - 1.00 mg/dL 4.37  5.38  5.06   Sodium 135 - 145 mmol/L 137  142  134   Potassium 3.5 - 5.1 mmol/L 4.3  4.2  3.9   Chloride 98 - 111 mmol/L 101  102  94   CO2 22 - 32 mmol/L 24  27  28    Calcium  8.9 - 10.3 mg/dL 9.1  9.5  8.5       Latest Ref Rng & Units 02/17/2024    3:50 AM 02/16/2024    8:42 PM 02/16/2024    3:50 PM  CBC  WBC 4.0 - 10.5 K/uL 3.3   3.4   Hemoglobin 12.0 - 15.0 g/dL 8.5  9.0  8.2   Hematocrit 36.0 - 46.0 % 28.0  29.4  26.9   Platelets 150 - 400 K/uL 100   105    No results found.  Review of Systems  Constitutional:  Positive for fatigue. Negative for chills and fever.  HENT:  Negative for nosebleeds, sore throat and trouble  swallowing.   Respiratory:  Negative for cough, chest tightness and shortness of breath.   Cardiovascular:  Negative for chest pain and leg swelling.  Gastrointestinal:  Positive for blood in stool. Negative for diarrhea, nausea and vomiting.  Genitourinary:  Negative for dysuria and urgency.  Musculoskeletal:  Negative for arthralgias and myalgias.  Neurological:  Positive for speech difficulty and weakness. Negative for dizziness and headaches.  Hematological:  Does not bruise/bleed easily.  All other systems reviewed and are negative.  Blood pressure 139/66, pulse 69, temperature 98 F (36.7 C), temperature source Axillary, resp. rate 18, weight 105.2 kg, SpO2 94%. Physical Exam Vitals and nursing note reviewed.  Constitutional:      Appearance: Normal appearance. She is obese. She is not ill-appearing.  HENT:     Head: Normocephalic and atraumatic.     Right Ear: External ear normal.     Left Ear: External ear normal.     Nose: Nose normal.     Mouth/Throat:     Mouth: Mucous membranes are dry.     Pharynx: Oropharynx is clear.  Eyes:     General: No scleral icterus.    Extraocular Movements: Extraocular movements intact.     Conjunctiva/sclera: Conjunctivae normal.  Cardiovascular:     Rate and Rhythm: Normal rate and regular rhythm.     Pulses: Normal pulses.     Heart sounds: Normal heart sounds.  Pulmonary:     Effort: Pulmonary effort is normal.     Breath sounds: Normal breath sounds. No wheezing or rales.  Abdominal:     General: Abdomen is flat. Bowel sounds are normal.     Palpations: Abdomen is soft.     Tenderness: There is no abdominal tenderness.  Musculoskeletal:     Cervical back: Normal range of motion and neck supple.     Right lower leg: Edema present.     Left lower leg: Edema present.     Comments: 1+ bilateral pitting lower extremity edema.  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   Neurological:     Mental Status: She is alert.     Assessment/Plan: 1.  Bright red blood per rectum: Seen earlier by gastroenterology and workup initiated for hematochezia.  She is scheduled to have a CT scan of the abdomen  and pelvis with intravenous contrast and continue to hold Eliquis  with ongoing H/H monitoring.  No triggers for colonoscopy unless bleeding recurs or persists at this time based on discussion with family. 2.  End-stage renal disease: Will order for hemodialysis today to continue her usual outpatient TTS schedule.  Dialysis will be heparin  free.  She is euvolemic and does not have any concerning electrolyte abnormalities or significant azotemia. 3.  Acute blood loss anemia: Secondary to hematochezia, will trend hemoglobin/hematocrit while here in the hospital to decide on need for PRBCs.  Will give ESA on Saturday. 4.  Hypertension: Blood pressures currently appear to be under good control, will continue to monitor with hemodialysis/ultrafiltration. 5.  Secondary hyperparathyroidism: Calcium  level at goal, will add additional lab for phosphorus level to decide on need for binders.  Gordy MARLA Blanch 02/17/2024, 10:31 AM

## 2024-02-17 NOTE — Hospital Course (Addendum)
 Rachael Herrera is a 63 y.o. female with PMH of  PAF on Eliquis , DM type II, CAD, ESRD on HD TTS PVD HTN HLD OSA presented to ED with complaining of bright red blood per rectum for 1 week.Patient denies any history of hemorrhoids. At presentation to ED patient is hemodynamically stable.  EDP consulted discussed case with [facility] Dr. Kriss CBC showing hemoglobin 8.2, hematocrit 26 (baseline hemoglobin variable in between 9.2-13, low platelet count 105 and low WBC count 3.4. Pending FOBT.  Normal pro time INR. CMP unremarkable except evidence of ESRD.  Normal electrolytes.  Subjective: Seen and examined today Overnight afebrile BP stable Labs reviewed-Hemoglobin  8.5, creat 5.6 On home o2 at 2 l,  Patient denies any nausea, vomiting, chest pain, shortness of breath, fever, chills, headache, focal weakness, numbness tingling, speech difficulties  Patient reports she had dark-colored bowel movement for x 2 days  Assessment and plan: Principal Problem:   Acute GI bleeding Active Problems:   Paroxysmal atrial fibrillation (HCC)   Leukopenia   Thrombocytopenia   Insulin  dependent type 2 diabetes mellitus (HCC)   Essential hypertension   Obstructive sleep apnea   ESRD (end stage renal disease) (HCC)   Hyperlipidemia   History of CAD (coronary artery disease)   PVD (peripheral vascular disease)   Peripheral neuropathy   Reactive airway disease   Generalized anxiety disorder   Acute lower GI bleeding   Hematochezia Acute blood loss anemia and anemia of ESRD: Last was Eliquis  9/23, currently on hold, GI has been consulted getting CT abdomen pelvis with IV contrast, trend H&H transfuse less than 7 g.  Continue PPI, continue to hold Eliquis  as per GI recommendation.  If bleeding recurs or persist likely need colonoscopy as per GI. Recent Labs  Lab 02/16/24 0953 02/16/24 1550 02/16/24 2042 02/17/24 0350  HGB 9.2* 8.2* 9.0* 8.5*  HCT 29.3* 26.9* 29.4* 28.0*    ESRD on HD TTS: LUE AV  fistula intact.  Currently euvolemic, nephrology following for dialysis today  Secondary hyperparathyroidism: Calcium  at goal monitor Phos to eval for binders  Hypertension Hyperlipidemia PVD History of CAD: BP stable, continue amlodipine , continue statin  PAF: Rate currently controlled, not on beta-blocker/CCB at home.  On Eliquis  but on hold due to GI bleeding  Insulin -dependent diabetes mellitus with hyperglycemia: Continue low-dose insulin  10 units Lantus  and SSI, monitor CBG Recent Labs  Lab 02/16/24 1942 02/16/24 2018 02/17/24 0834  GLUCAP  --  227* 258*  HGBA1C 8.0*  --   --    Hypothyroidism: Continue home Synthroid   Peripheral neuropathy: On gabapentin   Class II Obesity w/ Body mass index is 37.45 kg/m./ OSA: Will benefit with PCP follow-up, weight loss,healthy lifestyle. Cont CPAP  Mobility: PT Orders: Active  PT Follow up Rec:    DVT prophylaxis: SCDs Start: 02/16/24 1912 Place TED hose Start: 02/16/24 1912 Code Status:   Code Status: Full Code Family Communication: plan of care discussed with patient at bedside. Patient status is: Remains hospitalized because of severity of illness Level of care: Progressive   Dispo: The patient is from: Home with her daughter            Anticipated disposition: TBD Objective: Vitals last 24 hrs: Vitals:   02/16/24 2126 02/16/24 2310 02/17/24 0300 02/17/24 0803  BP: (!) 147/60 (!) 142/59 97/69 139/66  Pulse:  (!) 56  69  Resp: 20 19 18 18   Temp: 97.9 F (36.6 C) 97.9 F (36.6 C) 97.6 F (36.4 C) 98 F (  36.7 C)  TempSrc: Axillary Axillary Axillary Axillary  SpO2: 96% 92% 99% 94%  Weight:      Height:    5' 6 (1.676 m)    Physical Examination: General exam: alert awake, oriented, older than stated age HEENT:Oral mucosa moist, Ear/Nose WNL grossly Respiratory system: Bilaterally clear BS,no use of accessory muscle Cardiovascular system: S1 & S2 +, No JVD. Gastrointestinal system: Abdomen soft,NT,ND,  BS+ Nervous System: Alert, awake, moving all extremities,and following commands. Extremities: extremities warm, leg edema neg Skin: No rashes,no icterus. MSK: Normal muscle bulk,tone, power   Medications reviewed:  Scheduled Meds:  sodium chloride    Intravenous Once   amLODipine   10 mg Oral Daily   atorvastatin   80 mg Oral QPM   Chlorhexidine  Gluconate Cloth  6 each Topical Q0600   cinacalcet   30 mg Oral Q breakfast   gabapentin   300 mg Oral Daily   insulin  aspart  0-5 Units Subcutaneous QHS   insulin  aspart  0-6 Units Subcutaneous TID WC   insulin  glargine  10 Units Subcutaneous Daily   levothyroxine   175 mcg Oral QAC breakfast   pantoprazole  (PROTONIX ) IV  40 mg Intravenous Q12H   sevelamer  carbonate  800 mg Oral TID WC   sodium chloride  flush  3 mL Intravenous Q12H   sodium chloride  flush  3 mL Intravenous Q12H   umeclidinium-vilanterol  1 puff Inhalation Daily   Continuous Infusions:  sodium chloride      Diet: Diet Order             Diet clear liquid Fluid consistency: Thin; Fluid restriction: 1200 mL Fluid  Diet effective now

## 2024-02-17 NOTE — Progress Notes (Signed)
 PROGRESS NOTE Rachael Herrera  FMW:[id number] DOB: Apr 06, 1961 DOA: 02/16/2024 PCP: Patient, No Pcp Per  Brief Narrative/Hospital Course: Rachael Herrera is a 63 y.o. female with PMH of  PAF on Eliquis , DM type II, CAD, ESRD on HD TTS PVD HTN HLD OSA presented to ED with complaining of bright red blood per rectum for 1 week.Patient denies any history of hemorrhoids. At presentation to ED patient is hemodynamically stable.  EDP consulted discussed case with [facility]. Kriss CBC showing hemoglobin 8.2, hematocrit 26 (baseline hemoglobin variable in between 9.2-13, low platelet count 105 and low WBC count 3.4. Pending FOBT.  Normal pro time INR. CMP unremarkable except evidence of ESRD.  Normal electrolytes.  Subjective: Seen and examined today Overnight afebrile BP stable Labs reviewed-Hemoglobin  8.5, creat 5.6 On home o2 at 2 l,  Patient denies any nausea, vomiting, chest pain, shortness of breath, fever, chills, headache, focal weakness, numbness tingling, speech difficulties  Patient reports she had dark-colored bowel movement for x 2 days  Assessment and plan: Principal Problem:   Acute GI bleeding Active Problems:   Paroxysmal atrial fibrillation (HCC)   Leukopenia   Thrombocytopenia   Insulin  dependent type 2 diabetes mellitus (HCC)   Essential hypertension   Obstructive sleep apnea   ESRD (end stage renal disease) (HCC)   Hyperlipidemia   History of CAD (coronary artery disease)   PVD (peripheral vascular disease)   Peripheral neuropathy   Reactive airway disease   Generalized anxiety disorder   Acute lower GI bleeding   Hematochezia Acute blood loss anemia and anemia of ESRD: Last was Eliquis  9/23, currently on hold, GI has been consulted getting CT abdomen pelvis with IV contrast, trend H&H transfuse less than 7 g.  Continue PPI, continue to hold Eliquis  as per GI recommendation.  If bleeding recurs or persist likely need colonoscopy as per GI. Recent Labs  Lab  02/16/24 0953 02/16/24 1550 02/16/24 2042 02/17/24 0350  HGB 9.2* 8.2* 9.0* 8.5*  HCT 29.3* 26.9* 29.4* 28.0*    ESRD on HD TTS: LUE AV fistula intact.  Currently euvolemic, nephrology following for dialysis today  Secondary hyperparathyroidism: Calcium  at goal monitor Phos to eval for binders  Hypertension Hyperlipidemia PVD History of CAD: BP stable, continue amlodipine , continue statin  PAF: Rate currently controlled, not on beta-blocker/CCB at home.  On Eliquis  but on hold due to GI bleeding  Insulin -dependent diabetes mellitus with hyperglycemia: Continue low-dose insulin  10 units Lantus  and SSI, monitor CBG Recent Labs  Lab 02/16/24 1942 02/16/24 2018 02/17/24 0834  GLUCAP  --  227* 258*  HGBA1C 8.0*  --   --    Hypothyroidism: Continue home Synthroid   Peripheral neuropathy: On gabapentin   Class II Obesity w/ Body mass index is 37.45 kg/m./ OSA: Will benefit with PCP follow-up, weight loss,healthy lifestyle. Cont CPAP  Mobility: PT Orders: Active  PT Follow up Rec:    DVT prophylaxis: SCDs Start: 02/16/24 1912 Place TED hose Start: 02/16/24 1912 Code Status:   Code Status: Full Code Family Communication: plan of care discussed with patient at bedside. Patient status is: Remains hospitalized because of severity of illness Level of care: Progressive   Dispo: The patient is from: Home with her daughter            Anticipated disposition: TBD Objective: Vitals last 24 hrs: Vitals:   02/16/24 2126 02/16/24 2310 02/17/24 0300 02/17/24 0803  BP: (!) 147/60 (!) 142/59 97/69 139/66  Pulse:  (!) 56  69  Resp:  20 19 18 18   Temp: 97.9 F (36.6 C) 97.9 F (36.6 C) 97.6 F (36.4 C) 98 F (36.7 C)  TempSrc: Axillary Axillary Axillary Axillary  SpO2: 96% 92% 99% 94%  Weight:      Height:    5' 6 (1.676 m)    Physical Examination: General exam: alert awake, oriented, older than stated age HEENT:Oral mucosa moist, Ear/Nose WNL grossly Respiratory  system: Bilaterally clear BS,no use of accessory muscle Cardiovascular system: S1 & S2 +, No JVD. Gastrointestinal system: Abdomen soft,NT,ND, BS+ Nervous System: Alert, awake, moving all extremities,and following commands. Extremities: extremities warm, leg edema neg Skin: No rashes,no icterus. MSK: Normal muscle bulk,tone, power   Medications reviewed:  Scheduled Meds:  sodium chloride    Intravenous Once   amLODipine   10 mg Oral Daily   atorvastatin   80 mg Oral QPM   Chlorhexidine  Gluconate Cloth  6 each Topical Q0600   cinacalcet   30 mg Oral Q breakfast   gabapentin   300 mg Oral Daily   insulin  aspart  0-5 Units Subcutaneous QHS   insulin  aspart  0-6 Units Subcutaneous TID WC   insulin  glargine  10 Units Subcutaneous Daily   levothyroxine   175 mcg Oral QAC breakfast   pantoprazole  (PROTONIX ) IV  40 mg Intravenous Q12H   sevelamer  carbonate  800 mg Oral TID WC   sodium chloride  flush  3 mL Intravenous Q12H   sodium chloride  flush  3 mL Intravenous Q12H   umeclidinium-vilanterol  1 puff Inhalation Daily   Continuous Infusions:  sodium chloride      Diet: Diet Order             Diet clear liquid Fluid consistency: Thin; Fluid restriction: 1200 mL Fluid  Diet effective now                   Data Reviewed: I have personally reviewed following labs and imaging studies ( see epic result tab) CBC: Recent Labs  Lab 02/16/24 0953 02/16/24 1550 02/16/24 2042 02/17/24 0350  WBC 3.7* 3.4*  --  3.3*  NEUTROABS 2.2  --   --   --   HGB 9.2* 8.2* 9.0* 8.5*  HCT 29.3* 26.9* 29.4* 28.0*  MCV 95.1 96.4  --  97.6  PLT 107* 105*  --  100*   CMP: Recent Labs  Lab 02/16/24 0953 02/17/24 0350  NA 142 137  K 4.2 4.3  CL 102 101  CO2 27 24  GLUCOSE 153* 173*  BUN 25* 30*  CREATININE 4.61* 5.62*  CALCIUM  9.5 9.1   GFR: Estimated Creatinine Clearance: 12.2 mL/min (A) (by C-G formula based on SCr of 5.62 mg/dL (H)). Recent Labs  Lab 02/16/24 0953 02/17/24 0350  AST 22 20   ALT 11 12  ALKPHOS 134* 101  BILITOT 0.9 1.2  PROT 6.5 5.6*  ALBUMIN 3.7 2.7*   No results for input(s): LIPASE, AMYLASE in the last 168 hours. No results for input(s): AMMONIA in the last 168 hours. Coagulation Profile:  Recent Labs  Lab 02/16/24 0953 02/17/24 0350  INR 1.4* 1.3*   Unresulted Labs (From admission, onward)     Start     Ordered   02/17/24 1047  Hepatitis B surface antibody,quantitative  (New Admission Hemo Labs (Hepatitis B))  ONCE - URGENT,   URGENT       Question:  Specimen collection method  Answer:  Lab=Lab collect   02/17/24 1047   02/17/24 1047  Hepatitis B surface antigen  (New Admission Hemo  Labs (Hepatitis B))  ONCE - URGENT,   URGENT       Question:  Specimen collection method  Answer:  Lab=Lab collect   02/17/24 1047   02/17/24 0940  HIV-1 RNA quant-no reflex-bld  Once,   R       Question:  Specimen collection method  Answer:  Lab=Lab collect   02/17/24 0939   02/16/24 1942  HIV-1/2 AB - differentiation  Once,   AD        02/16/24 1942           Antimicrobials/Microbiology: Anti-infectives (From admission, onward)    None      No results found for: SDES, SPECREQUEST, CULT, REPTSTATUS  Procedures:    Mennie LAMY, MD [facility] 02/17/2024, 11:36 AM

## 2024-02-17 NOTE — Plan of Care (Signed)

## 2024-02-17 NOTE — Progress Notes (Signed)
   02/17/24 2100  BiPAP/CPAP/SIPAP  Reason BIPAP/CPAP not in use Non-compliant (Pt does not wear CPAP at home and does not wish to wear here.)   Education provided by RT. Pt continues to refuse.

## 2024-02-17 NOTE — Progress Notes (Signed)
 Pt receives out-pt HD at [facility] on TTS. Pt arrives at 6:35 am for 6:50 am chair time. Will assist as needed.   Randine Mungo Dialysis Navigator [phone]

## 2024-02-17 NOTE — Progress Notes (Signed)
 Received patient back from HD. Attached to cardiac monitor, vitals signs taken. Patient states she is very hungry and is requesting for sandwich, re-educated patient that she is on clear liquid diet.  Offered her Beef broth, strawberry icies and jello.

## 2024-02-17 NOTE — Consult Note (Signed)
 [facility] Consult  Referring Provider: No ref. provider found Primary Care Physician:  Patient, No Pcp Per Primary Gastroenterologist: Margarete PCP - no gastroenterologist  Reason for Consultation: Rectal bleeding  SUBJECTIVE:   HPI: Rachael Herrera is a 63 y.o. female with past medical history significant for end stage renal disease on hemodialysis Tu/Th/Sa, atrial fibrillation on Eliquis  (last dose 02/15/24), history CVA with residual dysarthria, type 2 diabetes mellitus with peripheral neuropathy. Presented to hospital from home, where she lives with her caregiver and daughter, [name], for 1 week history of painless blood per rectum.   Given patient dysarthria, full history unable to be obtained from patient. Supplemental history obtained from patient's daughter, [name], over telephone. She began to have bright and dark red blood per rectum roughly 1 week prior. Bleeding would stop and restart. Patient did not complain of abdominal pain. No nausea or vomiting, appetite unchanged. No chest pain. Has history of supplemental oxygen use in past, none recently.  No prior colonoscopy per patient or family.   Past Medical History:  Diagnosis Date   Cavitary pneumonia 06/2017   due to MSSA   Claudication of both lower extremities    DDD (degenerative disc disease), cervical    DDD (degenerative disc disease), lumbosacral    Diabetes mellitus (HCC)    Diabetic neuropathy (HCC)    ESRD (end stage renal disease) on dialysis (HCC)    GERD (gastroesophageal reflux disease)    NSVT (nonsustained ventricular tachycardia) (HCC) 10/20201   OSA on CPAP    Stroke (HCC)    Ulnar neuropathy    BUE   Vitamin B 12 deficiency    Past Surgical History:  Procedure Laterality Date   IR ANGIO VERTEBRAL SEL VERTEBRAL UNI L MOD SED  07/11/2020   IR CT HEAD LTD  07/11/2020   IR PERCUTANEOUS ART THROMBECTOMY/INFUSION INTRACRANIAL INC DIAG ANGIO  07/11/2020   IR US  GUIDE VASC ACCESS LEFT  07/12/2020    RADIOLOGY WITH ANESTHESIA N/A 07/11/2020   Procedure: IR WITH ANESTHESIA;  Surgeon: Radiologist, Medication, MD;  Location: [facility] OR;  Service: Radiology;  Laterality: N/A;   Prior to Admission medications   Medication Sig Start Date End Date Taking? Authorizing Provider  albuterol  (VENTOLIN  HFA) 108 (90 Base) MCG/ACT inhaler Inhale 2 puffs into the lungs daily as needed for wheezing or shortness of breath. 02/06/24  Yes [provider]  apixaban  (ELIQUIS ) 5 MG TABS tablet Take 1 tablet (5 mg total) by mouth 2 (two) times daily. 07/29/20  Yes Regalado, Belkys A, MD  atorvastatin  (LIPITOR ) 80 MG tablet Take 80 mg by mouth daily. 04/06/19  Yes [provider]  diclofenac Sodium (VOLTAREN) 1 % GEL Apply 1 Application topically as needed (pain). 04/22/21  Yes [provider]  Doxepin HCl 3 MG TABS Take 3 mg by mouth at bedtime as needed (sleep). 06/26/23  Yes [provider]  gabapentin  (NEURONTIN ) 100 MG capsule Take 100 mg by mouth at bedtime.   Yes [provider]  insulin  glargine (LANTUS  SOLOSTAR) 100 UNIT/ML Solostar Pen Inject 10 Units into the skin daily. 12/19/20  Yes [provider]  insulin  lispro (HUMALOG) 100 UNIT/ML injection Inject 0-14 Units into the skin 3 (three) times daily before meals. Per sliding scale   Yes [provider]  levothyroxine  (SYNTHROID ) 175 MCG tablet Take 175 mcg by mouth daily before breakfast.   Yes [provider]  omeprazole (PRILOSEC) 20 MG capsule Take 20 mg by mouth daily.   Yes [provider]  sevelamer  carbonate (RENVELA ) 800 MG tablet Take 800 mg by mouth 3 (three) times daily. 06/16/20  Yes [provider]  torsemide  (DEMADEX ) 20 MG tablet Take 20 mg by mouth daily. 06/16/20  Yes [provider]  DOMINIC BECK 100-62.5-25 MCG/ACT AEPB Inhale 2 puffs into the lungs daily. 09/08/23  Yes [provider]  vitamin E  180 MG (400 UNITS) capsule Take 400 Units by  mouth daily.   Yes [provider]  amLODipine  (NORVASC ) 10 MG tablet Take 10 mg by mouth daily. Patient not taking: Reported on 02/17/2024 06/16/20   [provider]  losartan  (COZAAR ) 50 MG tablet Take 50 mg by mouth daily. Patient not taking: Reported on 02/17/2024 06/16/20   [provider]   Current Facility-Administered Medications  Medication Dose Route Frequency Provider Last Rate Last Admin   0.9 %  sodium chloride  infusion (Manually program via Guardrails IV Fluids)   Intravenous Once Sundil, Subrina, MD       0.9 %  sodium chloride  infusion  250 mL Intravenous PRN Sundil, Subrina, MD       acetaminophen  (TYLENOL ) tablet 650 mg  650 mg Oral Q6H PRN Sundil, Subrina, MD       Or   acetaminophen  (TYLENOL ) suppository 650 mg  650 mg Rectal Q6H PRN Sundil, Subrina, MD       amLODipine  (NORVASC ) tablet 10 mg  10 mg Oral Daily Sundil, Subrina, MD       atorvastatin  (LIPITOR ) tablet 80 mg  80 mg Oral QPM Sundil, Subrina, MD   80 mg at 02/16/24 2017   cinacalcet  (SENSIPAR ) tablet 30 mg  30 mg Oral Q breakfast Sundil, Subrina, MD   30 mg at 02/17/24 9243   gabapentin  (NEURONTIN ) capsule 300 mg  300 mg Oral Daily Sundil, Subrina, MD   300 mg at 02/16/24 2048   insulin  aspart (novoLOG ) injection 0-5 Units  0-5 Units Subcutaneous QHS Sundil, Subrina, MD   2 Units at 02/16/24 2048   insulin  aspart (novoLOG ) injection 0-6 Units  0-6 Units Subcutaneous TID WC Sundil, Subrina, MD   3 Units at 02/17/24 9158   insulin  glargine (LANTUS ) injection 10 Units  10 Units Subcutaneous Daily Sundil, Subrina, MD       levothyroxine  (SYNTHROID ) tablet 175 mcg  175 mcg Oral QAC breakfast Sundil, Subrina, MD   175 mcg at 02/17/24 0534   ondansetron  (ZOFRAN ) tablet 4 mg  4 mg Oral Q6H PRN Sundil, Subrina, MD       Or   ondansetron  (ZOFRAN ) injection 4 mg  4 mg Intravenous Q6H PRN Sundil, Subrina, MD       Oral care mouth rinse  15 mL Mouth Rinse PRN Sundil, Subrina, MD       pantoprazole   (PROTONIX ) injection 40 mg  40 mg Intravenous Q12H Sundil, Subrina, MD   40 mg at 02/16/24 2017   sevelamer  carbonate (RENVELA ) tablet 800 mg  800 mg Oral TID WC Sundil, Subrina, MD   800 mg at 02/17/24 0756   sodium chloride  flush (NS) 0.9 % injection 3 mL  3 mL Intravenous Q12H Sundil, Subrina, MD   3 mL at 02/16/24 2019   sodium chloride  flush (NS) 0.9 % injection 3 mL  3 mL Intravenous Q12H Sundil, Subrina, MD   3 mL at 02/16/24 2018   sodium chloride  flush (NS) 0.9 % injection 3 mL  3 mL Intravenous PRN Sundil, Subrina, MD       umeclidinium-vilanterol (ANORO ELLIPTA ) 62.5-25 MCG/ACT  1 puff  1 puff Inhalation Daily Sundil, Subrina, MD       Allergies as of 02/16/2024   (No Known Allergies)   Family History  Problem Relation Age of Onset   Diabetes Mother    Heart disease Mother    Diabetes Father    Heart disease Father    Diabetes Sister    Social History   Socioeconomic History   Marital status: Single    Spouse name: Not on file   Number of children: Not on file   Years of education: Not on file   Highest education level: Not on file  Occupational History   Not on file  Tobacco Use   Smoking status: Never   Smokeless tobacco: Never  Vaping Use   Vaping status: Never Used  Substance and Sexual Activity   Alcohol use: Not Currently   Drug use: Not Currently   Sexual activity: Not Currently  Other Topics Concern   Not on file  Social History Narrative   Not on file   Social Drivers of Health   Financial Resource Strain: Not on file  Food Insecurity: Low Risk  (06/27/2023)   Received from [facility]   Hunger Vital Sign    Within the past 12 months, you worried that your food would run out before you got money to buy more: Never true    Within the past 12 months, the food you bought just didn't last and you didn't have money to get more. : Never true  Transportation Needs: No Transportation Needs (06/27/2023)   Received from [facility]    In  the past 12 months, has lack of reliable transportation kept you from medical appointments, meetings, work or from getting things needed for daily living? : No  Physical Activity: Not on file  Stress: Not on file  Social Connections: Not on file  Intimate Partner Violence: Not on file   Review of Systems:  Review of Systems  Respiratory:  Negative for shortness of breath.   Cardiovascular:  Negative for chest pain.  Gastrointestinal:  Positive for blood in stool. Negative for abdominal pain, nausea and vomiting.    OBJECTIVE:   Temp:  [97.5 F (36.4 C)-98 F (36.7 C)] 98 F (36.7 C) (09/25 0803) Pulse Rate:  [56-69] 69 (09/25 0803) Resp:  [15-20] 18 (09/25 0803) BP: (97-147)/(59-77) 139/66 (09/25 0803) SpO2:  [92 %-99 %] 94 % (09/25 0803) Last BM Date : 02/16/24 Physical Exam Constitutional:      General: She is not in acute distress.    Appearance: She is not ill-appearing, toxic-appearing or diaphoretic.  Cardiovascular:     Rate and Rhythm: Bradycardia present. Rhythm irregular.  Pulmonary:     Effort: No respiratory distress.     Breath sounds: Normal breath sounds.     Comments: Supplemental oxygen nasal cannula 2 liters. Abdominal:     General: Bowel sounds are normal. There is no distension.     Palpations: Abdomen is soft.     Tenderness: There is no abdominal tenderness. There is no guarding.  Musculoskeletal:        General: Tenderness (bilateral lower extremities to light palpation) present.     Right lower leg: Edema present.     Left lower leg: Edema present.  Skin:    General: Skin is warm and dry.  Neurological:     Mental Status: She is alert.     Comments: Dysarthria     Labs: Recent  Labs    02/16/24 0953 02/16/24 1550 02/16/24 2042 02/17/24 0350  WBC 3.7* 3.4*  --  3.3*  HGB 9.2* 8.2* 9.0* 8.5*  HCT 29.3* 26.9* 29.4* 28.0*  PLT 107* 105*  --  100*   BMET Recent Labs    02/16/24 0953 02/17/24 0350  NA 142 137  K 4.2 4.3  CL 102 101   CO2 27 24  GLUCOSE 153* 173*  BUN 25* 30*  CREATININE 4.61* 5.62*  CALCIUM  9.5 9.1   LFT Recent Labs    02/17/24 0350  PROT 5.6*  ALBUMIN 2.7*  AST 20  ALT 12  ALKPHOS 101  BILITOT 1.2   PT/INR Recent Labs    02/16/24 0953 02/17/24 0350  LABPROT 18.0* 17.3*  INR 1.4* 1.3*   Diagnostic imaging: No results found.  IMPRESSION: Hematochezia Acute blood loss anemia Atrial fibrillation on Eliquis  (last dose 02/15/24) ESRD on HD Tu/Th/Sa History CVA with dysarthria Type 2 diabetes mellitus with peripheral neuropathy  PLAN: -Check CT scan of abdomen and pelvis with IV contrast, discussed with Nephrology (Dr. Tobie) and given approval for contrast imaging -Monitor bowel movements, per nursing staff no BM this AM -Trend H/H, transfuse for Hgb < 7  -Can continue PPI therapy, though doubt upper GIB -Hold Eliquis  -If bleeding recurs/persists into this afternoon/evening, will consider colonoscopy, discussed this with patient and Doris over telephone, appears that they would like to pursue non-invasive therapies first, would consider colonoscopy if necessary, discussed risks of bleeding/infection/perforation/missed lesion/anesthesia, they verbalized understanding and elected to proceed if necessary -Further recommendations to follow pending imaging and clinical course   LOS: 1 day   Estefana Keas, DO [facility]

## 2024-02-17 NOTE — Progress Notes (Signed)
 PT Evaluation  Late Note  Pt admitted with/for BRBPR and pt presently weak, deconditioned and unable to relate her PLOF or follow direction well today.  Pt currently limited functionally due to the problems listed. ( See problems list.)   Pt will benefit from PT to maximize function and safety in order to get ready for next venue listed below.     02/17/24 1300  PT Visit Information  Last PT Received On 02/17/24  Assistance Needed +1  History of Present Illness Pt is a 63 y/o female presenting 9/24 with c/o BRBPR x 1 week.  Hemodynamically stable.  Hgb 8.2  PMH: ESRD on dialysis (TTS), prior CVA, A-Fib on Eliquis ,  Essential hypertension, hyperlipidemia, T2DM, OSA on CPAP, diabletic neuropathy  Precautions  Precautions Fall  Home Living  Family/patient expects to be discharged to: Private residence  Living Arrangements Children (pt state 2 children live with her)  Available Help at Discharge Family (sounds like she is rarely alone for much time)  Type of Home House  Home Access Stairs to enter  Entrance Stairs-Number of Steps 5  Entrance Stairs-Rails None  Home Layout One level  Bathroom Acupuncturist (2 wheels);Shower seat;BSC/3in1  Additional Comments Pt is expressively aphasic and answers are suspect.  Prior Function  Prior Level of Function  Other (comment) (unable to determine)  Pain Assessment  Pain Assessment Faces  Faces Pain Scale 0  Pain Intervention(s) Monitored during session  Cognition  Arousal Alert  Behavior During Therapy WFL for tasks assessed/performed  PT - Cognitive impairments Difficult to assess  Difficult to assess due to Impaired communication  Cueing  Cueing Techniques Verbal cues;Gestural cues  Communication  Communication Impaired  Factors Affecting Communication Difficulty expressing self  Upper Extremity Assessment  Upper Extremity Assessment Overall WFL for tasks assessed (mild weakness and  incoordination, but functional)  Lower Extremity Assessment  Lower Extremity Assessment  (mild weakness and incoordination.  R foot drags behind during gait,  can fully w/bear.)  Cervical / Trunk Assessment  Cervical / Trunk Assessment Normal  Bed Mobility  Overal bed mobility Needs Assistance  Bed Mobility Supine to Sit;Sit to Supine  Supine to sit Min assist  Sit to supine Mod assist  Transfers  Overall transfer level Needs assistance  Equipment used Rolling walker (2 wheels)  Transfers Sit to/from Stand;Bed to chair/wheelchair/BSC  Sit to Stand Mod assist;Min assist  Bed to/from chair/wheelchair/BSC transfer type: Stand pivot  Stand pivot transfers Mod assist  General transfer comment mod assist in the RW to transition to  [facility].  Pt used UE's on armrest appropriately with min for STS.  Ambulation/Gait  Ambulation/Gait assistance Min assist;Mod assist  Gait Distance (Feet) 20 Feet (in the room from [facility] in room back to the bed.)  Assistive device Rolling walker (2 wheels)  Gait Pattern/deviations Step-to pattern;Decreased step length - right  General Gait Details R LE trailing behind and off laterally during ambulation.  Cues for proximity to the RW.  Posture significantly flexed.  Assisted maneuvering the RW.  Gait velocity interpretation <1.31 ft/sec, indicative of household ambulator  Balance  Overall balance assessment Needs assistance  Sitting-balance support Single extremity supported;No upper extremity supported;Feet supported  Sitting balance-Leahy Scale Fair  Standing balance support Bilateral upper extremity supported;During functional activity;Reliant on assistive device for balance  Standing balance-Leahy Scale Poor  General Comments  General comments (skin integrity, edema, etc.) pt is a poor historian due to significant expressive aphasia which she appears  unaware of.  VSS on 2 L [place].  1 soft bloody stool.  PT - End of Session  Equipment Utilized During Treatment  Oxygen  Activity Tolerance Patient tolerated treatment well;Patient limited by fatigue  Patient left in bed;with call bell/phone within reach;with bed alarm set  Nurse Communication Mobility status;Other (comment) (need for another bowel movement)  PT Assessment  PT Recommendation/Assessment Patient needs continued PT services  PT Visit Diagnosis Unsteadiness on feet (R26.81);Other symptoms and signs involving the nervous system (R29.898)  PT Problem List Decreased strength;Decreased activity tolerance;Decreased balance;Decreased mobility;Decreased coordination  PT Plan  PT Frequency (ACUTE ONLY) Min 2X/week  PT Treatment/Interventions (ACUTE ONLY) DME instruction;Gait training;Functional mobility training;Therapeutic activities;Therapeutic exercise;Balance training;Neuromuscular re-education;Patient/family education  AM-PAC PT 6 Clicks Mobility Outcome Measure (Version 2)  Help needed turning from your back to your side while in a flat bed without using bedrails? 2  Help needed moving from lying on your back to sitting on the side of a flat bed without using bedrails? 2  Help needed moving to and from a bed to a chair (including a wheelchair)? 2  Help needed standing up from a chair using your arms (e.g., wheelchair or bedside chair)? 2  Help needed to walk in hospital room? 2  Help needed climbing 3-5 steps with a railing?  1  6 Click Score 11  Consider Recommendation of Discharge To: CIR/SNF/LTACH  Progressive Mobility  Mobility Referral No  Activity Ambulated with assistance;Pivoted/transferred to/from [facility]  PT Recommendation  Follow Up Recommendations Skilled nursing-short term rehab (<3 hours/day)  Can patient physically be transported by private vehicle No  Patient can return home with the following A lot of help with walking and/or transfers;A lot of help with bathing/dressing/bathroom;Assistance with cooking/housework;Assist for transportation  Functional Status Assessment  Patient has had a recent decline in their functional status and demonstrates the ability to make significant improvements in function in a reasonable and predictable amount of time.  PT equipment Other (comment) (TBD)  Individuals Consulted  Consulted and Agree with Results and Recommendations Patient unable/family or caregiver not available;Patient  Acute Rehab PT Goals  Patient Stated Goal pt unable to relate goals  PT Goal Formulation Patient unable to participate in goal setting  Time For Goal Achievement 03/02/24  Potential to Achieve Goals Good  PT Time Calculation  PT Start Time (ACUTE ONLY) 1159  PT Stop Time (ACUTE ONLY) 1232  PT Time Calculation (min) (ACUTE ONLY) 33 min  PT General Charges  $$ ACUTE PT VISIT 1 Visit  PT Evaluation  $PT Eval Moderate Complexity 1 Mod  PT Treatments  $Gait Training 8-22 mins   02/17/2024  India HERO., PT Acute Rehabilitation Services [phone]  (office)

## 2024-02-18 DIAGNOSIS — K922 Gastrointestinal hemorrhage, unspecified: Secondary | ICD-10-CM | POA: Diagnosis not present

## 2024-02-18 LAB — GLUCOSE, CAPILLARY
Glucose-Capillary: 45 mg/dL — ABNORMAL LOW (ref 70–99)
Glucose-Capillary: 48 mg/dL — ABNORMAL LOW (ref 70–99)
Glucose-Capillary: 147 mg/dL — ABNORMAL HIGH (ref 70–99)
Glucose-Capillary: 157 mg/dL — ABNORMAL HIGH (ref 70–99)
Glucose-Capillary: 196 mg/dL — ABNORMAL HIGH (ref 70–99)
Glucose-Capillary: 185 mg/dL — ABNORMAL HIGH (ref 70–99)

## 2024-02-18 LAB — CBC
HCT: 24.8 % — ABNORMAL LOW (ref 36.0–46.0)
Hemoglobin: 7.7 g/dL — ABNORMAL LOW (ref 12.0–15.0)
MCH: 29.6 pg (ref 26.0–34.0)
MCHC: 31 g/dL (ref 30.0–36.0)
MCV: 95.4 fL (ref 80.0–100.0)
Platelets: 107 K/uL — ABNORMAL LOW (ref 150–400)
RBC: 2.6 MIL/uL — ABNORMAL LOW (ref 3.87–5.11)
RDW: 16.8 % — ABNORMAL HIGH (ref 11.5–15.5)
WBC: 3.6 K/uL — ABNORMAL LOW (ref 4.0–10.5)
nRBC: 0 % (ref 0.0–0.2)

## 2024-02-18 LAB — BASIC METABOLIC PANEL WITH GFR
Anion gap: 10 (ref 5–15)
BUN: 18 mg/dL (ref 8–23)
CO2: 28 mmol/L (ref 22–32)
Calcium: 8.3 mg/dL — ABNORMAL LOW (ref 8.9–10.3)
Chloride: 96 mmol/L — ABNORMAL LOW (ref 98–111)
Creatinine, Ser: 4.02 mg/dL — ABNORMAL HIGH (ref 0.44–1.00)
GFR, Estimated: 12 mL/min — ABNORMAL LOW (ref >60)
Glucose, Bld: 85 mg/dL (ref 70–99)
Potassium: 4.2 mmol/L (ref 3.5–5.1)
Sodium: 134 mmol/L — ABNORMAL LOW (ref 135–145)

## 2024-02-18 LAB — HEPATITIS B SURFACE ANTIBODY, QUANTITATIVE: Hep B S AB Quant (Post): 153 m[IU]/mL

## 2024-02-18 LAB — HIV-1 RNA QUANT-NO REFLEX-BLD
HIV 1 RNA Quant: <20 {copies}/mL
LOG10 HIV-1 RNA: UNDETERMINED {Log_copies}/mL

## 2024-02-18 MED ORDER — DEXTROSE 50 % IV SOLN
INTRAVENOUS | Status: AC
  Administered 2024-02-18: 25 g via INTRAVENOUS
  Filled 2024-02-18: qty 50

## 2024-02-18 MED ORDER — DEXTROSE 50 % IV SOLN: 25.0000 g | INTRAVENOUS | Status: AC

## 2024-02-18 MED ORDER — SODIUM CHLORIDE 0.9 % IV SOLN: INTRAVENOUS | Status: AC

## 2024-02-18 MED ORDER — INSULIN GLARGINE 100 UNIT/ML ~~LOC~~ SOLN
5.0000 [IU] | Freq: Every day | SUBCUTANEOUS | Status: DC
  Filled 2024-02-18: qty 0.05

## 2024-02-18 MED ORDER — BISACODYL 5 MG PO TBEC
10.0000 mg | DELAYED_RELEASE_TABLET | Freq: Once | ORAL | Status: AC
  Administered 2024-02-18: 10 mg via ORAL
  Filled 2024-02-18: qty 2

## 2024-02-18 MED ORDER — PEG 3350-KCL-NA BICARB-NACL 420 G PO SOLR
4000.0000 mL | Freq: Once | ORAL | Status: AC
  Administered 2024-02-18: 4000 mL via ORAL
  Filled 2024-02-18: qty 4000

## 2024-02-18 NOTE — Anesthesia Preprocedure Evaluation (Addendum)
 Anesthesia Evaluation  Patient identified by MRN, date of birth, ID band Patient confused    Reviewed: Allergy & Precautions, NPO status , Patient's Chart, lab work & pertinent test results  History of Anesthesia Complications Negative for: history of anesthetic complications  Airway Mallampati: III  TM Distance: >3 FB Neck ROM: Full    Dental  (+) Dental Advisory Given   Pulmonary sleep apnea and Continuous Positive Airway Pressure Ventilation , pneumonia   Pulmonary exam normal        Cardiovascular hypertension, Pt. on medications + Peripheral Vascular Disease  Normal cardiovascular exam+ dysrhythmias Atrial Fibrillation and Ventricular Tachycardia    '22 TTE - EF 55 to 60%. There is moderate left ventricular hypertrophy. Aortic valve regurgitation is trivial.     Neuro/Psych  PSYCHIATRIC DISORDERS Anxiety      Neuromuscular disease CVA, Residual Symptoms    GI/Hepatic Neg liver ROS,GERD  Medicated and Controlled,,  Endo/Other  diabetes, Type 2, Insulin  DependentHypothyroidism   Obesity   Renal/GU ESRF and DialysisRenal disease     Musculoskeletal negative musculoskeletal ROS (+)    Abdominal   Peds  Hematology  (+) Blood dyscrasia, anemia  On eliquis  Plt 107k    Anesthesia Other Findings   Reproductive/Obstetrics                              Anesthesia Physical Anesthesia Plan  ASA: 3  Anesthesia Plan: MAC   Post-op Pain Management: Minimal or no pain anticipated   Induction:   PONV Risk Score and Plan: 2 and Propofol  infusion and Treatment may vary due to age or medical condition  Airway Management Planned: Natural Airway and Simple Face Mask  Additional Equipment: None  Intra-op Plan:   Post-operative Plan:   Informed Consent: I have reviewed the patients History and Physical, chart, labs and discussed the procedure including the risks, benefits and alternatives  for the proposed anesthesia with the patient or authorized representative who has indicated his/her understanding and acceptance.     Consent reviewed with POA  Plan Discussed with: CRNA and Anesthesiologist  Anesthesia Plan Comments:          Anesthesia Quick Evaluation

## 2024-02-18 NOTE — Progress Notes (Signed)
 Patient ID: Rachael Herrera, female   DOB: [birth year], 63 y.o.   MRN: [id number] [facility] Progress Note   Assessment/ Plan:   1.  Bright red blood per rectum: CT scan of the abdomen and pelvis was negative for any significant findings that could be the reason for her hematochezia. H/H drifting down with .  No triggers for colonoscopy unless bleeding recurs or persists at this time based on discussion by Gastroenterology with her family. Eliquis  on hold.  2.  End-stage renal disease: Will order for hemodialysis tomorrow to continue her usual outpatient TTS schedule.  Dialysis will be heparin  free.  She is hypervolemic and I will attempt to challenge EDW (limited by hypotension). 3.  Acute blood loss anemia: Secondary to hematochezia, hemoglobin levels trending down and I will order for ESA tomorrow. No indications for PRBC transfusion.  4.  Hypertension: Blood pressures currently appear to be under good control, will continue to monitor with hemodialysis/ultrafiltration. 5.  Secondary hyperparathyroidism: Calcium  level at goal, will add additional lab for phosphorus level to decide on need for binders.  Subjective:   Denies chest pain or shortness of breath. States that she has decreased hematochezia (none recorded by Herrera).   Objective:   BP (!) 110/45   Pulse (!) 50   Temp 97.7 F (36.5 C) (Oral)   Resp 16   Ht 5' 6 (1.676 m)   Wt 107.7 kg   SpO2 100%   BMI 38.32 kg/m   Physical Exam: Hzw:Rnfqnmujaob resting in bed, dysarthria noted on conversation  RCD:Elodz regular bradycardia, normal S1 and S2 Resp:Clear to auscultation bilaterally, no rales/rhonchi Jai:Dnqu, obese, non-tender, bowel sounds normal Ext: Bilateral 1+ pitting LE edema, left BCF with intact dressings   Labs: BMET Recent Labs  Lab 02/16/24 0953 02/17/24 0350 02/18/24 0419  NA 142 137 134*  K 4.2 4.3 4.2  CL 102 101 96*  CO2 27 24 28   GLUCOSE 153* 173* 85  BUN 25* 30* 18  CREATININE 4.61*  5.62* 4.02*  CALCIUM  9.5 9.1 8.3*   CBC Recent Labs  Lab 02/16/24 0953 02/16/24 1550 02/16/24 2042 02/17/24 0350 02/17/24 1800 02/18/24 0419  WBC 3.7* 3.4*  --  3.3*  --  3.6*  NEUTROABS 2.2  --   --   --   --   --   HGB 9.2* 8.2* 9.0* 8.5* 7.8* 7.7*  HCT 29.3* 26.9* 29.4* 28.0* 24.6* 24.8*  MCV 95.1 96.4  --  97.6  --  95.4  PLT 107* 105*  --  100*  --  107*      Medications:     sodium chloride    Intravenous Once   amLODipine   10 mg Oral Daily   atorvastatin   80 mg Oral QPM   Chlorhexidine  Gluconate Cloth  6 each Topical Q0600   cinacalcet   30 mg Oral Q breakfast   gabapentin   300 mg Oral Daily   insulin  aspart  0-5 Units Subcutaneous QHS   insulin  aspart  0-6 Units Subcutaneous TID WC   insulin  glargine  5 Units Subcutaneous Daily   levothyroxine   175 mcg Oral QAC breakfast   pantoprazole  (PROTONIX ) IV  40 mg Intravenous Q12H   sevelamer  carbonate  800 mg Oral TID WC   sodium chloride  flush  3 mL Intravenous Q12H   sodium chloride  flush  3 mL Intravenous Q12H   umeclidinium-vilanterol  1 puff Inhalation Daily   Gordy Blanch, MD 02/18/2024, 9:45 AM

## 2024-02-18 NOTE — H&P (View-Only) (Signed)
 [facility] Progress Note  SUBJECTIVE:   Interval history: Rachael Herrera was seen and evaluated today at bedside. Resting in bed. Hypoglycemic this AM requiring treatment. Difficult to obtain ROS given dysarthria, though denied abdominal pain, appears to be tolerating oral intake. Denied chest pain, shortness of breath. No BM this AM per nursing staff. Hgb trend noted. Agreeable to colonoscopy tomorrow.   Past Medical History:  Diagnosis Date   Cavitary pneumonia 06/2017   due to MSSA   Claudication of both lower extremities    DDD (degenerative disc disease), cervical    DDD (degenerative disc disease), lumbosacral    Diabetes mellitus (HCC)    Diabetic neuropathy (HCC)    ESRD (end stage renal disease) on dialysis (HCC)    GERD (gastroesophageal reflux disease)    NSVT (nonsustained ventricular tachycardia) (HCC) 10/20201   OSA on CPAP    Stroke (HCC)    Ulnar neuropathy    BUE   Vitamin B 12 deficiency    Past Surgical History:  Procedure Laterality Date   IR ANGIO VERTEBRAL SEL VERTEBRAL UNI L MOD SED  07/11/2020   IR CT HEAD LTD  07/11/2020   IR PERCUTANEOUS ART THROMBECTOMY/INFUSION INTRACRANIAL INC DIAG ANGIO  07/11/2020   IR US  GUIDE VASC ACCESS LEFT  07/12/2020   RADIOLOGY WITH ANESTHESIA N/A 07/11/2020   Procedure: IR WITH ANESTHESIA;  Surgeon: Radiologist, Medication, MD;  Location: [facility] OR;  Service: Radiology;  Laterality: N/A;   Current Facility-Administered Medications  Medication Dose Route Frequency Provider Last Rate Last Admin   0.9 %  sodium chloride  infusion (Manually program via Guardrails IV Fluids)   Intravenous Once Sundil, Subrina, MD       acetaminophen  (TYLENOL ) tablet 650 mg  650 mg Oral Q6H PRN Sundil, Subrina, MD   650 mg at 02/18/24 0515   Or   acetaminophen  (TYLENOL ) suppository 650 mg  650 mg Rectal Q6H PRN Sundil, Subrina, MD       atorvastatin  (LIPITOR ) tablet 80 mg  80 mg Oral QPM Sundil, Subrina, MD   80 mg at 02/17/24 2137    Chlorhexidine  Gluconate Cloth 2 % PADS 6 each  6 each Topical Q0600 Tobie Gordy POUR, MD   6 each at 02/17/24 1255   cinacalcet  (SENSIPAR ) tablet 30 mg  30 mg Oral Q breakfast Sundil, Subrina, MD   30 mg at 02/18/24 9070   gabapentin  (NEURONTIN ) capsule 300 mg  300 mg Oral Daily Sundil, Subrina, MD   300 mg at 02/18/24 9070   insulin  aspart (novoLOG ) injection 0-5 Units  0-5 Units Subcutaneous QHS Sundil, Subrina, MD   2 Units at 02/16/24 2048   insulin  aspart (novoLOG ) injection 0-6 Units  0-6 Units Subcutaneous TID WC Sundil, Subrina, MD   2 Units at 02/17/24 1255   levothyroxine  (SYNTHROID ) tablet 175 mcg  175 mcg Oral QAC breakfast Sundil, Subrina, MD   175 mcg at 02/18/24 0515   ondansetron  (ZOFRAN ) tablet 4 mg  4 mg Oral Q6H PRN Sundil, Subrina, MD       Or   ondansetron  (ZOFRAN ) injection 4 mg  4 mg Intravenous Q6H PRN Sundil, Subrina, MD       Oral care mouth rinse  15 mL Mouth Rinse PRN Sundil, Subrina, MD       pantoprazole  (PROTONIX ) injection 40 mg  40 mg Intravenous Q12H Sundil, Subrina, MD   40 mg at 02/18/24 0929   sevelamer  carbonate (RENVELA ) tablet 800 mg  800 mg Oral TID WC Sundil, Subrina,  MD   800 mg at 02/18/24 9070   sodium chloride  flush (NS) 0.9 % injection 3 mL  3 mL Intravenous Q12H Sundil, Subrina, MD   3 mL at 02/18/24 0955   sodium chloride  flush (NS) 0.9 % injection 3 mL  3 mL Intravenous Q12H Sundil, Subrina, MD   3 mL at 02/18/24 0955   sodium chloride  flush (NS) 0.9 % injection 3 mL  3 mL Intravenous PRN Sundil, Subrina, MD       umeclidinium-vilanterol (ANORO ELLIPTA ) 62.5-25 MCG/ACT 1 puff  1 puff Inhalation Daily Sundil, Subrina, MD       Allergies as of 02/16/2024   (No Known Allergies)   Review of Systems:  Review of Systems  Respiratory:  Negative for shortness of breath.   Cardiovascular:  Negative for chest pain.  Gastrointestinal:  Negative for abdominal pain.    OBJECTIVE:   Temp:  [97.6 F (36.4 C)-98.2 F (36.8 C)] 97.7 F (36.5 C) (09/25  2342) Pulse Rate:  [46-64] 50 (09/26 0700) Resp:  [11-18] 16 (09/26 0310) BP: (97-142)/(36-95) 97/48 (09/26 0727) SpO2:  [96 %-100 %] 100 % (09/26 0700) Weight:  [107.7 kg] 107.7 kg (09/25 1600) Last BM Date : 02/17/24 Physical Exam Constitutional:      General: She is not in acute distress.    Appearance: She is not ill-appearing, toxic-appearing or diaphoretic.  Cardiovascular:     Rate and Rhythm: Bradycardia present. Rhythm irregular.  Pulmonary:     Effort: No respiratory distress.     Breath sounds: Normal breath sounds.     Comments: Supplemental oxygen 2 liters nasal cannula. Abdominal:     General: Bowel sounds are normal. There is no distension.     Palpations: Abdomen is soft.     Tenderness: There is no abdominal tenderness. There is no guarding.  Musculoskeletal:     Right lower leg: Edema present.     Left lower leg: Edema present.  Skin:    General: Skin is warm and dry.  Neurological:     Mental Status: She is alert.     Labs: Recent Labs    02/16/24 1550 02/16/24 2042 02/17/24 0350 02/17/24 1800 02/18/24 0419  WBC 3.4*  --  3.3*  --  3.6*  HGB 8.2*   < > 8.5* 7.8* 7.7*  HCT 26.9*   < > 28.0* 24.6* 24.8*  PLT 105*  --  100*  --  107*   < > = values in this interval not displayed.   BMET Recent Labs    02/16/24 0953 02/17/24 0350 02/18/24 0419  NA 142 137 134*  K 4.2 4.3 4.2  CL 102 101 96*  CO2 27 24 28   GLUCOSE 153* 173* 85  BUN 25* 30* 18  CREATININE 4.61* 5.62* 4.02*  CALCIUM  9.5 9.1 8.3*   LFT Recent Labs    02/17/24 0350  PROT 5.6*  ALBUMIN 2.7*  AST 20  ALT 12  ALKPHOS 101  BILITOT 1.2   PT/INR Recent Labs    02/16/24 0953 02/17/24 0350  LABPROT 18.0* 17.3*  INR 1.4* 1.3*   Diagnostic imaging: CT ABDOMEN PELVIS W CONTRAST Result Date: 02/17/2024 CLINICAL DATA:  Hematochezia. EXAM: CT ABDOMEN AND PELVIS WITH CONTRAST TECHNIQUE: Multidetector CT imaging of the abdomen and pelvis was performed using the standard  protocol following bolus administration of intravenous contrast. RADIATION DOSE REDUCTION: This exam was performed according to the departmental dose-optimization program which includes automated exposure control, adjustment of the mA and/or kV  according to patient size and/or use of iterative reconstruction technique. CONTRAST:  75mL OMNIPAQUE  IOHEXOL  350 MG/ML SOLN COMPARISON:  CT scan abdomen and pelvis from 06/27/2023. FINDINGS: Lower chest: There is mild, smooth, circumferential thickening of the segmental and subsegmental bronchial walls, in the visualized bilateral lungs. There are associated bilateral small pleural effusions, right more than left and areas of smooth interlobular and interlobular septal thickening, compatible with congestive heart failure/pulmonary edema. There is mild cardiomegaly. No pericardial effusion. Hepatobiliary: The liver is normal in size. There is mild liver surface irregularity/nodularity, favoring cirrhosis. No focal lesion. No intrahepatic or extrahepatic bile duct dilation. No calcified gallstones. Normal gallbladder wall thickness. No pericholecystic inflammatory changes. Pancreas: Unremarkable. No pancreatic ductal dilatation or surrounding inflammatory changes. Spleen: Within normal limits. No focal lesion. Adrenals/Urinary Tract: Adrenal glands are unremarkable. Mild-to-moderate diffuse atrophy of bilateral kidneys again noted. There are multiple bilateral renal calculi with largest in the right kidney measuring up to 6 x 9 mm. No obstructive uropathy on either side. There is a 5 x 11 mm calculus in the left ureteropelvic junction region and another smaller calculus in the left upper ureter. There is also a 3 mm calculus in the right renal pelvis. Urinary bladder is under distended, precluding optimal assessment. However, no large mass or stones identified. There is mild-to-moderate perivesical fat stranding predominantly along the anterior wall, which is nonspecific but  can be seen with acute versus chronic cystitis. Correlate clinically and with urinalysis. Stomach/Bowel: No disproportionate dilation of the small or large bowel loops. No evidence of abnormal bowel wall thickening or inflammatory changes. The appendix is unremarkable. There are multiple diverticula mainly in the sigmoid colon, without imaging signs of diverticulitis. Vascular/Lymphatic: There is mild ascites. No walled-off abscess. No pneumoperitoneum. No abdominal or pelvic lymphadenopathy, by size criteria. No aneurysmal dilation of the major abdominal arteries. There are moderate peripheral atherosclerotic vascular calcifications of the aorta and its major branches. Reproductive: The uterus is surgically absent. No large adnexal mass. Other: Small periumbilical surgical scar/fat containing incisional hernia. There is mild-to-moderate anasarca. Musculoskeletal: No suspicious osseous lesions. There are mild multilevel degenerative changes in the visualized spine. IMPRESSION: 1. No acute inflammatory process identified within the abdomen or pelvis. 2. There is mild-to-moderate perivesical fat stranding, which is nonspecific but can be seen with acute versus chronic cystitis. Correlate clinically and with urinalysis. 3. Multiple bilateral renal calculi and bilateral ureteric calculi, essentially similar to the prior study from 06/27/2023 4. Findings compatible with congestive heart failure/pulmonary edema. 5. Multiple other nonacute observations, as described above. Aortic Atherosclerosis (ICD10-I70.0). Electronically Signed   By: Ree Molt M.D.   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 Dianna tomorrow -Further recommendations to follow pending procedure   LOS: 2 days   Estefana Keas, [facility]

## 2024-02-18 NOTE — Progress Notes (Signed)
 Inhaler meds was unavail earlier. I came to room to give pt her inhaler, currently pt is unavailable.  RN is aware, states she will try to give her the inhaler later if pt is available.

## 2024-02-18 NOTE — Progress Notes (Signed)
 [facility] Progress Note  SUBJECTIVE:   Interval history: Rachael Herrera was seen and evaluated today at bedside. Resting in bed. Hypoglycemic this AM requiring treatment. Difficult to obtain ROS given dysarthria, though denied abdominal pain, appears to be tolerating oral intake. Denied chest pain, shortness of breath. No BM this AM per nursing staff. Hgb trend noted. Agreeable to colonoscopy tomorrow.   Past Medical History:  Diagnosis Date   Cavitary pneumonia 06/2017   due to MSSA   Claudication of both lower extremities    DDD (degenerative disc disease), cervical    DDD (degenerative disc disease), lumbosacral    Diabetes mellitus (HCC)    Diabetic neuropathy (HCC)    ESRD (end stage renal disease) on dialysis (HCC)    GERD (gastroesophageal reflux disease)    NSVT (nonsustained ventricular tachycardia) (HCC) 10/20201   OSA on CPAP    Stroke (HCC)    Ulnar neuropathy    BUE   Vitamin B 12 deficiency    Past Surgical History:  Procedure Laterality Date   IR ANGIO VERTEBRAL SEL VERTEBRAL UNI L MOD SED  07/11/2020   IR CT HEAD LTD  07/11/2020   IR PERCUTANEOUS ART THROMBECTOMY/INFUSION INTRACRANIAL INC DIAG ANGIO  07/11/2020   IR US  GUIDE VASC ACCESS LEFT  07/12/2020   RADIOLOGY WITH ANESTHESIA N/A 07/11/2020   Procedure: IR WITH ANESTHESIA;  Surgeon: Radiologist, Medication, MD;  Location: [facility] OR;  Service: Radiology;  Laterality: N/A;   Current Facility-Administered Medications  Medication Dose Route Frequency Provider Last Rate Last Admin   0.9 %  sodium chloride  infusion (Manually program via Guardrails IV Fluids)   Intravenous Once Sundil, Subrina, MD       acetaminophen  (TYLENOL ) tablet 650 mg  650 mg Oral Q6H PRN Sundil, Subrina, MD   650 mg at 02/18/24 0515   Or   acetaminophen  (TYLENOL ) suppository 650 mg  650 mg Rectal Q6H PRN Sundil, Subrina, MD       atorvastatin  (LIPITOR ) tablet 80 mg  80 mg Oral QPM Sundil, Subrina, MD   80 mg at 02/17/24 2137    Chlorhexidine  Gluconate Cloth 2 % PADS 6 each  6 each Topical Q0600 Tobie Gordy POUR, MD   6 each at 02/17/24 1255   cinacalcet  (SENSIPAR ) tablet 30 mg  30 mg Oral Q breakfast Sundil, Subrina, MD   30 mg at 02/18/24 9070   gabapentin  (NEURONTIN ) capsule 300 mg  300 mg Oral Daily Sundil, Subrina, MD   300 mg at 02/18/24 9070   insulin  aspart (novoLOG ) injection 0-5 Units  0-5 Units Subcutaneous QHS Sundil, Subrina, MD   2 Units at 02/16/24 2048   insulin  aspart (novoLOG ) injection 0-6 Units  0-6 Units Subcutaneous TID WC Sundil, Subrina, MD   2 Units at 02/17/24 1255   levothyroxine  (SYNTHROID ) tablet 175 mcg  175 mcg Oral QAC breakfast Sundil, Subrina, MD   175 mcg at 02/18/24 0515   ondansetron  (ZOFRAN ) tablet 4 mg  4 mg Oral Q6H PRN Sundil, Subrina, MD       Or   ondansetron  (ZOFRAN ) injection 4 mg  4 mg Intravenous Q6H PRN Sundil, Subrina, MD       Oral care mouth rinse  15 mL Mouth Rinse PRN Sundil, Subrina, MD       pantoprazole  (PROTONIX ) injection 40 mg  40 mg Intravenous Q12H Sundil, Subrina, MD   40 mg at 02/18/24 0929   sevelamer  carbonate (RENVELA ) tablet 800 mg  800 mg Oral TID WC Sundil, Subrina,  MD   800 mg at 02/18/24 9070   sodium chloride  flush (NS) 0.9 % injection 3 mL  3 mL Intravenous Q12H Sundil, Subrina, MD   3 mL at 02/18/24 0955   sodium chloride  flush (NS) 0.9 % injection 3 mL  3 mL Intravenous Q12H Sundil, Subrina, MD   3 mL at 02/18/24 0955   sodium chloride  flush (NS) 0.9 % injection 3 mL  3 mL Intravenous PRN Sundil, Subrina, MD       umeclidinium-vilanterol (ANORO ELLIPTA ) 62.5-25 MCG/ACT 1 puff  1 puff Inhalation Daily Sundil, Subrina, MD       Allergies as of 02/16/2024   (No Known Allergies)   Review of Systems:  Review of Systems  Respiratory:  Negative for shortness of breath.   Cardiovascular:  Negative for chest pain.  Gastrointestinal:  Negative for abdominal pain.    OBJECTIVE:   Temp:  [97.6 F (36.4 C)-98.2 F (36.8 C)] 97.7 F (36.5 C) (09/25  2342) Pulse Rate:  [46-64] 50 (09/26 0700) Resp:  [11-18] 16 (09/26 0310) BP: (97-142)/(36-95) 97/48 (09/26 0727) SpO2:  [96 %-100 %] 100 % (09/26 0700) Weight:  [107.7 kg] 107.7 kg (09/25 1600) Last BM Date : 02/17/24 Physical Exam Constitutional:      General: She is not in acute distress.    Appearance: She is not ill-appearing, toxic-appearing or diaphoretic.  Cardiovascular:     Rate and Rhythm: Bradycardia present. Rhythm irregular.  Pulmonary:     Effort: No respiratory distress.     Breath sounds: Normal breath sounds.     Comments: Supplemental oxygen 2 liters nasal cannula. Abdominal:     General: Bowel sounds are normal. There is no distension.     Palpations: Abdomen is soft.     Tenderness: There is no abdominal tenderness. There is no guarding.  Musculoskeletal:     Right lower leg: Edema present.     Left lower leg: Edema present.  Skin:    General: Skin is warm and dry.  Neurological:     Mental Status: She is alert.     Labs: Recent Labs    02/16/24 1550 02/16/24 2042 02/17/24 0350 02/17/24 1800 02/18/24 0419  WBC 3.4*  --  3.3*  --  3.6*  HGB 8.2*   < > 8.5* 7.8* 7.7*  HCT 26.9*   < > 28.0* 24.6* 24.8*  PLT 105*  --  100*  --  107*   < > = values in this interval not displayed.   BMET Recent Labs    02/16/24 0953 02/17/24 0350 02/18/24 0419  NA 142 137 134*  K 4.2 4.3 4.2  CL 102 101 96*  CO2 27 24 28   GLUCOSE 153* 173* 85  BUN 25* 30* 18  CREATININE 4.61* 5.62* 4.02*  CALCIUM  9.5 9.1 8.3*   LFT Recent Labs    02/17/24 0350  PROT 5.6*  ALBUMIN 2.7*  AST 20  ALT 12  ALKPHOS 101  BILITOT 1.2   PT/INR Recent Labs    02/16/24 0953 02/17/24 0350  LABPROT 18.0* 17.3*  INR 1.4* 1.3*   Diagnostic imaging: CT ABDOMEN PELVIS W CONTRAST Result Date: 02/17/2024 CLINICAL DATA:  Hematochezia. EXAM: CT ABDOMEN AND PELVIS WITH CONTRAST TECHNIQUE: Multidetector CT imaging of the abdomen and pelvis was performed using the standard  protocol following bolus administration of intravenous contrast. RADIATION DOSE REDUCTION: This exam was performed according to the departmental dose-optimization program which includes automated exposure control, adjustment of the mA and/or kV  according to patient size and/or use of iterative reconstruction technique. CONTRAST:  75mL OMNIPAQUE  IOHEXOL  350 MG/ML SOLN COMPARISON:  CT scan abdomen and pelvis from 06/27/2023. FINDINGS: Lower chest: There is mild, smooth, circumferential thickening of the segmental and subsegmental bronchial walls, in the visualized bilateral lungs. There are associated bilateral small pleural effusions, right more than left and areas of smooth interlobular and interlobular septal thickening, compatible with congestive heart failure/pulmonary edema. There is mild cardiomegaly. No pericardial effusion. Hepatobiliary: The liver is normal in size. There is mild liver surface irregularity/nodularity, favoring cirrhosis. No focal lesion. No intrahepatic or extrahepatic bile duct dilation. No calcified gallstones. Normal gallbladder wall thickness. No pericholecystic inflammatory changes. Pancreas: Unremarkable. No pancreatic ductal dilatation or surrounding inflammatory changes. Spleen: Within normal limits. No focal lesion. Adrenals/Urinary Tract: Adrenal glands are unremarkable. Mild-to-moderate diffuse atrophy of bilateral kidneys again noted. There are multiple bilateral renal calculi with largest in the right kidney measuring up to 6 x 9 mm. No obstructive uropathy on either side. There is a 5 x 11 mm calculus in the left ureteropelvic junction region and another smaller calculus in the left upper ureter. There is also a 3 mm calculus in the right renal pelvis. Urinary bladder is under distended, precluding optimal assessment. However, no large mass or stones identified. There is mild-to-moderate perivesical fat stranding predominantly along the anterior wall, which is nonspecific but  can be seen with acute versus chronic cystitis. Correlate clinically and with urinalysis. Stomach/Bowel: No disproportionate dilation of the small or large bowel loops. No evidence of abnormal bowel wall thickening or inflammatory changes. The appendix is unremarkable. There are multiple diverticula mainly in the sigmoid colon, without imaging signs of diverticulitis. Vascular/Lymphatic: There is mild ascites. No walled-off abscess. No pneumoperitoneum. No abdominal or pelvic lymphadenopathy, by size criteria. No aneurysmal dilation of the major abdominal arteries. There are moderate peripheral atherosclerotic vascular calcifications of the aorta and its major branches. Reproductive: The uterus is surgically absent. No large adnexal mass. Other: Small periumbilical surgical scar/fat containing incisional hernia. There is mild-to-moderate anasarca. Musculoskeletal: No suspicious osseous lesions. There are mild multilevel degenerative changes in the visualized spine. IMPRESSION: 1. No acute inflammatory process identified within the abdomen or pelvis. 2. There is mild-to-moderate perivesical fat stranding, which is nonspecific but can be seen with acute versus chronic cystitis. Correlate clinically and with urinalysis. 3. Multiple bilateral renal calculi and bilateral ureteric calculi, essentially similar to the prior study from 06/27/2023 4. Findings compatible with congestive heart failure/pulmonary edema. 5. Multiple other nonacute observations, as described above. Aortic Atherosclerosis (ICD10-I70.0). Electronically Signed   By: Ree Molt M.D.   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 Dianna tomorrow -Further recommendations to follow pending procedure   LOS: 2 days   Estefana Keas, [facility]

## 2024-02-18 NOTE — TOC Initial Note (Addendum)
 Transition of Care [facility]) - Initial/Assessment Note    Patient Details  Name: Rachael Herrera MRN: [id number] Date of Birth: 1960-08-10  [facility] CM/SW Contact:    Isaiah Public, LCSWA Phone Number: 02/18/2024, 10:26 AM  Clinical Narrative:                    CSW received consult for possible SNF placement at time of discharge. Due to patients current orientation CSW LVM with patients daughter Rachael Herrera. CSW awaiting call back to discuss  PT recommendation of SNF placement for patient at time of discharge. CSW to continue to follow and assist with discharge planning needs.     Update- CSW received call back from patients daughter Rachael Herrera. CSW spoke with patients daughter regarding PT recommendation of SNF placement for patient at time of discharge.Patients daughter reports PTA patient comes from home with support of daughter.Patients daughter expressed understanding of PT recommendation and politely declined SNF placement for patient at time of discharge. Patients daughter plans for patient to return home with her. Patient daughter would like for patient to receive [facility] PT. Patients daughter informed CSW that patient has used [facility] in [place] in the past. CM set up HHPT with [facility].  Patients daughter informed CSW that she transports patient to HD. Patients daughter reports patient has a PCP at [facility] in [place]. Patient daughter confirmed she will pick up patient when patient is medically ready for dc. All questions answered.No further questions reported at this time. CSW to continue to follow and assist with discharge planning needs.    Patient Goals and CMS Choice            Expected Discharge Plan and Services                                              Prior Living Arrangements/Services                       Activities of Daily Living   ADL Screening (condition at time of admission) Independently performs ADLs?: No Does the patient have a  NEW difficulty with bathing/dressing/toileting/self-feeding that is expected to last >3 days?: No Does the patient have a NEW difficulty with getting in/out of bed, walking, or climbing stairs that is expected to last >3 days?: No Does the patient have a NEW difficulty with communication that is expected to last >3 days?: No Is the patient deaf or have difficulty hearing?: No Does the patient have difficulty seeing, even when wearing glasses/contacts?: No Does the patient have difficulty concentrating, remembering, or making decisions?: Yes  Permission Sought/Granted                  Emotional Assessment              Admission diagnosis:  Rectal bleeding [K62.5] Acute GI bleeding [K92.2] Acute lower GI bleeding [K92.2] Patient Active Problem List   Diagnosis Date Noted   Acute GI bleeding 02/16/2024   Paroxysmal atrial fibrillation (HCC) 02/16/2024   History of CAD (coronary artery disease) 02/16/2024   PVD (peripheral vascular disease) 02/16/2024   Leukopenia 02/16/2024   Thrombocytopenia 02/16/2024   Peripheral neuropathy 02/16/2024   Reactive airway disease 02/16/2024   Generalized anxiety disorder 02/16/2024   Acute lower GI bleeding 02/16/2024   Acute on chronic respiratory failure with  hypoxia (HCC) 05/03/2021   Elevated brain natriuretic peptide (BNP) level 05/03/2021   Cavitary pneumonia 05/03/2021   Pneumonia due to COVID-19 virus 05/03/2021   Hyperlipidemia 05/03/2021   History of CVA (cerebrovascular accident) 05/03/2021   Atrial fibrillation, chronic (HCC) 05/03/2021   Intracerebral hemorrhage 07/29/2020   ESRD (end stage renal disease) (HCC) 07/23/2020   Bradycardia 07/23/2020   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   Acute ischemic left MCA stroke (HCC) 07/18/2020   Cerebral embolism with cerebral infarction 07/11/2020   Stroke (HCC) 07/11/2020   Insulin  dependent type 2 diabetes mellitus (HCC) 09/25/2019   Obstructive sleep apnea  11/21/2018   Acquired hypothyroidism 05/04/2014   Chronic kidney disease, stage IV (severe) (HCC) 05/04/2014   Essential hypertension 05/04/2014   Hyperlipidemia due to type 1 diabetes mellitus (HCC) 05/04/2014   Obesity 05/04/2014   PCP:  Patient, No Pcp Per Pharmacy:   [facility] [address] [place] [address] Phone: [phone] Fax: [phone]     Social Drivers of Health (SDOH) Social History: SDOH Screenings   Food Insecurity: Low Risk  (06/27/2023)   Received from [facility]  Housing: Low Risk  (06/27/2023)   Received from [facility]  Transportation Needs: No Transportation Needs (06/27/2023)   Received from [facility]  Utilities: Low Risk  (06/27/2023)   Received from [facility]  Tobacco Use: Low Risk  (02/16/2024)  Recent Concern: Tobacco Use - Medium Risk (02/15/2024)   Received from [facility]   SDOH Interventions:     Readmission Risk Interventions     No data to display

## 2024-02-18 NOTE — Plan of Care (Signed)

## 2024-02-18 NOTE — Progress Notes (Addendum)
 Hypoglycemic Event  CBG: 48   Treatment: 8 oz juice/soda and D50 25 mL (12.5 gm)  Symptoms: None  Follow-up CBG: Upfz:[id number] CBG Result:147   Possible Reasons for Event: Inadequate meal intake  Comments/MD notified: Dr. Odell Castor notified.    Waddell JONELLE Pa

## 2024-02-18 NOTE — Progress Notes (Signed)
 [facility] PROGRESS NOTE    Progress Note  Rachael Herrera  FMW:[id number] DOB: [birth year] DOA: 02/16/2024 PCP: Rachael Herrera     Brief Narrative:   Rachael Herrera is an 63 y.o. female past medical history of paroxysmal atrial fibrillation on Eliquis , diabetes mellitus type 2, CAD end-stage renal disease, chronic respiratory failure with hypoxia on 2 L at baseline essential hypertension presents to the ED complaining of bright red blood Herrera rectum for 1 week hemoglobin on admission is 8.2 usually ranges 10-3   Assessment/Plan:   Acute lower GI bleeding/acute blood loss anemia Eliquis  was held, GI was consulted. CT scan of the abdomen pelvis with contrast showed no acute inflammatory process some fat stranding around bladder.  Multiple renal calculi Hemoglobin is slowly drifting down this morning is 7.7. GI spoke with family they would like to pursue noninvasive therapies first they would consider colonoscopy if necessary, GI has recommended colonoscopy if she continues to bleed.  Paroxysmal atrial fibrillation (HCC): Holding Eliquis  due to GI bleed. She is currently rate controlled on no AV nodal agents.  End-stage renal disease: She appears fluid overloaded on physical exam. Nephrology was consulted she status post dialysis on 02/17/2024 CT scan of the abdomen pelvis showed some mild congestive heart failure with pulmonary edema  Secondary hyperparathyroidism: Continue phosphate binders.  Essential hypertension: Continue amlodipine .  Hyperlipidemia/PVD/history of CAD: Continue statins.  Insulin -dependent diabetes mellitus type 2: With an A1c of 8.0.  Hypothyroidism: Continue Synthroid .  Peripheral neuropathy:  Continue gabapentin .  Morbid obesity: She has been counseled.  DVT prophylaxis: scd Family Communication:noen Status is: Inpatient Remains inpatient appropriate because: Acute GI bleed    Code Status:     Code Status Orders  (From  admission, onward)           Start     Ordered   02/16/24 1910  Full code  Continuous       Question:  By:  Answer:  Consent: discussion documented in EHR   02/16/24 1910           Code Status History     Date Active Date Inactive Code Status Order ID Comments User Context   05/03/2021 0133 05/04/2021 1747 Full Code [id number]  Manfred Driver, DO Inpatient   07/23/2020 1958 08/01/2020 2332 Full Code [id number]  Laurita Cort DASEN, MD Inpatient   07/18/2020 2130 07/23/2020 1942 Full Code [id number]  Jerri Pfeiffer, MD Inpatient   07/18/2020 1809 07/18/2020 2130 Full Code [id number]  Maurice Sharlet GORMAN DEVONNA Inpatient   07/11/2020 1517 07/18/2020 1734 Full Code [id number]  Randel Norleen SAUNDERS, MD ED      Advance Directive Documentation    Flowsheet Row Most Recent Value  Type of Advance Directive Living will, Healthcare Power of Attorney  Pre-existing out of facility DNR order (yellow form or pink MOST form) --  MOST Form in Place? --      IV Access:   Peripheral IV   Procedures and diagnostic studies:   CT ABDOMEN PELVIS W CONTRAST Result Date: 02/17/2024 CLINICAL DATA:  Hematochezia. EXAM: CT ABDOMEN AND PELVIS WITH CONTRAST TECHNIQUE: Multidetector CT imaging of the abdomen and pelvis was performed using the standard protocol following bolus administration of intravenous contrast. RADIATION DOSE REDUCTION: This exam was performed according to the departmental dose-optimization program which includes automated exposure control, adjustment of the mA and/or kV according to patient size and/or use of iterative reconstruction technique. CONTRAST:  75mL OMNIPAQUE  IOHEXOL  350 MG/ML SOLN COMPARISON:  CT scan  abdomen and pelvis from 06/27/2023. FINDINGS: Lower chest: There is mild, smooth, circumferential thickening of the segmental and subsegmental bronchial walls, in the visualized bilateral lungs. There are associated bilateral small pleural effusions, right more than left and areas of smooth  interlobular and interlobular septal thickening, compatible with congestive heart failure/pulmonary edema. There is mild cardiomegaly. No pericardial effusion. Hepatobiliary: The liver is normal in size. There is mild liver surface irregularity/nodularity, favoring cirrhosis. No focal lesion. No intrahepatic or extrahepatic bile duct dilation. No calcified gallstones. Normal gallbladder wall thickness. No pericholecystic inflammatory changes. Pancreas: Unremarkable. No pancreatic ductal dilatation or surrounding inflammatory changes. Spleen: Within normal limits. No focal lesion. Adrenals/Urinary Tract: Adrenal glands are unremarkable. Mild-to-moderate diffuse atrophy of bilateral kidneys again noted. There are multiple bilateral renal calculi with largest in the right kidney measuring up to 6 x 9 mm. No obstructive uropathy on either side. There is a 5 x 11 mm calculus in the left ureteropelvic junction region and another smaller calculus in the left upper ureter. There is also a 3 mm calculus in the right renal pelvis. Urinary bladder is under distended, precluding optimal assessment. However, no large mass or stones identified. There is mild-to-moderate perivesical fat stranding predominantly along the anterior wall, which is nonspecific but can be seen with acute versus chronic cystitis. Correlate clinically and with urinalysis. Stomach/Bowel: No disproportionate dilation of the small or large bowel loops. No evidence of abnormal bowel wall thickening or inflammatory changes. The appendix is unremarkable. There are multiple diverticula mainly in the sigmoid colon, without imaging signs of diverticulitis. Vascular/Lymphatic: There is mild ascites. No walled-off abscess. No pneumoperitoneum. No abdominal or pelvic lymphadenopathy, by size criteria. No aneurysmal dilation of the major abdominal arteries. There are moderate peripheral atherosclerotic vascular calcifications of the aorta and its major branches.  Reproductive: The uterus is surgically absent. No large adnexal mass. Other: Small periumbilical surgical scar/fat containing incisional hernia. There is mild-to-moderate anasarca. Musculoskeletal: No suspicious osseous lesions. There are mild multilevel degenerative changes in the visualized spine. IMPRESSION: 1. No acute inflammatory process identified within the abdomen or pelvis. 2. There is mild-to-moderate perivesical fat stranding, which is nonspecific but can be seen with acute versus chronic cystitis. Correlate clinically and with urinalysis. 3. Multiple bilateral renal calculi and bilateral ureteric calculi, essentially similar to the prior study from 06/27/2023 4. Findings compatible with congestive heart failure/pulmonary edema. 5. Multiple other nonacute observations, as described above. Aortic Atherosclerosis (ICD10-I70.0). Electronically Signed   By: Ree Molt M.D.   On: 02/17/2024 16:02     Medical Consultants:   None.   Subjective:    Rachael Herrera Staff she relates she does not feel well has no new complaints besides, she relates she feels tired  Objective:    Vitals:   02/18/24 0440 02/18/24 0510 02/18/24 0550 02/18/24 0610  BP: (!) 119/49 (!) 111/55 (!) 108/49 (!) 113/44  Pulse: (!) 54 (!) 50 (!) 50 (!) 48  Resp:      Temp:      TempSrc:      SpO2: 100% 100% 100% 100%  Weight:      Height:       SpO2: 100 % O2 Flow Rate (L/min): 3 L/min   Intake/Output Summary (Last 24 hours) at 02/18/2024 0619 Last data filed at 02/17/2024 2115 Gross Herrera 24 hour  Intake 598 ml  Output 1500 ml  Net -902 ml   Filed Weights   02/16/24 0923 02/17/24 1600  Weight: 105.2 kg 107.7 kg  Exam: General exam: In no acute distress. Respiratory system: Good air movement and clear to auscultation. Cardiovascular system: S1 & S2 heard, RRR. No JVD. Gastrointestinal system: Abdomen is nondistended, soft and nontender.  Extremities: No pedal edema. Skin: No rashes, lesions or  ulcers Psychiatry: Judgement and insight appear normal. Mood & affect appropriate.    Data Reviewed:    Labs: Basic Metabolic Panel: Recent Labs  Lab 02/16/24 0953 02/17/24 0350 02/18/24 0419  NA 142 137 134*  K 4.2 4.3 4.2  CL 102 101 96*  CO2 27 24 28   GLUCOSE 153* 173* 85  BUN 25* 30* 18  CREATININE 4.61* 5.62* 4.02*  CALCIUM  9.5 9.1 8.3*   GFR Estimated Creatinine Clearance: 17.3 mL/min (A) (by C-G formula based on SCr of 4.02 mg/dL (H)). Liver Function Tests: Recent Labs  Lab 02/16/24 0953 02/17/24 0350  AST 22 20  ALT 11 12  ALKPHOS 134* 101  BILITOT 0.9 1.2  PROT 6.5 5.6*  ALBUMIN 3.7 2.7*   No results for input(s): LIPASE, AMYLASE in the last 168 hours. No results for input(s): AMMONIA in the last 168 hours. Coagulation profile Recent Labs  Lab 02/16/24 0953 02/17/24 0350  INR 1.4* 1.3*   COVID-19 Labs  No results for input(s): DDIMER, FERRITIN, LDH, CRP in the last 72 hours.  Lab Results  Component Value Date   SARSCOV2NAA POSITIVE (A) 05/02/2021   SARSCOV2NAA NEGATIVE 07/30/2020   SARSCOV2NAA NEGATIVE 07/11/2020    CBC: Recent Labs  Lab 02/16/24 0953 02/16/24 1550 02/16/24 2042 02/17/24 0350 02/17/24 1800 02/18/24 0419  WBC 3.7* 3.4*  --  3.3*  --  3.6*  NEUTROABS 2.2  --   --   --   --   --   HGB 9.2* 8.2* 9.0* 8.5* 7.8* 7.7*  HCT 29.3* 26.9* 29.4* 28.0* 24.6* 24.8*  MCV 95.1 96.4  --  97.6  --  95.4  PLT 107* 105*  --  100*  --  107*   Cardiac Enzymes: No results for input(s): CKTOTAL, CKMB, CKMBINDEX, TROPONINI in the last 168 hours. BNP (last 3 results) No results for input(s): PROBNP in the last 8760 hours. CBG: Recent Labs  Lab 02/16/24 2018 02/17/24 0834 02/17/24 1215 02/17/24 2142 02/17/24 2209  GLUCAP 227* 258* 238* 85 76   D-Dimer: No results for input(s): DDIMER in the last 72 hours. Hgb A1c: Recent Labs    02/16/24 1942  HGBA1C 8.0*   Lipid Profile: No results for input(s):  CHOL, HDL, LDLCALC, TRIG, CHOLHDL, LDLDIRECT in the last 72 hours. Thyroid function studies: No results for input(s): TSH, T4TOTAL, T3FREE, THYROIDAB in the last 72 hours.  Invalid input(s): FREET3 Anemia work up: No results for input(s): VITAMINB12, FOLATE, FERRITIN, TIBC, IRON, RETICCTPCT in the last 72 hours. Sepsis Labs: Recent Labs  Lab 02/16/24 0953 02/16/24 1550 02/17/24 0350 02/18/24 0419  WBC 3.7* 3.4* 3.3* 3.6*   Microbiology Recent Results (from the past 240 hours)  MRSA Next Gen by PCR, Nasal     Status: None   Collection Time: 02/16/24 11:30 PM  Result Value Ref Range Status   MRSA by PCR Next Gen NOT DETECTED NOT DETECTED Final    Comment: (NOTE) The GeneXpert MRSA Assay (FDA approved for NASAL specimens only), is one component of a comprehensive MRSA colonization surveillance program. It is not intended to diagnose MRSA infection nor to guide or monitor treatment for MRSA infections. Test performance is not FDA approved in patients less than 17 years old. Performed at  [facility], [address] [place], [place] [address]      Medications:    sodium chloride    Intravenous Once   amLODipine   10 mg Oral Daily   atorvastatin   80 mg Oral QPM   Chlorhexidine  Gluconate Cloth  6 each Topical Q0600   cinacalcet   30 mg Oral Q breakfast   gabapentin   300 mg Oral Daily   insulin  aspart  0-5 Units Subcutaneous QHS   insulin  aspart  0-6 Units Subcutaneous TID WC   insulin  glargine  10 Units Subcutaneous Daily   levothyroxine   175 mcg Oral QAC breakfast   pantoprazole  (PROTONIX ) IV  40 mg Intravenous Q12H   sevelamer  carbonate  800 mg Oral TID WC   sodium chloride  flush  3 mL Intravenous Q12H   sodium chloride  flush  3 mL Intravenous Q12H   umeclidinium-vilanterol  1 puff Inhalation Daily   Continuous Infusions:    LOS: 2 days   Erle Odell Castor  [facility]  02/18/2024, 6:19 AM

## 2024-02-19 ENCOUNTER — Inpatient Hospital Stay (HOSPITAL_COMMUNITY): Payer: Self-pay | Admitting: Anesthesiology

## 2024-02-19 ENCOUNTER — Encounter (HOSPITAL_COMMUNITY): Payer: Self-pay | Admitting: Internal Medicine

## 2024-02-19 ENCOUNTER — Encounter (HOSPITAL_COMMUNITY): Admission: EM | Disposition: A | Payer: Self-pay | Source: Home / Self Care | Attending: Internal Medicine

## 2024-02-19 DIAGNOSIS — K5730 Diverticulosis of large intestine without perforation or abscess without bleeding: Secondary | ICD-10-CM

## 2024-02-19 DIAGNOSIS — N186 End stage renal disease: Secondary | ICD-10-CM

## 2024-02-19 DIAGNOSIS — I639 Cerebral infarction, unspecified: Secondary | ICD-10-CM

## 2024-02-19 DIAGNOSIS — K922 Gastrointestinal hemorrhage, unspecified: Secondary | ICD-10-CM | POA: Diagnosis not present

## 2024-02-19 DIAGNOSIS — I120 Hypertensive chronic kidney disease with stage 5 chronic kidney disease or end stage renal disease: Secondary | ICD-10-CM

## 2024-02-19 DIAGNOSIS — K920 Hematemesis: Secondary | ICD-10-CM

## 2024-02-19 HISTORY — PX: COLONOSCOPY: SHX5424

## 2024-02-19 LAB — MAGNESIUM: Magnesium: 1.6 mg/dL — ABNORMAL LOW (ref 1.7–2.4)

## 2024-02-19 LAB — BASIC METABOLIC PANEL WITH GFR
Anion gap: 13 (ref 5–15)
Anion gap: 13 (ref 5–15)
BUN: 20 mg/dL (ref 8–23)
BUN: 20 mg/dL (ref 8–23)
CO2: 24 mmol/L (ref 22–32)
CO2: 22 mmol/L (ref 22–32)
Calcium: 8.2 mg/dL — ABNORMAL LOW (ref 8.9–10.3)
Calcium: 8.1 mg/dL — ABNORMAL LOW (ref 8.9–10.3)
Chloride: 91 mmol/L — ABNORMAL LOW (ref 98–111)
Chloride: 92 mmol/L — ABNORMAL LOW (ref 98–111)
Creatinine, Ser: 4.7 mg/dL — ABNORMAL HIGH (ref 0.44–1.00)
Creatinine, Ser: 4.82 mg/dL — ABNORMAL HIGH (ref 0.44–1.00)
GFR, Estimated: 10 mL/min — ABNORMAL LOW (ref >60)
GFR, Estimated: 9 mL/min — ABNORMAL LOW (ref >60)
Glucose, Bld: 166 mg/dL — ABNORMAL HIGH (ref 70–99)
Glucose, Bld: 147 mg/dL — ABNORMAL HIGH (ref 70–99)
Potassium: 4.4 mmol/L (ref 3.5–5.1)
Potassium: 4.4 mmol/L (ref 3.5–5.1)
Sodium: 128 mmol/L — ABNORMAL LOW (ref 135–145)
Sodium: 127 mmol/L — ABNORMAL LOW (ref 135–145)

## 2024-02-19 LAB — CBC
HCT: 26.4 % — ABNORMAL LOW (ref 36.0–46.0)
Hemoglobin: 8.2 g/dL — ABNORMAL LOW (ref 12.0–15.0)
MCH: 30 pg (ref 26.0–34.0)
MCHC: 31.1 g/dL (ref 30.0–36.0)
MCV: 96.7 fL (ref 80.0–100.0)
Platelets: 112 K/uL — ABNORMAL LOW (ref 150–400)
RBC: 2.73 MIL/uL — ABNORMAL LOW (ref 3.87–5.11)
RDW: 16.4 % — ABNORMAL HIGH (ref 11.5–15.5)
WBC: 4.5 K/uL (ref 4.0–10.5)
nRBC: 0 % (ref 0.0–0.2)

## 2024-02-19 LAB — GLUCOSE, CAPILLARY
Glucose-Capillary: 179 mg/dL — ABNORMAL HIGH (ref 70–99)
Glucose-Capillary: 158 mg/dL — ABNORMAL HIGH (ref 70–99)

## 2024-02-19 SURGERY — COLONOSCOPY
Anesthesia: Monitor Anesthesia Care

## 2024-02-19 MED ORDER — MAGNESIUM SULFATE 2 GM/50ML IV SOLN
2.0000 g | Freq: Once | INTRAVENOUS | Status: AC
  Administered 2024-02-19: 2 g via INTRAVENOUS
  Filled 2024-02-19: qty 50

## 2024-02-19 MED ORDER — QUETIAPINE FUMARATE 25 MG PO TABS: 25.0000 mg | ORAL_TABLET | Freq: Every evening | ORAL | Status: DC | PRN

## 2024-02-19 MED ORDER — PHENYLEPHRINE 80 MCG/ML (10ML) SYRINGE FOR IV PUSH (FOR BLOOD PRESSURE SUPPORT)
PREFILLED_SYRINGE | INTRAVENOUS | Status: DC | PRN
  Administered 2024-02-19: 160 ug via INTRAVENOUS
  Administered 2024-02-19: 80 ug via INTRAVENOUS

## 2024-02-19 MED ORDER — CALCIUM GLUCONATE-NACL 1-0.675 GM/50ML-% IV SOLN
1.0000 g | Freq: Once | INTRAVENOUS | Status: AC
  Administered 2024-02-19: 1000 mg via INTRAVENOUS
  Filled 2024-02-19: qty 50

## 2024-02-19 MED ORDER — MELATONIN 3 MG PO TABS
3.0000 mg | ORAL_TABLET | Freq: Every day | ORAL | Status: DC
  Administered 2024-02-20 (×2): 3 mg via ORAL
  Filled 2024-02-19 (×2): qty 1

## 2024-02-19 MED ORDER — FENTANYL CITRATE PF 50 MCG/ML IJ SOSY
12.5000 ug | PREFILLED_SYRINGE | Freq: Once | INTRAMUSCULAR | Status: AC
  Administered 2024-02-19: 12.5 ug via INTRAVENOUS
  Filled 2024-02-19: qty 1

## 2024-02-19 MED ORDER — EPHEDRINE SULFATE-NACL 50-0.9 MG/10ML-% IV SOSY
PREFILLED_SYRINGE | INTRAVENOUS | Status: DC | PRN
  Administered 2024-02-19 (×2): 5 mg via INTRAVENOUS

## 2024-02-19 MED ORDER — PROPOFOL 500 MG/50ML IV EMUL
INTRAVENOUS | Status: DC | PRN
  Administered 2024-02-19: 75 ug/kg/min via INTRAVENOUS

## 2024-02-19 MED ORDER — QUETIAPINE FUMARATE 25 MG PO TABS
25.0000 mg | ORAL_TABLET | Freq: Two times a day (BID) | ORAL | Status: DC | PRN
  Administered 2024-02-21: 25 mg via ORAL
  Filled 2024-02-19: qty 1

## 2024-02-19 MED ORDER — LIDOCAINE 2% (20 MG/ML) 5 ML SYRINGE
INTRAMUSCULAR | Status: DC | PRN
  Administered 2024-02-19: 100 mg via INTRAVENOUS

## 2024-02-19 NOTE — Op Note (Signed)
 [facility] Patient Name: Rachael Herrera Procedure Date : 02/19/2024 MRN: [id number] Attending MD: [name] , MD, [id number] Date of Birth: 1961-05-20 CSN: [id number] Age: 63 Admit Type: Inpatient Procedure:                Colonoscopy Indications:              This is the patient's first colonoscopy,                            Hematochezia Providers:                Jerrell KYM Sol, MD, Collene Edu, RN, Haskel Chris, Technician Referring MD:             hospital team Medicines:                Propofol  per Anesthesia, Monitored Anesthesia Care Complications:            No immediate complications. Estimated Blood Loss:     Estimated blood loss was minimal. Procedure:                Pre-Anesthesia Assessment:                           - Prior to the procedure, a History and Physical                            was performed, and patient medications and                            allergies were reviewed. The patient's tolerance of                            previous anesthesia was also reviewed. The risks                            and benefits of the procedure and the sedation                            options and risks were discussed with the patient.                            All questions were answered, and informed consent                            was obtained. Prior Anticoagulants: The patient has                            taken Eliquis  (apixaban ), last dose was 4 days                            prior to procedure. ASA Grade Assessment: III - A  patient with severe systemic disease. After                            reviewing the risks and benefits, the patient was                            deemed in satisfactory condition to undergo the                            procedure.                           After obtaining informed consent, the colonoscope                            was passed under direct  vision. Throughout the                            procedure, the patient's blood pressure, pulse, and                            oxygen saturations were monitored continuously. The                            [id number]-HQ190L ([id number]) Olympus colonoscope was                            introduced through the anus and advanced to the the                            cecum, identified by appendiceal orifice and                            ileocecal valve. The colonoscopy was somewhat                            difficult due to excessive bleeding. Successful                            completion of the procedure was aided by                            controlling the bleeding. The patient tolerated the                            procedure well. The quality of the bowel                            preparation was fair. The terminal ileum, ileocecal                            valve, appendiceal orifice, and rectum were  photographed. Scope In: 8:11:23 AM Scope Out: 8:30:23 AM Scope Withdrawal Time: 0 hours 15 minutes 37 seconds  Total Procedure Duration: 0 hours 19 minutes 0 seconds  Findings:      The perianal exam findings include non-thrombosed external hemorrhoids.      Red blood was found in the entire colon.      Multiple medium-mouthed and small-mouthed diverticula were found in the       sigmoid colon.      A single medium-sized localized angiodysplastic lesion with bleeding was       found in the transverse colon. Fulguration to stop the bleeding by argon       plasma was successful. Estimated blood loss: none.      Internal hemorrhoids were found during retroflexion. The hemorrhoids       were medium-sized and Grade I (internal hemorrhoids that do not       prolapse).      The terminal ileum appeared normal. Impression:               - Preparation of the colon was fair.                           - Non-thrombosed external hemorrhoids found on                             perianal exam.                           - Blood in the entire examined colon.                           - Diverticulosis in the sigmoid colon.                           - A single bleeding colonic angiodysplastic lesion.                            Treated with argon plasma coagulation (APC).                           - Internal hemorrhoids.                           - The examined portion of the ileum was normal.                           - No specimens collected. Recommendation:           - Clear liquid diet.                           - Repeat colonoscopy (date not yet determined) for                            screening purposes.                           - Continue present medications.                           -  Continue to hold Eliquis  until further notice. Procedure Code(s):        --- Professional ---                           [id number], Colonoscopy, flexible; with control of                            bleeding, any method Diagnosis Code(s):        --- Professional ---                           K92.1, Melena (includes Hematochezia)                           K55.21, Angiodysplasia of colon with hemorrhage                           K92.2, Gastrointestinal hemorrhage, unspecified                           K64.4, Residual hemorrhoidal skin tags                           K64.0, First degree hemorrhoids                           K57.30, Diverticulosis of large intestine without                            perforation or abscess without bleeding CPT copyright 2022 American [employer] Association. All rights reserved. The codes documented in this report are preliminary and upon coder review may  be revised to meet current compliance requirements. [name], MD 02/19/2024 8:45:58 AM This report has been signed electronically. Number of Addenda: 0

## 2024-02-19 NOTE — Progress Notes (Signed)
 Patient ID: Rachael Herrera Staff, female   DOB: November 25, 1960, 63 y.o.   MRN: [id number] [facility] Progress Note   Assessment/ Plan:   1.  Bright red blood per rectum: CT scan of the abdomen and pelvis was negative for obvious bleeding lesion and significant for sigmoid diverticulosis without diverticulitis.  She underwent colonoscopy today by Dr. Dianna showing a single bleeding angiodysplastic lesion treated with APC. 2.  End-stage renal disease: On schedule for heparin  free dialysis again today with efforts at challenging dry weight/volume unloading as permitted by blood pressures based on evidence of volume excess on physical exam and imaging. 3.  Acute blood loss anemia: Secondary to hematochezia, hemoglobin levels trending down and I will order for ESA tomorrow. No indications for PRBC transfusion.  4.  Hypertension: Blood pressures currently appear to be under good control, will continue to monitor with hemodialysis/ultrafiltration. 5.  Secondary hyperparathyroidism: Calcium  level at goal, will add additional lab for phosphorus level to decide on need for binders.  Subjective:   Denies any complaints when seen this morning and just got back from colonoscopy.  No hematochezia noted overnight.   Objective:   BP (!) 110/41 (BP Location: Right Arm)   Pulse (!) 57   Temp (!) 97.5 F (36.4 C)   Resp (!) 9   Ht 5' 6 (1.676 m)   Wt 107.7 kg   SpO2 100%   BMI 38.32 kg/m   Physical Exam: Hzw:Rnfqnmujaob resting in bed, dysarthria noted on conversation  RCD:Elodz regular bradycardia, normal S1 and S2 Resp:Clear to auscultation bilaterally, no rales/rhonchi Jai:Dnqu, obese, non-tender, bowel sounds normal Ext: Bilateral 1+ pitting LE edema, left BCF with intact dressings   Labs: BMET Recent Labs  Lab 02/16/24 0953 02/17/24 0350 02/18/24 0419 02/19/24 0102 02/19/24 0443  NA 142 137 134* 128* 127*  K 4.2 4.3 4.2 4.4 4.4  CL 102 101 96* 91* 92*  CO2 27 24 28 24 22    GLUCOSE 153* 173* 85 166* 147*  BUN 25* 30* 18 20 20   CREATININE 4.61* 5.62* 4.02* 4.70* 4.82*  CALCIUM  9.5 9.1 8.3* 8.2* 8.1*   CBC Recent Labs  Lab 02/16/24 0953 02/16/24 1550 02/16/24 2042 02/17/24 0350 02/17/24 1800 02/18/24 0419 02/19/24 0102  WBC 3.7* 3.4*  --  3.3*  --  3.6* 4.5  NEUTROABS 2.2  --   --   --   --   --   --   HGB 9.2* 8.2*   < > 8.5* 7.8* 7.7* 8.2*  HCT 29.3* 26.9*   < > 28.0* 24.6* 24.8* 26.4*  MCV 95.1 96.4  --  97.6  --  95.4 96.7  PLT 107* 105*  --  100*  --  107* 112*   < > = values in this interval not displayed.      Medications:     sodium chloride    Intravenous Once   atorvastatin   80 mg Oral QPM   Chlorhexidine  Gluconate Cloth  6 each Topical Q0600   cinacalcet   30 mg Oral Q breakfast   gabapentin   300 mg Oral Daily   insulin  aspart  0-5 Units Subcutaneous QHS   insulin  aspart  0-6 Units Subcutaneous TID WC   levothyroxine   175 mcg Oral QAC breakfast   melatonin  3 mg Oral QHS   pantoprazole  (PROTONIX ) IV  40 mg Intravenous Q12H   sevelamer  carbonate  800 mg Oral TID WC   sodium chloride  flush  3 mL Intravenous Q12H   sodium chloride   flush  3 mL Intravenous Q12H   umeclidinium-vilanterol  1 puff Inhalation Daily   Gordy Blanch, MD 02/19/2024, 10:07 AM

## 2024-02-19 NOTE — Progress Notes (Signed)
 Received patient in bed to unit.  Alert and oriented.  Informed consent signed and in chart.   TX duration:3.5 hours  Patient tolerated well.  Transported back to the room  Alert, without acute distress.  Hand-off given to patient's nurse.   Access used: Left AV Fistula upper arm Access issues: Patient kept bending arm setting machine off.  Clots in line after clearing dialyzer  Total UF removed:   02/19/24 2030  Vitals  Temp 98 F (36.7 C)  Temp Source Oral  BP (!) 119/54  MAP (mmHg) 70  Pulse Rate 61  ECG Heart Rate 61  Resp 14  Weight 101.9 kg  Type of Weight Post-Dialysis  Oxygen Therapy  SpO2 99 %  O2 Device Room Air  During Treatment Monitoring  Duration of HD Treatment -hour(s) 3.5 hour(s)  HD Safety Checks Performed Yes  Intra-Hemodialysis Comments Tx completed  Post Treatment  Dialyzer Clearance Lightly streaked  Liters Processed 73.9  Fluid Removed (mL) 2500 mL  Tolerated HD Treatment Yes  Post-Hemodialysis Comments Patient kept bending her arm setting machine off.  AVG/AVF Arterial Site Held (minutes) 7 minutes  AVG/AVF Venous Site Held (minutes) 7 minutes  Fistula / Graft Left Upper arm  No placement date or time found.   Placed prior to admission: Yes  Orientation: Left  Access Location: Upper arm  Site Condition No complications  Fistula / Graft Assessment Present;Thrill;Bruit  Status Deaccessed;Patent  Drainage Description None     Rachael Brasil LPN [facility]

## 2024-02-19 NOTE — Progress Notes (Addendum)
 [facility] PROGRESS NOTE    Progress Note  Rachael Herrera  FMW:[id number] DOB: [birth year] DOA: 02/16/2024 PCP: Patient, No Pcp Per     Brief Narrative:   Rachael Herrera is an 63 y.o. female past medical history of paroxysmal atrial fibrillation on Eliquis , diabetes mellitus type 2, CAD end-stage renal disease, chronic respiratory failure with hypoxia on 2 L at baseline essential hypertension presents to the ED complaining of bright red blood per rectum for 1 week hemoglobin on admission is 8.2 usually ranges 10-8   Assessment/Plan:   Acute lower GI bleeding/acute blood loss anemia in the setting of Eliquis  use Eliquis  was held, GI was consulted. Colonoscopy showed single bleeding colonic angiodysplastic treated with APC internal hemorrhoids. Hemoglobin has stabilized this morning is 8.2. Continue to hold Eliquis  for at least 72 hours. Recheck CBC tomorrow morning.  Paroxysmal atrial fibrillation (HCC): Holding Eliquis  due to GI bleed. She is currently rate controlled on no AV nodal agents.  End-stage renal disease on hemodialysis: She appears fluid overloaded on physical exam. CT scan of the abdomen pelvis showed some mild congestive heart failure with pulmonary edema Nephrology will try to  dialyze again 02/19/2024.  Secondary hyperparathyroidism: Continue phosphate binders.  Essential hypertension: Continue amlodipine .  Hyperlipidemia/PVD/history of CAD: Continue statins.  Insulin -dependent diabetes mellitus type 2: With an A1c of 8.0. Continue long-acting insulin  plus sliding scale.  Hypothyroidism: Continue Synthroid .  Peripheral neuropathy:  Continue gabapentin .  Morbid obesity: She has been counseled.  Acute confusional state: Probably due to medications during her endoscopy. Avoid Ativan's and benzodiazepines. Use Seroquel as needed as needed melatonin at night.  DVT prophylaxis: scd Family Communication:noen Status is: Inpatient Remains  inpatient appropriate because: Acute GI bleed    Code Status:     Code Status Orders  (From admission, onward)           Start     Ordered   02/16/24 1910  Full code  Continuous       Question:  By:  Answer:  Consent: discussion documented in EHR   02/16/24 1910           Code Status History     Date Active Date Inactive Code Status Order ID Comments User Context   05/03/2021 0133 05/04/2021 1747 Full Code [id number]  Manfred Driver, DO Inpatient   07/23/2020 1958 08/01/2020 2332 Full Code [id number]  Laurita Cort DASEN, MD Inpatient   07/18/2020 2130 07/23/2020 1942 Full Code [id number]  Jerri Pfeiffer, MD Inpatient   07/18/2020 1809 07/18/2020 2130 Full Code [id number]  Maurice Sharlet GORMAN DEVONNA Inpatient   07/11/2020 1517 07/18/2020 1734 Full Code [id number]  Randel Norleen SAUNDERS, MD ED      Advance Directive Documentation    Flowsheet Row Most Recent Value  Type of Advance Directive Living will, Healthcare Power of Attorney  Pre-existing out of facility DNR order (yellow form or pink MOST form) --  MOST Form in Place? --      IV Access:   Peripheral IV   Procedures and diagnostic studies:   CT ABDOMEN PELVIS W CONTRAST Result Date: 02/17/2024 CLINICAL DATA:  Hematochezia. EXAM: CT ABDOMEN AND PELVIS WITH CONTRAST TECHNIQUE: Multidetector CT imaging of the abdomen and pelvis was performed using the standard protocol following bolus administration of intravenous contrast. RADIATION DOSE REDUCTION: This exam was performed according to the departmental dose-optimization program which includes automated exposure control, adjustment of the mA and/or kV according to patient size and/or use of iterative reconstruction technique. CONTRAST:  75mL OMNIPAQUE  IOHEXOL  350 MG/ML SOLN COMPARISON:  CT scan abdomen and pelvis from 06/27/2023. FINDINGS: Lower chest: There is mild, smooth, circumferential thickening of the segmental and subsegmental bronchial walls, in the visualized bilateral lungs. There  are associated bilateral small pleural effusions, right more than left and areas of smooth interlobular and interlobular septal thickening, compatible with congestive heart failure/pulmonary edema. There is mild cardiomegaly. No pericardial effusion. Hepatobiliary: The liver is normal in size. There is mild liver surface irregularity/nodularity, favoring cirrhosis. No focal lesion. No intrahepatic or extrahepatic bile duct dilation. No calcified gallstones. Normal gallbladder wall thickness. No pericholecystic inflammatory changes. Pancreas: Unremarkable. No pancreatic ductal dilatation or surrounding inflammatory changes. Spleen: Within normal limits. No focal lesion. Adrenals/Urinary Tract: Adrenal glands are unremarkable. Mild-to-moderate diffuse atrophy of bilateral kidneys again noted. There are multiple bilateral renal calculi with largest in the right kidney measuring up to 6 x 9 mm. No obstructive uropathy on either side. There is a 5 x 11 mm calculus in the left ureteropelvic junction region and another smaller calculus in the left upper ureter. There is also a 3 mm calculus in the right renal pelvis. Urinary bladder is under distended, precluding optimal assessment. However, no large mass or stones identified. There is mild-to-moderate perivesical fat stranding predominantly along the anterior wall, which is nonspecific but can be seen with acute versus chronic cystitis. Correlate clinically and with urinalysis. Stomach/Bowel: No disproportionate dilation of the small or large bowel loops. No evidence of abnormal bowel wall thickening or inflammatory changes. The appendix is unremarkable. There are multiple diverticula mainly in the sigmoid colon, without imaging signs of diverticulitis. Vascular/Lymphatic: There is mild ascites. No walled-off abscess. No pneumoperitoneum. No abdominal or pelvic lymphadenopathy, by size criteria. No aneurysmal dilation of the major abdominal arteries. There are moderate  peripheral atherosclerotic vascular calcifications of the aorta and its major branches. Reproductive: The uterus is surgically absent. No large adnexal mass. Other: Small periumbilical surgical scar/fat containing incisional hernia. There is mild-to-moderate anasarca. Musculoskeletal: No suspicious osseous lesions. There are mild multilevel degenerative changes in the visualized spine. IMPRESSION: 1. No acute inflammatory process identified within the abdomen or pelvis. 2. There is mild-to-moderate perivesical fat stranding, which is nonspecific but can be seen with acute versus chronic cystitis. Correlate clinically and with urinalysis. 3. Multiple bilateral renal calculi and bilateral ureteric calculi, essentially similar to the prior study from 06/27/2023 4. Findings compatible with congestive heart failure/pulmonary edema. 5. Multiple other nonacute observations, as described above. Aortic Atherosclerosis (ICD10-I70.0). Electronically Signed   By: Ree Molt M.D.   On: 02/17/2024 16:02     Medical Consultants:   None.   Subjective:    Rachael Herrera slightly confused this morning, which she relates her abdomen does not hurt  Objective:    Vitals:   02/19/24 0740 02/19/24 0838 02/19/24 0845 02/19/24 0900  BP: (!) 92/40 (!) 86/39 (!) 119/51 (!) 110/41  Pulse: (!) 53 68 66 (!) 57  Resp: 14 14 17  (!) 9  Temp: (!) 97 F (36.1 C) (!) 97.5 F (36.4 C)  (!) 97.5 F (36.4 C)  TempSrc: Temporal     SpO2: 99% 98% 99% 100%  Weight:      Height:       SpO2: 100 % O2 Flow Rate (L/min): 2 L/min   Intake/Output Summary (Last 24 hours) at 02/19/2024 0922 Last data filed at 02/19/2024 0523 Gross per 24 hour  Intake 253.1 ml  Output --  Net 253.1 ml  Filed Weights   02/16/24 0923 02/17/24 1600  Weight: 105.2 kg 107.7 kg    Exam: General exam: In no acute distress. Respiratory system: Good air movement and clear to auscultation. Cardiovascular system: S1 & S2 heard, RRR. No  JVD. Gastrointestinal system: Abdomen is nondistended, soft and nontender.  Extremities: No pedal edema. Skin: No rashes, lesions or ulcers Psychiatry not urinary insight of medical condition, slightly confused this morning not agitated   Data Reviewed:    Labs: Basic Metabolic Panel: Recent Labs  Lab 02/16/24 0953 02/17/24 0350 02/18/24 0419 02/19/24 0102 02/19/24 0443  NA 142 137 134* 128* 127*  K 4.2 4.3 4.2 4.4 4.4  CL 102 101 96* 91* 92*  CO2 27 24 28 24 22   GLUCOSE 153* 173* 85 166* 147*  BUN 25* 30* 18 20 20   CREATININE 4.61* 5.62* 4.02* 4.70* 4.82*  CALCIUM  9.5 9.1 8.3* 8.2* 8.1*  MG  --   --   --   --  1.6*   GFR Estimated Creatinine Clearance: 14.5 mL/min (A) (by C-G formula based on SCr of 4.82 mg/dL (H)). Liver Function Tests: Recent Labs  Lab 02/16/24 0953 02/17/24 0350  AST 22 20  ALT 11 12  ALKPHOS 134* 101  BILITOT 0.9 1.2  PROT 6.5 5.6*  ALBUMIN 3.7 2.7*   No results for input(s): LIPASE, AMYLASE in the last 168 hours. No results for input(s): AMMONIA in the last 168 hours. Coagulation profile Recent Labs  Lab 02/16/24 0953 02/17/24 0350  INR 1.4* 1.3*   COVID-19 Labs  No results for input(s): DDIMER, FERRITIN, LDH, CRP in the last 72 hours.  Lab Results  Component Value Date   SARSCOV2NAA POSITIVE (A) 05/02/2021   SARSCOV2NAA NEGATIVE 07/30/2020   SARSCOV2NAA NEGATIVE 07/11/2020    CBC: Recent Labs  Lab 02/16/24 0953 02/16/24 1550 02/16/24 2042 02/17/24 0350 02/17/24 1800 02/18/24 0419 02/19/24 0102  WBC 3.7* 3.4*  --  3.3*  --  3.6* 4.5  NEUTROABS 2.2  --   --   --   --   --   --   HGB 9.2* 8.2* 9.0* 8.5* 7.8* 7.7* 8.2*  HCT 29.3* 26.9* 29.4* 28.0* 24.6* 24.8* 26.4*  MCV 95.1 96.4  --  97.6  --  95.4 96.7  PLT 107* 105*  --  100*  --  107* 112*   Cardiac Enzymes: No results for input(s): CKTOTAL, CKMB, CKMBINDEX, TROPONINI in the last 168 hours. BNP (last 3 results) No results for input(s):  PROBNP in the last 8760 hours. CBG: Recent Labs  Lab 02/18/24 0823 02/18/24 0839 02/18/24 1135 02/18/24 1655 02/18/24 2236  GLUCAP 45* 147* 157* 196* 185*   D-Dimer: No results for input(s): DDIMER in the last 72 hours. Hgb A1c: Recent Labs    02/16/24 1942  HGBA1C 8.0*   Lipid Profile: No results for input(s): CHOL, HDL, LDLCALC, TRIG, CHOLHDL, LDLDIRECT in the last 72 hours. Thyroid function studies: No results for input(s): TSH, T4TOTAL, T3FREE, THYROIDAB in the last 72 hours.  Invalid input(s): FREET3 Anemia work up: No results for input(s): VITAMINB12, FOLATE, FERRITIN, TIBC, IRON, RETICCTPCT in the last 72 hours. Sepsis Labs: Recent Labs  Lab 02/16/24 1550 02/17/24 0350 02/18/24 0419 02/19/24 0102  WBC 3.4* 3.3* 3.6* 4.5   Microbiology Recent Results (from the past 240 hours)  MRSA Next Gen by PCR, Nasal     Status: None   Collection Time: 02/16/24 11:30 PM  Result Value Ref Range Status   MRSA by PCR  Next Gen NOT DETECTED NOT DETECTED Final    Comment: (NOTE) The GeneXpert MRSA Assay (FDA approved for NASAL specimens only), is one component of a comprehensive MRSA colonization surveillance program. It is not intended to diagnose MRSA infection nor to guide or monitor treatment for MRSA infections. Test performance is not FDA approved in patients less than 64 years old. Performed at [facility], [address] [place], [place] [address]      Medications:    sodium chloride    Intravenous Once   atorvastatin   80 mg Oral QPM   Chlorhexidine  Gluconate Cloth  6 each Topical Q0600   cinacalcet   30 mg Oral Q breakfast   gabapentin   300 mg Oral Daily   insulin  aspart  0-5 Units Subcutaneous QHS   insulin  aspart  0-6 Units Subcutaneous TID WC   levothyroxine   175 mcg Oral QAC breakfast   pantoprazole  (PROTONIX ) IV  40 mg Intravenous Q12H   sevelamer  carbonate  800 mg Oral TID WC   sodium chloride  flush  3 mL  Intravenous Q12H   sodium chloride  flush  3 mL Intravenous Q12H   umeclidinium-vilanterol  1 puff Inhalation Daily   Continuous Infusions:  sodium chloride  Stopped (02/19/24 0451)      LOS: 3 days   Erle Odell Castor  [facility]  02/19/2024, 9:22 AM

## 2024-02-19 NOTE — Progress Notes (Signed)
 Patient observed in dialysis having word aphasia during the whole dialysis session.  Camellia Brasil LPN- [facility]

## 2024-02-19 NOTE — Plan of Care (Signed)
  Problem: Pain Managment: Goal: General experience of comfort will improve and/or be controlled Outcome: Progressing

## 2024-02-19 NOTE — Interval H&P Note (Signed)
 History and Physical Interval Note:  02/19/2024 7:47 AM  Rachael Herrera Staff  has presented today for surgery, with the diagnosis of Hematochezia, acute blood loss anemia.  The various methods of treatment have been discussed with the patient and family. After consideration of risks, benefits and other options for treatment, the patient has consented to  Procedure(s): COLONOSCOPY (N/A) as a surgical intervention.  The patient's history has been reviewed, patient examined, no change in status, stable for surgery.  I have reviewed the patient's chart and labs.  Questions were answered to the patient's satisfaction.     [name]

## 2024-02-19 NOTE — Transfer of Care (Signed)
 Immediate Anesthesia Transfer of Care Note  Patient: Rachael Herrera  Procedure(s) Performed: COLONOSCOPY EGD, WITH ARGON PLASMA COAGULATION  Patient Location: PACU  Anesthesia Type:General  Level of Consciousness: drowsy  Airway & Oxygen Therapy: Patient Spontanous Breathing and Patient connected to face mask oxygen  Post-op Assessment: Report given to RN and Post -op Vital signs reviewed and stable  Post vital signs: Reviewed and stable  Last Vitals:  Vitals Value Taken Time  BP 117/51 02/19/24 08:41  Temp    Pulse 67 02/19/24 08:43  Resp 9 02/19/24 08:43  SpO2 100 % 02/19/24 08:43  Vitals shown include unfiled device data.  Last Pain:  Vitals:   02/19/24 0740  TempSrc: Temporal  PainSc: 0-No pain         Complications: No notable events documented.

## 2024-02-19 NOTE — Anesthesia Postprocedure Evaluation (Signed)
 Anesthesia Post Note  Patient: Rachael Herrera  Procedure(s) Performed: COLONOSCOPY EGD, WITH ARGON PLASMA COAGULATION     Patient location during evaluation: PACU Anesthesia Type: MAC Level of consciousness: awake and confused Pain management: pain level controlled Vital Signs Assessment: post-procedure vital signs reviewed and stable Respiratory status: spontaneous breathing, nonlabored ventilation and respiratory function stable Cardiovascular status: stable and blood pressure returned to baseline Anesthetic complications: no   No notable events documented.  Last Vitals:  Vitals:   02/19/24 0845 02/19/24 0900  BP: (!) 119/51 (!) 110/41  Pulse: 66 (!) 57  Resp: 17 (!) 9  Temp:    SpO2: 99% 100%    Last Pain:  Vitals:   02/19/24 0838  TempSrc:   PainSc: Asleep                 Debby FORBES Like

## 2024-02-20 DIAGNOSIS — K922 Gastrointestinal hemorrhage, unspecified: Secondary | ICD-10-CM | POA: Diagnosis not present

## 2024-02-20 LAB — TYPE AND SCREEN
ABO/RH(D): O POS
Antibody Screen: NEGATIVE
Unit division: 0

## 2024-02-20 LAB — BASIC METABOLIC PANEL WITH GFR
Anion gap: 9 (ref 5–15)
BUN: 12 mg/dL (ref 8–23)
CO2: 24 mmol/L (ref 22–32)
Calcium: 7.9 mg/dL — ABNORMAL LOW (ref 8.9–10.3)
Chloride: 95 mmol/L — ABNORMAL LOW (ref 98–111)
Creatinine, Ser: 3.83 mg/dL — ABNORMAL HIGH (ref 0.44–1.00)
GFR, Estimated: 12 mL/min — ABNORMAL LOW (ref >60)
Glucose, Bld: 364 mg/dL — ABNORMAL HIGH (ref 70–99)
Potassium: 4.2 mmol/L (ref 3.5–5.1)
Sodium: 128 mmol/L — ABNORMAL LOW (ref 135–145)

## 2024-02-20 LAB — CBC
HCT: 25.3 % — ABNORMAL LOW (ref 36.0–46.0)
Hemoglobin: 7.9 g/dL — ABNORMAL LOW (ref 12.0–15.0)
MCH: 29.5 pg (ref 26.0–34.0)
MCHC: 31.2 g/dL (ref 30.0–36.0)
MCV: 94.4 fL (ref 80.0–100.0)
Platelets: 100 K/uL — ABNORMAL LOW (ref 150–400)
RBC: 2.68 MIL/uL — ABNORMAL LOW (ref 3.87–5.11)
RDW: 16.2 % — ABNORMAL HIGH (ref 11.5–15.5)
WBC: 3.9 K/uL — ABNORMAL LOW (ref 4.0–10.5)
nRBC: 0 % (ref 0.0–0.2)

## 2024-02-20 LAB — BPAM RBC
Blood Product Expiration Date: 202510252359
ISSUE DATE / TIME: 202509241809
Unit Type and Rh: 5100

## 2024-02-20 LAB — GLUCOSE, CAPILLARY
Glucose-Capillary: 457 mg/dL — ABNORMAL HIGH (ref 70–99)
Glucose-Capillary: 363 mg/dL — ABNORMAL HIGH (ref 70–99)
Glucose-Capillary: 233 mg/dL — ABNORMAL HIGH (ref 70–99)
Glucose-Capillary: 128 mg/dL — ABNORMAL HIGH (ref 70–99)

## 2024-02-20 MED ORDER — INSULIN GLARGINE 100 UNIT/ML ~~LOC~~ SOLN
10.0000 [IU] | Freq: Two times a day (BID) | SUBCUTANEOUS | Status: DC
Start: 2024-02-20 — End: 2024-02-21
  Administered 2024-02-20 (×2): 10 [IU] via SUBCUTANEOUS
  Filled 2024-02-20 (×5): qty 0.1

## 2024-02-20 MED ORDER — INSULIN ASPART 100 UNIT/ML IJ SOLN
2.0000 [IU] | Freq: Three times a day (TID) | INTRAMUSCULAR | Status: DC
  Administered 2024-02-20 – 2024-02-22 (×4): 2 [IU] via SUBCUTANEOUS

## 2024-02-20 MED ORDER — INSULIN ASPART 100 UNIT/ML IJ SOLN: 0.0000 [IU] | Freq: Every day | INTRAMUSCULAR | Status: DC

## 2024-02-20 MED ORDER — INSULIN ASPART 100 UNIT/ML IJ SOLN
0.0000 [IU] | Freq: Three times a day (TID) | INTRAMUSCULAR | Status: DC
  Administered 2024-02-20: 5 [IU] via SUBCUTANEOUS
  Administered 2024-02-20: 6 [IU] via SUBCUTANEOUS
  Administered 2024-02-20: 2 [IU] via SUBCUTANEOUS
  Administered 2024-02-21: 1 [IU] via SUBCUTANEOUS
  Administered 2024-02-22: 2 [IU] via SUBCUTANEOUS

## 2024-02-20 NOTE — Progress Notes (Signed)
  [facility] Progress Note   Subjective:   Seen in room - chronic dysphasia, but seems comfortable. S/p HD yesterday with 2.5L off. 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  Objective Vitals:   02/20/24 0038 02/20/24 0439 02/20/24 0739 02/20/24 0823  BP: (!) 112/93 129/73  (!) 133/49  Pulse: 67  (P) 66 66  Resp: 18 18 (P) 18   Temp: 97.6 F (36.4 C) 97.8 F (36.6 C) (P) 98.4 F (36.9 C) 97.8 F (36.6 C)  TempSrc: Oral Oral (P) Oral Oral  SpO2: 100% 100%    Weight:      Height:       Physical Exam General: Well appearing, NAD. Dysphasic speech Heart: RRR Lungs: CTAB Extremities: Trace BLE edema Dialysis Access: LUE AVF +t/b  Additional Objective Labs: Basic Metabolic Panel: Recent Labs  Lab 02/19/24 0102 02/19/24 0443 02/20/24 0332  NA 128* 127* 128*  K 4.4 4.4 4.2  CL 91* 92* 95*  CO2 24 22 24   GLUCOSE 166* 147* 364*  BUN 20 20 12   CREATININE 4.70* 4.82* 3.83*  CALCIUM  8.2* 8.1* 7.9*   Liver Function Tests: Recent Labs  Lab 02/16/24 0953 02/17/24 0350  AST 22 20  ALT 11 12  ALKPHOS 134* 101  BILITOT 0.9 1.2  PROT 6.5 5.6*  ALBUMIN 3.7 2.7*   CBC: Recent Labs  Lab 02/16/24 0953 02/16/24 1550 02/16/24 2042 02/17/24 0350 02/17/24 1800 02/18/24 0419 02/19/24 0102 02/20/24 0332  WBC 3.7* 3.4*  --  3.3*  --  3.6* 4.5 3.9*  NEUTROABS 2.2  --   --   --   --   --   --   --   HGB 9.2* 8.2*   < > 8.5*   < > 7.7* 8.2* 7.9*  HCT 29.3* 26.9*   < > 28.0*   < > 24.8* 26.4* 25.3*  MCV 95.1 96.4  --  97.6  --  95.4 96.7 94.4  PLT 107* 105*  --  100*  --  107* 112* 100*   < > = values in this interval not displayed.   Medications:   sodium chloride    Intravenous Once   atorvastatin   80 mg Oral QPM   Chlorhexidine  Gluconate Cloth  6 each Topical Q0600   cinacalcet   30 mg Oral Q breakfast   gabapentin   300 mg Oral Daily   insulin  aspart  0-5 Units Subcutaneous QHS   insulin  aspart  0-6 Units  Subcutaneous TID WC   insulin  aspart  2 Units Subcutaneous TID WC   insulin  glargine  10 Units Subcutaneous BID   levothyroxine   175 mcg Oral QAC breakfast   melatonin  3 mg Oral QHS   pantoprazole  (PROTONIX ) IV  40 mg Intravenous Q12H   sevelamer  carbonate  800 mg Oral TID WC   sodium chloride  flush  3 mL Intravenous Q12H   sodium chloride  flush  3 mL Intravenous Q12H   umeclidinium-vilanterol  1 puff Inhalation Daily    Dialysis Orders [facility] - TTS  Assessment/Plan: ABLA/GIB: S/p colonoscopy with single, 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 ESRD: Continue HD on TTS schedule - next HD 9/30  HTN/volume: BP stable, edema improving. Anemia of ESRD: Unclear when due for ESA, likely resume on discharge Secondary HPTH: Ca ok, no Phos this admit, continue binders. T2DM Hx CVA, dysphasia - unclear baseline.   Izetta Boehringer, PA-C 02/20/2024, 8:44 AM  [employer]

## 2024-02-20 NOTE — Plan of Care (Signed)

## 2024-02-20 NOTE — Progress Notes (Signed)
 [facility]astro[facility] Progress Note  Rachael Herrera 63 y.o. 10/30/1960   Subjective: Sitting in bedside chair. Denies abdominal pain. Loose brown stool this morning.  Objective: Vital signs: Vitals:   02/20/24 0823 02/20/24 1157  BP: (!) 133/49 (!) 121/55  Pulse: 66 (!) 58  Resp:  18  Temp: 97.8 F (36.6 C) 97.7 F (36.5 C)  SpO2:  99%    Physical Exam: Gen: alert, no acute distress, obese HEENT: anicteric sclera CV: RRR Chest: CTA B Abd: soft, nondistended, nontender, +BS  Lab Results: Recent Labs    02/19/24 0443 02/20/24 0332  NA 127* 128*  K 4.4 4.2  CL 92* 95*  CO2 22 24  GLUCOSE 147* 364*  BUN 20 12  CREATININE 4.82* 3.83*  CALCIUM  8.1* 7.9*  MG 1.6*  --    No results for input(s): AST, ALT, ALKPHOS, BILITOT, PROT, ALBUMIN in the last 72 hours. Recent Labs    02/19/24 0102 02/20/24 0332  WBC 4.5 3.9*  HGB 8.2* 7.9*  HCT 26.4* 25.3*  MCV 96.7 94.4  PLT 112* 100*      Assessment/Plan: Lower GI bleed - bleeding colonic AVM s/p argon plasma coagulation yesterday. Hgb 7.9. No further signs of bleeding. Restart Eliquis  tomorrow if Hgb stable. Supportive care. Will sign off. F/U with Dr. Kriss in 4-6 weeks.    Rachael Herrera 02/20/2024, 2:21 PM  Questions please call [phone] ID: Rachael Herrera Staff, female   DOB: 06/19/1960, 63 y.o.   MRN: [id number]

## 2024-02-20 NOTE — Progress Notes (Signed)
 [facility] PROGRESS NOTE    Progress Note  Rachael Herrera  FMW:[id number] DOB: 1960-05-27 DOA: 02/16/2024 PCP: Patient, No Pcp Per     Brief Narrative:   Rachael Herrera is an 63 y.o. female past medical history of paroxysmal atrial fibrillation on Eliquis , diabetes mellitus type 2, CAD end-stage renal disease, chronic respiratory failure with hypoxia on 2 L at baseline essential hypertension presents to the ED complaining of bright red blood per rectum for 1 week hemoglobin on admission is 8.2 usually ranges 10-8   Assessment/Plan:   Acute lower GI bleeding/acute blood loss anemia in the setting of Eliquis  use Eliquis  was held on admission, GI was consulted. Colonoscopy showed single bleeding colonic angiodysplastic treated with APC internal hemorrhoids. Globin is relatively stable, this morning 7.9. Continue to hold Eliquis  for at least 72 hours. Recheck a CBC in the morning.  Paroxysmal atrial fibrillation [facility] Holding Eliquis  due to GI bleed. She is currently rate controlled on no AV nodal agents.  End-stage renal disease on hemodialysis: She appears fluid overloaded on physical exam. CT scan of the abdomen pelvis showed some mild congestive heart failure with pulmonary edema Dialysis per nephrology.  Secondary hyperparathyroidism: Continue phosphate binders.  Essential hypertension: Continue amlodipine .  Hyperlipidemia/PVD/history of CAD: Continue statins.  Insulin -dependent diabetes mellitus type 2: With an A1c of 8.0. Blood glucose this morning is greater than 350.   Hypothyroidism: Continue Synthroid .  Peripheral neuropathy:  Continue gabapentin .  Morbid obesity: She has been counseled.  Acute confusional state: Probably due to medications during her endoscopy. Avoid Ativan's and benzodiazepines. Patient awake alert and oriented this morning. Much improved compared to yesterday.  DVT prophylaxis: scd Family Communication:noen Status is:  Inpatient Remains inpatient appropriate because: Acute GI bleed    Code Status:     Code Status Orders  (From admission, onward)           Start     Ordered   02/16/24 1910  Full code  Continuous       Question:  By:  Answer:  Consent: discussion documented in EHR   02/16/24 1910           Code Status History     Date Active Date Inactive Code Status Order ID Comments User Context   05/03/2021 0133 05/04/2021 1747 Full Code [id number]  Manfred Driver, DO Inpatient   07/23/2020 1958 08/01/2020 2332 Full Code [id number]  Laurita Cort DASEN, MD Inpatient   07/18/2020 2130 07/23/2020 1942 Full Code [id number]  Jerri Pfeiffer, MD Inpatient   07/18/2020 1809 07/18/2020 2130 Full Code [id number]  Maurice Sharlet GORMAN DEVONNA Inpatient   07/11/2020 1517 07/18/2020 1734 Full Code [id number]  Randel Norleen SAUNDERS, MD ED      Advance Directive Documentation    Flowsheet Row Most Recent Value  Type of Advance Directive Living will, Healthcare Power of Attorney  Pre-existing out of facility DNR order (yellow form or pink MOST form) --  MOST Form in Place? --      IV Access:   Peripheral IV   Procedures and diagnostic studies:   No results found.    Medical Consultants:   None.   Subjective:    Rachael Herrera Staff awake this morning she relates she feels much better.  Objective:    Vitals:   02/19/24 2038 02/19/24 2123 02/20/24 0038 02/20/24 0439  BP: (!) 121/53 (!) 123/102 (!) 112/93 129/73  Pulse: (!) 55 62 67   Resp: 13 15 18 18   Temp:  ROLLEN)  97.5 F (36.4 C) 97.6 F (36.4 C) 97.8 F (36.6 C)  TempSrc:  Oral Oral Oral  SpO2: 98% 100% 100% 100%  Weight:      Height:       SpO2: 100 % O2 Flow Rate (L/min): 2 L/min   Intake/Output Summary (Last 24 hours) at 02/20/2024 0653 Last data filed at 02/20/2024 0100 Gross per 24 hour  Intake 720 ml  Output 2500 ml  Net -1780 ml   Filed Weights   02/17/24 1600 02/19/24 1601 02/19/24 2030  Weight: 107.7 kg 105.4 kg 101.9 kg     Exam: General exam: In no acute distress. Respiratory system: Good air movement and clear to auscultation. Cardiovascular system: S1 & S2 heard, RRR. No JVD. Gastrointestinal system: Abdomen is nondistended, soft and nontender.  Extremities: No pedal edema. Skin: No rashes, lesions or ulcers Psychiatry: Judgement and insight appear normal. Mood & affect appropriate.   Data Reviewed:    Labs: Basic Metabolic Panel: Recent Labs  Lab 02/17/24 0350 02/18/24 0419 02/19/24 0102 02/19/24 0443 02/20/24 0332  NA 137 134* 128* 127* 128*  K 4.3 4.2 4.4 4.4 4.2  CL 101 96* 91* 92* 95*  CO2 24 28 24 22 24   GLUCOSE 173* 85 166* 147* 364*  BUN 30* 18 20 20 12   CREATININE 5.62* 4.02* 4.70* 4.82* 3.83*  CALCIUM  9.1 8.3* 8.2* 8.1* 7.9*  MG  --   --   --  1.6*  --    GFR Estimated Creatinine Clearance: 17.6 mL/min (A) (by C-G formula based on SCr of 3.83 mg/dL (H)). Liver Function Tests: Recent Labs  Lab 02/16/24 0953 02/17/24 0350  AST 22 20  ALT 11 12  ALKPHOS 134* 101  BILITOT 0.9 1.2  PROT 6.5 5.6*  ALBUMIN 3.7 2.7*   No results for input(s): LIPASE, AMYLASE in the last 168 hours. No results for input(s): AMMONIA in the last 168 hours. Coagulation profile Recent Labs  Lab 02/16/24 0953 02/17/24 0350  INR 1.4* 1.3*   COVID-19 Labs  No results for input(s): DDIMER, FERRITIN, LDH, CRP in the last 72 hours.  Lab Results  Component Value Date   SARSCOV2NAA POSITIVE (A) 05/02/2021   SARSCOV2NAA NEGATIVE 07/30/2020   SARSCOV2NAA NEGATIVE 07/11/2020    CBC: Recent Labs  Lab 02/16/24 0953 02/16/24 1550 02/16/24 2042 02/17/24 0350 02/17/24 1800 02/18/24 0419 02/19/24 0102 02/20/24 0332  WBC 3.7* 3.4*  --  3.3*  --  3.6* 4.5 3.9*  NEUTROABS 2.2  --   --   --   --   --   --   --   HGB 9.2* 8.2*   < > 8.5* 7.8* 7.7* 8.2* 7.9*  HCT 29.3* 26.9*   < > 28.0* 24.6* 24.8* 26.4* 25.3*  MCV 95.1 96.4  --  97.6  --  95.4 96.7 94.4  PLT 107* 105*  --   100*  --  107* 112* 100*   < > = values in this interval not displayed.   Cardiac Enzymes: No results for input(s): CKTOTAL, CKMB, CKMBINDEX, TROPONINI in the last 168 hours. BNP (last 3 results) No results for input(s): PROBNP in the last 8760 hours. CBG: Recent Labs  Lab 02/18/24 1135 02/18/24 1655 02/18/24 2236 02/19/24 1220 02/19/24 2214  GLUCAP 157* 196* 185* 179* 158*   D-Dimer: No results for input(s): DDIMER in the last 72 hours. Hgb A1c: No results for input(s): HGBA1C in the last 72 hours.  Lipid Profile: No results for input(s): CHOL, HDL,  LDLCALC, TRIG, CHOLHDL, LDLDIRECT in the last 72 hours. Thyroid function studies: No results for input(s): TSH, T4TOTAL, T3FREE, THYROIDAB in the last 72 hours.  Invalid input(s): FREET3 Anemia work up: No results for input(s): VITAMINB12, FOLATE, FERRITIN, TIBC, IRON, RETICCTPCT in the last 72 hours. Sepsis Labs: Recent Labs  Lab 02/17/24 0350 02/18/24 0419 02/19/24 0102 02/20/24 0332  WBC 3.3* 3.6* 4.5 3.9*   Microbiology Recent Results (from the past 240 hours)  MRSA Next Gen by PCR, Nasal     Status: None   Collection Time: 02/16/24 11:30 PM  Result Value Ref Range Status   MRSA by PCR Next Gen NOT DETECTED NOT DETECTED Final    Comment: (NOTE) The GeneXpert MRSA Assay (FDA approved for NASAL specimens only), is one component of a comprehensive MRSA colonization surveillance program. It is not intended to diagnose MRSA infection nor to guide or monitor treatment for MRSA infections. Test performance is not FDA approved in patients less than 18 years old. Performed at [facility], [address] [place], [place] [address]      Medications:    sodium chloride    Intravenous Once   atorvastatin   80 mg Oral QPM   Chlorhexidine  Gluconate Cloth  6 each Topical Q0600   cinacalcet   30 mg Oral Q breakfast   gabapentin   300 mg Oral Daily   insulin  aspart  0-5  Units Subcutaneous QHS   insulin  aspart  0-6 Units Subcutaneous TID WC   levothyroxine   175 mcg Oral QAC breakfast   melatonin  3 mg Oral QHS   pantoprazole  (PROTONIX ) IV  40 mg Intravenous Q12H   sevelamer  carbonate  800 mg Oral TID WC   sodium chloride  flush  3 mL Intravenous Q12H   sodium chloride  flush  3 mL Intravenous Q12H   umeclidinium-vilanterol  1 puff Inhalation Daily   Continuous Infusions:      LOS: 4 days   Erle Odell Castor  [facility]  02/20/2024, 6:53 AM

## 2024-02-21 ENCOUNTER — Encounter (HOSPITAL_COMMUNITY): Payer: Self-pay | Admitting: Gastroenterology

## 2024-02-21 DIAGNOSIS — E10649 Type 1 diabetes mellitus with hypoglycemia without coma: Secondary | ICD-10-CM | POA: Diagnosis not present

## 2024-02-21 DIAGNOSIS — K922 Gastrointestinal hemorrhage, unspecified: Secondary | ICD-10-CM | POA: Diagnosis not present

## 2024-02-21 LAB — BASIC METABOLIC PANEL WITH GFR
Anion gap: 10 (ref 5–15)
BUN: 20 mg/dL (ref 8–23)
CO2: 25 mmol/L (ref 22–32)
Calcium: 8.4 mg/dL — ABNORMAL LOW (ref 8.9–10.3)
Chloride: 96 mmol/L — ABNORMAL LOW (ref 98–111)
Creatinine, Ser: 4.97 mg/dL — ABNORMAL HIGH (ref 0.44–1.00)
GFR, Estimated: 9 mL/min — ABNORMAL LOW (ref >60)
Glucose, Bld: 24 mg/dL — CL (ref 70–99)
Potassium: 4.4 mmol/L (ref 3.5–5.1)
Sodium: 131 mmol/L — ABNORMAL LOW (ref 135–145)

## 2024-02-21 LAB — CBC
HCT: 24 % — ABNORMAL LOW (ref 36.0–46.0)
Hemoglobin: 7.6 g/dL — ABNORMAL LOW (ref 12.0–15.0)
MCH: 30 pg (ref 26.0–34.0)
MCHC: 31.7 g/dL (ref 30.0–36.0)
MCV: 94.9 fL (ref 80.0–100.0)
Platelets: 135 K/uL — ABNORMAL LOW (ref 150–400)
RBC: 2.53 MIL/uL — ABNORMAL LOW (ref 3.87–5.11)
RDW: 16.4 % — ABNORMAL HIGH (ref 11.5–15.5)
WBC: 4.6 K/uL (ref 4.0–10.5)
nRBC: 0 % (ref 0.0–0.2)

## 2024-02-21 LAB — GLUCOSE, CAPILLARY
Glucose-Capillary: 155 mg/dL — ABNORMAL HIGH (ref 70–99)
Glucose-Capillary: 146 mg/dL — ABNORMAL HIGH (ref 70–99)
Glucose-Capillary: 26 mg/dL — CL (ref 70–99)
Glucose-Capillary: 179 mg/dL — ABNORMAL HIGH (ref 70–99)
Glucose-Capillary: 145 mg/dL — ABNORMAL HIGH (ref 70–99)
Glucose-Capillary: 180 mg/dL — ABNORMAL HIGH (ref 70–99)

## 2024-02-21 LAB — HIV-1/HIV-2 QUALITATIVE RNA
Final Interpretation: NEGATIVE
HIV-1 RNA, Qualitative: NONREACTIVE
HIV-2 RNA, Qualitative: NONREACTIVE

## 2024-02-21 LAB — HIV-1/2 AB - DIFFERENTIATION
HIV 1 Ab: NONREACTIVE
HIV 2 Ab: NONREACTIVE
Note: NEGATIVE

## 2024-02-21 MED ORDER — SODIUM CHLORIDE 0.9 % IV SOLN: INTRAVENOUS | Status: DC

## 2024-02-21 MED ORDER — DEXTROSE 50 % IV SOLN
INTRAVENOUS | Status: AC
  Filled 2024-02-21: qty 50

## 2024-02-21 MED ORDER — DARBEPOETIN ALFA 100 MCG/0.5ML IJ SOSY
100.0000 ug | PREFILLED_SYRINGE | INTRAMUSCULAR | Status: DC
  Administered 2024-02-21: 100 ug via SUBCUTANEOUS
  Filled 2024-02-21: qty 0.5

## 2024-02-21 MED ORDER — DEXTROSE 50 % IV SOLN
25.0000 g | INTRAVENOUS | Status: AC
  Administered 2024-02-21: 25 g via INTRAVENOUS

## 2024-02-21 MED ORDER — INSULIN GLARGINE 100 UNIT/ML ~~LOC~~ SOLN
5.0000 [IU] | Freq: Every day | SUBCUTANEOUS | Status: DC
  Administered 2024-02-22: 5 [IU] via SUBCUTANEOUS
  Filled 2024-02-21: qty 0.05

## 2024-02-21 NOTE — Plan of Care (Signed)

## 2024-02-21 NOTE — Progress Notes (Signed)
 [facility] PROGRESS NOTE    Progress Note  Rachael Herrera  FMW:[id number] DOB: 1960/05/28 DOA: 02/16/2024 PCP: Patient, No Pcp Per     Brief Narrative:   Rachael Herrera is an 63 y.o. female past medical history of paroxysmal atrial fibrillation on Eliquis , diabetes mellitus type 2, CAD end-stage renal disease, chronic respiratory failure with hypoxia on 2 L at baseline essential hypertension presents to the ED complaining of bright red blood per rectum for 1 week hemoglobin on admission is 8.2 usually ranges 10-8   Assessment/Plan:   Acute lower GI bleeding/acute blood loss anemia in the setting of Eliquis  use Eliquis  was held on admission, GI was consulted. Colonoscopy showed single bleeding colonic angiodysplastic treated with APC. Hemoglobin this morning is 7.6.  Probably she has equilibrated. Check a CBC tomorrow, GI related is okay to start Eliquis . Recheck a CBC in the morning.  Paroxysmal atrial fibrillation [facility]): Held Eliquis  due to GI bleed. She is currently rate controlled on no AV nodal agents.  End-stage renal disease on hemodialysis: She appears fluid overloaded on physical exam. Renal on board scheduled for HD on 02/22/2024.  Secondary hyperparathyroidism: Continue phosphate binders.  Essential hypertension: Continue amlodipine .  Hyperlipidemia/PVD/history of CAD: Continue statins.  Acute metabolic encephalopathy likely due to hypoglycemia insulin -dependent diabetes mellitus type 2/hypoglycemia: With an A1c of 8.0. Blood glucose 25 this morning. She was given D50. Hold long-acting insulin  for today. Continue sliding scale. Discontinue Seroquel and melatonin.  Hypothyroidism: Continue Synthroid .  Peripheral neuropathy:  Continue gabapentin .  Morbid obesity: She has been counseled.  Acute confusional state: Probably due to medications during her endoscopy. Avoid Ativan's and benzodiazepines. Discontinue melatonin and Seroquel.  DVT  prophylaxis: scd Family Communication:noen Status is: Inpatient Remains inpatient appropriate because: Acute GI bleed    Code Status:     Code Status Orders  (From admission, onward)           Start     Ordered   02/16/24 1910  Full code  Continuous       Question:  By:  Answer:  Consent: discussion documented in EHR   02/16/24 1910           Code Status History     Date Active Date Inactive Code Status Order ID Comments User Context   05/03/2021 0133 05/04/2021 1747 Full Code [id number]  Manfred Driver, DO Inpatient   07/23/2020 1958 08/01/2020 2332 Full Code [id number]  Laurita Cort DASEN, MD Inpatient   07/18/2020 2130 07/23/2020 1942 Full Code [id number]  Jerri Pfeiffer, MD Inpatient   07/18/2020 1809 07/18/2020 2130 Full Code [id number]  Maurice Sharlet GORMAN DEVONNA Inpatient   07/11/2020 1517 07/18/2020 1734 Full Code [id number]  Randel Norleen SAUNDERS, MD ED      Advance Directive Documentation    Flowsheet Row Most Recent Value  Type of Advance Directive Living will, Healthcare Power of Attorney  Pre-existing out of facility DNR order (yellow form or pink MOST form) --  MOST Form in Place? --      IV Access:   Peripheral IV   Procedures and diagnostic studies:   No results found.    Medical Consultants:   None.   Subjective:    Rachael Herrera sleepy this morning.  Objective:    Vitals:   02/20/24 2016 02/20/24 2300 02/21/24 0314 02/21/24 0735  BP: (!) 111/50 (!) 115/47 (!) 104/48 (!) 97/43  Pulse: (!) 57 (!) 55 (!) 57 (!) 59  Resp: 19 20 18    Temp:  97.8 F (36.6 C) 97.8 F (36.6 C) 97.8 F (36.6 C)   TempSrc: Oral Oral Axillary   SpO2: 98%  100% 95%  Weight:      Height:       SpO2: 95 % O2 Flow Rate (L/min): 2 L/min FiO2 (%): 21 %   Intake/Output Summary (Last 24 hours) at 02/21/2024 0757 Last data filed at 02/20/2024 2208 Gross per 24 hour  Intake 840 ml  Output --  Net 840 ml   Filed Weights   02/17/24 1600 02/19/24 1601 02/19/24 2030  Weight:  107.7 kg 105.4 kg 101.9 kg    Exam: General exam: In no acute distress. Respiratory system: Good air movement and clear to auscultation. Cardiovascular system: S1 & S2 heard, RRR. No JVD. Gastrointestinal system: Abdomen is nondistended, soft and nontender.  Extremities: No pedal edema. Skin: No rashes, lesions or ulcers Psychiatry: Judgment and insight of medical condition.  Data Reviewed:    Labs: Basic Metabolic Panel: Recent Labs  Lab 02/18/24 0419 02/19/24 0102 02/19/24 0443 02/20/24 0332 02/21/24 0541  NA 134* 128* 127* 128* 131*  K 4.2 4.4 4.4 4.2 4.4  CL 96* 91* 92* 95* 96*  CO2 28 24 22 24 25   GLUCOSE 85 166* 147* 364* 24*  BUN 18 20 20 12 20   CREATININE 4.02* 4.70* 4.82* 3.83* 4.97*  CALCIUM  8.3* 8.2* 8.1* 7.9* 8.4*  MG  --   --  1.6*  --   --    GFR Estimated Creatinine Clearance: 13.6 mL/min (A) (by C-G formula based on SCr of 4.97 mg/dL (H)). Liver Function Tests: Recent Labs  Lab 02/16/24 0953 02/17/24 0350  AST 22 20  ALT 11 12  ALKPHOS 134* 101  BILITOT 0.9 1.2  PROT 6.5 5.6*  ALBUMIN 3.7 2.7*   No results for input(s): LIPASE, AMYLASE in the last 168 hours. No results for input(s): AMMONIA in the last 168 hours. Coagulation profile Recent Labs  Lab 02/16/24 0953 02/17/24 0350  INR 1.4* 1.3*   COVID-19 Labs  No results for input(s): DDIMER, FERRITIN, LDH, CRP in the last 72 hours.  Lab Results  Component Value Date   SARSCOV2NAA POSITIVE (A) 05/02/2021   SARSCOV2NAA NEGATIVE 07/30/2020   SARSCOV2NAA NEGATIVE 07/11/2020    CBC: Recent Labs  Lab 02/16/24 0953 02/16/24 1550 02/17/24 0350 02/17/24 1800 02/18/24 0419 02/19/24 0102 02/20/24 0332 02/21/24 0541  WBC 3.7*   < > 3.3*  --  3.6* 4.5 3.9* 4.6  NEUTROABS 2.2  --   --   --   --   --   --   --   HGB 9.2*   < > 8.5* 7.8* 7.7* 8.2* 7.9* 7.6*  HCT 29.3*   < > 28.0* 24.6* 24.8* 26.4* 25.3* 24.0*  MCV 95.1   < > 97.6  --  95.4 96.7 94.4 94.9  PLT 107*   < >  100*  --  107* 112* 100* 135*   < > = values in this interval not displayed.   Cardiac Enzymes: No results for input(s): CKTOTAL, CKMB, CKMBINDEX, TROPONINI in the last 168 hours. BNP (last 3 results) No results for input(s): PROBNP in the last 8760 hours. CBG: Recent Labs  Lab 02/20/24 0843 02/20/24 1154 02/20/24 1700 02/20/24 2102 02/21/24 0754  GLUCAP 457* 363* 233* 128* 146*   D-Dimer: No results for input(s): DDIMER in the last 72 hours. Hgb A1c: No results for input(s): HGBA1C in the last 72 hours.  Lipid Profile: No results for  input(s): CHOL, HDL, LDLCALC, TRIG, CHOLHDL, LDLDIRECT in the last 72 hours. Thyroid function studies: No results for input(s): TSH, T4TOTAL, T3FREE, THYROIDAB in the last 72 hours.  Invalid input(s): FREET3 Anemia work up: No results for input(s): VITAMINB12, FOLATE, FERRITIN, TIBC, IRON, RETICCTPCT in the last 72 hours. Sepsis Labs: Recent Labs  Lab 02/18/24 0419 02/19/24 0102 02/20/24 0332 02/21/24 0541  WBC 3.6* 4.5 3.9* 4.6   Microbiology Recent Results (from the past 240 hours)  MRSA Next Gen by PCR, Nasal     Status: None   Collection Time: 02/16/24 11:30 PM  Result Value Ref Range Status   MRSA by PCR Next Gen NOT DETECTED NOT DETECTED Final    Comment: (NOTE) The GeneXpert MRSA Assay (FDA approved for NASAL specimens only), is one component of a comprehensive MRSA colonization surveillance program. It is not intended to diagnose MRSA infection nor to guide or monitor treatment for MRSA infections. Test performance is not FDA approved in patients less than 67 years old. Performed at [facility], [address] [place], [place] [address]      Medications:    sodium chloride    Intravenous Once   atorvastatin   80 mg Oral QPM   Chlorhexidine  Gluconate Cloth  6 each Topical Q0600   cinacalcet   30 mg Oral Q breakfast   dextrose        gabapentin   300 mg Oral Daily    insulin  aspart  0-5 Units Subcutaneous QHS   insulin  aspart  0-6 Units Subcutaneous TID WC   insulin  aspart  2 Units Subcutaneous TID WC   insulin  glargine  10 Units Subcutaneous BID   levothyroxine   175 mcg Oral QAC breakfast   pantoprazole  (PROTONIX ) IV  40 mg Intravenous Q12H   sevelamer  carbonate  800 mg Oral TID WC   sodium chloride  flush  3 mL Intravenous Q12H   sodium chloride  flush  3 mL Intravenous Q12H   umeclidinium-vilanterol  1 puff Inhalation Daily   Continuous Infusions:      LOS: 5 days   Erle Odell Castor  [facility]  02/21/2024, 7:57 AM

## 2024-02-21 NOTE — Progress Notes (Signed)
   02/21/24 2018  BiPAP/CPAP/SIPAP  $ Non-Invasive Ventilator  Non-Invasive Vent Subsequent  BiPAP/CPAP/SIPAP Pt Type Adult  BiPAP/CPAP/SIPAP Resmed  Mask Size Small  IPAP 21 cmH20  EPAP 6 cmH2O  FiO2 (%) 21 %  Patient Home Machine No  Patient Home Mask No  Patient Home Tubing No  Device Plugged into RED Power Outlet Yes  BiPAP/CPAP /SiPAP Vitals  Pulse Rate (!) 52  Resp 18  SpO2 99 %  Bilateral Breath Sounds Clear

## 2024-02-21 NOTE — Progress Notes (Signed)
 [age]Y patient had hypoglycemic episode. BG = 26.  Notified Provider and Press photographer.  Dextrose  50% IV Stat given.    0754H BG = 146.  0900H Patient is alert and ready to eat.

## 2024-02-21 NOTE — Progress Notes (Signed)
  [facility] Progress Note   Subjective:  Seen in room - sleepy b/c just woke up. Denies CP/dyspnea, still with some mild abd pain.  Objective Vitals:   02/20/24 2300 02/21/24 0314 02/21/24 0735 02/21/24 1156  BP: (!) 115/47 (!) 104/48 (!) 97/43 (!) 116/53  Pulse: (!) 55 (!) 57 (!) 59 (!) 58  Resp: 20 18 16 17   Temp: 97.8 F (36.6 C) 97.8 F (36.6 C) 97.7 F (36.5 C) 98.1 F (36.7 C)  TempSrc: Oral Axillary Axillary Oral  SpO2:  100% 95%   Weight:      Height:       Physical Exam General: Well appearing, NAD. Dysphasic speech at times. Heart: RRR Lungs: CTAB Extremities: Trace BLE edema Dialysis Access: LUE AVF +t/b  Additional Objective Labs: Basic Metabolic Panel: Recent Labs  Lab 02/19/24 0443 02/20/24 0332 02/21/24 0541  NA 127* 128* 131*  K 4.4 4.2 4.4  CL 92* 95* 96*  CO2 22 24 25   GLUCOSE 147* 364* 24*  BUN 20 12 20   CREATININE 4.82* 3.83* 4.97*  CALCIUM  8.1* 7.9* 8.4*   Liver Function Tests: Recent Labs  Lab 02/16/24 0953 02/17/24 0350  AST 22 20  ALT 11 12  ALKPHOS 134* 101  BILITOT 0.9 1.2  PROT 6.5 5.6*  ALBUMIN 3.7 2.7*   CBC: Recent Labs  Lab 02/16/24 0953 02/16/24 1550 02/17/24 0350 02/17/24 1800 02/18/24 0419 02/19/24 0102 02/20/24 0332 02/21/24 0541  WBC 3.7*   < > 3.3*  --  3.6* 4.5 3.9* 4.6  NEUTROABS 2.2  --   --   --   --   --   --   --   HGB 9.2*   < > 8.5*   < > 7.7* 8.2* 7.9* 7.6*  HCT 29.3*   < > 28.0*   < > 24.8* 26.4* 25.3* 24.0*  MCV 95.1   < > 97.6  --  95.4 96.7 94.4 94.9  PLT 107*   < > 100*  --  107* 112* 100* 135*   < > = values in this interval not displayed.   Medications:   sodium chloride    Intravenous Once   atorvastatin   80 mg Oral QPM   Chlorhexidine  Gluconate Cloth  6 each Topical Q0600   cinacalcet   30 mg Oral Q breakfast   gabapentin   300 mg Oral Daily   insulin  aspart  0-5 Units Subcutaneous QHS   insulin  aspart  0-6 Units Subcutaneous TID WC   insulin  aspart  2 Units  Subcutaneous TID WC   [START ON 02/22/2024] insulin  glargine  5 Units Subcutaneous Daily   levothyroxine   175 mcg Oral QAC breakfast   pantoprazole  (PROTONIX ) IV  40 mg Intravenous Q12H   sevelamer  carbonate  800 mg Oral TID WC   sodium chloride  flush  3 mL Intravenous Q12H   sodium chloride  flush  3 mL Intravenous Q12H   umeclidinium-vilanterol  1 puff Inhalation Daily    Dialysis Orders [facility] - TTS - Tried to call her outpatient unit - went to VM.   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 ESRD: Continue HD on TTS schedule - next HD 9/30. HTN/volume: BP stable, edema improving. Anemia of ESRD: Unclear when due for ESA, will give today. Secondary HPTH: Ca ok, no Phos this admit, continue binders. T2DM Hx CVA, dysphasia - unclear baseline.   Izetta Boehringer, PA-C 02/21/2024, 12:30 PM  [employer]

## 2024-02-21 NOTE — Progress Notes (Signed)
 Physical Therapy Treatment Patient Details Name: Rachael Herrera MRN: [id number] DOB: [birth year] Today's Date: 02/21/2024   History of Present Illness Pt is a 63 y/o female presenting 9/24 with c/o BRBPR x 1 week.  Hemodynamically stable.  Hgb 8.2  PMH: ESRD on dialysis (TTS), prior CVA, A-Fib on Eliquis ,  Essential hypertension, hyperlipidemia, T2DM, OSA on CPAP, diabletic neuropathy    PT Comments  Pt sleeping soundly, but opens eyes briefly and provided initiation to get to EoB. However, pt quickly becomes total A for bringing to EoB and keeping upright. Despite multimodal stimuli pt does not rouse. HR 44-59 bpm during sitting EoB. Despite 8 min sitting supported EoB, pt does not rouse. Returned to supine with totalAx2. RN notified, with increased sternal rub in supine pt rouses but quickly falls back to sleep. D/c plans remain appropriate. PT will follow back for further treatment when pt is more alert   If plan is discharge home, recommend the following: A lot of help with walking and/or transfers;A lot of help with bathing/dressing/bathroom;Assistance with cooking/housework;Assist for transportation   Can travel by private vehicle     No  Equipment Recommendations  Other (comment) (TBD)       Precautions / Restrictions Precautions Precautions: Fall Restrictions Weight Bearing Restrictions Per Provider Order: No     Mobility  Bed Mobility Overal bed mobility: Needs Assistance Bed Mobility: Supine to Sit, Sit to Supine     Supine to sit: Total assist Sit to supine: Total assist   General bed mobility comments: pt total A for coming to EoB, unable to rouse at EoB despite, constant auditory and painful stimuli                  Balance Overall balance assessment: Needs assistance   Sitting balance-Leahy Scale: Zero                                      Communication Communication Communication: Impaired Factors Affecting Communication: Difficulty  expressing self  Cognition Arousal: Stuporous Behavior During Therapy: Flat affect   PT - Cognitive impairments: Difficult to assess Difficult to assess due to: Level of arousal                     PT - Cognition Comments: only responds to painful stimuli, even when brought to [employer] Techniques: Verbal cues, Gestural cues, Tactile cues     General Comments General comments (skin integrity, edema, etc.): pt HR 44-59, BP 115/98 sitting EoB      Pertinent Vitals/Pain Pain Assessment Pain Assessment: Faces Faces Pain Scale: No hurt     PT Goals (current goals can now be found in the care plan section) Acute Rehab PT Goals Patient Stated Goal: pt unable to relate goals PT Goal Formulation: Patient unable to participate in goal setting Time For Goal Achievement: 03/02/24 Potential to Achieve Goals: Good Progress towards PT goals: Not progressing toward goals - comment    Frequency    Min 2X/week       AM-PAC PT 6 Clicks Mobility   Outcome Measure  Help needed turning from your back to your side while in a flat bed without using bedrails?: Total Help needed moving from lying on your back to sitting on the side of a flat bed without using bedrails?: Total Help needed moving to and from a  bed to a chair (including a wheelchair)?: Total Help needed standing up from a chair using your arms (e.g., wheelchair or bedside chair)?: Total Help needed to walk in hospital room?: Total Help needed climbing 3-5 steps with a railing? : Total 6 Click Score: 6    End of Session Equipment Utilized During Treatment: Oxygen Activity Tolerance: Patient limited by fatigue;Other (comment) (level of consiousness) Patient left: in bed;with call bell/phone within reach;with bed alarm set;with nursing/sitter in room Nurse Communication: Mobility status;Other (comment) (LoC) PT Visit Diagnosis: Unsteadiness on feet (R26.81);Other symptoms and signs involving the  nervous system (R29.898)     Time: 8394-8369 PT Time Calculation (min) (ACUTE ONLY): 25 min  Charges:    $Therapeutic Activity: 23-37 mins PT General Charges $$ ACUTE PT VISIT: 1 Visit                     Juno Bozard B. Fleeta Lapidus PT, DPT Acute Rehabilitation Services Please use secure chat or  Call Office [phone]    Almarie KATHEE Fleeta [facility] 02/21/2024, 5:53 PM

## 2024-02-21 NOTE — Plan of Care (Signed)
   Problem: Nutrition: Goal: Adequate nutrition will be maintained Outcome: Progressing

## 2024-02-21 NOTE — Progress Notes (Signed)
 Concern for patient being too drowsy. Notified Provider. Provider came at bedside and do sternal rub to the patient. Patient became irritated, she removed the hand of the provider and woke up. Patient went back to sleep. [id number]: This RN woke up the patient again for the evening medicine and dinner. Patient woke up and took her meds and eat about 5% of her meal then goes back to sleep.

## 2024-02-22 DIAGNOSIS — K922 Gastrointestinal hemorrhage, unspecified: Secondary | ICD-10-CM | POA: Diagnosis not present

## 2024-02-22 LAB — BASIC METABOLIC PANEL WITH GFR
Anion gap: 9 (ref 5–15)
BUN: 26 mg/dL — ABNORMAL HIGH (ref 8–23)
CO2: 25 mmol/L (ref 22–32)
Calcium: 8.1 mg/dL — ABNORMAL LOW (ref 8.9–10.3)
Chloride: 95 mmol/L — ABNORMAL LOW (ref 98–111)
Creatinine, Ser: 6.21 mg/dL — ABNORMAL HIGH (ref 0.44–1.00)
GFR, Estimated: 7 mL/min — ABNORMAL LOW (ref >60)
Glucose, Bld: 225 mg/dL — ABNORMAL HIGH (ref 70–99)
Potassium: 4.5 mmol/L (ref 3.5–5.1)
Sodium: 129 mmol/L — ABNORMAL LOW (ref 135–145)

## 2024-02-22 LAB — CBC
HCT: 24.9 % — ABNORMAL LOW (ref 36.0–46.0)
Hemoglobin: 7.6 g/dL — ABNORMAL LOW (ref 12.0–15.0)
MCH: 29.1 pg (ref 26.0–34.0)
MCHC: 30.5 g/dL (ref 30.0–36.0)
MCV: 95.4 fL (ref 80.0–100.0)
Platelets: 121 K/uL — ABNORMAL LOW (ref 150–400)
RBC: 2.61 MIL/uL — ABNORMAL LOW (ref 3.87–5.11)
RDW: 16.7 % — ABNORMAL HIGH (ref 11.5–15.5)
WBC: 4.3 K/uL (ref 4.0–10.5)
nRBC: 0 % (ref 0.0–0.2)

## 2024-02-22 LAB — GLUCOSE, CAPILLARY
Glucose-Capillary: 248 mg/dL — ABNORMAL HIGH (ref 70–99)
Glucose-Capillary: 144 mg/dL — ABNORMAL HIGH (ref 70–99)

## 2024-02-22 MED ORDER — APIXABAN 5 MG PO TABS
5.0000 mg | ORAL_TABLET | Freq: Two times a day (BID) | ORAL | Status: DC
  Administered 2024-02-22: 5 mg via ORAL
  Filled 2024-02-22: qty 1

## 2024-02-22 MED ORDER — CINACALCET HCL 30 MG PO TABS: 30.0000 mg | ORAL_TABLET | Freq: Every day | ORAL | 0 refills | Status: AC

## 2024-02-22 MED ORDER — PANTOPRAZOLE SODIUM 40 MG PO TBEC: 40.0000 mg | DELAYED_RELEASE_TABLET | Freq: Two times a day (BID) | ORAL | Status: DC

## 2024-02-22 MED ORDER — LANTUS SOLOSTAR 100 UNIT/ML ~~LOC~~ SOPN: 5.0000 [IU] | PEN_INJECTOR | Freq: Every day | SUBCUTANEOUS | Status: AC

## 2024-02-22 NOTE — Progress Notes (Signed)
 D/C order noted. Contacted [facility] to be advised of pt's d/c today and that pt should resume care on Thursday. D/C summary and today's renal note faxed to clinic for continuation of care.   Randine Mungo Dialysis Navigator [phone]

## 2024-02-22 NOTE — Discharge Summary (Signed)
 Physician Discharge Summary  Rachael Herrera FMW:[id number] DOB: July 07, 1960 DOA: 02/16/2024  PCP: Patient, No Pcp Per  Admit date: 02/16/2024 Discharge date: 02/22/2024  Admitted from: Home Discharge disposition: Home with home health PT  Recommendations at discharge:  Your blood pressure is running in normal range without blood pressure medicines.  Ensure compliance with dialysis follow-up with GI as an outpatient Continue to monitor blood sugar level  Brief narrative: Rachael Herrera is a 63 y.o. female with PMH significant for ESRD-HD-TTS, obesity, OSA on CPAP, DM2, HTN, CAD, paroxysmal A-fib on Eliquis , CAD, h/o stroke with residual dysarthria, diabetic neuropathy, chronic respiratory failure on 2 L at baseline. Lives at home with her caregiver and daughter. 9/24, patient presented to ED with complaint of intermittent painless BRBPR for 1 week  Hemodynamically stable at presentation Rectal exam showed dark red blood Workup noted hemoglobin of 8.2, (baseline >9), platelet 105 CT abdomen pelvis did not show any acute intra-abdominal inflammatory process Admitted to [facility] was consulted.  Eliquis  was held. 9/27, colonoscopy showed blood in the entire colon, a single bleeding colonic angiodysplastic lesion that was treated with APC.  Also showed sigmoid colon diverticulosis, internal hemorrhoids and nonthrombosed external hemorrhoids. Bleeding stopped but hospital course was complicated by altered mental status, hypoglycemic episodes, physical deconditioning  Subjective: Patient was seen and examined this morning in dialysis. Not in distress.  Not much conversational. In the last 24 hours, afebrile, heart rate in 50s, blood pressure in low normal range, breathing on room air Most recent labs from this morning with hemoglobin 7.6, platelet 121, sodium 129, fasting blood glucose level elevated at 248 this morning.  It seems patient had a hypoglycemic episode yesterday morning with  insulin  level low at 26. Had a long conversation with patient's daughter this afternoon.  She states that patient mental status has been like this for last 3 years since the stroke.  Her blood sugar level also continues to fluctuate.  Daughter would prefer to take her home today.  Hospital course: Acute lower GI bleeding Presented with painless BRBPR in the setting of Eliquis  use GI consulted. 9/27, colonoscopy showed blood in the entire colon, a single bleeding colonic angiodysplastic lesion that was treated with APC.  Also showed sigmoid colon diverticulosis, internal hemorrhoids and nonthrombosed external hemorrhoids. Currently able to tolerate soft diet Per GI, needs repeat colonoscopy at a later date.  Continue to follow-up with GI as an outpatient.  Acute blood loss anemia   Baseline hemoglobin over 9.  Acute drop in hemoglobin due to BRBPR.   Bleeding has stopped after colonoscopy and APC.  Did not require any blood transfusion. No further bleeding. Hemoglobin trend as below, stable between 7 and 8. Per GI recommendation, restart Eliquis  today. Recent Labs    02/18/24 0419 02/19/24 0102 02/20/24 0332 02/21/24 0541 02/22/24 0510  HGB 7.7* 8.2* 7.9* 7.6* 7.6*  MCV 95.4 96.7 94.4 94.9 95.4   ESRD-HD-TTS Nephrology following. Continue Phos binders.  Type 2 diabetes mellitus Hypoglycemia A1c 8 on 02/16/2024 It seems patient had a hypoglycemic episode yesterday morning with insulin  level low at 26.  Lantus  was held which led to subsequent rise in blood sugar level. Fasting blood glucose level elevated at 248 this morning.  It seems Lantus  has been ordered for this morning at the reduced dose of 5 units. Per patient's daughter, her blood sugar level continues to fluctuate at home as well.  She is comfortable managing it at home. I would discharge on  only Lantus  5 units daily. Recent Labs  Lab 02/21/24 1158 02/21/24 1625 02/21/24 2044 02/22/24 0801 02/22/24 1309  GLUCAP  179* 145* 180* 248* 144*   Acute metabolic encephalopathy 9/29, altered mental status likely due to hypoglycemia. Seroquel and melatonin were held Mental status still remains altered.  This morning, patient is alert, awake and moving the extremities but only able to mumble.  Per daughter, this has been her mental status for last 3 years since the stroke.  Hypertension Previously on amlodipine .  Blood pressure currently controlled without meds.  I would not resume amlodipine  at discharge.   Paroxysmal atrial fibrillation  Remains in sinus rhythm. Not on any AV nodal blocking agent  Eliquis  plan as above  H/o CAD, PAD, HLD continue statin.  Hypothyroidism Continue Synthroid .   Peripheral neuropathy Continue gabapentin .   Morbid Obesity  Body mass index is 35.94 kg/m. Patient has been advised to make an attempt to improve diet and exercise patterns to aid in weight loss.   Chronic nephrolithiasis/urolithiasis CT scan on admission showed multiple bilateral renal calculi and ureteral calculi stable since February 2025. Not symptomatic currently.  Follow-up with urology as needed  Impaired mobility Patient was seen by PT.  SNF recommended.  But daughter prefers to to take her home.  Diet:  Diet Order             Diet general           DIET SOFT Room service appropriate? Yes; Fluid consistency: Thin  Diet effective 1400                   Nutritional status:  Body mass index is 35.94 kg/m.       Wounds:  - Wound 02/16/24 1900 Other (Comment) Toe (Comment  which one) Anterior;Left (Active)  Date First Assessed/Time First Assessed: 02/16/24 1900   Present on Original Admission: Yes  Primary Wound Type: Other (Comment)  Location: (c) Toe (Comment  which one)  Location Orientation: Anterior;Left    Assessments 02/16/2024  9:26 PM 02/22/2024  8:00 AM  Site / Wound Assessment Clean;Painful;Pink Clean;Dry  Peri-wound Assessment Erythema (blanchable) Intact  Drainage  Amount None --  Treatments Cleansed --  Dressing Type -- Foam - Lift dressing to assess site every shift  Dressing Changed New --  Dressing Status Clean, Dry, Intact Clean, Dry, Intact     No associated orders.     Wound 02/16/24 1900 Other (Comment) Toe (Comment  which one) Anterior;Right (Active)  Date First Assessed/Time First Assessed: 02/16/24 1900   Present on Original Admission: Yes  Primary Wound Type: Other (Comment)  Location: (c) Toe (Comment  which one)  Location Orientation: Anterior;Right    Assessments 02/16/2024  9:26 PM 02/22/2024  8:00 AM  Site / Wound Assessment Clean;Dry;Pink;Yellow Dry;Pink  Peri-wound Assessment Erythema (blanchable);Edema Erythema (non-blanchable)  Drainage Amount None None  Treatments Site care --  Dressing Type None None  Dressing Status -- None     No associated orders.    Discharge Medications:   Allergies as of 02/22/2024   No Known Allergies      Medication List     STOP taking these medications    amLODipine  10 MG tablet Commonly known as: NORVASC    losartan  50 MG tablet Commonly known as: COZAAR    torsemide  20 MG tablet Commonly known as: DEMADEX        TAKE these medications    albuterol  108 (90 Base) MCG/ACT inhaler Commonly known as: VENTOLIN  HFA Inhale  2 puffs into the lungs daily as needed for wheezing or shortness of breath.   apixaban  5 MG Tabs tablet Commonly known as: ELIQUIS  Take 1 tablet (5 mg total) by mouth 2 (two) times daily.   atorvastatin  80 MG tablet Commonly known as: LIPITOR  Take 80 mg by mouth daily.   cinacalcet  30 MG tablet Commonly known as: SENSIPAR  Take 1 tablet (30 mg total) by mouth daily with breakfast. Start taking on: February 23, 2024   diclofenac Sodium 1 % Gel Commonly known as: VOLTAREN Apply 1 Application topically as needed (pain).   Doxepin HCl 3 MG Tabs Take 3 mg by mouth at bedtime as needed (sleep).   gabapentin  100 MG capsule Commonly known as: NEURONTIN  Take  100 mg by mouth at bedtime.   insulin  lispro 100 UNIT/ML injection Commonly known as: HUMALOG Inject 0-14 Units into the skin 3 (three) times daily before meals. Per sliding scale   Lantus  SoloStar 100 UNIT/ML Solostar Pen Generic drug: insulin  glargine Inject 5 Units into the skin daily. What changed: how much to take   levothyroxine  175 MCG tablet Commonly known as: SYNTHROID  Take 175 mcg by mouth daily before breakfast.   omeprazole 20 MG capsule Commonly known as: PRILOSEC Take 20 mg by mouth daily.   sevelamer  carbonate 800 MG tablet Commonly known as: RENVELA  Take 800 mg by mouth 3 (three) times daily.   Trelegy Ellipta 100-62.5-25 MCG/ACT Aepb Generic drug: Fluticasone-Umeclidin-Vilant Inhale 2 puffs into the lungs daily.   vitamin E  180 MG (400 UNITS) capsule Take 400 Units by mouth daily.               Discharge Care Instructions  (From admission, onward)           Start     Ordered   02/22/24 0000  Discharge wound care:        02/22/24 1343             Follow ups:    Follow-up Information     [facility].   Specialty: Home Health Services Why: Physical Therapy-office to call with visit times. Contact information: [address] [place] [address] [phone]                 Discharge Instructions:   Discharge Instructions     Call MD for:  difficulty breathing, headache or visual disturbances   Complete by: As directed    Call MD for:  extreme fatigue   Complete by: As directed    Call MD for:  hives   Complete by: As directed    Call MD for:  persistant dizziness or light-headedness   Complete by: As directed    Call MD for:  persistant nausea and vomiting   Complete by: As directed    Call MD for:  severe uncontrolled pain   Complete by: As directed    Call MD for:  temperature >100.4   Complete by: As directed    Diet general   Complete by: As directed    Discharge  instructions   Complete by: As directed    Recommendations at discharge:   Your blood pressure is running in normal range without blood pressure medicines.   Ensure compliance with dialysis  follow-up with GI as an outpatient  Continue to monitor blood sugar level  General discharge instructions: Follow with Primary MD Patient, No Pcp Per in 7 days  Please request your PCP  to go  over your hospital tests, procedures, radiology results at the follow up. Please get your medicines reviewed and adjusted.  Your PCP may decide to repeat certain labs or tests as needed. Do not drive, operate heavy machinery, perform activities at heights, swimming or participation in water activities or provide baby sitting services if your were admitted for syncope or siezures until you have seen by Primary MD or a Neurologist and advised to do so again. [place]  Controlled Substance Reporting System database was reviewed. Do not drive, operate heavy machinery, perform activities at heights, swim, participate in water activities or provide baby-sitting services while on medications for pain, sleep and mood until your outpatient physician has reevaluated you and advised to do so again.  You are strongly recommended to comply with the dose, frequency and duration of prescribed medications. Activity: As tolerated with Full fall precautions use walker/cane & assistance as needed Avoid using any recreational substances like cigarette, tobacco, alcohol, or non-prescribed drug. If you experience worsening of your admission symptoms, develop shortness of breath, life threatening emergency, suicidal or homicidal thoughts you must seek medical attention immediately by calling 911 or calling your MD immediately  if symptoms less severe. You must read complete instructions/literature along with all the possible adverse reactions/side effects for all the medicines you take and that have been prescribed to you. Take any new  medicine only after you have completely understood and accepted all the possible adverse reactions/side effects.  Wear Seat belts while driving. You were cared for by a hospitalist during your hospital stay. If you have any questions about your discharge medications or the care you received while you were in the hospital after you are discharged, you can call the unit and ask to speak with the hospitalist or the covering physician. Once you are discharged, your primary care physician will handle any further medical issues. Please note that NO REFILLS for any discharge medications will be authorized once you are discharged, as it is imperative that you return to your primary care physician (or establish a relationship with a primary care physician if you do not have one).   Discharge wound care:   Complete by: As directed    Increase activity slowly   Complete by: As directed        Discharge Exam:   Vitals:   02/22/24 1200 02/22/24 1235 02/22/24 1240 02/22/24 1305  BP: (!) 114/51 (!) 119/57 (!) 115/50 (!) (P) 101/46  Pulse: (!) 46 (!) 53 (!) 48   Resp: (!) 9 12 14    Temp:   97.7 F (36.5 C) (!) (P) 97.5 F (36.4 C)  TempSrc:    (P) Oral  SpO2: 100% 98% 100%   Weight:   101 kg   Height:        Body mass index is 35.94 kg/m.  General exam: Pleasant, elderly African-American female.  Not in distress Skin: No rashes, lesions or ulcers. HEENT: Atraumatic, normocephalic, no obvious bleeding Lungs: Clear to auscultation bilaterally,  CVS: S1, S2, no murmur,   GI/Abd: Soft, nontender, nondistended, bowel sound present,   CNS: Alert, awake, mumbles, unable to answer questions which is her baseline apparently Psychiatry: Sad affect Extremities: No pedal edema, no calf tenderness,    The results of significant diagnostics from this hospitalization (including imaging, microbiology, ancillary and laboratory) are listed below for reference.    Procedures and Diagnostic Studies:   CT  ABDOMEN PELVIS W CONTRAST Result Date: 02/17/2024 CLINICAL DATA:  Hematochezia. EXAM: CT ABDOMEN AND  PELVIS WITH CONTRAST TECHNIQUE: Multidetector CT imaging of the abdomen and pelvis was performed using the standard protocol following bolus administration of intravenous contrast. RADIATION DOSE REDUCTION: This exam was performed according to the departmental dose-optimization program which includes automated exposure control, adjustment of the mA and/or kV according to patient size and/or use of iterative reconstruction technique. CONTRAST:  75mL OMNIPAQUE  IOHEXOL  350 MG/ML SOLN COMPARISON:  CT scan abdomen and pelvis from 06/27/2023. FINDINGS: Lower chest: There is mild, smooth, circumferential thickening of the segmental and subsegmental bronchial walls, in the visualized bilateral lungs. There are associated bilateral small pleural effusions, right more than left and areas of smooth interlobular and interlobular septal thickening, compatible with congestive heart failure/pulmonary edema. There is mild cardiomegaly. No pericardial effusion. Hepatobiliary: The liver is normal in size. There is mild liver surface irregularity/nodularity, favoring cirrhosis. No focal lesion. No intrahepatic or extrahepatic bile duct dilation. No calcified gallstones. Normal gallbladder wall thickness. No pericholecystic inflammatory changes. Pancreas: Unremarkable. No pancreatic ductal dilatation or surrounding inflammatory changes. Spleen: Within normal limits. No focal lesion. Adrenals/Urinary Tract: Adrenal glands are unremarkable. Mild-to-moderate diffuse atrophy of bilateral kidneys again noted. There are multiple bilateral renal calculi with largest in the right kidney measuring up to 6 x 9 mm. No obstructive uropathy on either side. There is a 5 x 11 mm calculus in the left ureteropelvic junction region and another smaller calculus in the left upper ureter. There is also a 3 mm calculus in the right renal pelvis. Urinary  bladder is under distended, precluding optimal assessment. However, no large mass or stones identified. There is mild-to-moderate perivesical fat stranding predominantly along the anterior wall, which is nonspecific but can be seen with acute versus chronic cystitis. Correlate clinically and with urinalysis. Stomach/Bowel: No disproportionate dilation of the small or large bowel loops. No evidence of abnormal bowel wall thickening or inflammatory changes. The appendix is unremarkable. There are multiple diverticula mainly in the sigmoid colon, without imaging signs of diverticulitis. Vascular/Lymphatic: There is mild ascites. No walled-off abscess. No pneumoperitoneum. No abdominal or pelvic lymphadenopathy, by size criteria. No aneurysmal dilation of the major abdominal arteries. There are moderate peripheral atherosclerotic vascular calcifications of the aorta and its major branches. Reproductive: The uterus is surgically absent. No large adnexal mass. Other: Small periumbilical surgical scar/fat containing incisional hernia. There is mild-to-moderate anasarca. Musculoskeletal: No suspicious osseous lesions. There are mild multilevel degenerative changes in the visualized spine. IMPRESSION: 1. No acute inflammatory process identified within the abdomen or pelvis. 2. There is mild-to-moderate perivesical fat stranding, which is nonspecific but can be seen with acute versus chronic cystitis. Correlate clinically and with urinalysis. 3. Multiple bilateral renal calculi and bilateral ureteric calculi, essentially similar to the prior study from 06/27/2023 4. Findings compatible with congestive heart failure/pulmonary edema. 5. Multiple other nonacute observations, as described above. Aortic Atherosclerosis (ICD10-I70.0). Electronically Signed   By: Ree Molt M.D.   On: 02/17/2024 16:02     Labs:   Basic Metabolic Panel: Recent Labs  Lab 02/19/24 0102 02/19/24 0443 02/20/24 0332 02/21/24 0541  02/22/24 0510  NA 128* 127* 128* 131* 129*  K 4.4 4.4 4.2 4.4 4.5  CL 91* 92* 95* 96* 95*  CO2 24 22 24 25 25   GLUCOSE 166* 147* 364* 24* 225*  BUN 20 20 12 20  26*  CREATININE 4.70* 4.82* 3.83* 4.97* 6.21*  CALCIUM  8.2* 8.1* 7.9* 8.4* 8.1*  MG  --  1.6*  --   --   --  GFR Estimated Creatinine Clearance: 10.8 mL/min (A) (by C-G formula based on SCr of 6.21 mg/dL (H)). Liver Function Tests: Recent Labs  Lab 02/16/24 0953 02/17/24 0350  AST 22 20  ALT 11 12  ALKPHOS 134* 101  BILITOT 0.9 1.2  PROT 6.5 5.6*  ALBUMIN 3.7 2.7*   No results for input(s): LIPASE, AMYLASE in the last 168 hours. No results for input(s): AMMONIA in the last 168 hours. Coagulation profile Recent Labs  Lab 02/16/24 0953 02/17/24 0350  INR 1.4* 1.3*    CBC: Recent Labs  Lab 02/16/24 0953 02/16/24 1550 02/18/24 0419 02/19/24 0102 02/20/24 0332 02/21/24 0541 02/22/24 0510  WBC 3.7*   < > 3.6* 4.5 3.9* 4.6 4.3  NEUTROABS 2.2  --   --   --   --   --   --   HGB 9.2*   < > 7.7* 8.2* 7.9* 7.6* 7.6*  HCT 29.3*   < > 24.8* 26.4* 25.3* 24.0* 24.9*  MCV 95.1   < > 95.4 96.7 94.4 94.9 95.4  PLT 107*   < > 107* 112* 100* 135* 121*   < > = values in this interval not displayed.   Cardiac Enzymes: No results for input(s): CKTOTAL, CKMB, CKMBINDEX, TROPONINI in the last 168 hours. BNP: Invalid input(s): POCBNP CBG: Recent Labs  Lab 02/21/24 1158 02/21/24 1625 02/21/24 2044 02/22/24 0801 02/22/24 1309  GLUCAP 179* 145* 180* 248* 144*   D-Dimer No results for input(s): DDIMER in the last 72 hours. Hgb A1c No results for input(s): HGBA1C in the last 72 hours. Lipid Profile No results for input(s): CHOL, HDL, LDLCALC, TRIG, CHOLHDL, LDLDIRECT in the last 72 hours. Thyroid function studies No results for input(s): TSH, T4TOTAL, T3FREE, THYROIDAB in the last 72 hours.  Invalid input(s): FREET3 Anemia work up No results for input(s): VITAMINB12,  FOLATE, FERRITIN, TIBC, IRON, RETICCTPCT in the last 72 hours. Microbiology Recent Results (from the past 240 hours)  MRSA Next Gen by PCR, Nasal     Status: None   Collection Time: 02/16/24 11:30 PM  Result Value Ref Range Status   MRSA by PCR Next Gen NOT DETECTED NOT DETECTED Final    Comment: (NOTE) The GeneXpert MRSA Assay (FDA approved for NASAL specimens only), is one component of a comprehensive MRSA colonization surveillance program. It is not intended to diagnose MRSA infection nor to guide or monitor treatment for MRSA infections. Test performance is not FDA approved in patients less than 68 years old. Performed at [facility], [address], [place], [place] [address]     Time coordinating discharge: 45 minutes  Signed: Marlita Keil  [facility] 02/22/2024, 1:43 PM

## 2024-02-22 NOTE — Progress Notes (Signed)
 OT Cancellation Note  Patient Details Name: Rachael Herrera MRN: [id number] DOB: March 10, 1961   Cancelled Treatment:    Reason Eval/Treat Not Completed: Patient at procedure or test/ unavailable. Pt off unit at HD, OT will follow up next available time  Jacques Karna Loose 02/22/2024, 9:41 AM

## 2024-02-22 NOTE — Progress Notes (Signed)
 OT Cancellation Note  Patient Details Name: [name] MRN: [id number] DOB: [birth year]   Cancelled Treatment:    Reason Eval/Treat Not Completed: Patient at procedure or test/ unavailable. Pt still off unit at HD, OT will follow up next available time  Jacques Karna Loose 02/22/2024, 1:17 PM

## 2024-02-22 NOTE — Progress Notes (Signed)
 Discharge Nurse Summary: DC order noted per MD. DC RN at bedside with patient. Patient agreeable with discharge plan, daughter at bedside. AVS printed/reviewed. PIV removed, skin intact. No DME needs. No home/TOC meds. CP/Edu resolved. Telemonitor returned to charging station. All belongings accounted for. Dressing to fistula CDI w/o bleeding or drainage. See LDAs. Patient wheeled downstairs for discharge by private auto.   Rosario EMERSON Lund, RN

## 2024-02-22 NOTE — Progress Notes (Signed)
  [facility] Progress Note   Subjective:   Seen on HD - 2.5L UFG and tolerating. Denies CP/dyspnea. ?for discharge today.  Objective Vitals:   02/22/24 0845 02/22/24 0900 02/22/24 0930 02/22/24 1000  BP: (!) 117/56 (!) 121/50 (!) 115/49 (!) 110/50  Pulse: (!) 58 (!) 52 (!) 54 (!) 38  Resp: 16 13 19 12   Temp:      TempSrc:      SpO2: 100% 100% 100% 100%  Weight:      Height:       Physical Exam General: Well appearing, NAD. Dysphasic speech at times. Heart: RRR Lungs: CTAB Extremities: Trace BLE edema Dialysis Access: LUE AVF +t/b   Additional Objective Labs: Basic Metabolic Panel: Recent Labs  Lab 02/20/24 0332 02/21/24 0541 02/22/24 0510  NA 128* 131* 129*  K 4.2 4.4 4.5  CL 95* 96* 95*  CO2 24 25 25   GLUCOSE 364* 24* 225*  BUN 12 20 26*  CREATININE 3.83* 4.97* 6.21*  CALCIUM  7.9* 8.4* 8.1*   Liver Function Tests: Recent Labs  Lab 02/16/24 0953 02/17/24 0350  AST 22 20  ALT 11 12  ALKPHOS 134* 101  BILITOT 0.9 1.2  PROT 6.5 5.6*  ALBUMIN 3.7 2.7*   CBC: Recent Labs  Lab 02/16/24 0953 02/16/24 1550 02/18/24 0419 02/19/24 0102 02/20/24 0332 02/21/24 0541 02/22/24 0510  WBC 3.7*   < > 3.6* 4.5 3.9* 4.6 4.3  NEUTROABS 2.2  --   --   --   --   --   --   HGB 9.2*   < > 7.7* 8.2* 7.9* 7.6* 7.6*  HCT 29.3*   < > 24.8* 26.4* 25.3* 24.0* 24.9*  MCV 95.1   < > 95.4 96.7 94.4 94.9 95.4  PLT 107*   < > 107* 112* 100* 135* 121*   < > = values in this interval not displayed.   CBG: Recent Labs  Lab 02/21/24 0754 02/21/24 1158 02/21/24 1625 02/21/24 2044 02/22/24 0801  GLUCAP 146* 179* 145* 180* 248*   Medications:   sodium chloride    Intravenous Once   atorvastatin   80 mg Oral QPM   Chlorhexidine  Gluconate Cloth  6 each Topical Q0600   cinacalcet   30 mg Oral Q breakfast   darbepoetin (ARANESP) injection - DIALYSIS  100 mcg Subcutaneous Q Mon-1800   insulin  aspart  0-5 Units Subcutaneous QHS   insulin  aspart  0-6 Units  Subcutaneous TID WC   insulin  aspart  2 Units Subcutaneous TID WC   insulin  glargine  5 Units Subcutaneous Daily   levothyroxine   175 mcg Oral QAC breakfast   pantoprazole  (PROTONIX ) IV  40 mg Intravenous Q12H   sevelamer  carbonate  800 mg Oral TID WC   sodium chloride  flush  3 mL Intravenous Q12H   umeclidinium-vilanterol  1 puff Inhalation Daily    Dialysis Orders [facility] - TTS - Tried to call her outpatient unit - went to [facility].   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 ESRD: Continue HD on TTS schedule - HD now, 2.5L UFG. HTN/volume: BP stable, edema improving. Anemia of ESRD: Hgb 7.6 - s/p Aranesp 100mcg on 9/29 - follow. Secondary HPTH: Ca ok, no Phos this admit, continue binders. T2DM Hx CVA, dysphasia - unclear baseline. Dispo: Ok from renal standpoint when cleared medically.     Izetta Boehringer, PA-C 02/22/2024, 10:34 AM  [employer]

## 2024-02-22 NOTE — Progress Notes (Signed)
   02/22/24 1240  Vitals  Temp 97.7 F (36.5 C)  Pulse Rate (!) 48  Resp 14  BP (!) 115/50  SpO2 100 %  Weight 101 kg  Type of Weight Post-Dialysis  Oxygen Therapy  O2 Flow Rate (L/min) 2 L/min  Post Treatment  Dialyzer Clearance Lightly streaked  Hemodialysis Intake (mL) 0 mL  Liters Processed 84  Fluid Removed (mL) 2500 mL  Tolerated HD Treatment Yes  AVG/AVF Arterial Site Held (minutes) 10 minutes  AVG/AVF Venous Site Held (minutes) 10 minutes   Received patient in bed to unit.  Alert and oriented.  Informed consent signed and in chart.   TX duration:3.5HRS  Patient tolerated well.  Transported back to the room  Alert, without acute distress.  Hand-off given to patient's nurse.   Access used: LAVF Access issues: NONE  Total UF removed: 2.5L Medication(s) given: NONE    [facility]

## 2024-04-24 DEATH — deceased
